# Patient Record
Sex: Female | Born: 1960 | Race: White | Hispanic: No | Marital: Married | Smoking: Never smoker
Health system: Southern US, Community
[De-identification: ages and names within clinical notes are randomized; demographics above are authoritative.]

## PROBLEM LIST (undated history)

## (undated) DIAGNOSIS — D229 Melanocytic nevi, unspecified: Secondary | ICD-10-CM

## (undated) DIAGNOSIS — G8929 Other chronic pain: Secondary | ICD-10-CM

## (undated) DIAGNOSIS — E119 Type 2 diabetes mellitus without complications: Secondary | ICD-10-CM

## (undated) DIAGNOSIS — M5416 Radiculopathy, lumbar region: Secondary | ICD-10-CM

## (undated) DIAGNOSIS — G5790 Unspecified mononeuropathy of unspecified lower limb: Secondary | ICD-10-CM

## (undated) DIAGNOSIS — J449 Chronic obstructive pulmonary disease, unspecified: Secondary | ICD-10-CM

## (undated) DIAGNOSIS — M549 Dorsalgia, unspecified: Secondary | ICD-10-CM

## (undated) DIAGNOSIS — F319 Bipolar disorder, unspecified: Secondary | ICD-10-CM

## (undated) DIAGNOSIS — M51369 Other intervertebral disc degeneration, lumbar region without mention of lumbar back pain or lower extremity pain: Secondary | ICD-10-CM

## (undated) DIAGNOSIS — M1990 Unspecified osteoarthritis, unspecified site: Secondary | ICD-10-CM

## (undated) DIAGNOSIS — M5030 Other cervical disc degeneration, unspecified cervical region: Secondary | ICD-10-CM

## (undated) DIAGNOSIS — L989 Disorder of the skin and subcutaneous tissue, unspecified: Secondary | ICD-10-CM

## (undated) DIAGNOSIS — I10 Essential (primary) hypertension: Secondary | ICD-10-CM

## (undated) DIAGNOSIS — N281 Cyst of kidney, acquired: Secondary | ICD-10-CM

## (undated) DIAGNOSIS — B029 Zoster without complications: Secondary | ICD-10-CM

## (undated) DIAGNOSIS — K828 Other specified diseases of gallbladder: Secondary | ICD-10-CM

## (undated) DIAGNOSIS — E785 Hyperlipidemia, unspecified: Secondary | ICD-10-CM

## (undated) DIAGNOSIS — K219 Gastro-esophageal reflux disease without esophagitis: Secondary | ICD-10-CM

## (undated) DIAGNOSIS — F329 Major depressive disorder, single episode, unspecified: Secondary | ICD-10-CM

## (undated) DIAGNOSIS — F32A Depression, unspecified: Secondary | ICD-10-CM

## (undated) DIAGNOSIS — M542 Cervicalgia: Secondary | ICD-10-CM

## (undated) DIAGNOSIS — M5136 Other intervertebral disc degeneration, lumbar region: Secondary | ICD-10-CM

## (undated) HISTORY — DX: Depression, unspecified: F32.A

## (undated) HISTORY — DX: Chronic obstructive pulmonary disease, unspecified: J44.9

## (undated) HISTORY — DX: Type 2 diabetes mellitus without complications: E11.9

## (undated) HISTORY — DX: Hyperlipidemia, unspecified: E78.5

## (undated) HISTORY — PX: ABDOMINAL HYSTERECTOMY: SHX81

## (undated) HISTORY — DX: Unspecified mononeuropathy of unspecified lower limb: G57.90

## (undated) HISTORY — DX: Essential (primary) hypertension: I10

## (undated) HISTORY — DX: Bipolar disorder, unspecified: F31.9

## (undated) HISTORY — DX: Unspecified osteoarthritis, unspecified site: M19.90

## (undated) HISTORY — DX: Gastro-esophageal reflux disease without esophagitis: K21.9

## (undated) HISTORY — DX: Disorder of the skin and subcutaneous tissue, unspecified: L98.9

## (undated) HISTORY — DX: Other cervical disc degeneration, unspecified cervical region: M50.30

## (undated) HISTORY — DX: Melanocytic nevi, unspecified: D22.9

## (undated) HISTORY — DX: Zoster without complications: B02.9

## (undated) HISTORY — DX: Other intervertebral disc degeneration, lumbar region without mention of lumbar back pain or lower extremity pain: M51.369

## (undated) HISTORY — DX: Other specified diseases of gallbladder: K82.8

## (undated) HISTORY — PX: NECK SURGERY: SHX720

## (undated) HISTORY — DX: Cyst of kidney, acquired: N28.1

## (undated) HISTORY — DX: Major depressive disorder, single episode, unspecified: F32.9

## (undated) HISTORY — DX: Other intervertebral disc degeneration, lumbar region: M51.36

## (undated) HISTORY — PX: BACK SURGERY: SHX140

---

## 2002-07-09 ENCOUNTER — Emergency Department (HOSPITAL_COMMUNITY): Admission: EM | Admit: 2002-07-09 | Discharge: 2002-07-10 | Payer: Self-pay | Admitting: Emergency Medicine

## 2005-02-21 ENCOUNTER — Emergency Department (HOSPITAL_COMMUNITY): Admission: EM | Admit: 2005-02-21 | Discharge: 2005-02-21 | Payer: Self-pay | Admitting: Emergency Medicine

## 2006-10-10 ENCOUNTER — Emergency Department (HOSPITAL_COMMUNITY): Admission: EM | Admit: 2006-10-10 | Discharge: 2006-10-10 | Payer: Self-pay | Admitting: Emergency Medicine

## 2007-06-06 ENCOUNTER — Emergency Department (HOSPITAL_COMMUNITY): Admission: EM | Admit: 2007-06-06 | Discharge: 2007-06-06 | Payer: Self-pay | Admitting: Emergency Medicine

## 2007-07-22 ENCOUNTER — Inpatient Hospital Stay (HOSPITAL_COMMUNITY): Admission: RE | Admit: 2007-07-22 | Discharge: 2007-07-24 | Payer: Self-pay | Admitting: Obstetrics & Gynecology

## 2007-07-22 ENCOUNTER — Encounter: Payer: Self-pay | Admitting: Obstetrics & Gynecology

## 2007-12-10 ENCOUNTER — Emergency Department (HOSPITAL_COMMUNITY): Admission: EM | Admit: 2007-12-10 | Discharge: 2007-12-10 | Payer: Self-pay | Admitting: Emergency Medicine

## 2007-12-24 ENCOUNTER — Encounter
Admission: RE | Admit: 2007-12-24 | Discharge: 2008-01-14 | Payer: Self-pay | Admitting: Physical Medicine and Rehabilitation

## 2008-01-12 ENCOUNTER — Emergency Department (HOSPITAL_COMMUNITY): Admission: EM | Admit: 2008-01-12 | Discharge: 2008-01-12 | Payer: Self-pay | Admitting: Emergency Medicine

## 2008-01-21 ENCOUNTER — Ambulatory Visit (HOSPITAL_COMMUNITY)
Admission: RE | Admit: 2008-01-21 | Discharge: 2008-01-21 | Payer: Self-pay | Admitting: Physical Medicine and Rehabilitation

## 2008-04-21 ENCOUNTER — Emergency Department (HOSPITAL_COMMUNITY): Admission: EM | Admit: 2008-04-21 | Discharge: 2008-04-21 | Payer: Self-pay | Admitting: Emergency Medicine

## 2008-05-26 ENCOUNTER — Ambulatory Visit (HOSPITAL_COMMUNITY): Admission: RE | Admit: 2008-05-26 | Discharge: 2008-05-26 | Payer: Self-pay | Admitting: Obstetrics and Gynecology

## 2008-06-02 ENCOUNTER — Ambulatory Visit (HOSPITAL_COMMUNITY): Admission: RE | Admit: 2008-06-02 | Discharge: 2008-06-03 | Payer: Self-pay | Admitting: Orthopedic Surgery

## 2008-12-08 ENCOUNTER — Inpatient Hospital Stay (HOSPITAL_COMMUNITY): Admission: RE | Admit: 2008-12-08 | Discharge: 2008-12-10 | Payer: Self-pay | Admitting: Orthopedic Surgery

## 2009-02-15 ENCOUNTER — Emergency Department (HOSPITAL_COMMUNITY): Admission: EM | Admit: 2009-02-15 | Discharge: 2009-02-15 | Payer: Self-pay | Admitting: Emergency Medicine

## 2009-02-22 LAB — HM DEXA SCAN

## 2009-04-17 ENCOUNTER — Emergency Department (HOSPITAL_COMMUNITY): Admission: EM | Admit: 2009-04-17 | Discharge: 2009-04-17 | Payer: Self-pay | Admitting: Emergency Medicine

## 2009-05-09 ENCOUNTER — Emergency Department (HOSPITAL_COMMUNITY): Admission: EM | Admit: 2009-05-09 | Discharge: 2009-05-09 | Payer: Self-pay | Admitting: Emergency Medicine

## 2009-06-06 ENCOUNTER — Ambulatory Visit (HOSPITAL_COMMUNITY): Admission: RE | Admit: 2009-06-06 | Discharge: 2009-06-06 | Payer: Self-pay | Admitting: Orthopedic Surgery

## 2009-07-04 ENCOUNTER — Ambulatory Visit (HOSPITAL_COMMUNITY): Admission: RE | Admit: 2009-07-04 | Discharge: 2009-07-04 | Payer: Self-pay | Admitting: Family Medicine

## 2010-01-06 ENCOUNTER — Emergency Department (HOSPITAL_COMMUNITY): Admission: EM | Admit: 2010-01-06 | Discharge: 2010-01-06 | Payer: Self-pay | Admitting: Emergency Medicine

## 2010-01-26 ENCOUNTER — Encounter: Payer: Self-pay | Admitting: Gastroenterology

## 2010-02-19 ENCOUNTER — Ambulatory Visit: Payer: Self-pay | Admitting: Gastroenterology

## 2010-03-05 ENCOUNTER — Ambulatory Visit: Payer: Self-pay | Admitting: Gastroenterology

## 2010-03-05 LAB — HM COLONOSCOPY: HM Colonoscopy: NORMAL

## 2010-03-06 LAB — HM DIABETES EYE EXAM

## 2010-04-13 NOTE — Op Note (Signed)
NAMESHRON, Charlotte Perez              ACCOUNT NO.:  [id number]  MEDICAL RECORD NO.:  [id number]          PATIENT TYPE:  INP  LOCATION:  5010                         FACILITY:  [facility]  PHYSICIAN:  Alvy Beal, MD    DATE OF BIRTH:  02/17/1961  DATE OF PROCEDURE: DATE OF DISCHARGE:                              OPERATIVE REPORT  PREOPERATIVE DIAGNOSIS:  Degenerative lumbar disk disease at L5-S1 with diskogenic low back pain and radicular right leg pain.  POSTOPERATIVE DIAGNOSIS:  Degenerative lumbar disk disease at L5-S1 with diskogenic low back pain and radicular right leg pain.  PROCEDURE:  Transforaminal lumbar interbody fusion at L5-S1.  COMPLICATIONS:  None.  INSTRUMENTATION USED:  Minimally invasive TLIF system with a 10 x 9 interbody cage, packed with Actifuse, 40-mm L5 pedicle screws, 35-mm S1 pedicle screws, evoked running EMGs and neuro monitoring remained normal throughout the case.  FIRST ASSISTANT:  Crissie Reese, PA.  HISTORY:  This is a very pleasant 50 year old woman who presents with complaints of severe debilitating low back pain.  The patient had clinical and radiographic analysis consistent with degenerative lumbar disk disease with diskogenic back pain and radicular right leg pain. After discussing treatment options, she elected to proceed with surgery. All appropriate risks, benefits, and alternatives were discussed and consent was obtained.  OPERATIVE NOTE:  The patient was brought to the operating room and placed supine on the operating table.  After successful induction of general anesthesia and endotracheal intubation, TEDs, SCDs, and Foley were inserted.  The patient was then turned prone onto a Wilson frame. All bony prominences were well padded.  The back was prepped and draped in a standard fashion.  It should be noted that prior to prepping and draping, all appropriate neuro monitoring needles were positioned for intraoperative  neuro monitoring.  Once the patient was prepped and draped, an appropriate time-out was done confirming the appropriate patient, procedure, and the extremity that was affected.  Once this was done, I used x-ray to identify the lateral border of the L5 pedicle.  Once done, I made a stab incision at this well and advanced the percutaneous Jamshidi needle down to the lateral aspect of spine.  I was able to palpate the lateral recess and traced it back up to where it contacted the facet joint.  I confirmed satisfactory position in AP and lateral fluoroscopy.  I advanced the Jamshidi needle through the cortical bone into the pedicle.  On the AP view, I advanced it to the medial wall, so I was near the medial wall of the pedicle and then I took the lateral x-ray.  At this point, I was already at the posterior vertebral body pedicle junction.  I then advanced the probe through the posterior vertebral body.  I then advanced the guide pin down the Jamshidi needle and then removed the Jamshidi needle.  I then repeated this entire procedure at the S1 level. Again using AP and lateral fluoroscopy and neuro monitoring while the Jamshidi was going in, there were no adverse events.  Once both guide pins were properly positioned, I then connected it by a skin incision and  then dissected down to the deep fascia.  I incised the deep fascia in line with the guide pins and then made a relaxing horizontal incision and the fascia at the midpoint between the 2 guide pin.  I then began dissecting the paraspinal muscles, so that I could palpate and visualize the lamina and facet complex.  Once I had adequate mobilized the soft tissue, I then tapped again using neuro monitoring.  Again the tapping under fluoroscopic guidance and with monitoring, demonstrated satisfactory trajectory position.  I then placed the 40-mm screw at L5 which was attached to the 70-mm retractor blade at S1 and it was a same size screw.   These were the temporary 5.5 diameter screws.  Once I had the screws properly positioning confirmed with x-ray in both planes, I then proceeded with the decompression.  I first attached the retracting device to the blades and then secured it via the arm to the table.  Once I had the blades properly positioned, I expanded them in the cranial caudal direction and then I gently distracted.  At this point, I placed my medial retractor blade and retracted the medial soft tissue.  I then used a pituitary rongeur to remove the remaining strands of muscular tissue, so I could expose the lamina and L5-S1 facet complex.  At this point, I had an excellent visualization of the lateral aspect of the posterior spine.  I then took an osteotome, placed it over the pars interarticularis and then gently divided the cortex.  I then went down and split the inferior L5 facet complex with the osteotome and removed that.  At this point, I could visualize the S1 superior facet complex.  I then used a fine curette to develop a plane underneath the lamina of S1 and I performed a S1 laminectomy.  I then at this point resected the ligamentum flavum and I could clearly visualize the lateral aspect of the thecal sac.  I then dissected down underneath the S1 superior facet and removed the osteophytes, so that I could see the medial and superior aspect of the pedicle.  I then went inferiorly and traced the S1 nerve root down to the neural foramen and did a foraminotomy.  At this point, I could clearly take a dural elevator [facility] and pass it in the lateral recess medial to the S1 pedicle and out the neural foramen.  At this point, I then began resecting the remaining portion of the pars interarticularis until I could completely visualize the exiting L5 nerve root.  I then went superiorly and removed the remaining osteophytes, so that I could again visualize the inferior aspect of the L5 pedicle.  At this  point having adequately decompressed laterally and craniocaudally, I had an excellent visualization of the L5-S1 disk space.  I gently resected the thecal sac and coagulated the large epidural veins with bipolar electrocautery.  I then incised the annulus with a 15 blade scalpel and then using a combination of endplate scrapers, pituitary rongeurs, curettes, and Kerrison rongeurs, I removed the entire L5-S1 disk material.  I then using endplate scrapers to rasp the endplates of L5 and S1, so that I had adequate bleeding bone.  At this point, I trialed the 9 and 10 spacer and a 10 gave me adequate fixation.  I then took the bone that I had harvested locally, morselized it and put in the anterior aspect of the interbody space.  I mixed this with some Actifuse.  I then took  the 10 x 9 bulleted TLIF cage, packed with Actifuse and then malleted it to the appropriate depth.  I then pushed it across so that it was midline and sat horizontal in the anterior third of the vertebral body.  At this point, the AP and lateral x-rays demonstrated satisfactory position of the cage and graft material.  With the decompression complete, I then level took the Wilson frame intraoperative, so that there was minimal kyphosis.  At this point, I then measured the space within the rod and placed a 20-mm rods and secured it to the pedicle screws.  Once I did it, I did check the pedicle directly and I was satisfied that I had not breach medially or inferiorly at L5 or medially or superiorly or inferiorly at S1.  At this point, I placed a thrombin-soaked Gelfoam patty over the exposed thecal sac and then closed the deep fascia with interrupted zero Vicryl suture, superficial with 2-0 Vicryl sutures, and 3-0 Monocryl for the skin.  I went to the contralateral side and then used x-ray to identify the lateral border of the L5 pedicle.  Once I was able to identify this border, I made a stab incision, advanced the  Jamshidi down and then I made incision.  I then identified the lateral border of the S1 pedicle and made a second incision.  I connected these 2 and then I dissected down to the deep fascia and incised the deep fascia and then began using a Cobb elevator to split the paraspinal muscles until I could see the L5- S1 facet complex.  I then used a Cobb elevator to scrape the facet capsule from L5-S1.  At this point, I advanced the Jamshidi needle back down and I could easily palpate the lateral mass of L5 and I placed this on the lateral aspect with a facet and transverse process merge.  I then advanced the Jamshidi through the pedicle.  In the AP and lateral planes, I had satisfactory position and neuro monitoring remained within normal limits.  I then placed the guide pin and then removed the Jamshidi needle and repeated this at S1.  Once both needles were in, I then took an osteotome and then roughened up the facet complex at L5-S1.  I then took the remaining autograft bone that I had, mixed with the Actifuse and packed it in the posterolateral and over the facet complex.  I then tapped again using live fluoro in both the AP and lateral planes and neuro monitoring remained normal. Once I tapped, I then placed the screws.  Also, during screw insertion, there was no abnormal neural neurodiagnostic event and based on x-ray had adequate position.  I then measured the rod, placed it and secured it and torqued it down appropriately.  At this point, I then removed the inserting devices, irrigated the wound, closed the deep fascia in a similar fashion with interrupted 0 Vicryl suture, superficial with 2-0 Vicryl sutures, and 3- 0 Monocryl for the skin.  Steri-Strips and dry dressings were placed on the wounds.  The patient was extubated and transferred to PACU without any incident.  At the end of the case, all needle and sponge counts were correct.  There were no adverse neurodiagnostic events  and the hardware appeared satisfactory in both the AP and lateral planes and with direct visualization.     Alvy Beal, MD Electronically Signed    DDB/MEDQ  D:  12/08/2008  T:  12/09/2008  Job:  [id number]  Electronically Signed by Venita Lick MD on 04/12/2010 08:45:56 PM

## 2010-04-24 NOTE — Miscellaneous (Signed)
Summary: DIRECT COLON SCREEN-AGE/YF  Clinical Lists Changes  Medications: Added new medication of TRILYTE 420 GM  SOLR (PEG 3350-KCL-NA BICARB-NACL) As per prep instructions. - Signed Rx of TRILYTE 420 GM  SOLR (PEG 3350-KCL-NA BICARB-NACL) As per prep instructions.;  #1 x 0;  Signed;  Entered by: Clide Cliff RN;  Authorized by: Mardella Layman MD [facility];  Method used: Electronically to [facility] [address], [place], [place], [place]  [address], Ph: [phone], Fax: [phone] Observations: Added new observation of ALLERGY REV: Done (02/19/2010 15:03)    Prescriptions: TRILYTE 420 GM  SOLR (PEG 3350-KCL-NA BICARB-NACL) As per prep instructions.  #1 x 0   Entered by:   Clide Cliff RN   Authorized by:   Mardella Layman MD [facility]   Signed by:   Clide Cliff RN on 02/19/2010   Method used:   Electronically to        [facility]* (retail)       [address]       [place], [place]  [address]       Ph: [phone]       Fax: [phone]   RxID:   [id number]

## 2010-04-24 NOTE — Letter (Signed)
Summary: Pre Visit Letter Revised  [facility]  [address] [place], [place] [address]   Phone: [phone]  Fax: [phone]        01/26/2010 MRN: [id number]  Charlotte Perez [address] [place], [place]  [address]             Procedure Date:  12-12 at 11:30am            Dr Jarold Motto   Welcome to the Gastroenterology Division at [facility].    You are scheduled to see a nurse for your pre-procedure visit on 02-19-10 at 4pm on the 3rd floor at [facility], [address]  We ask that you try to arrive at our office 15 minutes prior to your appointment time to allow for check-in.  Please take a minute to review the attached form.  If you answer "Yes" to one or more of the questions on the first page, we ask that you call the person listed at your earliest opportunity.  If you answer "No" to all of the questions, please complete the rest of the form and bring it to your appointment.    Your nurse visit will consist of discussing your medical and surgical history, your immediate family medical history, and your medications.   If you are unable to list all of your medications on the form, please bring the medication bottles to your appointment and we will list them.  We will need to be aware of both prescribed and over the counter drugs.  We will need to know exact dosage information as well.    Please be prepared to read and sign documents such as consent forms, a financial agreement, and acknowledgement forms.  If necessary, and with your consent, a friend or relative is welcome to sit-in on the nurse visit with you.  Please bring your insurance card so that we may make a copy of it.  If your insurance requires a referral to see a specialist, please bring your referral form from your primary care physician.  No co-pay is required for this nurse visit.     If you cannot keep your appointment, please call [phone] to cancel or reschedule prior to your  appointment date.  This allows [place] the opportunity to schedule an appointment for another patient in need of care.    Thank you for choosing [facility] for your medical needs.  We appreciate the opportunity to care for you.  Please visit [place] at our website  to learn more about our practice.  Sincerely, The Gastroenterology Division

## 2010-04-26 NOTE — Procedures (Signed)
Summary: Colonoscopy  Patient: Charlotte Perez Note: All result statuses are Final unless otherwise noted.  Tests: (1) Colonoscopy (COL)   COL Colonoscopy           DONE     [facility]     [address]     [place], [place]  [address]           COLONOSCOPY PROCEDURE REPORT           PATIENT:  Charlotte Perez, Charlotte Perez  MR#:  [id number]     BIRTHDATE:  10-20-1960, 49 yrs. old  GENDER:  female     ENDOSCOPIST:  Vania Rea. Jarold Motto, MD, [facility]     REF. BY:  Rudi Heap, M.D.     PROCEDURE DATE:  03/05/2010     PROCEDURE:  Higher-risk screening colonoscopy G0105           ASA CLASS:  Class II     INDICATIONS:  Elevated Risk Screening, family history of colon     cancer, family Hx of polyps     MEDICATIONS:   Fentanyl 50 mcg IV, Versed 6 mg IV           DESCRIPTION OF PROCEDURE:   After the risks benefits and     alternatives of the procedure were thoroughly explained, informed     consent was obtained.  Digital rectal exam was performed and     revealed no abnormalities.   The LB 180AL [id number] endoscope was     introduced through the anus and advanced to the cecum, which was     identified by both the appendix and ileocecal valve, without     limitations.  The quality of the prep was Moviprep fair.  The     instrument was then slowly withdrawn as the colon was fully     examined.<<PROCEDUREIMAGES>>           FINDINGS:  No polyps or cancers were seen.  This was otherwise a     normal examination of the colon.   Retroflexed views in the rectum     revealed no abnormalities.    The scope was then withdrawn from     the patient and the procedure completed.           COMPLICATIONS:  None     ENDOSCOPIC IMPRESSION:     1) No polyps or cancers     2) Otherwise normal examination     RECOMMENDATIONS:     1) Given your significant family history of colon cancer, you     should have a repeat colonoscopy in 5 years     REPEAT EXAM:  No           ______________________________  Vania Rea. Jarold Motto, MD, [facility]           CC:           n.     eSIGNED:   Vania Rea. Patterson at 03/05/2010 12:14 PM           Satira Sark, [id number]  Note: An exclamation mark (!) indicates a result that was not dispersed into the flowsheet. Document Creation Date: 03/05/2010 12:14 PM _______________________________________________________________________  (1) Order result status: Final Collection or observation date-time: 03/05/2010 12:09 Requested date-time:  Receipt date-time:  Reported date-time:  Referring Physician:   Ordering Physician: Sheryn Bison [phone]) Specimen Source:  Source: Launa Grill Order Number: [id number] Lab site:   Appended Document: Colonoscopy    Clinical Lists Changes  Observations: Added new observation of COLONNXTDUE: 02/2015 (03/05/2010 15:31)      Appended Document: Colonoscopy recall     Procedures Next Due Date:    Colonoscopy: 03/2015  Appended Document: Colonoscopy Recall     Procedures Next Due Date:    Colonoscopy: 03/2015

## 2010-05-29 ENCOUNTER — Emergency Department (HOSPITAL_COMMUNITY)
Admission: EM | Admit: 2010-05-29 | Discharge: 2010-05-29 | Disposition: A | Discharge status: Home / Self Care | Payer: Medicaid Other | Attending: Emergency Medicine | Admitting: Emergency Medicine

## 2010-05-29 DIAGNOSIS — R51 Headache: Secondary | ICD-10-CM | POA: Insufficient documentation

## 2010-05-29 DIAGNOSIS — E119 Type 2 diabetes mellitus without complications: Secondary | ICD-10-CM | POA: Insufficient documentation

## 2010-05-29 DIAGNOSIS — M542 Cervicalgia: Secondary | ICD-10-CM | POA: Insufficient documentation

## 2010-05-29 DIAGNOSIS — Z79899 Other long term (current) drug therapy: Secondary | ICD-10-CM | POA: Insufficient documentation

## 2010-05-29 DIAGNOSIS — F3130 Bipolar disorder, current episode depressed, mild or moderate severity, unspecified: Secondary | ICD-10-CM | POA: Insufficient documentation

## 2010-05-29 DIAGNOSIS — J45909 Unspecified asthma, uncomplicated: Secondary | ICD-10-CM | POA: Insufficient documentation

## 2010-05-29 DIAGNOSIS — G8929 Other chronic pain: Secondary | ICD-10-CM | POA: Insufficient documentation

## 2010-06-05 LAB — GLUCOSE, CAPILLARY
Glucose-Capillary: 104 mg/dL — ABNORMAL HIGH (ref 70–99)
Glucose-Capillary: 93 mg/dL (ref 70–99)

## 2010-06-06 LAB — CBC
HCT: 33.3 % — ABNORMAL LOW (ref 36.0–46.0)
Hemoglobin: 11.5 g/dL — ABNORMAL LOW (ref 12.0–15.0)
MCH: 31 pg (ref 26.0–34.0)
MCHC: 34.5 g/dL (ref 30.0–36.0)
MCV: 90.1 fL (ref 78.0–100.0)
Platelets: 182 10*3/uL (ref 150–400)
RBC: 3.7 MIL/uL — ABNORMAL LOW (ref 3.87–5.11)
RDW: 12.8 % (ref 11.5–15.5)
WBC: 6.1 10*3/uL (ref 4.0–10.5)

## 2010-06-06 LAB — COMPREHENSIVE METABOLIC PANEL
ALT: 13 U/L (ref 0–35)
Albumin: 3.5 g/dL (ref 3.5–5.2)
Alkaline Phosphatase: 34 U/L — ABNORMAL LOW (ref 39–117)
Chloride: 102 mEq/L (ref 96–112)
Glucose, Bld: 111 mg/dL — ABNORMAL HIGH (ref 70–99)
Potassium: 4.4 mEq/L (ref 3.5–5.1)
Sodium: 136 mEq/L (ref 135–145)
Total Protein: 6.1 g/dL (ref 6.0–8.3)

## 2010-06-06 LAB — RAPID URINE DRUG SCREEN, HOSP PERFORMED
Amphetamines: NOT DETECTED
Barbiturates: NOT DETECTED
Benzodiazepines: NOT DETECTED
Cocaine: NOT DETECTED
Opiates: NOT DETECTED
Tetrahydrocannabinol: NOT DETECTED

## 2010-06-06 LAB — GLUCOSE, CAPILLARY: Glucose-Capillary: 104 mg/dL — ABNORMAL HIGH (ref 70–99)

## 2010-06-06 LAB — DIFFERENTIAL
Basophils Relative: 1 % (ref 0–1)
Eosinophils Absolute: 0.1 10*3/uL (ref 0.0–0.7)
Monocytes Absolute: 0.6 10*3/uL (ref 0.1–1.0)
Monocytes Relative: 9 % (ref 3–12)

## 2010-06-10 LAB — URINALYSIS, ROUTINE W REFLEX MICROSCOPIC
Bilirubin Urine: NEGATIVE
Glucose, UA: NEGATIVE mg/dL
Hgb urine dipstick: NEGATIVE
Ketones, ur: NEGATIVE mg/dL
pH: 7 (ref 5.0–8.0)

## 2010-06-13 LAB — URINE CULTURE

## 2010-06-13 LAB — COMPREHENSIVE METABOLIC PANEL
Alkaline Phosphatase: 46 U/L (ref 39–117)
BUN: 16 mg/dL (ref 6–23)
Calcium: 8.6 mg/dL (ref 8.4–10.5)
Glucose, Bld: 108 mg/dL — ABNORMAL HIGH (ref 70–99)
Potassium: 4 mEq/L (ref 3.5–5.1)
Total Protein: 6.1 g/dL (ref 6.0–8.3)

## 2010-06-13 LAB — URINALYSIS, ROUTINE W REFLEX MICROSCOPIC
Glucose, UA: NEGATIVE mg/dL
Nitrite: NEGATIVE
Protein, ur: NEGATIVE mg/dL
Urobilinogen, UA: 0.2 mg/dL (ref 0.0–1.0)

## 2010-06-13 LAB — DIFFERENTIAL
Basophils Relative: 0 % (ref 0–1)
Monocytes Relative: 2 % — ABNORMAL LOW (ref 3–12)
Neutro Abs: 7.4 10*3/uL (ref 1.7–7.7)
Neutrophils Relative %: 91 % — ABNORMAL HIGH (ref 43–77)

## 2010-06-13 LAB — CBC
HCT: 39.7 % (ref 36.0–46.0)
Hemoglobin: 13.7 g/dL (ref 12.0–15.0)
MCHC: 34.5 g/dL (ref 30.0–36.0)
RDW: 13 % (ref 11.5–15.5)

## 2010-06-13 LAB — LIPASE, BLOOD: Lipase: 17 U/L (ref 11–59)

## 2010-06-27 DIAGNOSIS — E1169 Type 2 diabetes mellitus with other specified complication: Secondary | ICD-10-CM | POA: Insufficient documentation

## 2010-06-27 DIAGNOSIS — I10 Essential (primary) hypertension: Secondary | ICD-10-CM | POA: Insufficient documentation

## 2010-06-27 DIAGNOSIS — E119 Type 2 diabetes mellitus without complications: Secondary | ICD-10-CM

## 2010-06-27 DIAGNOSIS — E1165 Type 2 diabetes mellitus with hyperglycemia: Secondary | ICD-10-CM | POA: Insufficient documentation

## 2010-06-27 DIAGNOSIS — E1159 Type 2 diabetes mellitus with other circulatory complications: Secondary | ICD-10-CM | POA: Insufficient documentation

## 2010-06-27 DIAGNOSIS — E785 Hyperlipidemia, unspecified: Secondary | ICD-10-CM

## 2010-06-27 DIAGNOSIS — F319 Bipolar disorder, unspecified: Secondary | ICD-10-CM

## 2010-06-27 HISTORY — DX: Type 2 diabetes mellitus without complications: E11.9

## 2010-06-29 LAB — BASIC METABOLIC PANEL
BUN: 5 mg/dL — ABNORMAL LOW (ref 6–23)
BUN: 5 mg/dL — ABNORMAL LOW (ref 6–23)
BUN: 12 mg/dL (ref 6–23)
CO2: 30 mEq/L (ref 19–32)
Chloride: 100 mEq/L (ref 96–112)
Chloride: 101 mEq/L (ref 96–112)
Creatinine, Ser: 0.59 mg/dL (ref 0.4–1.2)
Creatinine, Ser: 0.58 mg/dL (ref 0.4–1.2)
GFR calc non Af Amer: >60 mL/min (ref >60)
Glucose, Bld: 149 mg/dL — ABNORMAL HIGH (ref 70–99)
Glucose, Bld: 188 mg/dL — ABNORMAL HIGH (ref 70–99)
Glucose, Bld: 133 mg/dL — ABNORMAL HIGH (ref 70–99)
Potassium: 3.7 mEq/L (ref 3.5–5.1)

## 2010-06-29 LAB — GLUCOSE, CAPILLARY
Glucose-Capillary: 129 mg/dL — ABNORMAL HIGH (ref 70–99)
Glucose-Capillary: 142 mg/dL — ABNORMAL HIGH (ref 70–99)
Glucose-Capillary: 144 mg/dL — ABNORMAL HIGH (ref 70–99)
Glucose-Capillary: 151 mg/dL — ABNORMAL HIGH (ref 70–99)
Glucose-Capillary: 177 mg/dL — ABNORMAL HIGH (ref 70–99)

## 2010-06-29 LAB — CBC
HCT: 27.8 % — ABNORMAL LOW (ref 36.0–46.0)
HCT: 30.5 % — ABNORMAL LOW (ref 36.0–46.0)
Hemoglobin: 10.6 g/dL — ABNORMAL LOW (ref 12.0–15.0)
MCHC: 35 g/dL (ref 30.0–36.0)
MCV: 91.4 fL (ref 78.0–100.0)
MCV: 91.5 fL (ref 78.0–100.0)
MCV: 91.9 fL (ref 78.0–100.0)
Platelets: 128 10*3/uL — ABNORMAL LOW (ref 150–400)
Platelets: 132 10*3/uL — ABNORMAL LOW (ref 150–400)
Platelets: 195 10*3/uL (ref 150–400)
RDW: 12.6 % (ref 11.5–15.5)
RDW: 12.7 % (ref 11.5–15.5)
RDW: 13.1 % (ref 11.5–15.5)
WBC: 5.7 10*3/uL (ref 4.0–10.5)

## 2010-06-29 LAB — ABO/RH: ABO/RH(D): B POS

## 2010-06-29 LAB — TYPE AND SCREEN: ABO/RH(D): B POS

## 2010-07-05 LAB — BASIC METABOLIC PANEL
BUN: 10 mg/dL (ref 6–23)
BUN: 3 mg/dL — ABNORMAL LOW (ref 6–23)
CO2: 32 mEq/L (ref 19–32)
Chloride: 103 mEq/L (ref 96–112)
Chloride: 106 mEq/L (ref 96–112)
Creatinine, Ser: 0.57 mg/dL (ref 0.4–1.2)
Creatinine, Ser: 0.62 mg/dL (ref 0.4–1.2)
Glucose, Bld: 120 mg/dL — ABNORMAL HIGH (ref 70–99)
Glucose, Bld: 138 mg/dL — ABNORMAL HIGH (ref 70–99)
Potassium: 4 mEq/L (ref 3.5–5.1)
Potassium: 4.3 mEq/L (ref 3.5–5.1)

## 2010-07-05 LAB — DIFFERENTIAL
Basophils Absolute: 0.1 10*3/uL (ref 0.0–0.1)
Basophils Relative: 1 % (ref 0–1)
Eosinophils Absolute: 0 10*3/uL (ref 0.0–0.7)
Eosinophils Relative: 0 % (ref 0–5)
Lymphs Abs: 1.5 10*3/uL (ref 0.7–4.0)
Neutrophils Relative %: 63 % (ref 43–77)

## 2010-07-05 LAB — CBC
HCT: 37.8 % (ref 36.0–46.0)
HCT: 38.5 % (ref 36.0–46.0)
MCHC: 35.7 g/dL (ref 30.0–36.0)
MCV: 93.1 fL (ref 78.0–100.0)
MCV: 93.1 fL (ref 78.0–100.0)
Platelets: 200 10*3/uL (ref 150–400)
Platelets: 214 10*3/uL (ref 150–400)
RDW: 13.4 % (ref 11.5–15.5)
RDW: 13.5 % (ref 11.5–15.5)
WBC: 6 10*3/uL (ref 4.0–10.5)
WBC: 7.8 10*3/uL (ref 4.0–10.5)

## 2010-07-05 LAB — GLUCOSE, CAPILLARY
Glucose-Capillary: 113 mg/dL — ABNORMAL HIGH (ref 70–99)
Glucose-Capillary: 137 mg/dL — ABNORMAL HIGH (ref 70–99)
Glucose-Capillary: 168 mg/dL — ABNORMAL HIGH (ref 70–99)

## 2010-07-09 LAB — URINALYSIS, ROUTINE W REFLEX MICROSCOPIC
Glucose, UA: 250 mg/dL — AB
Hgb urine dipstick: NEGATIVE
Ketones, ur: NEGATIVE mg/dL
Protein, ur: NEGATIVE mg/dL
Urobilinogen, UA: 0.2 mg/dL (ref 0.0–1.0)

## 2010-07-09 LAB — CBC
HCT: 40.7 % (ref 36.0–46.0)
MCHC: 34.9 g/dL (ref 30.0–36.0)
MCV: 93.2 fL (ref 78.0–100.0)
Platelets: 198 10*3/uL (ref 150–400)
RBC: 4.36 MIL/uL (ref 3.87–5.11)
WBC: 5.3 10*3/uL (ref 4.0–10.5)

## 2010-07-09 LAB — DIFFERENTIAL
Basophils Relative: 1 % (ref 0–1)
Eosinophils Absolute: 0 10*3/uL (ref 0.0–0.7)
Eosinophils Relative: 0 % (ref 0–5)
Lymphs Abs: 1.2 10*3/uL (ref 0.7–4.0)
Monocytes Relative: 9 % (ref 3–12)

## 2010-07-09 LAB — BASIC METABOLIC PANEL
BUN: 14 mg/dL (ref 6–23)
CO2: 27 mEq/L (ref 19–32)
Chloride: 102 mEq/L (ref 96–112)
Creatinine, Ser: 0.63 mg/dL (ref 0.4–1.2)
Potassium: 3.4 mEq/L — ABNORMAL LOW (ref 3.5–5.1)

## 2010-07-09 LAB — GLUCOSE, CAPILLARY

## 2010-07-20 ENCOUNTER — Other Ambulatory Visit: Payer: Self-pay | Admitting: Family Medicine

## 2010-07-20 DIAGNOSIS — Z139 Encounter for screening, unspecified: Secondary | ICD-10-CM

## 2010-07-26 ENCOUNTER — Ambulatory Visit (HOSPITAL_COMMUNITY): Payer: Medicaid Other

## 2010-08-07 ENCOUNTER — Ambulatory Visit (HOSPITAL_COMMUNITY)
Admission: RE | Admit: 2010-08-07 | Discharge: 2010-08-07 | Disposition: A | Discharge status: Home / Self Care | Payer: Medicaid Other | Source: Ambulatory Visit | Attending: Family Medicine | Admitting: Family Medicine

## 2010-08-07 ENCOUNTER — Other Ambulatory Visit: Payer: Self-pay | Admitting: Family Medicine

## 2010-08-07 DIAGNOSIS — Z139 Encounter for screening, unspecified: Secondary | ICD-10-CM

## 2010-08-07 DIAGNOSIS — N644 Mastodynia: Secondary | ICD-10-CM

## 2010-08-07 NOTE — Op Note (Signed)
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   OPERATIVE PROCEDURE:  Anterior cervical diskectomy and fusion C5-6.   INSTRUMENTATION USED:  Synthes vector cervical plate 40 mm in length  with 14-mm variable screws, 7-mm precut fibular allograft strut, tacked  with DBX.   COMPLICATIONS:  None.   CONDITION:  Stable.   ESTIMATED BLOOD LOSS:  Minimal.   FIRST ASSISTANT:  Crissie Reese, PA   HISTORY:  This is a very pleasant 50 year old woman who was complaining  of severe right arm and neck pain for some time now.  Despite  appropriate physiotherapy, injection therapy, narcotic, and nonnarcotic  painkillers, the patient's symptoms persist.  As a result, she presented  to me and we discussed further treatment options.  After discussing all  appropriate risks, benefits, and alternatives to surgery, she consented  to the aforementioned procedure.   OPERATIVE NOTE:  The patient was brought to the operating room, placed  supine on the operating table.  After successful induction of general  anesthesia and endotracheal intubation, TEDs and SCDs were applied.  Rolled towels were placed between the shoulder blades and the neck was  prepped and draped in the standard fashion.  A transverse incision was  made just at the level of the inferior aspect of the cricoid cartilage  and sharp dissection was carried out down to and through the platysma.  We then bluntly dissected through the prevertebral fascia, mobilizing  the  trachea and esophagus medially.  I then was able to palpate the  anterior aspect of the cervical spine.  I placed a appendiceal retractor  to protect and retract the trachea and esophagus and visualize the  carotid sheath and protected it laterally with my finger.  I then used a  peanut dissector to remove the remaining prevertebral fascia to  completely expose the anterior longitudinal ligament.  I then placed a  needle into the C5-6 disk space and took an intraoperative x-ray.  The  shoulders were pushed down using a Zimmer cervical visualization system.  This allowed for intermittent shoulder pressure for x-ray visualization.  Once I confirmed I was at the C5-6 level, I removed the axial load to  the shoulders and continued with the surgery.   Using bipolar electrocautery, I mobilized the longus colli muscles from  the midbody of C5 to the midbody of C6.  I then elevated the longus  colli muscles, placed the shadow-line Caspar retractor blades beneath  them and then deflated the endotracheal cuff.  I then expanded the  retractors and reinflated the endotracheal cuff.   I placed distraction pins into the bodies of  C5 and C6 and it was gently  distracted at 5-6 disk space.   A 15-blade scalpel was used to incise the annulus, then using a  combination of pituitary rongeurs, curettes, and Kerrison rongeurs, I  resected the entire disk material.   Using a micro curette, I developed a plane behind the posterior  longitudinal ligament.  I then used a micro nerve hook to fully develop  this plane between the underlying thecal sac and the posterior  longitudinal ligament.  Using a 1-mm Kerrison rongeur, I resected the  posterior longitudinal ligament in its entirety.  At this point, I swept  out the right side as this was more a symptomatic side and then I placed  a 1-mm Kerrison underneath the uncovertebral joint and trimmed out the  bone spurs that were formed on the uncovertebral joint.   Once I had an  adequate decompression, I then rasped the endplates, ensuring down to  bleeding subchondral bone.   I irrigated the wound copiously with normal saline to remove any loose  fragments or material and then measured the interbody space.  I took a  precut 7-mm lordotic fibular graft tacked it with DBX, malleted to the  appropriate depth.  I confirmed satisfactory position and then I placed  a 14-mm anterior cervical plate.  During the course of applying, it did  tilt slightly to the right; however, had excellent purchase in all 4-  screw holes.  I elected to leave the plate even though was slightly  tilted because I was getting adequate purchase.  I then removed the  remaining retractors, checked to ensure the esophagus was not entrapped  beneath the plate, and then I irrigated copiously with normal saline.  Obtained hemostasis using bipolar electrocautery, returned the trachea  and esophagus back to midline, and closed the platysma with interrupted  2-0 Vicryl sutures and then closed the skin with 3-0 Monocryl.  Steri-  Strips, dry dressing, and Aspen collar were applied.  The patient was  extubated and transferred to the PACU without incident.  At end of the  case, all needle and sponge counts were correct.  The patient was  transferred to PACU without incident.      Alvy Beal, MD  Electronically Signed     DDB/MEDQ  D:  06/02/2008  T:  06/03/2008  Job:  [id number]

## 2010-08-07 NOTE — Op Note (Signed)
[name], Charlotte Perez              ACCOUNT NO.:  [id number]   MEDICAL RECORD NO.:  [id number]          PATIENT TYPE:  INP   LOCATION:  A335                          FACILITY:  [facility]   PHYSICIAN:  Lazaro Arms, M.D.   DATE OF BIRTH:  May 30, 1960   DATE OF PROCEDURE:  07/22/2007  DATE OF DISCHARGE:                               OPERATIVE REPORT   PREOPERATIVE DIAGNOSES:  1. Menometrorrhagia.  2. Dysmenorrhea.  3. Chronic pelvic pain.   POSTOPERATIVE DIAGNOSES:  1. Menometrorrhagia.  2. Dysmenorrhea.  3. Chronic pelvic pain.   PROCEDURE:  Total abdominal hysterectomy and bilateral salpingo-  oophorectomy.   SURGEON:  Lazaro Arms, MD   ANESTHESIA:  General endotracheal.   FINDINGS:  The patient had what appeared to be normal uterus, tubes, and  ovaries.  She did not have really any adhesive disease.  The pelvis was  normal.  The abdominal cavity was normal.  She did not have any anterior  abdominal wall adhesions.  The adnexa was normal.  Everything palpated  perfectly normal.   DESCRIPTION OF OPERATION:  The patient was taken to the operating room,  placed in the supine position where she underwent general endotracheal  anesthesia.  The vagina was prepped.  Foley catheter was placed.  The  abdomen was prepped and draped in the usual sterile fashion.  She had  had a previous vertical incision, __________ that incision vertically in  the midline entered the incision down into the fascia, incised the  fascia, entered the peritoneal cavity without difficulty.  An Alexis  self-retaining wound retractor was then used and placed into the  peritoneal cavity for retraction.  The upper abdomen was then packed  away and the patient was placed in Trendelenburg position.  The uterine  cornua were grasped.  The round ligaments were suture ligated and cut.  The vesicouterine serosal flap was created on the right.  The  infundibulopelvic ligament on the left was clamped, cut, and  suture  ligated and the ovary was removed.  The right round ligament was suture  ligated and cut.  The infundibulopelvic ligament on the right was  isolated.  It was clamped,cut, and suture ligated.  The vesicouterine  serosal flap was created and the bladder was taken sharply off lower  uterine segment without difficulty.  The uterine vessels were clamped,  cut, and suture ligated.  Serial pedicles were taken down the cervix to  the cardinal ligaments, each pedicle being clamped, cut, and suture  ligated.  The vagina was then cross clamped and specimen was removed.  The vaginal angle sutures were placed.  The vagina was closed with  interrupted figure-of-eight sutures.  The pelvis was irrigated  vigorously.  All pedicles were found to be hemostatic.  Interceed was  placed over the vaginal cuff.  The Alexis self-retaining retractor was  removed.  The patch removed and all counts were correct.  The  subcutaneous fat,  fascia, muscle, and peritoneum were closed in a  modified Smead-Jones fashion using 0 PDS loop  without difficulty.  The subcutaneous tissue was irrigated, made  hemostatic.  The skin was closed using skin staples.  Patient tolerated  the procedure well.  She experienced 150 cc of blood loss.  She was  taken to the recovery room in good and stable condition.  All counts  were correct x 3.      Lazaro Arms, M.D.  Electronically Signed     LHE/MEDQ  D:  07/22/2007  T:  07/22/2007  Job:  [id number]

## 2010-08-07 NOTE — H&P (Signed)
[name], Perez              ACCOUNT NO.:  [id number]   MEDICAL RECORD NO.:  [id number]          PATIENT TYPE:  AMB   LOCATION:  SDS                          FACILITY:  [facility]   PHYSICIAN:  Alvy Beal, MD    DATE OF BIRTH:  04-26-1960   DATE OF ADMISSION:  DATE OF DISCHARGE:                              HISTORY & PHYSICAL   The patient is to undergo an anterior cervical diskectomy and fusion at  C5-6 by Dr. Venita Lick on Thursday June 02, 2008.   CHIEF COMPLAINT:  Neck pain.   HISTORY OF PRESENT ILLNESS:  [name] is a very pleasant 50 year old  female who has been seen and treated in our office for some time.  She  is having some ongoing moderate to severe neck pain.  She is currently  disabled secondary to her bipolar disease and lack of education.  She  notes that the pain in her neck has progressively gotten worse, and  unfortunately despite all attempts at conservative management consisting  of physical therapy, injection therapy and the pain medical management,  she states that her quality of life is severely impacted on a daily  basis because of neck pain.  Therefore, Dr. Shon Baton felt that the last  option that she has is to undergo an anterior cervical diskectomy and  fusion at the C5, 6, 7 level.   PAST SURGICAL HISTORY:  Includes:  1. C-section x2.  2. Hysterectomy.   PAST MEDICAL HISTORY:  Includes:  1. Bipolar depression.  2. History of migraines and asthma.  3. GERD.  4. Hemorrhoids.  5. Diabetes.   CURRENT MEDICATIONS:  Include:  1. Metformin 500 mg.  2. Effexor XR 150 mg.  3. Actos 15 mg.  4. Maxaron Forte capsules.  5. Lisinopril 10 mg.  6. Albuterol inhaler as needed.  7. Advair as needed.  8. Estrogen cream.   SOCIAL HISTORY:  She is a nonsmoker and a nondrinker.  She is currently  married.  Lives in a 2-level home with her husband.   REVIEW OF SYSTEMS:  GENERAL:  Is positive for night sweats and fatigue.  Otherwise negative for  fevers, chills, weight loss or memory loss.  HEENT/NEUROLOGICAL:  Is positive for headaches and some mild ringing in  the ears.  Otherwise negative for blurred vision, double vision,  dizziness, hearing loss, paralysis, weakness, tremor, blackout spells,  insomnia, balance problems or dentures.  DERMATOLOGIC:  Was otherwise  negative for any rashes, itching, hives, lesions or eczema.  RESPIRATORY:  Negative for any shortness of breath at rest, cough,  wheezing, coughing up blood, or allergies.  CARDIOVASCULAR:  Negative  for chest pain, palpitations, difficulty breathing, swelling or murmurs.  GASTROINTESTINAL:  Positive for constipation and heartburn.  Otherwise  negative for any nausea, vomiting, diarrhea, blood in stool, jaundice,  difficulty swallowing, loss of appetite or abdominal pain.  GENITOURINARY:  Negative for painful urination, urinary frequency, blood  in urine, incontinence, urinary discharge, weak stream, urinating at  night, flank pain, urgency or her urinary retention.  MUSCULOSKELETAL:  Positive for back and neck pain.  Otherwise  negative for joint pain,  joint swelling, spasms, morning stiffness or any other muscular  weakness.   PHYSICAL EXAMINATION:  On physical exam today, Charlotte Perez is a very  pleasant 50 year old female.  She is alert and oriented x3 in no acute  distress.  Cranial nerves II-XII are grossly intact.  HEENT:  Head is atraumatic and normocephalic.  Eyes are PERRL.  Throat  was negative for any erythema or exudate.  Trachea was midline.  There  is no increase in thyroid size.  She does have significant neck pain  with the range of motion, especially with lateral turning.  She has  negative Spurling sign.  No focal motor deficits in the upper extremity.  LUNGS:  Lungs were clear to auscultation.  No wheezing or crackles.  CARDIOVASCULAR:  Regular rate and rhythm.  No murmurs, rubs, gallops  and/or clicks.  ABDOMEN:  Abdomen was soft and nontender.   Bowel sounds were present x4.  There is no rebound tenderness.  No guarding and no masses.  Liver and  spleen size are within normal limits.  MUSCULOSKELETAL:  The patient did have pain with some back range of  motion; however, as far as her right upper extremities are concerned,  isolated shoulder, elbow and wrist range of motion cause no increased  pain to her neck.  Deep tendon reflexes are 2+ and symmetrical.  She has  negative Hoffman sign.  Distal pulses are intact.  Sensations are intact  to light touch.  She has no focal or motor neurological deficits in the  upper extremities.   ASSESSMENT/PLAN:  Assessment is cervical degenerative disk disease.  The  plan is for her to undergo an anterior cervical diskectomy and fusion at  the C5-6 level to be done by Dr. Shon Baton on June 02, 2008, at [facility].  Anticipated hospital length of stay is overnight.      Crissie Reese, PA      Alvy Beal, MD  Electronically Signed    AC/MEDQ  D:  05/31/2008  T:  05/31/2008  Job:  [id number]   cc:   Alvy Beal, MD

## 2010-08-07 NOTE — Discharge Summary (Signed)
Charlotte Perez, Charlotte Perez              ACCOUNT NO.:  [id number]   MEDICAL RECORD NO.:  [id number]          PATIENT TYPE:  INP   LOCATION:  A335                          FACILITY:  [facility]   PHYSICIAN:  Lazaro Arms, M.D.   DATE OF BIRTH:  12-16-1960   DATE OF ADMISSION:  07/22/2007  DATE OF DISCHARGE:  05/01/2009LH                               DISCHARGE SUMMARY   DISCHARGE DIAGNOSIS:  1. Status post total abdominal hysterectomy and bilateral salpingo-      oophorectomy.  2. Unremarkable postoperative course.   PROCEDURE:  Total abdominal hysterectomy and bilateral salpingo-  oophorectomy.   Please refer to the history and physical and the operative report for  details of admission to the hospital.   HOSPITAL COURSE:  The patient was admitted postoperatively.  She did  quite well.  Intraoperative course was unremarkable.  Please see the  operative note for details.  She tolerated clear liquids and a regular  diet.  She voided without symptoms, was extensively ambulatory.  She had  progression with bowel function with flatus.  No bowel movement at the  time of discharge.  Her abdominal exam was benign.  Her incision was  clean, dry, and intact.  She remained afebrile throughout the postop  course.  On postop day 1, hemoglobin was 10.5, and postop #2 was 9.7  with the white count down to 5800.  She was discharged to home in the  morning of postop day #2 in good stable condition.  Follow up in the  office next week is scheduled to have her incision evaluated.  She was  discharged to home on Percocet as needed for pain, Ambien at night for  sleep as needed, and also Vivelle-Dot patches 0.1 mg.  She will be seen  next week for followup.      Lazaro Arms, M.D.  Electronically Signed     LHE/MEDQ  D:  07/24/2007  T:  07/24/2007  Job:  [id number]

## 2010-08-15 ENCOUNTER — Ambulatory Visit (HOSPITAL_COMMUNITY)
Admission: RE | Admit: 2010-08-15 | Discharge: 2010-08-15 | Disposition: A | Discharge status: Home / Self Care | Payer: Medicaid Other | Source: Ambulatory Visit | Attending: Family Medicine | Admitting: Family Medicine

## 2010-08-15 DIAGNOSIS — N644 Mastodynia: Secondary | ICD-10-CM | POA: Insufficient documentation

## 2010-08-29 ENCOUNTER — Encounter: Payer: Self-pay | Admitting: Physician Assistant

## 2010-12-17 LAB — CBC
HCT: 26.9 — ABNORMAL LOW
Hemoglobin: 8.8 — ABNORMAL LOW
MCHC: 32.8
MCV: 66.2 — ABNORMAL LOW
RBC: 4.06

## 2010-12-17 LAB — DIFFERENTIAL
Basophils Relative: 1
Eosinophils Relative: 1
Monocytes Absolute: 0.5
Neutro Abs: 2.6
Neutrophils Relative %: 55

## 2010-12-17 LAB — URINALYSIS, ROUTINE W REFLEX MICROSCOPIC
Bilirubin Urine: NEGATIVE
Nitrite: NEGATIVE
Protein, ur: NEGATIVE
Urobilinogen, UA: 0.2

## 2010-12-17 LAB — BASIC METABOLIC PANEL
CO2: 25
Chloride: 108
GFR calc Af Amer: >60
Potassium: 4
Sodium: 137

## 2010-12-18 LAB — DIFFERENTIAL
Basophils Absolute: 0
Eosinophils Absolute: 0.1
Eosinophils Relative: 1

## 2010-12-18 LAB — COMPREHENSIVE METABOLIC PANEL
AST: 14
CO2: 25
Calcium: 9.4
Creatinine, Ser: 0.54
GFR calc Af Amer: >60
GFR calc non Af Amer: >60
Glucose, Bld: 107 — ABNORMAL HIGH

## 2010-12-18 LAB — URINALYSIS, ROUTINE W REFLEX MICROSCOPIC
Bilirubin Urine: NEGATIVE
Ketones, ur: NEGATIVE
Nitrite: NEGATIVE
Protein, ur: NEGATIVE
Urobilinogen, UA: 0.2

## 2010-12-18 LAB — CBC
HCT: 30.3 — ABNORMAL LOW
MCHC: 34.7
MCV: 79.6
MCV: 81.4
Platelets: 174
RBC: 4.17
RDW: 25.5 — ABNORMAL HIGH

## 2010-12-18 LAB — TYPE AND SCREEN: Antibody Screen: NEGATIVE

## 2010-12-18 LAB — HCG, QUANTITATIVE, PREGNANCY: hCG, Beta Chain, Quant, S: <2

## 2011-01-07 LAB — WET PREP, GENITAL
Clue Cells Wet Prep HPF POC: NONE SEEN
Yeast Wet Prep HPF POC: NONE SEEN

## 2011-01-07 LAB — URINALYSIS, ROUTINE W REFLEX MICROSCOPIC
Bilirubin Urine: NEGATIVE
Hgb urine dipstick: NEGATIVE
Nitrite: NEGATIVE
Urobilinogen, UA: 0.2
pH: 5.5

## 2011-01-07 LAB — PREGNANCY, URINE: Preg Test, Ur: NEGATIVE

## 2011-08-14 ENCOUNTER — Other Ambulatory Visit: Payer: Self-pay | Admitting: Family Medicine

## 2011-08-14 DIAGNOSIS — Z139 Encounter for screening, unspecified: Secondary | ICD-10-CM

## 2011-08-26 ENCOUNTER — Ambulatory Visit (HOSPITAL_COMMUNITY)
Admission: RE | Admit: 2011-08-26 | Discharge: 2011-08-26 | Disposition: A | Discharge status: Home / Self Care | Payer: Medicaid Other | Source: Ambulatory Visit | Attending: Family Medicine | Admitting: Family Medicine

## 2011-08-26 DIAGNOSIS — Z139 Encounter for screening, unspecified: Secondary | ICD-10-CM

## 2011-08-26 DIAGNOSIS — Z1231 Encounter for screening mammogram for malignant neoplasm of breast: Secondary | ICD-10-CM | POA: Insufficient documentation

## 2011-11-11 ENCOUNTER — Encounter (HOSPITAL_COMMUNITY): Payer: Self-pay | Admitting: *Deleted

## 2011-11-11 ENCOUNTER — Emergency Department (HOSPITAL_COMMUNITY)
Admission: EM | Admit: 2011-11-11 | Discharge: 2011-11-11 | Disposition: A | Discharge status: Home / Self Care | Payer: Medicaid Other | Attending: Emergency Medicine | Admitting: Emergency Medicine

## 2011-11-11 DIAGNOSIS — M51379 Other intervertebral disc degeneration, lumbosacral region without mention of lumbar back pain or lower extremity pain: Secondary | ICD-10-CM | POA: Insufficient documentation

## 2011-11-11 DIAGNOSIS — Z888 Allergy status to other drugs, medicaments and biological substances status: Secondary | ICD-10-CM | POA: Insufficient documentation

## 2011-11-11 DIAGNOSIS — E785 Hyperlipidemia, unspecified: Secondary | ICD-10-CM | POA: Insufficient documentation

## 2011-11-11 DIAGNOSIS — G8929 Other chronic pain: Secondary | ICD-10-CM | POA: Insufficient documentation

## 2011-11-11 DIAGNOSIS — M549 Dorsalgia, unspecified: Secondary | ICD-10-CM

## 2011-11-11 DIAGNOSIS — K219 Gastro-esophageal reflux disease without esophagitis: Secondary | ICD-10-CM | POA: Insufficient documentation

## 2011-11-11 DIAGNOSIS — M19049 Primary osteoarthritis, unspecified hand: Secondary | ICD-10-CM | POA: Insufficient documentation

## 2011-11-11 DIAGNOSIS — I10 Essential (primary) hypertension: Secondary | ICD-10-CM | POA: Insufficient documentation

## 2011-11-11 DIAGNOSIS — M5137 Other intervertebral disc degeneration, lumbosacral region: Secondary | ICD-10-CM | POA: Insufficient documentation

## 2011-11-11 DIAGNOSIS — F319 Bipolar disorder, unspecified: Secondary | ICD-10-CM | POA: Insufficient documentation

## 2011-11-11 DIAGNOSIS — M5030 Other cervical disc degeneration, unspecified cervical region: Secondary | ICD-10-CM | POA: Insufficient documentation

## 2011-11-11 DIAGNOSIS — Z7982 Long term (current) use of aspirin: Secondary | ICD-10-CM | POA: Insufficient documentation

## 2011-11-11 MED ORDER — OXYCODONE-ACETAMINOPHEN 5-325 MG PO TABS: 1.0000 | ORAL_TABLET | ORAL | Status: AC | PRN

## 2011-11-11 MED ORDER — MELOXICAM 7.5 MG PO TABS: 7.5000 mg | ORAL_TABLET | Freq: Every day | ORAL | Status: DC

## 2011-11-11 NOTE — ED Notes (Signed)
Low back pain for 1 month, pain in both hands for 3 weeks. Increased pain with movement.

## 2011-11-11 NOTE — ED Provider Notes (Signed)
History     CSN: [id number]  Arrival date & time 11/11/11  1401   First MD Initiated Contact with Patient 11/11/11 1556      Chief Complaint  Patient presents with  . Back Pain    (Consider location/radiation/quality/duration/timing/severity/associated sxs/prior treatment) HPI Comments: Patient c/o chronic pain to her lower back and fingers of bilateral hands.  Pain has been present for over one month.  Pain is worse with movement and improves with rest.  She denies swelling, discoloration, numbness, weakness or incontinence.    Patient is a 51 y.o. female presenting with back pain. The history is provided by the patient.  Back Pain  This is a chronic problem. The current episode started more than 1 week ago. The problem occurs constantly. The problem has not changed since onset.The pain is associated with no known injury. The pain is present in the lumbar spine. The quality of the pain is described as aching. The pain does not radiate. The pain is moderate. The symptoms are aggravated by bending, twisting and certain positions. The pain is the same all the time. Pertinent negatives include no chest pain, no fever, no numbness, no abdominal pain, no abdominal swelling, no bowel incontinence, no perianal numbness, no bladder incontinence, no dysuria, no pelvic pain, no leg pain, no paresthesias, no paresis, no tingling and no weakness. She has tried nothing for the symptoms. The treatment provided no relief.    Past Medical History  Diagnosis Date  . Bipolar disorder   . Asthma   . DDD (degenerative disc disease), cervical   . DDD (degenerative disc disease), lumbar   . Hypertension   . Shingles   . Hyperlipidemia   . GERD (gastroesophageal reflux disease)   . Skin lesions, generalized     1.2 CM FLAT FAWN COLOR AT THE T10 AREA JUST RIGHT OF SPINE   . Neuropathy of foot     Feet   . Mole (skin)     pt. thinks its a tick     Past Surgical History  Procedure Date  . Cesarean  section   . Back surgery   . Abdominal hysterectomy   . Neck surgery     History reviewed. No pertinent family history.  History  Substance Use Topics  . Smoking status: Never Smoker   . Smokeless tobacco: Not on file  . Alcohol Use: No    OB History    Grav Para Term Preterm Abortions TAB SAB Ect Mult Living                  Review of Systems  Constitutional: Negative for fever.  Respiratory: Negative for shortness of breath.   Cardiovascular: Negative for chest pain.  Gastrointestinal: Negative for vomiting, abdominal pain, constipation and bowel incontinence.  Genitourinary: Negative for bladder incontinence, dysuria, hematuria, flank pain, decreased urine volume, difficulty urinating and pelvic pain.       No Perineal numbness or incontinence of urine or feces  Musculoskeletal: Positive for back pain and arthralgias. Negative for joint swelling and gait problem.  Skin: Negative for rash.  Neurological: Negative for dizziness, tingling, weakness, numbness and paresthesias.  All other systems reviewed and are negative.    Allergies  Gabapentin  Home Medications   Current Outpatient Rx  Name Route Sig Dispense Refill  . AMITRIPTYLINE HCL 10 MG PO TABS Oral Take 10 mg by mouth at bedtime.      . ARIPIPRAZOLE 2 MG PO TABS Oral Take 2 mg by  mouth daily.      . ASPIRIN 81 MG PO TABS Oral Take 81 mg by mouth daily.      Marland Kitchen ESTRADIOL 0.5 MG PO TABS Oral Take 0.5 mg by mouth daily.      Marland Kitchen LISINOPRIL 10 MG PO TABS Oral Take 10 mg by mouth daily.      Marland Kitchen METFORMIN HCL 1000 MG PO TABS Oral Take 1,000 mg by mouth 2 (two) times daily with a meal.      . PIOGLITAZONE HCL 15 MG PO TABS Oral Take 15 mg by mouth daily.      Marland Kitchen ROPINIROLE HCL 1 MG PO TABS Oral Take 1 mg by mouth every morning.      Marland Kitchen SIMVASTATIN 20 MG PO TABS Oral Take 20 mg by mouth at bedtime.      . VENLAFAXINE HCL ER 75 MG PO CP24 Oral Take 75 mg by mouth daily.        BP 113/69  Pulse 99  Temp 97.6 F (36.4  C) (Oral)  Resp 20  Ht 5\' 1"  (1.549 m)  Wt 160 lb (72.576 kg)  BMI 30.23 kg/m2  SpO2 99%  Physical Exam  Nursing note and vitals reviewed. Constitutional: She is oriented to person, place, and time. She appears well-developed and well-nourished. No distress.  HENT:  Head: Normocephalic and atraumatic.  Neck: Normal range of motion. Neck supple.  Cardiovascular: Normal rate, regular rhythm and intact distal pulses.   No murmur heard. Pulmonary/Chest: Effort normal and breath sounds normal.  Musculoskeletal: She exhibits tenderness. She exhibits no edema.       Lumbar back: She exhibits tenderness and pain. She exhibits normal range of motion, no swelling, no deformity, no laceration and normal pulse.       Back:       ttp of the lumbar paraspinal muscles. No bony tenderness or edema.  Patient also has ttp of the phalanges of the bilateral hands.  primarily at the DIP joints of the second and third fingers with Heberden's nodes present.  No erythema or excessive warmth.  Radial pulses equal and brisk, sensation intact  Neurological: She is alert and oriented to person, place, and time. No cranial nerve deficit or sensory deficit. She exhibits normal muscle tone. Coordination and gait normal.  Reflex Scores:      Patellar reflexes are 2+ on the right side and 2+ on the left side.      Achilles reflexes are 2+ on the right side and 2+ on the left side. Skin: Skin is warm and dry.    ED Course  Procedures (including critical care time)  Labs Reviewed - No data to display      MDM  Previous ED charts and nursing notes.    Patient has ttp of the lumbar paraspinal muscles and bilateral SI joints.  No focal neuro deficits on exam.  Ambulates with a steady gait.   Pain is chronic, doubt acute neurological or infectious process.  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  Pt advised to f/u with her PMD for recheck, exercise will start on prescription NSAID.    The patient appears reasonably screened  and/or stabilized for discharge and I doubt any other medical condition or other [facility] requiring further screening, evaluation, or treatment in the ED at this time prior to discharge.         Gia Lusher L. [place], [place] 11/15/11 2011

## 2011-11-18 NOTE — ED Provider Notes (Signed)
Medical screening examination/treatment/procedure(s) were performed by non-physician practitioner and as supervising physician I was immediately available for consultation/collaboration.  Diann Bangerter, MD 11/18/11 1825 

## 2012-05-13 ENCOUNTER — Emergency Department (HOSPITAL_COMMUNITY)
Admission: EM | Admit: 2012-05-13 | Discharge: 2012-05-13 | Disposition: A | Discharge status: Home / Self Care | Payer: Medicaid Other | Attending: Emergency Medicine | Admitting: Emergency Medicine

## 2012-05-13 ENCOUNTER — Encounter (HOSPITAL_COMMUNITY): Payer: Self-pay | Admitting: *Deleted

## 2012-05-13 DIAGNOSIS — Z791 Long term (current) use of non-steroidal anti-inflammatories (NSAID): Secondary | ICD-10-CM | POA: Insufficient documentation

## 2012-05-13 DIAGNOSIS — Z8669 Personal history of other diseases of the nervous system and sense organs: Secondary | ICD-10-CM | POA: Insufficient documentation

## 2012-05-13 DIAGNOSIS — Z8719 Personal history of other diseases of the digestive system: Secondary | ICD-10-CM | POA: Insufficient documentation

## 2012-05-13 DIAGNOSIS — Z79899 Other long term (current) drug therapy: Secondary | ICD-10-CM | POA: Insufficient documentation

## 2012-05-13 DIAGNOSIS — E785 Hyperlipidemia, unspecified: Secondary | ICD-10-CM | POA: Insufficient documentation

## 2012-05-13 DIAGNOSIS — F319 Bipolar disorder, unspecified: Secondary | ICD-10-CM | POA: Insufficient documentation

## 2012-05-13 DIAGNOSIS — Z9889 Other specified postprocedural states: Secondary | ICD-10-CM | POA: Insufficient documentation

## 2012-05-13 DIAGNOSIS — Z8619 Personal history of other infectious and parasitic diseases: Secondary | ICD-10-CM | POA: Insufficient documentation

## 2012-05-13 DIAGNOSIS — M5430 Sciatica, unspecified side: Secondary | ICD-10-CM | POA: Insufficient documentation

## 2012-05-13 DIAGNOSIS — M545 Low back pain, unspecified: Secondary | ICD-10-CM | POA: Insufficient documentation

## 2012-05-13 DIAGNOSIS — J45909 Unspecified asthma, uncomplicated: Secondary | ICD-10-CM | POA: Insufficient documentation

## 2012-05-13 DIAGNOSIS — Z872 Personal history of diseases of the skin and subcutaneous tissue: Secondary | ICD-10-CM | POA: Insufficient documentation

## 2012-05-13 DIAGNOSIS — Z8739 Personal history of other diseases of the musculoskeletal system and connective tissue: Secondary | ICD-10-CM | POA: Insufficient documentation

## 2012-05-13 DIAGNOSIS — I10 Essential (primary) hypertension: Secondary | ICD-10-CM | POA: Insufficient documentation

## 2012-05-13 LAB — URINALYSIS, ROUTINE W REFLEX MICROSCOPIC
Ketones, ur: NEGATIVE mg/dL
Leukocytes, UA: NEGATIVE
Nitrite: NEGATIVE
Protein, ur: NEGATIVE mg/dL
Urobilinogen, UA: 0.2 mg/dL (ref 0.0–1.0)
pH: 6 (ref 5.0–8.0)

## 2012-05-13 MED ORDER — HYDROCODONE-ACETAMINOPHEN 5-325 MG PO TABS: 1.0000 | ORAL_TABLET | Freq: Four times a day (QID) | ORAL | Status: DC | PRN

## 2012-05-13 MED ORDER — CYCLOBENZAPRINE HCL 10 MG PO TABS: 10.0000 mg | ORAL_TABLET | Freq: Two times a day (BID) | ORAL | Status: DC | PRN

## 2012-05-13 MED ORDER — HYDROMORPHONE HCL PF 2 MG/ML IJ SOLN
2.0000 mg | Freq: Once | INTRAMUSCULAR | Status: AC
  Administered 2012-05-13: 2 mg via INTRAMUSCULAR
  Filled 2012-05-13: qty 1

## 2012-05-13 NOTE — ED Provider Notes (Addendum)
History     CSN: [id number]  Arrival date & time 05/13/12  1102   First MD Initiated Contact with Patient 05/13/12 1207      Chief Complaint  Patient presents with  . Back Pain  . Flank Pain    (Consider location/radiation/quality/duration/timing/severity/associated sxs/prior treatment) The history is provided by the patient and the spouse.  52 year old female. Primary care Dr. [facility]. Patient with long history of back problems. Starting one month ago they started to reoccur. Starting 2 days ago started radiation down the left leg. Pain is 10 out of 10 described as sharp in and ate. Pain goes down the left leg to the level of the ankle. Denies any numbness or weakness in the left foot. Patient's followed by orthopedics in [place]. Patient has had steroid injections in the back in the past as recently July for recurrent back problems. Patient denies any distinct injury or episode that brought it on.  Past Medical History  Diagnosis Date  . Bipolar disorder   . Asthma   . DDD (degenerative disc disease), cervical   . DDD (degenerative disc disease), lumbar   . Hypertension   . Shingles   . Hyperlipidemia   . GERD (gastroesophageal reflux disease)   . Skin lesions, generalized     1.2 CM FLAT FAWN COLOR AT THE T10 AREA JUST RIGHT OF SPINE   . Neuropathy of foot     Feet   . Mole (skin)     pt. thinks its a tick     Past Surgical History  Procedure Laterality Date  . Cesarean section    . Back surgery    . Abdominal hysterectomy    . Neck surgery      No family history on file.  History  Substance Use Topics  . Smoking status: Never Smoker   . Smokeless tobacco: Not on file  . Alcohol Use: No    OB History   Grav Para Term Preterm Abortions TAB SAB Ect Mult Living                  Review of Systems  Constitutional: Negative for fever and chills.  HENT: Negative for congestion and neck pain.   Eyes: Negative for visual  disturbance.  Respiratory: Negative for shortness of breath.   Cardiovascular: Negative for chest pain.  Genitourinary: Negative for dysuria.  Musculoskeletal: Positive for back pain.  Skin: Negative for rash.  Neurological: Negative for weakness, numbness and headaches.  Hematological: Does not bruise/bleed easily.  Psychiatric/Behavioral: Negative for confusion.    Allergies  Gabapentin  Home Medications   Current Outpatient Rx  Name  Route  Sig  Dispense  Refill  . estradiol (ESTRACE) 0.5 MG tablet   Oral   Take 0.5 mg by mouth 2 (two) times daily.          Marland Kitchen lisinopril (PRINIVIL,ZESTRIL) 10 MG tablet   Oral   Take 10 mg by mouth daily.           . meloxicam (MOBIC) 15 MG tablet   Oral   Take 15 mg by mouth at bedtime.         . metFORMIN (GLUCOPHAGE) 1000 MG tablet   Oral   Take 1,000 mg by mouth 2 (two) times daily with a meal.           . pioglitazone (ACTOS) 15 MG tablet   Oral   Take 15 mg by mouth daily.           Marland Kitchen  simvastatin (ZOCOR) 40 MG tablet   Oral   Take 40 mg by mouth every evening.         . venlafaxine (EFFEXOR-XR) 75 MG 24 hr capsule   Oral   Take 225 mg by mouth daily.          . cyclobenzaprine (FLEXERIL) 10 MG tablet   Oral   Take 1 tablet (10 mg total) by mouth 2 (two) times daily as needed for muscle spasms.   20 tablet   0   . HYDROcodone-acetaminophen (NORCO/VICODIN) 5-325 MG per tablet   Oral   Take 1-2 tablets by mouth every 6 (six) hours as needed for pain.   20 tablet   0     BP 123/74  Pulse 91  Temp(Src) 97.9 F (36.6 C) (Oral)  Resp 18  Ht 5\' 1"  (1.549 m)  Wt 155 lb (70.308 kg)  BMI 29.3 kg/m2  SpO2 96%  Physical Exam  Nursing note and vitals reviewed. Constitutional: She is oriented to person, place, and time. She appears well-developed and well-nourished.  HENT:  Head: Normocephalic and atraumatic.  Eyes: Conjunctivae and EOM are normal. Pupils are equal, round, and reactive to light.  Neck:  Normal range of motion. Neck supple.  Cardiovascular: Normal rate, regular rhythm and normal heart sounds.   No murmur heard. Pulmonary/Chest: Effort normal and breath sounds normal.  Abdominal: Soft. Bowel sounds are normal. There is no tenderness.  Musculoskeletal: Normal range of motion. She exhibits no edema.  Back is nontender to palpation. No lower extremity focal neuro deficits.  Neurological: She is alert and oriented to person, place, and time. No cranial nerve deficit. She exhibits normal muscle tone. Coordination normal.  Skin: Skin is warm. No rash noted. No erythema.    ED Course  Procedures (including critical care time)  Labs Reviewed  URINALYSIS, ROUTINE W REFLEX MICROSCOPIC - Abnormal; Notable for the following:    APPearance CLOUDY (*)    All other components within normal limits   No results found. Results for orders placed during the hospital encounter of 05/13/12  URINALYSIS, ROUTINE W REFLEX MICROSCOPIC      Result Value Range   Color, Urine YELLOW  YELLOW   APPearance CLOUDY (*) CLEAR   Specific Gravity, Urine 1.010  1.005 - 1.030   pH 6.0  5.0 - 8.0   Glucose, UA NEGATIVE  NEGATIVE mg/dL   Hgb urine dipstick NEGATIVE  NEGATIVE   Bilirubin Urine NEGATIVE  NEGATIVE   Ketones, ur NEGATIVE  NEGATIVE mg/dL   Protein, ur NEGATIVE  NEGATIVE mg/dL   Urobilinogen, UA 0.2  0.0 - 1.0 mg/dL   Nitrite NEGATIVE  NEGATIVE   Leukocytes, UA NEGATIVE  NEGATIVE     1. Back pain   2. Sciatica neuralgia, left       MDM   Patient with history of back problems in the past. Is followed by orthopedic surgery for the back problems has had injections in the back with steroids most recent was July. Here a late increased pain to the back area and here in the last day radiation of pain down the left leg. No focal neuro deficit distally. Patient improved with pain medication in ED will refer back to her orthopedic doctor and sent home with hydrocodone and  Flexeril.       Shelda Jakes, MD 05/13/12 1358  Shelda Jakes, MD 05/13/12 [phone]

## 2012-05-13 NOTE — ED Provider Notes (Deleted)
History    This chart was scribed for Shelda Jakes, MD by Sofie Rower, ED Scribe. The patient was seen in room APA06/APA06 and the patient's care was started at 12:07PM.    CSN: [id number]  Arrival date & time 05/13/12  1102   First MD Initiated Contact with Patient 05/13/12 1207      Chief Complaint  Patient presents with  . Back Pain  . Flank Pain    (Consider location/radiation/quality/duration/timing/severity/associated sxs/prior treatment) HPI  Charlotte Perez is a 52 y.o. female , with a hx of DDD and back surgery (perfromed 3 years ago by Dr. Shon Baton, Orthopedist, located in [place], [place] who presents to the Emergency Department complaining of gradual, progressively worsening back pain, located at the bilateral lumbar region, radiating downwards towards the LLE, onset one month ago (mid January).  Associated symptoms include bilateral flank pain.   The pt reports she has been experiencing a bilateral lumbar back pain for the past month, however, she informs she experienced a burning, radiating pain, traveling from her lumbar region, downwards towards the LLE, yesterday evening. The pt reports the radiating pain travels all the way to her left ankle.   The pt's last pain injection was July, 2014  The pt has taken hydrocodone which provides relief of the back pain.   Modifying factors include certain movements and positions which intensify the back pain.  The pt has a hx of   The pt denies headache, neck pain, chest pain, abdominal pain, nausea, vomiting, dysuria, cough myalgias, fevers, chills, and rash. Furthremore, the pt denies any hx of bleeding easily.    The pt does not smoke or drink alcohol.   PCP is Dr. Christell Constant ([facility])   Past Medical History  Diagnosis Date  . Bipolar disorder   . Asthma   . DDD (degenerative disc disease), cervical   . DDD (degenerative disc disease), lumbar   . Hypertension   . Shingles   .  Hyperlipidemia   . GERD (gastroesophageal reflux disease)   . Skin lesions, generalized     1.2 CM FLAT FAWN COLOR AT THE T10 AREA JUST RIGHT OF SPINE   . Neuropathy of foot     Feet   . Mole (skin)     pt. thinks its a tick     Past Surgical History  Procedure Laterality Date  . Cesarean section    . Back surgery    . Abdominal hysterectomy    . Neck surgery      No family history on file.  History  Substance Use Topics  . Smoking status: Never Smoker   . Smokeless tobacco: Not on file  . Alcohol Use: No    OB History   Grav Para Term Preterm Abortions TAB SAB Ect Mult Living                  Review of Systems  Allergies  Gabapentin  Home Medications   Current Outpatient Rx  Name  Route  Sig  Dispense  Refill  . estradiol (ESTRACE) 0.5 MG tablet   Oral   Take 0.5 mg by mouth 2 (two) times daily.          Marland Kitchen lisinopril (PRINIVIL,ZESTRIL) 10 MG tablet   Oral   Take 10 mg by mouth daily.           . meloxicam (MOBIC) 15 MG tablet   Oral   Take 15 mg by mouth at bedtime.         Marland Kitchen  metFORMIN (GLUCOPHAGE) 1000 MG tablet   Oral   Take 1,000 mg by mouth 2 (two) times daily with a meal.           . pioglitazone (ACTOS) 15 MG tablet   Oral   Take 15 mg by mouth daily.           . simvastatin (ZOCOR) 40 MG tablet   Oral   Take 40 mg by mouth every evening.         . venlafaxine (EFFEXOR-XR) 75 MG 24 hr capsule   Oral   Take 225 mg by mouth daily.          . cyclobenzaprine (FLEXERIL) 10 MG tablet   Oral   Take 1 tablet (10 mg total) by mouth 2 (two) times daily as needed for muscle spasms.   20 tablet   0   . HYDROcodone-acetaminophen (NORCO/VICODIN) 5-325 MG per tablet   Oral   Take 1-2 tablets by mouth every 6 (six) hours as needed for pain.   20 tablet   0     BP 108/83  Pulse 93  Temp(Src) 97.9 F (36.6 C) (Oral)  Resp 20  Ht 5\' 1"  (1.549 m)  Wt 155 lb (70.308 kg)  BMI 29.3 kg/m2  SpO2 100%  Physical Exam  Nursing  note and vitals reviewed. Constitutional: She is oriented to person, place, and time. She appears well-developed and well-nourished. No distress.  HENT:  Head: Normocephalic and atraumatic.  Mouth/Throat: Oropharynx is clear and moist.  Eyes: EOM are normal. Pupils are equal, round, and reactive to light.  Neck: Neck supple. No tracheal deviation present.  Cardiovascular: Normal rate, regular rhythm and normal heart sounds.   No murmur heard. Pulmonary/Chest: Effort normal and breath sounds normal. No respiratory distress. She has no wheezes.  Abdominal: Soft. Bowel sounds are normal. She exhibits no distension. There is no tenderness.  Musculoskeletal: Normal range of motion. She exhibits no edema.  Pt is able to raise both legs on exam.   Neurological: She is alert and oriented to person, place, and time. No cranial nerve deficit or sensory deficit.  Skin: Skin is warm and dry.  Psychiatric: She has a normal mood and affect. Her behavior is normal.    ED Course  Procedures (including critical care time)  DIAGNOSTIC STUDIES: Oxygen Saturation is 100% on room air, normal by my interpretation.    COORDINATION OF CARE:  12:45 PM- Treatment plan discussed with patient. Pt agrees with treatment.      Labs Reviewed  URINALYSIS, ROUTINE W REFLEX MICROSCOPIC - Abnormal; Notable for the following:    APPearance CLOUDY (*)    All other components within normal limits   No results found.   1. Back pain   2. Sciatica neuralgia, left       MDM   Patient with history of chronic back pain. Said surgery on the back before followed by Dr. Shon Baton from orthopedics surgery. Starting about a month ago the back started acting up again. The past 2 days with new pain  radiation of pain down the left leg. But no focal distal neurological deficits in the foot. Patient improved with pain medicine here.     I personally performed the services described in this documentation, which was  scribed in my presence. The recorded information has been reviewed and is accurate.     Shelda Jakes, MD 05/13/12 [phone]

## 2012-05-13 NOTE — ED Notes (Signed)
Lower back pain with pain radiating down left leg. Also states bilateral flank pain "where my kidney are, both of them" Pain x 1 mo.

## 2012-05-13 NOTE — ED Notes (Signed)
Patient with no complaints at this time. Respirations even and unlabored. Skin warm/dry. Discharge instructions reviewed with patient at this time. Patient given opportunity to voice concerns/ask questions. Patient discharged at this time and left Emergency Department with steady gait.   

## 2012-06-18 ENCOUNTER — Encounter: Payer: Self-pay | Admitting: *Deleted

## 2012-06-24 ENCOUNTER — Emergency Department (HOSPITAL_COMMUNITY): Payer: Medicaid Other

## 2012-06-24 ENCOUNTER — Encounter (HOSPITAL_COMMUNITY): Payer: Self-pay | Admitting: Emergency Medicine

## 2012-06-24 ENCOUNTER — Emergency Department (HOSPITAL_COMMUNITY)
Admission: EM | Admit: 2012-06-24 | Discharge: 2012-06-24 | Disposition: A | Discharge status: Home / Self Care | Payer: Medicaid Other | Attending: Emergency Medicine | Admitting: Emergency Medicine

## 2012-06-24 DIAGNOSIS — M5030 Other cervical disc degeneration, unspecified cervical region: Secondary | ICD-10-CM | POA: Insufficient documentation

## 2012-06-24 DIAGNOSIS — I10 Essential (primary) hypertension: Secondary | ICD-10-CM | POA: Insufficient documentation

## 2012-06-24 DIAGNOSIS — G5790 Unspecified mononeuropathy of unspecified lower limb: Secondary | ICD-10-CM | POA: Insufficient documentation

## 2012-06-24 DIAGNOSIS — M129 Arthropathy, unspecified: Secondary | ICD-10-CM | POA: Insufficient documentation

## 2012-06-24 DIAGNOSIS — Z8719 Personal history of other diseases of the digestive system: Secondary | ICD-10-CM | POA: Insufficient documentation

## 2012-06-24 DIAGNOSIS — M51379 Other intervertebral disc degeneration, lumbosacral region without mention of lumbar back pain or lower extremity pain: Secondary | ICD-10-CM | POA: Insufficient documentation

## 2012-06-24 DIAGNOSIS — Z79899 Other long term (current) drug therapy: Secondary | ICD-10-CM | POA: Insufficient documentation

## 2012-06-24 DIAGNOSIS — Z8739 Personal history of other diseases of the musculoskeletal system and connective tissue: Secondary | ICD-10-CM | POA: Insufficient documentation

## 2012-06-24 DIAGNOSIS — M65849 Other synovitis and tenosynovitis, unspecified hand: Secondary | ICD-10-CM | POA: Insufficient documentation

## 2012-06-24 DIAGNOSIS — K219 Gastro-esophageal reflux disease without esophagitis: Secondary | ICD-10-CM | POA: Insufficient documentation

## 2012-06-24 DIAGNOSIS — F319 Bipolar disorder, unspecified: Secondary | ICD-10-CM | POA: Insufficient documentation

## 2012-06-24 DIAGNOSIS — L988 Other specified disorders of the skin and subcutaneous tissue: Secondary | ICD-10-CM | POA: Insufficient documentation

## 2012-06-24 DIAGNOSIS — E785 Hyperlipidemia, unspecified: Secondary | ICD-10-CM | POA: Insufficient documentation

## 2012-06-24 DIAGNOSIS — Z8619 Personal history of other infectious and parasitic diseases: Secondary | ICD-10-CM | POA: Insufficient documentation

## 2012-06-24 DIAGNOSIS — M5137 Other intervertebral disc degeneration, lumbosacral region: Secondary | ICD-10-CM | POA: Insufficient documentation

## 2012-06-24 DIAGNOSIS — Z872 Personal history of diseases of the skin and subcutaneous tissue: Secondary | ICD-10-CM | POA: Insufficient documentation

## 2012-06-24 DIAGNOSIS — M778 Other enthesopathies, not elsewhere classified: Secondary | ICD-10-CM

## 2012-06-24 DIAGNOSIS — J45909 Unspecified asthma, uncomplicated: Secondary | ICD-10-CM | POA: Insufficient documentation

## 2012-06-24 DIAGNOSIS — M65839 Other synovitis and tenosynovitis, unspecified forearm: Secondary | ICD-10-CM | POA: Insufficient documentation

## 2012-06-24 NOTE — ED Notes (Signed)
Pt states right hand pain with swelling. States has arthritis and unsure why its hurting. Denies injury,

## 2012-06-24 NOTE — ED Provider Notes (Signed)
History     CSN: [id number]  Arrival date & time 06/24/12  1321   First MD Initiated Contact with Patient 06/24/12 1345      Chief Complaint  Patient presents with  . Hand Pain    (Consider location/radiation/quality/duration/timing/severity/associated sxs/prior treatment) HPI Comments: Charlotte Perez is a 52 y.o. Female presenting with right wrist pain and swelling which started about 4 days ago.  She denies any obvious injury but has been active with her arms as her family heats with wood and she has been helping to harvest and stack some trees this past week.  She is using meloxicam for her chronic arthritis pain which is prescribed by her orthopedist at [facility]. Pain is aching,  Worse with flexion and extension and does not radiate. She denies pain in her hands or fingers, although points out a "knot" on her right volar hand which she has received steroid injection for in the past due to trigger finger.       The history is provided by the patient.    Past Medical History  Diagnosis Date  . Bipolar disorder   . Asthma   . DDD (degenerative disc disease), cervical   . DDD (degenerative disc disease), lumbar   . Hypertension   . Shingles   . Hyperlipidemia   . GERD (gastroesophageal reflux disease)   . Skin lesions, generalized     1.2 CM FLAT FAWN COLOR AT THE T10 AREA JUST RIGHT OF SPINE   . Neuropathy of foot     Feet   . Mole (skin)     pt. thinks its a tick     Past Surgical History  Procedure Laterality Date  . Cesarean section    . Back surgery    . Abdominal hysterectomy    . Neck surgery      History reviewed. No pertinent family history.  History  Substance Use Topics  . Smoking status: Never Smoker   . Smokeless tobacco: Not on file  . Alcohol Use: No    OB History   Grav Para Term Preterm Abortions TAB SAB Ect Mult Living                  Review of Systems  Constitutional: Negative for fever.  Musculoskeletal: Positive  for joint swelling and arthralgias. Negative for myalgias.  Neurological: Negative for weakness and numbness.    Allergies  Gabapentin  Home Medications   Current Outpatient Rx  Name  Route  Sig  Dispense  Refill  . acetaminophen (TYLENOL) 500 MG tablet   Oral   Take 1,000 mg by mouth every 6 (six) hours as needed for pain.         . cyclobenzaprine (FLEXERIL) 10 MG tablet   Oral   Take 1 tablet (10 mg total) by mouth 2 (two) times daily as needed for muscle spasms.   20 tablet   0   . estradiol (ESTRACE) 0.5 MG tablet   Oral   Take 0.5 mg by mouth 2 (two) times daily.          Marland Kitchen lisinopril (PRINIVIL,ZESTRIL) 10 MG tablet   Oral   Take 10 mg by mouth daily.           . meloxicam (MOBIC) 15 MG tablet   Oral   Take 15 mg by mouth at bedtime.         . metFORMIN (GLUCOPHAGE) 1000 MG tablet   Oral   Take 1,000 mg  by mouth 2 (two) times daily with a meal.           . pioglitazone (ACTOS) 15 MG tablet   Oral   Take 15 mg by mouth daily.           . simvastatin (ZOCOR) 40 MG tablet   Oral   Take 40 mg by mouth every evening.         . venlafaxine (EFFEXOR-XR) 75 MG 24 hr capsule   Oral   Take 225 mg by mouth at bedtime.            BP 102/57  Pulse 85  Temp(Src) 97.9 F (36.6 C) (Oral)  Resp 16  Ht 5\' 1"  (1.549 m)  Wt 153 lb (69.4 kg)  BMI 28.92 kg/m2  SpO2 99%  Physical Exam  Constitutional: She appears well-developed and well-nourished.  HENT:  Head: Atraumatic.  Neck: Normal range of motion.  Cardiovascular:  Pulses equal bilaterally  Musculoskeletal: She exhibits tenderness. She exhibits no edema.       Right wrist: She exhibits tenderness and bony tenderness. She exhibits no swelling, no effusion, no crepitus and no deformity.  TTP along right dorsal distal ulner styloid without edema,  No erythema,  Skin intact without lesion or injury.  Cap refill less than 3 sec in fingers.  Small bead like nodule mid palm at flexor tendon,  nontender.  No popping with flex and extension of fingers.  Neurological: She is alert. She has normal strength. She displays normal reflexes. No sensory deficit.  Equal strength  Skin: Skin is warm and dry.  Psychiatric: She has a normal mood and affect.    ED Course  Procedures (including critical care time)  Labs Reviewed - No data to display Dg Hand Complete Right  06/24/2012  *RADIOLOGY REPORT*  Clinical Data: Right hand pain.  RIGHT HAND - COMPLETE 3+ VIEW  Comparison: None  Findings: The joint spaces are fairly well maintained.  There are DIP and PIP joint degenerative changes involving the fingers, likely osteoarthritis.  The metacarpal phalangeal joints are maintained.  The radiocarpal and intercarpal joints are maintained. No acute bony findings.  IMPRESSION:  1.  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 2.  No findings for an erosive arthropathy. 3.  No acute bony findings.   Original Report Authenticated By: Rudie Meyer, M.D.      1. Tendonitis of wrist, right       MDM  Pt placed in right velcro wrist splint.  Encouraged ice, elevation,  Continue with meloxicam.  Call her ortho at [facility] for further mngmt.  Patients labs and/or radiological studies were viewed and considered during the medical decision making and disposition process.         Burgess Amor, PA-C 06/24/12 1538

## 2012-06-28 NOTE — ED Provider Notes (Signed)
Medical screening examination/treatment/procedure(s) were performed by non-physician practitioner and as supervising physician I was immediately available for consultation/collaboration. Dionis Autry, MD, FACEP   Nashaun Hillmer L Analiza Cowger, MD 06/28/12 2245 

## 2012-07-01 ENCOUNTER — Other Ambulatory Visit: Payer: Self-pay | Admitting: *Deleted

## 2012-07-01 NOTE — Telephone Encounter (Signed)
Patient last seen in office on 06-01-12 for chronic health check. Rx last filled on 06-01-12 for #60. Please advise. If approved please print script and let pt know up front and ready to pick up. Thank you

## 2012-07-02 ENCOUNTER — Telehealth: Payer: Self-pay | Admitting: Nurse Practitioner

## 2012-07-02 NOTE — Telephone Encounter (Signed)
Chart in office

## 2012-07-03 ENCOUNTER — Other Ambulatory Visit: Payer: Self-pay | Admitting: Family Medicine

## 2012-07-03 MED ORDER — HYDROCODONE-ACETAMINOPHEN 5-325 MG PO TABS: 1.0000 | ORAL_TABLET | Freq: Four times a day (QID) | ORAL | Status: DC | PRN

## 2012-07-13 ENCOUNTER — Ambulatory Visit: Payer: Medicaid Other | Attending: Orthopedic Surgery | Admitting: Physical Therapy

## 2012-07-13 ENCOUNTER — Other Ambulatory Visit: Payer: Self-pay | Admitting: *Deleted

## 2012-07-13 DIAGNOSIS — R5381 Other malaise: Secondary | ICD-10-CM | POA: Insufficient documentation

## 2012-07-13 DIAGNOSIS — M545 Low back pain, unspecified: Secondary | ICD-10-CM | POA: Insufficient documentation

## 2012-07-13 DIAGNOSIS — IMO0001 Reserved for inherently not codable concepts without codable children: Secondary | ICD-10-CM | POA: Insufficient documentation

## 2012-07-13 MED ORDER — METFORMIN HCL 1000 MG PO TABS: 1000.0000 mg | ORAL_TABLET | Freq: Two times a day (BID) | ORAL | Status: DC

## 2012-07-16 ENCOUNTER — Ambulatory Visit: Payer: Medicaid Other | Admitting: Physical Therapy

## 2012-07-17 ENCOUNTER — Other Ambulatory Visit: Payer: Self-pay

## 2012-07-17 MED ORDER — PIOGLITAZONE HCL 15 MG PO TABS: 15.0000 mg | ORAL_TABLET | Freq: Every day | ORAL | Status: DC

## 2012-07-22 ENCOUNTER — Encounter: Payer: Medicaid Other | Admitting: Physical Therapy

## 2012-07-28 ENCOUNTER — Encounter: Payer: Medicaid Other | Admitting: Physical Therapy

## 2012-07-29 ENCOUNTER — Other Ambulatory Visit: Payer: Self-pay

## 2012-07-29 MED ORDER — DICLOFENAC SODIUM 50 MG PO TBEC: 50.0000 mg | DELAYED_RELEASE_TABLET | Freq: Two times a day (BID) | ORAL | Status: DC

## 2012-07-30 ENCOUNTER — Other Ambulatory Visit: Payer: Self-pay | Admitting: *Deleted

## 2012-07-30 NOTE — Telephone Encounter (Signed)
LAST RF 07/03/12. PLEASE PRINT AND CALL PT WHEN READY. LAST OV 06/01/12. WILL SEND CHART BACK TO REVIEW. THANKS.

## 2012-08-03 ENCOUNTER — Ambulatory Visit: Payer: Medicaid Other | Attending: Orthopedic Surgery | Admitting: Physical Therapy

## 2012-08-03 ENCOUNTER — Other Ambulatory Visit: Payer: Self-pay | Admitting: *Deleted

## 2012-08-03 ENCOUNTER — Encounter: Payer: Self-pay | Admitting: *Deleted

## 2012-08-03 DIAGNOSIS — M545 Low back pain, unspecified: Secondary | ICD-10-CM | POA: Insufficient documentation

## 2012-08-03 DIAGNOSIS — IMO0001 Reserved for inherently not codable concepts without codable children: Secondary | ICD-10-CM | POA: Insufficient documentation

## 2012-08-03 DIAGNOSIS — R5381 Other malaise: Secondary | ICD-10-CM | POA: Insufficient documentation

## 2012-08-03 MED ORDER — OMEPRAZOLE 20 MG PO CPDR: 20.0000 mg | DELAYED_RELEASE_CAPSULE | Freq: Every day | ORAL | Status: DC

## 2012-08-03 MED ORDER — SIMVASTATIN 40 MG PO TABS: 40.0000 mg | ORAL_TABLET | Freq: Every evening | ORAL | Status: DC

## 2012-08-03 MED ORDER — HYDROCODONE-ACETAMINOPHEN 5-325 MG PO TABS: 1.0000 | ORAL_TABLET | Freq: Four times a day (QID) | ORAL | Status: DC | PRN

## 2012-08-03 NOTE — Telephone Encounter (Signed)
Printed

## 2012-08-03 NOTE — Telephone Encounter (Signed)
This encounter was created in error - please disregard.

## 2012-08-06 NOTE — Telephone Encounter (Signed)
rx written 07/03/12

## 2012-08-10 ENCOUNTER — Ambulatory Visit: Payer: Medicaid Other | Admitting: Physical Therapy

## 2012-08-22 ENCOUNTER — Other Ambulatory Visit: Payer: Self-pay | Admitting: Family Medicine

## 2012-08-22 DIAGNOSIS — Z0000 Encounter for general adult medical examination without abnormal findings: Secondary | ICD-10-CM

## 2012-08-25 ENCOUNTER — Other Ambulatory Visit: Payer: Self-pay

## 2012-08-25 NOTE — Telephone Encounter (Signed)
Patient needs to be seen before this medication can be refill

## 2012-08-25 NOTE — Telephone Encounter (Signed)
Last filled 07/30/12  Last visit 06/01/12  Print and have nurse call patient to pick up

## 2012-08-26 ENCOUNTER — Other Ambulatory Visit: Payer: Self-pay

## 2012-08-26 NOTE — Telephone Encounter (Signed)
Gennette Pac please review

## 2012-08-26 NOTE — Telephone Encounter (Signed)
Pt notified she needs to be seen for further refills 

## 2012-08-26 NOTE — Telephone Encounter (Signed)
Need chart

## 2012-08-26 NOTE — Telephone Encounter (Signed)
Pt notified she needs to be seen for further refills

## 2012-08-26 NOTE — Telephone Encounter (Signed)
Last filled 07/30/12  Last visit 06/01/12  If approved print and have nurse call patient to pick up

## 2012-08-27 ENCOUNTER — Ambulatory Visit (HOSPITAL_COMMUNITY)
Admission: RE | Admit: 2012-08-27 | Discharge: 2012-08-27 | Disposition: A | Discharge status: Home / Self Care | Payer: Medicaid Other | Source: Ambulatory Visit | Attending: Family Medicine | Admitting: Family Medicine

## 2012-08-27 DIAGNOSIS — Z0000 Encounter for general adult medical examination without abnormal findings: Secondary | ICD-10-CM

## 2012-08-27 DIAGNOSIS — Z1231 Encounter for screening mammogram for malignant neoplasm of breast: Secondary | ICD-10-CM | POA: Insufficient documentation

## 2012-08-27 MED ORDER — HYDROCODONE-ACETAMINOPHEN 5-325 MG PO TABS: 1.0000 | ORAL_TABLET | Freq: Four times a day (QID) | ORAL | Status: DC | PRN

## 2012-08-27 NOTE — Telephone Encounter (Signed)
SCRIPT UP FRONT- PT AWARE

## 2012-08-27 NOTE — Telephone Encounter (Signed)
rx ready for pick up- NTBS bfeore next refill

## 2012-08-27 NOTE — Addendum Note (Signed)
Addended by: Bennie Pierini on: 08/27/2012 03:05 PM   Modules accepted: Orders

## 2012-08-27 NOTE — Telephone Encounter (Signed)
ALREADY DONE

## 2012-08-31 ENCOUNTER — Other Ambulatory Visit: Payer: Self-pay | Admitting: Nurse Practitioner

## 2012-08-31 DIAGNOSIS — R928 Other abnormal and inconclusive findings on diagnostic imaging of breast: Secondary | ICD-10-CM

## 2012-09-02 ENCOUNTER — Ambulatory Visit: Payer: Self-pay | Admitting: Nurse Practitioner

## 2012-09-08 ENCOUNTER — Ambulatory Visit: Payer: Self-pay | Admitting: Nurse Practitioner

## 2012-09-09 ENCOUNTER — Ambulatory Visit (HOSPITAL_COMMUNITY)
Admission: RE | Admit: 2012-09-09 | Discharge: 2012-09-09 | Disposition: A | Discharge status: Home / Self Care | Payer: Medicaid Other | Source: Ambulatory Visit | Attending: Nurse Practitioner | Admitting: Nurse Practitioner

## 2012-09-09 DIAGNOSIS — R928 Other abnormal and inconclusive findings on diagnostic imaging of breast: Secondary | ICD-10-CM | POA: Insufficient documentation

## 2012-10-07 ENCOUNTER — Ambulatory Visit (INDEPENDENT_AMBULATORY_CARE_PROVIDER_SITE_OTHER): Payer: Medicaid Other | Admitting: Nurse Practitioner

## 2012-10-07 ENCOUNTER — Encounter: Payer: Self-pay | Admitting: Nurse Practitioner

## 2012-10-07 VITALS — BP 103/71 | HR 85 | Temp 97.3°F | Ht 61.0 in | Wt 162.0 lb

## 2012-10-07 DIAGNOSIS — E785 Hyperlipidemia, unspecified: Secondary | ICD-10-CM

## 2012-10-07 DIAGNOSIS — M778 Other enthesopathies, not elsewhere classified: Secondary | ICD-10-CM

## 2012-10-07 DIAGNOSIS — F3289 Other specified depressive episodes: Secondary | ICD-10-CM

## 2012-10-07 DIAGNOSIS — M65839 Other synovitis and tenosynovitis, unspecified forearm: Secondary | ICD-10-CM

## 2012-10-07 DIAGNOSIS — F329 Major depressive disorder, single episode, unspecified: Secondary | ICD-10-CM

## 2012-10-07 DIAGNOSIS — K219 Gastro-esophageal reflux disease without esophagitis: Secondary | ICD-10-CM

## 2012-10-07 DIAGNOSIS — N951 Menopausal and female climacteric states: Secondary | ICD-10-CM

## 2012-10-07 DIAGNOSIS — E119 Type 2 diabetes mellitus without complications: Secondary | ICD-10-CM

## 2012-10-07 DIAGNOSIS — IMO0002 Reserved for concepts with insufficient information to code with codable children: Secondary | ICD-10-CM

## 2012-10-07 DIAGNOSIS — I10 Essential (primary) hypertension: Secondary | ICD-10-CM

## 2012-10-07 MED ORDER — ONETOUCH DELICA LANCETS FINE MISC: 1.0000 | Freq: Every day | Status: DC

## 2012-10-07 MED ORDER — VENLAFAXINE HCL ER 75 MG PO CP24: 225.0000 mg | ORAL_CAPSULE | Freq: Every day | ORAL | Status: DC

## 2012-10-07 MED ORDER — PIOGLITAZONE HCL 15 MG PO TABS: 15.0000 mg | ORAL_TABLET | Freq: Every day | ORAL | Status: DC

## 2012-10-07 MED ORDER — LISINOPRIL 10 MG PO TABS: 10.0000 mg | ORAL_TABLET | Freq: Every day | ORAL | Status: DC

## 2012-10-07 MED ORDER — ESTRADIOL 0.5 MG PO TABS: 0.5000 mg | ORAL_TABLET | Freq: Two times a day (BID) | ORAL | Status: DC

## 2012-10-07 MED ORDER — DICLOFENAC SODIUM 50 MG PO TBEC: 50.0000 mg | DELAYED_RELEASE_TABLET | Freq: Two times a day (BID) | ORAL | Status: DC

## 2012-10-07 MED ORDER — SIMVASTATIN 40 MG PO TABS: 40.0000 mg | ORAL_TABLET | Freq: Every evening | ORAL | Status: DC

## 2012-10-07 MED ORDER — OMEPRAZOLE 20 MG PO CPDR: 20.0000 mg | DELAYED_RELEASE_CAPSULE | Freq: Every day | ORAL | Status: DC

## 2012-10-07 MED ORDER — METFORMIN HCL 1000 MG PO TABS
1000.0000 mg | ORAL_TABLET | Freq: Two times a day (BID) | ORAL | Status: DC
Start: 2012-10-07 — End: 2013-01-11

## 2012-10-07 MED ORDER — GLUCOSE BLOOD VI STRP: ORAL_STRIP | Status: DC

## 2012-10-07 MED ORDER — HYDROCODONE-ACETAMINOPHEN 5-325 MG PO TABS: 1.0000 | ORAL_TABLET | Freq: Four times a day (QID) | ORAL | Status: DC | PRN

## 2012-10-07 NOTE — Progress Notes (Signed)
Subjective:    Patient ID: Charlotte Perez, female    DOB: Apr 13, 1960, 52 y.o.   MRN: [id number]  Hypertension This is a chronic problem. The current episode started more than 1 year ago. The problem has been resolved since onset. The problem is controlled. Pertinent negatives include no blurred vision, chest pain, headaches, neck pain, orthopnea, palpitations, peripheral edema, PND, shortness of breath or sweats. There are no associated agents to hypertension. Risk factors for coronary artery disease include diabetes mellitus, dyslipidemia and obesity. Past treatments include ACE inhibitors. The current treatment provides significant improvement. Compliance problems include diet and exercise.   Hyperlipidemia This is a chronic problem. The current episode started more than 1 year ago. The problem is controlled. Recent lipid tests were reviewed and are high. Exacerbating diseases include diabetes and obesity. She has no history of hypothyroidism. Pertinent negatives include no chest pain, focal weakness, leg pain, myalgias or shortness of breath. Current antihyperlipidemic treatment includes statins. The current treatment provides moderate improvement of lipids. Compliance problems include adherence to diet and adherence to exercise.  Risk factors for coronary artery disease include diabetes mellitus, hypertension and obesity.  Diabetes She presents for her follow-up diabetic visit. She has type 2 diabetes mellitus. No MedicAlert identification noted. The initial diagnosis of diabetes was made 7 years ago. Her disease course has been fluctuating. There are no hypoglycemic associated symptoms. Pertinent negatives for hypoglycemia include no headaches or sweats. There are no diabetic associated symptoms. Pertinent negatives for diabetes include no blurred vision and no chest pain. There are no hypoglycemic complications. Symptoms are stable. There are no diabetic complications. Risk factors for coronary  artery disease include dyslipidemia, hypertension and obesity. Current diabetic treatment includes oral agent (dual therapy). She is compliant with treatment most of the time. Her weight is stable. When asked about meal planning, she reported none. She has not had a previous visit with a dietician. There is no change in her home blood glucose trend. (Patient not checking blood sugars at home.) An ACE inhibitor/angiotensin II receptor blocker is being taken. She sees a podiatrist (has bone spurs- takes neurotin not helping.).Eye exam is not current (patient to make appointment).  Chronic Back Pain/DDD Patient has had 2 surgeries 2012- Dr. Shon Baton- Takes care of her pain. Has a TENS unit oin place to help with pain. Takes hydrocodone on a regular basis. Depression Effexor- works well to keep her from being so emotional and stressed. GERD Omerprazole daily keeps symptome under control. * Right wrist pain- saw Ortho in April and was told has tendonitis- voltaren helps but patient put of meds. Review of Systems  HENT: Negative for neck pain.   Eyes: Negative for blurred vision.  Respiratory: Negative for shortness of breath.   Cardiovascular: Negative for chest pain, palpitations, orthopnea and PND.  Musculoskeletal: Positive for back pain (chronic) and arthralgias (riht wrist pain-tendonitis). Negative for myalgias.  Neurological: Negative for focal weakness and headaches.  Hematological: Negative.   Psychiatric/Behavioral: Negative.        Objective:   Physical Exam  Constitutional: She is oriented to person, place, and time. She appears well-developed and well-nourished.  HENT:  Nose: Nose normal.  Mouth/Throat: Oropharynx is clear and moist.  Eyes: EOM are normal.  Neck: Trachea normal, normal range of motion and full passive range of motion without pain. Neck supple. No JVD present. Carotid bruit is not present. No thyromegaly present.  Cardiovascular: Normal rate, regular rhythm, normal  heart sounds and intact distal pulses.  Exam reveals no gallop and no friction rub.   No murmur heard. Pulmonary/Chest: Effort normal and breath sounds normal.  Abdominal: Soft. Bowel sounds are normal. She exhibits no distension and no mass. There is no tenderness.  Musculoskeletal: Normal range of motion.  Lymphadenopathy:    She has no cervical adenopathy.  Neurological: She is alert and oriented to person, place, and time. She has normal reflexes.  Skin: Skin is warm and dry.  Psychiatric: She has a normal mood and affect. Her behavior is normal. Judgment and thought content normal.   BP 103/71  Pulse 85  Temp(Src) 97.3 F (36.3 C) (Oral)  Ht 5\' 1"  (1.549 m)  Wt 162 lb (73.483 kg)  BMI 30.63 kg/m2 Results for orders placed in visit on 10/07/12  POCT GLYCOSYLATED HEMOGLOBIN (HGB A1C)      Result Value Range   Hemoglobin A1C 6.4%            Assessment & Plan:   1. DM (diabetes mellitus)   2. HTN (hypertension)   3. Hyperlipidemia   4. Tendonitis of wrist, right   5. DDD (degenerative disc disease)   6. GERD (gastroesophageal reflux disease)   7. Depression   8. Perimenopausal vasomotor symptoms    Orders Placed This Encounter  Procedures  . COMPLETE METABOLIC PANEL WITH GFR  . NMR Lipoprofile with Lipids  . Ambulatory referral to Orthopedic Surgery    Referral Priority:  Routine    Referral Type:  Surgical    Referral Reason:  Specialty Services Required    Requested Specialty:  Orthopedic Surgery    Number of Visits Requested:  1  . POCT glycosylated hemoglobin (Hb A1C)   Meds ordered this encounter  Medications  . diclofenac (VOLTAREN) 50 MG EC tablet    Sig: Take 1 tablet (50 mg total) by mouth 2 (two) times daily.    Dispense:  60 tablet    Refill:  2    Order Specific Question:  Supervising Provider    Answer:  Ernestina Penna [[id number]]  . HYDROcodone-acetaminophen (NORCO/VICODIN) 5-325 MG per tablet    Sig: Take 1 tablet by mouth every 6 (six) hours as  needed for pain.    Dispense:  60 tablet    Refill:  0    Order Specific Question:  Supervising Provider    Answer:  Ernestina Penna [[id number]]  . lisinopril (PRINIVIL,ZESTRIL) 10 MG tablet    Sig: Take 1 tablet (10 mg total) by mouth daily.    Dispense:  30 tablet    Refill:  5    Order Specific Question:  Supervising Provider    Answer:  Ernestina Penna [[id number]]  . metFORMIN (GLUCOPHAGE) 1000 MG tablet    Sig: Take 1 tablet (1,000 mg total) by mouth 2 (two) times daily with a meal.    Dispense:  60 tablet    Refill:  2    Order Specific Question:  Supervising Provider    Answer:  Ernestina Penna [[id number]]  . omeprazole (PRILOSEC) 20 MG capsule    Sig: Take 1 capsule (20 mg total) by mouth daily.    Dispense:  30 capsule    Refill:  2    Order Specific Question:  Supervising Provider    Answer:  Ernestina Penna [[id number]]  . pioglitazone (ACTOS) 15 MG tablet    Sig: Take 1 tablet (15 mg total) by mouth daily.    Dispense:  30 tablet    Refill:  1  Order Specific Question:  Supervising Provider    Answer:  Ernestina Penna [[id number]]  . simvastatin (ZOCOR) 40 MG tablet    Sig: Take 1 tablet (40 mg total) by mouth every evening.    Dispense:  30 tablet    Refill:  2    Order Specific Question:  Supervising Provider    Answer:  Ernestina Penna [[id number]]  . venlafaxine XR (EFFEXOR-XR) 75 MG 24 hr capsule    Sig: Take 3 capsules (225 mg total) by mouth at bedtime.    Dispense:  30 capsule    Refill:  5    Order Specific Question:  Supervising Provider    Answer:  Ernestina Penna [[id number]]  . estradiol (ESTRACE) 0.5 MG tablet    Sig: Take 1 tablet (0.5 mg total) by mouth 2 (two) times daily.    Dispense:  60 tablet    Refill:  5    Order Specific Question:  Supervising Provider    Answer:  Ernestina Penna [[id number]]  . glucose blood (ONE TOUCH ULTRA TEST) test strip    Sig: Test 1X per and prn-- dx 250.02    Dispense:  100 each    Refill:  12    Order Specific Question:  Supervising Provider     Answer:  Ernestina Penna [[id number]]  . ONETOUCH DELICA LANCETS FINE MISC    Sig: 1 each by Does not apply route daily. Patient test 1X per day and prn-- dx-250.02    Dispense:  100 each    Refill:  11    Order Specific Question:  Supervising Provider    Answer:  Ernestina Penna [[id number]]   Continue all meds Labs pending Referral t0 ortho for wrist tendoinitis- continue voltaren until sees ortho Diet and exercise encourgaed Follow-up in 3 months  Mary-Margaret Daphine Deutscher, FNP

## 2012-10-07 NOTE — Patient Instructions (Signed)

## 2012-10-08 LAB — COMPLETE METABOLIC PANEL WITH GFR
Albumin: 3.9 g/dL (ref 3.5–5.2)
BUN: 25 mg/dL — ABNORMAL HIGH (ref 6–23)
CO2: 24 mEq/L (ref 19–32)
GFR, Est African American: >89 mL/min
GFR, Est Non African American: 89 mL/min
Glucose, Bld: 195 mg/dL — ABNORMAL HIGH (ref 70–99)
Potassium: 4.8 mEq/L (ref 3.5–5.3)
Sodium: 139 mEq/L (ref 135–145)
Total Bilirubin: 0.2 mg/dL — ABNORMAL LOW (ref 0.3–1.2)
Total Protein: 6.5 g/dL (ref 6.0–8.3)

## 2012-10-08 LAB — NMR LIPOPROFILE WITH LIPIDS
HDL Size: 9.4 nm (ref >=9.2)
LDL Particle Number: 1168 nmol/L — ABNORMAL HIGH (ref <1000)
LDL Size: 19.7 nm — ABNORMAL LOW (ref >20.5)
Large HDL-P: 10.8 umol/L (ref >=4.8)
Large VLDL-P: 4.2 nmol/L — ABNORMAL HIGH (ref <=2.7)

## 2012-10-29 ENCOUNTER — Ambulatory Visit (INDEPENDENT_AMBULATORY_CARE_PROVIDER_SITE_OTHER): Payer: Medicaid Other

## 2012-10-29 ENCOUNTER — Other Ambulatory Visit: Payer: Self-pay | Admitting: Family Medicine

## 2012-10-29 ENCOUNTER — Encounter: Payer: Self-pay | Admitting: Family Medicine

## 2012-10-29 ENCOUNTER — Other Ambulatory Visit: Payer: Self-pay

## 2012-10-29 ENCOUNTER — Ambulatory Visit (HOSPITAL_COMMUNITY)
Admission: RE | Admit: 2012-10-29 | Discharge: 2012-10-29 | Disposition: A | Discharge status: Home / Self Care | Payer: Medicaid Other | Source: Ambulatory Visit | Attending: Family Medicine | Admitting: Family Medicine

## 2012-10-29 ENCOUNTER — Ambulatory Visit (INDEPENDENT_AMBULATORY_CARE_PROVIDER_SITE_OTHER): Payer: Medicaid Other | Admitting: Family Medicine

## 2012-10-29 VITALS — BP 101/69 | HR 81 | Temp 97.1°F | Ht 61.0 in | Wt 158.0 lb

## 2012-10-29 DIAGNOSIS — R1011 Right upper quadrant pain: Secondary | ICD-10-CM

## 2012-10-29 DIAGNOSIS — R109 Unspecified abdominal pain: Secondary | ICD-10-CM

## 2012-10-29 DIAGNOSIS — F329 Major depressive disorder, single episode, unspecified: Secondary | ICD-10-CM

## 2012-10-29 DIAGNOSIS — R5381 Other malaise: Secondary | ICD-10-CM

## 2012-10-29 DIAGNOSIS — G8929 Other chronic pain: Secondary | ICD-10-CM

## 2012-10-29 DIAGNOSIS — Q619 Cystic kidney disease, unspecified: Secondary | ICD-10-CM | POA: Insufficient documentation

## 2012-10-29 DIAGNOSIS — R110 Nausea: Secondary | ICD-10-CM | POA: Insufficient documentation

## 2012-10-29 LAB — POCT UA - MICROSCOPIC ONLY
Bacteria, U Microscopic: NEGATIVE
Casts, Ur, LPF, POC: NEGATIVE
Crystals, Ur, HPF, POC: NEGATIVE
RBC, urine, microscopic: NEGATIVE
WBC, Ur, HPF, POC: NEGATIVE
Yeast, UA: NEGATIVE

## 2012-10-29 LAB — POCT URINALYSIS DIPSTICK
Bilirubin, UA: NEGATIVE
Blood, UA: NEGATIVE
Glucose, UA: NEGATIVE
Ketones, UA: NEGATIVE
Leukocytes, UA: NEGATIVE
Nitrite, UA: NEGATIVE
Protein, UA: 100
Spec Grav, UA: 1.025
Urobilinogen, UA: NEGATIVE
pH, UA: 6

## 2012-10-29 LAB — POCT CBC
Granulocyte percent: 57.2 %G (ref 37–80)
HCT, POC: 37.9 % (ref 37.7–47.9)
Hemoglobin: 12.7 g/dL (ref 12.2–16.2)
Lymph, poc: 2.1 (ref 0.6–3.4)
MCH, POC: 30.1 pg (ref 27–31.2)
MCHC: 33.6 g/dL (ref 31.8–35.4)
MCV: 89.5 fL (ref 80–97)
MPV: 8.5 fL (ref 0–99.8)
POC Granulocyte: 3.4 (ref 2–6.9)
POC LYMPH PERCENT: 35.5 %L (ref 10–50)
Platelet Count, POC: 183 10*3/uL (ref 142–424)
RBC: 4.2 M/uL (ref 4.04–5.48)
RDW, POC: 12.6 %
WBC: 6 10*3/uL (ref 4.6–10.2)

## 2012-10-29 MED ORDER — IOHEXOL 300 MG/ML  SOLN
100.0000 mL | Freq: Once | INTRAMUSCULAR | Status: AC | PRN
  Administered 2012-10-29: 100 mL via INTRAVENOUS

## 2012-10-29 NOTE — Progress Notes (Signed)
Subjective:    Patient ID: Charlotte Perez, female    DOB: 1961/01/20, 52 y.o.   MRN: [id number]  HPI  This 52 y.o. female presents for evaluation of right abdominal pain and back pain for the last 2 days. She c/o fatigue and malaise which has been an ongoing problem.  She states she is not sure if is Her depression.   She is not happy with her psychiatrist because they don't listen and just rx medicine According to patient.  She has been waking up in the am with nausea and right upper quadrant and epigastric Abdominal pain.  Review of Systems    No chest pain, SOB, HA, dizziness, vision change, N/V, diarrhea, constipation, dysuria, urinary urgency or frequency, myalgias, arthralgias or rash.  Objective:   Physical Exam  Vital signs noted  Pale depressed nourished female in NAD.  HEENT - Head atraumatic Normocephalic                Eyes - PERRLA, Conjuctiva - clear Sclera- Clear EOMI                Ears - EAC's Wnl TM's Wnl Gross Hearing WNL                Nose - Nares patent                 Throat - oropharanx wnl Respiratory - Lungs CTA bilateral Cardiac - RRR S1 and S2 without murmur GI - Abdomen tender right upper quadrant and epigastric region.  Positive Murphy's sign. Extremities - No edema. Neuro - Grossly intact.     Preliminary of KUB is airfilled loop of bowel right abdomen.  Stat read by Radiology with Air filled loop of bowel right mid abd to right pelvis suspicious for sigmoid colon colitis and no bowel obstruction seen.  Results for orders placed in visit on 10/29/12  POCT URINALYSIS DIPSTICK      Result Value Range   Color, UA yellow     Clarity, UA cloudy     Glucose, UA neg     Bilirubin, UA neg     Ketones, UA neg     Spec Grav, UA 1.025     Blood, UA neg     pH, UA 6.0     Protein, UA 100     Urobilinogen, UA negative     Nitrite, UA neg     Leukocytes, UA Negative    POCT UA - MICROSCOPIC ONLY      Result Value Range   WBC, Ur, HPF, POC neg      RBC, urine, microscopic neg     Bacteria, U Microscopic neg     Mucus, UA many     Epithelial cells, urine per micros many     Crystals, Ur, HPF, POC neg     Casts, Ur, LPF, POC neg     Yeast, UA neg    POCT CBC      Result Value Range   WBC 6.0  4.6 - 10.2 K/uL   Lymph, poc 2.1  0.6 - 3.4   POC LYMPH PERCENT 35.5  10 - 50 %L   POC Granulocyte 3.4  2 - 6.9   Granulocyte percent 57.2  37 - 80 %G   RBC 4.2  4.04 - 5.48 M/uL   Hemoglobin 12.7  12.2 - 16.2 g/dL   HCT, POC 40.9  81.1 - 47.9 %   MCV 89.5  80 - 97  fL   MCH, POC 30.1  27 - 31.2 pg   MCHC 33.6  31.8 - 35.4 g/dL   RDW, POC 16.1     Platelet Count, POC 183.0  142 - 424 K/uL   MPV 8.5  0 - 99.8 fL   Assessment & Plan:  Flank pain - Plan: POCT urinalysis dipstick, POCT UA - Microscopic Only, POCT CBC, Sedimentation rate, CMP14+EGFR, DG Abd 1 View  Abdominal pain, chronic, right upper quadrant - Plan: POCT CBC, Sedimentation rate, CMP14+EGFR, DG Abd 1 View, US Abdomen Limited RUQ, amylase and lipase.  KUB dx suspicious for colitis and CT of abdomen and pelvis w/ contrast ordered today at  [facility].  Other malaise and fatigue - Plan: POCT CBC, CMP14+EGFR, TSH  Depression - List of psychiatric providers is given to patient to follow up with.  Pending CT of abdomen and pelvis will decide disposition.  Informed patient that we will tell her what to do after STAT results Of CT are called by Radiologist.

## 2012-10-29 NOTE — Patient Instructions (Addendum)

## 2012-10-30 ENCOUNTER — Other Ambulatory Visit: Payer: Self-pay | Admitting: Family Medicine

## 2012-10-30 ENCOUNTER — Ambulatory Visit (HOSPITAL_COMMUNITY)
Admission: RE | Admit: 2012-10-30 | Discharge: 2012-10-30 | Disposition: A | Discharge status: Home / Self Care | Payer: Medicaid Other | Source: Ambulatory Visit | Attending: Family Medicine | Admitting: Family Medicine

## 2012-10-30 ENCOUNTER — Telehealth: Payer: Self-pay | Admitting: Family Medicine

## 2012-10-30 DIAGNOSIS — K838 Other specified diseases of biliary tract: Secondary | ICD-10-CM | POA: Insufficient documentation

## 2012-10-30 DIAGNOSIS — R1011 Right upper quadrant pain: Secondary | ICD-10-CM

## 2012-10-30 LAB — CMP14+EGFR
ALT: 11 IU/L (ref 0–32)
AST: 12 IU/L (ref 0–40)
Albumin/Globulin Ratio: 1.7 (ref 1.1–2.5)
Albumin: 4.1 g/dL (ref 3.5–5.5)
Alkaline Phosphatase: 47 IU/L (ref 39–117)
BUN/Creatinine Ratio: 28 — ABNORMAL HIGH (ref 9–23)
BUN: 20 mg/dL (ref 6–24)
CO2: 24 mmol/L (ref 18–29)
Calcium: 9.7 mg/dL (ref 8.7–10.2)
Chloride: 101 mmol/L (ref 97–108)
Creatinine, Ser: 0.72 mg/dL (ref 0.57–1.00)
GFR calc Af Amer: 111 mL/min/{1.73_m2} (ref >59)
GFR calc non Af Amer: 97 mL/min/{1.73_m2} (ref >59)
Globulin, Total: 2.4 g/dL (ref 1.5–4.5)
Glucose: 105 mg/dL — ABNORMAL HIGH (ref 65–99)
Potassium: 4.9 mmol/L (ref 3.5–5.2)
Sodium: 141 mmol/L (ref 134–144)
Total Bilirubin: 0.2 mg/dL (ref 0.0–1.2)
Total Protein: 6.5 g/dL (ref 6.0–8.5)

## 2012-10-30 LAB — AMYLASE: Amylase: 43 U/L (ref 31–124)

## 2012-10-30 LAB — SEDIMENTATION RATE: Sed Rate: 3 mm/hr (ref 0–40)

## 2012-10-30 LAB — LIPASE: Lipase: 15 U/L (ref 0–59)

## 2012-10-30 LAB — TSH: TSH: 2.11 u[IU]/mL (ref 0.450–4.500)

## 2012-10-30 NOTE — Telephone Encounter (Signed)
Ander Slade, FNP discussed with patient this morning and GB US ordered.

## 2012-11-02 ENCOUNTER — Encounter (INDEPENDENT_AMBULATORY_CARE_PROVIDER_SITE_OTHER): Payer: Medicaid Other | Admitting: Family Medicine

## 2012-11-02 DIAGNOSIS — K5900 Constipation, unspecified: Secondary | ICD-10-CM

## 2012-11-02 MED ORDER — LUBIPROSTONE 24 MCG PO CAPS: 24.0000 ug | ORAL_CAPSULE | Freq: Two times a day (BID) | ORAL | Status: DC

## 2012-11-02 MED ORDER — POLYETHYLENE GLYCOL 3350 17 GM/SCOOP PO POWD: 17.0000 g | Freq: Two times a day (BID) | ORAL | Status: DC | PRN

## 2012-11-03 ENCOUNTER — Encounter: Payer: Self-pay | Admitting: Family Medicine

## 2012-11-03 ENCOUNTER — Ambulatory Visit (INDEPENDENT_AMBULATORY_CARE_PROVIDER_SITE_OTHER): Payer: Medicaid Other | Admitting: Family Medicine

## 2012-11-03 VITALS — BP 113/77 | HR 94 | Temp 98.0°F | Wt 160.4 lb

## 2012-11-03 DIAGNOSIS — K5900 Constipation, unspecified: Secondary | ICD-10-CM

## 2012-11-03 DIAGNOSIS — IMO0002 Reserved for concepts with insufficient information to code with codable children: Secondary | ICD-10-CM

## 2012-11-03 MED ORDER — MELOXICAM 15 MG PO TABS: 15.0000 mg | ORAL_TABLET | Freq: Every day | ORAL | Status: DC

## 2012-11-03 MED ORDER — TRAMADOL HCL 50 MG PO TABS
50.0000 mg | ORAL_TABLET | Freq: Three times a day (TID) | ORAL | Status: DC | PRN
Start: 2012-11-03 — End: 2012-12-29

## 2012-11-03 NOTE — Patient Instructions (Addendum)
Back Pain, Adult Low back pain is very common. About 1 in 5 people have back pain.The cause of low back pain is rarely dangerous. The pain often gets better over time.About half of people with a sudden onset of back pain feel better in just 2 weeks. About 8 in 10 people feel better by 6 weeks.  CAUSES Some common causes of back pain include:  Strain of the muscles or ligaments supporting the spine.  Wear and tear (degeneration) of the spinal discs.  Arthritis.  Direct injury to the back. DIAGNOSIS Most of the time, the direct cause of low back pain is not known.However, back pain can be treated effectively even when the exact cause of the pain is unknown.Answering your caregiver's questions about your overall health and symptoms is one of the most accurate ways to make sure the cause of your pain is not dangerous. If your caregiver needs more information, he or she may order lab work or imaging tests (X-rays or MRIs).However, even if imaging tests show changes in your back, this usually does not require surgery. HOME CARE INSTRUCTIONS For many people, back pain returns.Since low back pain is rarely dangerous, it is often a condition that people can learn to manageon their own.   Remain active. It is stressful on the back to sit or stand in one place. Do not sit, drive, or stand in one place for more than 30 minutes at a time. Take short walks on level surfaces as soon as pain allows.Try to increase the length of time you walk each day.  Do not stay in bed.Resting more than 1 or 2 days can delay your recovery.  Do not avoid exercise or work.Your body is made to move.It is not dangerous to be active, even though your back may hurt.Your back will likely heal faster if you return to being active before your pain is gone.  Pay attention to your body when you bend and lift. Many people have less discomfortwhen lifting if they bend their knees, keep the load close to their bodies,and  avoid twisting. Often, the most comfortable positions are those that put less stress on your recovering back.  Find a comfortable position to sleep. Use a firm mattress and lie on your side with your knees slightly bent. If you lie on your back, put a pillow under your knees.  Only take over-the-counter or prescription medicines as directed by your caregiver. Over-the-counter medicines to reduce pain and inflammation are often the most helpful.Your caregiver may prescribe muscle relaxant drugs.These medicines help dull your pain so you can more quickly return to your normal activities and healthy exercise.  Put ice on the injured area.  Put ice in a plastic bag.  Place a towel between your skin and the bag.  Leave the ice on for 15-20 minutes, 3-4 times a day for the first 2 to 3 days. After that, ice and heat may be alternated to reduce pain and spasms.  Ask your caregiver about trying back exercises and gentle massage. This may be of some benefit.  Avoid feeling anxious or stressed.Stress increases muscle tension and can worsen back pain.It is important to recognize when you are anxious or stressed and learn ways to manage it.Exercise is a great option. SEEK MEDICAL CARE IF:  You have pain that is not relieved with rest or medicine.  You have pain that does not improve in 1 week.  You have new symptoms.  You are generally not feeling well. SEEK   IMMEDIATE MEDICAL CARE IF:   You have pain that radiates from your back into your legs.  You develop new bowel or bladder control problems.  You have unusual weakness or numbness in your arms or legs.  You develop nausea or vomiting.  You develop abdominal pain.  You feel faint. Document Released: 03/11/2005 Document Revised: 09/10/2011 Document Reviewed: 07/30/2010 [facility] Patient Information 2014 [place], [place]. Constipation, Adult Constipation is when a person has fewer than 3 bowel movements a week; has difficulty having  a bowel movement; or has stools that are dry, hard, or larger than normal. As people grow older, constipation is more common. If you try to fix constipation with medicines that make you have a bowel movement (laxatives), the problem may get worse. Long-term laxative use may cause the muscles of the colon to become weak. A low-fiber diet, not taking in enough fluids, and taking certain medicines may make constipation worse. CAUSES   Certain medicines, such as antidepressants, pain medicine, iron supplements, antacids, and water pills.   Certain diseases, such as diabetes, irritable bowel syndrome (IBS), thyroid disease, or depression.   Not drinking enough water.   Not eating enough fiber-rich foods.   Stress or travel.  Lack of physical activity or exercise.  Not going to the restroom when there is the urge to have a bowel movement.  Ignoring the urge to have a bowel movement.  Using laxatives too much. SYMPTOMS   Having fewer than 3 bowel movements a week.   Straining to have a bowel movement.   Having hard, dry, or larger than normal stools.   Feeling full or bloated.   Pain in the lower abdomen.  Not feeling relief after having a bowel movement. DIAGNOSIS  Your caregiver will take a medical history and perform a physical exam. Further testing may be done for severe constipation. Some tests may include:   A barium enema X-ray to examine your rectum, colon, and sometimes, your small intestine.  A sigmoidoscopy to examine your lower colon.  A colonoscopy to examine your entire colon. TREATMENT  Treatment will depend on the severity of your constipation and what is causing it. Some dietary treatments include drinking more fluids and eating more fiber-rich foods. Lifestyle treatments may include regular exercise. If these diet and lifestyle recommendations do not help, your caregiver may recommend taking over-the-counter laxative medicines to help you have bowel  movements. Prescription medicines may be prescribed if over-the-counter medicines do not work.  HOME CARE INSTRUCTIONS   Increase dietary fiber in your diet, such as fruits, vegetables, whole grains, and beans. Limit high-fat and processed sugars in your diet, such as [place] fries, hamburgers, cookies, candies, and soda.   A fiber supplement may be added to your diet if you cannot get enough fiber from foods.   Drink enough fluids to keep your urine clear or pale yellow.   Exercise regularly or as directed by your caregiver.   Go to the restroom when you have the urge to go. Do not hold it.  Only take medicines as directed by your caregiver. Do not take other medicines for constipation without talking to your caregiver first. SEEK IMMEDIATE MEDICAL CARE IF:   You have bright red blood in your stool.   Your constipation lasts for more than 4 days or gets worse.   You have abdominal or rectal pain.   You have thin, pencil-like stools.  You have unexplained weight loss. MAKE SURE YOU:   Understand these instructions.  Will  watch your condition.  Will get help right away if you are not doing well or get worse. Document Released: 12/08/2003 Document Revised: 06/03/2011 Document Reviewed: 02/12/2011 [facility] Patient Information 2014 [place], [place].

## 2012-11-03 NOTE — Progress Notes (Signed)
  Subjective:    Patient ID: Charlotte Perez, female    DOB: 02-11-1961, 52 y.o.   MRN: [id number]  HPI  This 52 y.o. female presents for evaluation of abdominal pain.  She has been having nausea Abdominal pain and fatigue for a few weeks now. She states she actually feels a lot better after Having some Bm's.  She has been having problems with constipation.  She has had abdominal And Pelvic CT of abdomen and pelvis and it was normal and she had GB US which showed  Sludge in herGB  But no stones.  She has been rx'd [place] and miralax and is now having daily BM's. She has hx of DDD of the LS spine and she has chronic back pain.  She has been taking Lortab 5mg  bid and this has contributed to her back pain.  She wants to get on a pain regimen that Doesn't constipate her.  Review of Systems    No chest pain, SOB, HA, dizziness, vision change, N/V, diarrhea, constipation, dysuria, urinary urgency or frequency, myalgias, arthralgias or rash.  Objective:   Physical Exam Vital signs noted  Well developed well nourished female.  HEENT - Head atraumatic Normocephalic                Throat - oropharanx wnl Respiratory - Lungs CTA bilateral Cardiac - RRR S1 and S2 without murmur GI - Abdomen soft Nontender and bowel sounds active x 4.       Assessment & Plan:  DDD (degenerative disc disease) - Plan: traMADol (ULTRAM) 50 MG tablet, meloxicam (MOBIC) 15 MG tablet Follow up prn if not better.  Discussed that I don't think the lortab is helping out with her constipation and abdominal pain.  Unspecified constipation - Continue Amitiza and miralax.  Follow up prn.  DM - She reports her FSBS fasting is running in the 130's,  Discussed she follow up in 2 months.

## 2012-11-09 ENCOUNTER — Telehealth: Payer: Self-pay | Admitting: *Deleted

## 2012-11-09 NOTE — Telephone Encounter (Signed)
error 

## 2012-11-09 NOTE — Progress Notes (Signed)
Pt aware of results/ch

## 2012-11-09 NOTE — Progress Notes (Signed)
Pt aware/ch

## 2012-11-09 NOTE — Progress Notes (Signed)
Pt aware of labs/ch

## 2012-11-17 ENCOUNTER — Emergency Department (HOSPITAL_COMMUNITY)
Admission: EM | Admit: 2012-11-17 | Discharge: 2012-11-17 | Disposition: A | Discharge status: Home / Self Care | Payer: Medicaid Other | Attending: Emergency Medicine | Admitting: Emergency Medicine

## 2012-11-17 ENCOUNTER — Encounter (HOSPITAL_COMMUNITY): Payer: Self-pay | Admitting: Emergency Medicine

## 2012-11-17 DIAGNOSIS — R05 Cough: Secondary | ICD-10-CM | POA: Insufficient documentation

## 2012-11-17 DIAGNOSIS — J4489 Other specified chronic obstructive pulmonary disease: Secondary | ICD-10-CM | POA: Insufficient documentation

## 2012-11-17 DIAGNOSIS — Z85828 Personal history of other malignant neoplasm of skin: Secondary | ICD-10-CM | POA: Insufficient documentation

## 2012-11-17 DIAGNOSIS — Z872 Personal history of diseases of the skin and subcutaneous tissue: Secondary | ICD-10-CM | POA: Insufficient documentation

## 2012-11-17 DIAGNOSIS — E785 Hyperlipidemia, unspecified: Secondary | ICD-10-CM | POA: Insufficient documentation

## 2012-11-17 DIAGNOSIS — R059 Cough, unspecified: Secondary | ICD-10-CM | POA: Insufficient documentation

## 2012-11-17 DIAGNOSIS — R0602 Shortness of breath: Secondary | ICD-10-CM | POA: Insufficient documentation

## 2012-11-17 DIAGNOSIS — K219 Gastro-esophageal reflux disease without esophagitis: Secondary | ICD-10-CM

## 2012-11-17 DIAGNOSIS — Z8669 Personal history of other diseases of the nervous system and sense organs: Secondary | ICD-10-CM | POA: Insufficient documentation

## 2012-11-17 DIAGNOSIS — F319 Bipolar disorder, unspecified: Secondary | ICD-10-CM | POA: Insufficient documentation

## 2012-11-17 DIAGNOSIS — J449 Chronic obstructive pulmonary disease, unspecified: Secondary | ICD-10-CM | POA: Insufficient documentation

## 2012-11-17 DIAGNOSIS — E860 Dehydration: Secondary | ICD-10-CM

## 2012-11-17 DIAGNOSIS — R112 Nausea with vomiting, unspecified: Secondary | ICD-10-CM

## 2012-11-17 DIAGNOSIS — E119 Type 2 diabetes mellitus without complications: Secondary | ICD-10-CM | POA: Insufficient documentation

## 2012-11-17 DIAGNOSIS — R109 Unspecified abdominal pain: Secondary | ICD-10-CM | POA: Insufficient documentation

## 2012-11-17 DIAGNOSIS — Z79899 Other long term (current) drug therapy: Secondary | ICD-10-CM | POA: Insufficient documentation

## 2012-11-17 LAB — CBC WITH DIFFERENTIAL/PLATELET
Eosinophils Relative: 8 % — ABNORMAL HIGH (ref 0–5)
Lymphocytes Relative: 35 % (ref 12–46)
Lymphs Abs: 1.8 10*3/uL (ref 0.7–4.0)
MCV: 92 fL (ref 78.0–100.0)
Neutro Abs: 2.4 10*3/uL (ref 1.7–7.7)
Neutrophils Relative %: 47 % (ref 43–77)
Platelets: 213 10*3/uL (ref 150–400)
RBC: 3.99 MIL/uL (ref 3.87–5.11)
WBC: 5.1 10*3/uL (ref 4.0–10.5)

## 2012-11-17 LAB — COMPREHENSIVE METABOLIC PANEL
ALT: 9 U/L (ref 0–35)
Alkaline Phosphatase: 52 U/L (ref 39–117)
CO2: 30 mEq/L (ref 19–32)
Chloride: 102 mEq/L (ref 96–112)
GFR calc Af Amer: >90 mL/min (ref >90)
GFR calc non Af Amer: >90 mL/min (ref >90)
Glucose, Bld: 158 mg/dL — ABNORMAL HIGH (ref 70–99)
Potassium: 4.1 mEq/L (ref 3.5–5.1)
Sodium: 140 mEq/L (ref 135–145)
Total Bilirubin: <0.1 mg/dL — ABNORMAL LOW (ref 0.3–1.2)

## 2012-11-17 LAB — URINALYSIS, ROUTINE W REFLEX MICROSCOPIC
Bilirubin Urine: NEGATIVE
Glucose, UA: NEGATIVE mg/dL
Hgb urine dipstick: NEGATIVE
Protein, ur: NEGATIVE mg/dL
Urobilinogen, UA: 0.2 mg/dL (ref 0.0–1.0)

## 2012-11-17 MED ORDER — OMEPRAZOLE 20 MG PO CPDR: DELAYED_RELEASE_CAPSULE | ORAL | Status: DC

## 2012-11-17 MED ORDER — METOCLOPRAMIDE HCL 5 MG/ML IJ SOLN
10.0000 mg | Freq: Once | INTRAMUSCULAR | Status: AC
  Administered 2012-11-17: 10 mg via INTRAVENOUS
  Filled 2012-11-17: qty 2

## 2012-11-17 MED ORDER — ONDANSETRON HCL 4 MG PO TABS: 4.0000 mg | ORAL_TABLET | Freq: Four times a day (QID) | ORAL | Status: DC | PRN

## 2012-11-17 MED ORDER — ONDANSETRON 4 MG PO TBDP
ORAL_TABLET | ORAL | Status: AC
  Administered 2012-11-17: 4 mg via ORAL
  Filled 2012-11-17: qty 1

## 2012-11-17 MED ORDER — DIPHENHYDRAMINE HCL 50 MG/ML IJ SOLN
25.0000 mg | Freq: Once | INTRAMUSCULAR | Status: AC
  Administered 2012-11-17: 25 mg via INTRAVENOUS
  Filled 2012-11-17: qty 1

## 2012-11-17 MED ORDER — SODIUM CHLORIDE 0.9 % IV SOLN
1000.0000 mL | Freq: Once | INTRAVENOUS | Status: AC
  Administered 2012-11-17: 1000 mL via INTRAVENOUS

## 2012-11-17 MED ORDER — FAMOTIDINE IN NACL 20-0.9 MG/50ML-% IV SOLN
20.0000 mg | Freq: Once | INTRAVENOUS | Status: AC
  Administered 2012-11-17: 20 mg via INTRAVENOUS
  Filled 2012-11-17: qty 50

## 2012-11-17 MED ORDER — SODIUM CHLORIDE 0.9 % IV SOLN: 1000.0000 mL | INTRAVENOUS | Status: DC

## 2012-11-17 MED ORDER — ONDANSETRON 4 MG PO TBDP: 4.0000 mg | ORAL_TABLET | Freq: Once | ORAL | Status: AC

## 2012-11-17 MED ORDER — GI COCKTAIL ~~LOC~~
30.0000 mL | Freq: Once | ORAL | Status: AC
  Administered 2012-11-17: 30 mL via ORAL
  Filled 2012-11-17: qty 30

## 2012-11-17 NOTE — ED Provider Notes (Signed)
CSN: [id number]     Arrival date & time 11/17/12  0541 History   This chart was scribed for Ward Givens, MD,  by Ashley Jacobs, ED Scribe. The patient was seen in room APA10/APA10 and the patient's care was started at 7:37 AM   First MD Initiated Contact with Patient 11/17/12 0700     Chief Complaint  Patient presents with  . Emesis  . Abdominal Pain   (Consider location/radiation/quality/duration/timing/severity/associated sxs/prior Treatment) HPI HPI Comments: Charlotte Perez is a 52 y.o. female who presents to the Emergency Department complaining of upper abdominal pain for the past week. She states it's a burning and pressure pain and she states it's located in the epigastric area. She states it radiates up into her throat and when she has vomiting she has a burning in her throat. She states 5 times today with inial onset of yesterday. She denies diarrhea. She states any food makes the pain worse. She's tried Pepto-Bismol without relief. Nothing makes it feel better. She also reports she has some shortness of breath yesterday and used a nebulizer and then she started coughing up some phlegm that was green. She does not smoke but her husband is a heavy smoker. She denies any fever. She reports she is taking Prilosec once a day. She has never had endoscopy done.   [facility]    Patient is a 52 y.o. female presenting with vomiting and abdominal pain. The history is provided by the patient and medical records. No language interpreter was used.  Emesis Severity:  Moderate Duration:  1 day Timing:  Constant Number of daily episodes:  5 Progression:  Worsening Relieved by:  Nothing Associated symptoms: abdominal pain and cough   Associated symptoms: no diarrhea and no fever   Risk factors: diabetes   Abdominal Pain Associated symptoms: vomiting   Associated symptoms: no diarrhea and no fever      Past Medical History  Diagnosis Date  . Bipolar disorder   . Asthma    . DDD (degenerative disc disease), cervical   . DDD (degenerative disc disease), lumbar   . Hypertension   . Shingles   . Hyperlipidemia   . GERD (gastroesophageal reflux disease)   . Skin lesions, generalized     1.2 CM FLAT FAWN COLOR AT THE T10 AREA JUST RIGHT OF SPINE   . Neuropathy of foot     Feet   . Mole (skin)     pt. thinks its a tick   . Diabetes mellitus without complication   . COPD (chronic obstructive pulmonary disease)   . Depression    Past Surgical History  Procedure Laterality Date  . Cesarean section    . Back surgery    . Abdominal hysterectomy    . Neck surgery     Family History  Problem Relation Age of Onset  . COPD Father    History  Substance Use Topics  . Smoking status: Never Smoker   . Smokeless tobacco: Never Used  . Alcohol Use: No   Lives at home Lives with spoue + heavy second hand smoke  OB History   Grav Para Term Preterm Abortions TAB SAB Ect Mult Living                 Review of Systems  Constitutional: Negative for fever.  Gastrointestinal: Positive for vomiting and abdominal pain. Negative for diarrhea.  All other systems reviewed and are negative.    Allergies  Gabapentin  Home  Medications   Current Outpatient Rx  Name  Route  Sig  Dispense  Refill  . diclofenac (VOLTAREN) 50 MG EC tablet   Oral   Take 50 mg by mouth 2 (two) times daily.         . promethazine (PHENERGAN) 25 MG tablet   Oral   Take 25 mg by mouth every 6 (six) hours as needed for nausea.         Marland Kitchen estradiol (ESTRACE) 0.5 MG tablet   Oral   Take 1 tablet (0.5 mg total) by mouth 2 (two) times daily.   60 tablet   5   . glucose blood (ONE TOUCH ULTRA TEST) test strip      Test 1X per and prn-- dx 250.02   100 each   12   . lisinopril (PRINIVIL,ZESTRIL) 10 MG tablet   Oral   Take 1 tablet (10 mg total) by mouth daily.   30 tablet   5   . lubiprostone (AMITIZA) 24 MCG capsule   Oral   Take 1 capsule (24 mcg total) by mouth 2  (two) times daily with a meal.   60 capsule   5   . meloxicam (MOBIC) 15 MG tablet   Oral   Take 1 tablet (15 mg total) by mouth at bedtime.   30 tablet   11   . metFORMIN (GLUCOPHAGE) 1000 MG tablet   Oral   Take 1 tablet (1,000 mg total) by mouth 2 (two) times daily with a meal.   60 tablet   2   . omeprazole (PRILOSEC) 20 MG capsule   Oral   Take 1 capsule (20 mg total) by mouth daily.   30 capsule   2   . ONETOUCH DELICA LANCETS FINE MISC   Does not apply   1 each by Does not apply route daily. Patient test 1X per day and prn-- dx-250.02   100 each   11   . pioglitazone (ACTOS) 15 MG tablet   Oral   Take 1 tablet (15 mg total) by mouth daily.   30 tablet   1   . polyethylene glycol powder (GLYCOLAX/MIRALAX) powder   Oral   Take 17 g by mouth 2 (two) times daily as needed.   3350 g   11   . simvastatin (ZOCOR) 40 MG tablet   Oral   Take 1 tablet (40 mg total) by mouth every evening.   30 tablet   2   . traMADol (ULTRAM) 50 MG tablet   Oral   Take 1 tablet (50 mg total) by mouth every 8 (eight) hours as needed for pain.   90 tablet   3   . venlafaxine XR (EFFEXOR-XR) 75 MG 24 hr capsule   Oral   Take 3 capsules (225 mg total) by mouth at bedtime.   30 capsule   5    BP 133/81  Pulse 75  Temp(Src) 97.6 F (36.4 C) (Oral)  Resp 16  Ht 5\' 1"  (1.549 m)  Wt 158 lb (71.668 kg)  BMI 29.87 kg/m2  SpO2 98%  Vital signs normal   Physical Exam  Nursing note and vitals reviewed. Constitutional: She is oriented to person, place, and time. She appears well-developed and well-nourished.  Non-toxic appearance. She does not appear ill. No distress.  HENT:  Head: Normocephalic and atraumatic.  Right Ear: External ear normal.  Left Ear: External ear normal.  Nose: Nose normal. No mucosal edema or rhinorrhea.  Mouth/Throat: Oropharynx  is clear and moist and mucous membranes are normal. No dental abscesses or edematous.  Eyes: Conjunctivae and EOM are  normal. Pupils are equal, round, and reactive to light.  Neck: Normal range of motion and full passive range of motion without pain. Neck supple.  Cardiovascular: Normal rate, regular rhythm and normal heart sounds.  Exam reveals no gallop and no friction rub.   No murmur heard. Pulmonary/Chest: Effort normal and breath sounds normal. No respiratory distress. She has no wheezes. She has no rhonchi. She has no rales. She exhibits no tenderness and no crepitus.  Abdominal: Soft. Normal appearance and bowel sounds are normal. She exhibits no distension. There is tenderness. There is no rebound and no guarding.    Left sided tenderness in epigastric region  Tender diffusely across upper abdomen.  Mild tenderness over the supra pubic area.   Musculoskeletal: Normal range of motion. She exhibits no edema and no tenderness.  Moves all extremities well.   Neurological: She is alert and oriented to person, place, and time. She has normal strength. No cranial nerve deficit.  Skin: Skin is warm, dry and intact. No rash noted. No erythema. No pallor.  Psychiatric: She has a normal mood and affect. Her speech is normal and behavior is normal. Her mood appears not anxious.    ED Course  Procedures (including critical care time)  Medications  ondansetron (ZOFRAN-ODT) disintegrating tablet 4 mg (4 mg Oral Given 11/17/12 0704)  0.9 %  sodium chloride infusion (0 mLs Intravenous Stopped 11/17/12 0941)    Followed by  0.9 %  sodium chloride infusion (0 mLs Intravenous Stopped 11/17/12 1056)  metoCLOPramide (REGLAN) injection 10 mg (10 mg Intravenous Given 11/17/12 0812)  diphenhydrAMINE (BENADRYL) injection 25 mg (25 mg Intravenous Given 11/17/12 0812)  famotidine (PEPCID) IVPB 20 mg (0 mg Intravenous Stopped 11/17/12 0900)  gi cocktail (Maalox,Lidocaine,Donnatal) (30 mLs Oral Given 11/17/12 4098)    DIAGNOSTIC STUDIES: Oxygen Saturation is 98% on room air, norrmal by my interpretation.    COORDINATION OF  CARE: 7:45 AM Discussed course of care with pt Reglan, Benedryl, Maalox, Lidocain, Donnatal, Zofran-ODT and blood work . Pt understands and agrees.  9:00 AM Pt mentions feeling better. Pt still present with dry tounge. She has almost finished her first liter of NS.   10:28 AM Pt states the is feeling better and is ready for discharge. She has finished her second liter of NS   Review of her record shows her PCP started her on the amitza for c/o constipation which was felt to be worsened by the vicodin she was taking.    Results for orders placed during the hospital encounter of 11/17/12  CBC WITH DIFFERENTIAL      Result Value Range   WBC 5.1  4.0 - 10.5 K/uL   RBC 3.99  3.87 - 5.11 MIL/uL   Hemoglobin 12.2  12.0 - 15.0 g/dL   HCT 11.9  14.7 - 82.9 %   MCV 92.0  78.0 - 100.0 fL   MCH 30.6  26.0 - 34.0 pg   MCHC 33.2  30.0 - 36.0 g/dL   RDW 56.2  13.0 - 86.5 %   Platelets 213  150 - 400 K/uL   Neutrophils Relative % 47  43 - 77 %   Neutro Abs 2.4  1.7 - 7.7 K/uL   Lymphocytes Relative 35  12 - 46 %   Lymphs Abs 1.8  0.7 - 4.0 K/uL   Monocytes Relative 10  3 -  12 %   Monocytes Absolute 0.5  0.1 - 1.0 K/uL   Eosinophils Relative 8 (*) 0 - 5 %   Eosinophils Absolute 0.4  0.0 - 0.7 K/uL   Basophils Relative 0  0 - 1 %   Basophils Absolute 0.0  0.0 - 0.1 K/uL  COMPREHENSIVE METABOLIC PANEL      Result Value Range   Sodium 140  135 - 145 mEq/L   Potassium 4.1  3.5 - 5.1 mEq/L   Chloride 102  96 - 112 mEq/L   CO2 30  19 - 32 mEq/L   Glucose, Bld 158 (*) 70 - 99 mg/dL   BUN 11  6 - 23 mg/dL   Creatinine, Ser 1.61  0.50 - 1.10 mg/dL   Calcium 9.1  8.4 - 09.6 mg/dL   Total Protein 6.9  6.0 - 8.3 g/dL   Albumin 3.1 (*) 3.5 - 5.2 g/dL   AST 10  0 - 37 U/L   ALT 9  0 - 35 U/L   Alkaline Phosphatase 52  39 - 117 U/L   Total Bilirubin <0.1 (*) 0.3 - 1.2 mg/dL   GFR calc non Af Amer >90  >90 mL/min   GFR calc Af Amer >90  >90 mL/min  LIPASE, BLOOD      Result Value Range   Lipase  36  11 - 59 U/L  URINALYSIS, ROUTINE W REFLEX MICROSCOPIC      Result Value Range   Color, Urine YELLOW  YELLOW   APPearance CLEAR  CLEAR   Specific Gravity, Urine >1.030 (*) 1.005 - 1.030   pH 6.0  5.0 - 8.0   Glucose, UA NEGATIVE  NEGATIVE mg/dL   Hgb urine dipstick NEGATIVE  NEGATIVE   Bilirubin Urine NEGATIVE  NEGATIVE   Ketones, ur NEGATIVE  NEGATIVE mg/dL   Protein, ur NEGATIVE  NEGATIVE mg/dL   Urobilinogen, UA 0.2  0.0 - 1.0 mg/dL   Nitrite NEGATIVE  NEGATIVE   Leukocytes, UA NEGATIVE  NEGATIVE   Laboratory interpretation all normal except hyperglycemia   Dg Abd 1 View  10/29/2012  IMPRESSION: Bowel wall thickening of a bowel loop in the right mid abdomen and right pelvis is identified suspicious for colitis of the sigmoid colon; consider CT imaging with IV and oral contrast for further evaluation.  Findings discussed with Aurea Graff at [facility] on 10/29/2012 at 1223 hours.   Original Report Authenticated By: Ulyses Southward, M.D.   Ct Abdomen Pelvis W Contrast  10/29/2012    IMPRESSION: Bilateral peripelvic renal cysts unchanged from prior CT. No acute intra-abdominal or intrapelvic abnormalities identified. Specifically no evidence of sigmoid colitis or other acute intra- abdominal process identified.   Original Report Authenticated By: Ulyses Southward, M.D.   US Abdomen Limited Ruq  10/30/2012   IMPRESSION: Gallbladder sludge without evidence for gallstones or gallbladder wall thickening.                    Original Report Authenticated By: Kennith Center, M.D.     MDM   1. GERD (gastroesophageal reflux disease)   2. Nausea and vomiting   3. Dehydration     Discharge Medication List as of 11/17/2012 10:43 AM    START taking these medications   Details  !! omeprazole (PRILOSEC) 20 MG capsule Take 1 po BID x 2 weeks then once a day, Print    ondansetron (ZOFRAN) 4 MG tablet Take 1 tablet (4 mg total) by mouth every 6 (six) hours as needed  for nausea., Starting 11/17/2012, Until Discontinued,  Print     !! - Potential duplicate medications found. Please discuss with provider.      Plan discharge   Devoria Albe, MD, FACEP    I personally performed the services described in this documentation, which was scribed in my presence. The recorded information has been reviewed and considered.  Devoria Albe, MD, Armando Gang     Ward Givens, MD 11/17/12 [phone]

## 2012-11-17 NOTE — ED Notes (Addendum)
Patient complaining of upper abdominal pain and vomiting since yesterday. States "the doctor gave me phenergan but my husband don't want me to take it because it makes me sleep all the time."

## 2012-11-26 NOTE — Progress Notes (Signed)
  Subjective:    Patient ID: Charlotte Perez, female    DOB: 07-28-1960, 52 y.o.   MRN: [id number]  HPI    Review of Systems     Objective:   Physical Exam        Assessment & Plan:

## 2012-11-26 NOTE — Progress Notes (Signed)
Patient not seen - error  

## 2012-12-29 ENCOUNTER — Ambulatory Visit (INDEPENDENT_AMBULATORY_CARE_PROVIDER_SITE_OTHER): Payer: MEDICAID | Admitting: Psychiatry

## 2012-12-29 ENCOUNTER — Encounter (HOSPITAL_COMMUNITY): Payer: Self-pay | Admitting: Psychiatry

## 2012-12-29 VITALS — BP 118/90 | Ht 61.0 in | Wt 161.0 lb

## 2012-12-29 DIAGNOSIS — F329 Major depressive disorder, single episode, unspecified: Secondary | ICD-10-CM | POA: Insufficient documentation

## 2012-12-29 DIAGNOSIS — F988 Other specified behavioral and emotional disorders with onset usually occurring in childhood and adolescence: Secondary | ICD-10-CM | POA: Insufficient documentation

## 2012-12-29 DIAGNOSIS — F4310 Post-traumatic stress disorder, unspecified: Secondary | ICD-10-CM

## 2012-12-29 DIAGNOSIS — F32A Depression, unspecified: Secondary | ICD-10-CM | POA: Insufficient documentation

## 2012-12-29 DIAGNOSIS — F332 Major depressive disorder, recurrent severe without psychotic features: Secondary | ICD-10-CM

## 2012-12-29 HISTORY — DX: Other specified behavioral and emotional disorders with onset usually occurring in childhood and adolescence: F98.8

## 2012-12-29 MED ORDER — VENLAFAXINE HCL ER 150 MG PO CP24: 150.0000 mg | ORAL_CAPSULE | Freq: Two times a day (BID) | ORAL | Status: DC

## 2012-12-29 MED ORDER — METHYLPHENIDATE HCL 10 MG PO TABS: 10.0000 mg | ORAL_TABLET | Freq: Two times a day (BID) | ORAL | Status: DC

## 2012-12-29 MED ORDER — VENLAFAXINE HCL ER 75 MG PO CP24: 150.0000 mg | ORAL_CAPSULE | Freq: Every day | ORAL | Status: DC
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
## 2013-01-11 ENCOUNTER — Encounter: Payer: Self-pay | Admitting: Family Medicine

## 2013-01-11 ENCOUNTER — Ambulatory Visit (INDEPENDENT_AMBULATORY_CARE_PROVIDER_SITE_OTHER): Payer: Medicaid Other | Admitting: Family Medicine

## 2013-01-11 ENCOUNTER — Ambulatory Visit: Payer: Medicaid Other | Admitting: Nurse Practitioner

## 2013-01-11 VITALS — BP 126/82 | HR 90 | Temp 97.4°F | Ht 61.0 in | Wt 163.0 lb

## 2013-01-11 DIAGNOSIS — E785 Hyperlipidemia, unspecified: Secondary | ICD-10-CM

## 2013-01-11 DIAGNOSIS — E119 Type 2 diabetes mellitus without complications: Secondary | ICD-10-CM

## 2013-01-11 DIAGNOSIS — M549 Dorsalgia, unspecified: Secondary | ICD-10-CM

## 2013-01-11 LAB — POCT CBC
Granulocyte percent: 59.1 %G (ref 37–80)
HCT, POC: 36.5 % — AB (ref 37.7–47.9)
Hemoglobin: 12.1 g/dL — AB (ref 12.2–16.2)
Lymph, poc: 2.2 (ref 0.6–3.4)
MCH, POC: 29.4 pg (ref 27–31.2)
MCHC: 33 g/dL (ref 31.8–35.4)
MCV: 89.1 fL (ref 80–97)
MPV: 9.5 fL (ref 0–99.8)
POC Granulocyte: 3.6 (ref 2–6.9)
POC LYMPH PERCENT: 36 %L (ref 10–50)
Platelet Count, POC: 201 10*3/uL (ref 142–424)
RBC: 4.1 M/uL (ref 4.04–5.48)
RDW, POC: 12.4 %
WBC: 6.1 10*3/uL (ref 4.6–10.2)

## 2013-01-11 LAB — POCT GLYCOSYLATED HEMOGLOBIN (HGB A1C): Hemoglobin A1C: 6.6

## 2013-01-11 MED ORDER — SIMVASTATIN 40 MG PO TABS: 40.0000 mg | ORAL_TABLET | Freq: Every evening | ORAL | Status: DC

## 2013-01-11 MED ORDER — METFORMIN HCL 1000 MG PO TABS: 1000.0000 mg | ORAL_TABLET | Freq: Two times a day (BID) | ORAL | Status: DC

## 2013-01-11 MED ORDER — PIOGLITAZONE HCL 15 MG PO TABS: 15.0000 mg | ORAL_TABLET | Freq: Every day | ORAL | Status: DC

## 2013-01-11 NOTE — Addendum Note (Signed)
Addended by: Orma Render F on: 01/11/2013 02:00 PM   Modules accepted: Orders

## 2013-01-11 NOTE — Progress Notes (Signed)
  Subjective:    Patient ID: Charlotte Perez, female    DOB: 1960/09/09, 52 y.o.   MRN: [id number]  HPI This 52 y.o. female presents for evaluation of diabetes and c/o chronic back pain.  She states she is having Pain 9/10 daily and the tramadol is not working.  She has been doing fine otherwise.  She states that Hydrocodone Has helped before with her pain.  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   Review of Systems C/o back pain No chest pain, SOB, HA, dizziness, vision change, N/V, diarrhea, constipation, dysuria, urinary urgency or frequency or rash.     Objective:   Physical Exam Vital signs noted  Well developed well nourished female.  HEENT - Head atraumatic Normocephalic                Eyes - PERRLA, Conjuctiva - clear Sclera- Clear EOMI                Ears - EAC's Wnl TM's Wnl Gross Hearing WNL                Nose - Nares patent                 Throat - oropharanx wnl Respiratory - Lungs CTA bilateral Cardiac - RRR S1 and S2 without murmur GI - Abdomen soft Nontender and bowel sounds active x 4 Extremities - No edema. Neuro - Grossly intact.       Assessment & Plan:  Hyperlipidemia - Plan: simvastatin (ZOCOR) 40 MG tablet  DM (diabetes mellitus) - Plan: pioglitazone (ACTOS) 15 MG tablet, metFORMIN (GLUCOPHAGE) 1000 MG tablet, POCT CBC, CMP14+EGFR, POCT glycosylated hemoglobin (Hb A1C)  Back pain - Plan: Pain Management Screening Profile (10S)  She declines refills on tramadol stating it doesn't help.  Deatra Canter FNP

## 2013-01-11 NOTE — Patient Instructions (Signed)

## 2013-01-12 LAB — CMP14+EGFR
ALT: 11 IU/L (ref 0–32)
AST: 9 IU/L (ref 0–40)
Albumin/Globulin Ratio: 1.8 (ref 1.1–2.5)
Albumin: 3.9 g/dL (ref 3.5–5.5)
Alkaline Phosphatase: 48 IU/L (ref 39–117)
BUN/Creatinine Ratio: 23 (ref 9–23)
BUN: 13 mg/dL (ref 6–24)
CO2: 24 mmol/L (ref 18–29)
Calcium: 9.4 mg/dL (ref 8.7–10.2)
Chloride: 102 mmol/L (ref 97–108)
Creatinine, Ser: 0.57 mg/dL (ref 0.57–1.00)
GFR calc Af Amer: 122 mL/min/{1.73_m2} (ref >59)
GFR calc non Af Amer: 106 mL/min/{1.73_m2} (ref >59)
Globulin, Total: 2.2 g/dL (ref 1.5–4.5)
Glucose: 80 mg/dL (ref 65–99)
Potassium: 4.3 mmol/L (ref 3.5–5.2)
Sodium: 140 mmol/L (ref 134–144)
Total Bilirubin: <0.2 mg/dL (ref 0.0–1.2)
Total Protein: 6.1 g/dL (ref 6.0–8.5)

## 2013-01-25 ENCOUNTER — Ambulatory Visit (HOSPITAL_COMMUNITY): Payer: Self-pay | Admitting: Psychiatry

## 2013-01-26 ENCOUNTER — Ambulatory Visit (INDEPENDENT_AMBULATORY_CARE_PROVIDER_SITE_OTHER): Payer: MEDICAID | Admitting: Psychiatry

## 2013-01-26 ENCOUNTER — Encounter (HOSPITAL_COMMUNITY): Payer: Self-pay | Admitting: Psychiatry

## 2013-01-26 VITALS — BP 120/80 | Ht 61.0 in | Wt 160.0 lb

## 2013-01-26 DIAGNOSIS — F329 Major depressive disorder, single episode, unspecified: Secondary | ICD-10-CM

## 2013-01-26 DIAGNOSIS — F988 Other specified behavioral and emotional disorders with onset usually occurring in childhood and adolescence: Secondary | ICD-10-CM

## 2013-01-26 DIAGNOSIS — F4310 Post-traumatic stress disorder, unspecified: Secondary | ICD-10-CM

## 2013-01-26 DIAGNOSIS — F332 Major depressive disorder, recurrent severe without psychotic features: Secondary | ICD-10-CM

## 2013-01-26 MED ORDER — METHYLPHENIDATE HCL 20 MG PO TABS: 20.0000 mg | ORAL_TABLET | Freq: Two times a day (BID) | ORAL | Status: DC
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
## 2013-01-27 LAB — PRESCRIPTION ABUSE MONITORING 17P, URINE
Amphetamine Screen, Urine: NEGATIVE ng/mL
Barbiturates: NEGATIVE ng/mL
Benzodiazepines: NEGATIVE ng/mL
Creatinine, Urine: 77.6 mg/dL (ref 20.0–300.0)
Fentanyl, Urine: NEGATIVE pg/mL
Meperidine: NEGATIVE ng/mL
Opiate: NEGATIVE ng/mL
Propoxyphene: NEGATIVE ng/mL
Tapentadol, Urine: NEGATIVE ng/mL
Tramadol: NEGATIVE ng/mL
Zolpidem (Ambien), Urine: NEGATIVE ng/mL

## 2013-02-23 ENCOUNTER — Ambulatory Visit (INDEPENDENT_AMBULATORY_CARE_PROVIDER_SITE_OTHER): Payer: MEDICAID | Admitting: Psychiatry

## 2013-02-23 ENCOUNTER — Encounter (HOSPITAL_COMMUNITY): Payer: Self-pay | Admitting: Psychiatry

## 2013-02-23 VITALS — BP 110/80 | Ht 61.0 in | Wt 159.0 lb

## 2013-02-23 DIAGNOSIS — F329 Major depressive disorder, single episode, unspecified: Secondary | ICD-10-CM

## 2013-02-23 DIAGNOSIS — F332 Major depressive disorder, recurrent severe without psychotic features: Secondary | ICD-10-CM

## 2013-02-23 DIAGNOSIS — F988 Other specified behavioral and emotional disorders with onset usually occurring in childhood and adolescence: Secondary | ICD-10-CM

## 2013-02-23 DIAGNOSIS — F4310 Post-traumatic stress disorder, unspecified: Secondary | ICD-10-CM

## 2013-02-23 MED ORDER — METHYLPHENIDATE HCL 20 MG PO TABS: 20.0000 mg | ORAL_TABLET | Freq: Two times a day (BID) | ORAL | Status: DC

## 2013-02-23 MED ORDER — VENLAFAXINE HCL ER 150 MG PO CP24: 150.0000 mg | ORAL_CAPSULE | Freq: Two times a day (BID) | ORAL | Status: DC
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
## 2013-02-24 ENCOUNTER — Ambulatory Visit (INDEPENDENT_AMBULATORY_CARE_PROVIDER_SITE_OTHER): Payer: MEDICAID | Admitting: Psychiatry

## 2013-02-24 DIAGNOSIS — F339 Major depressive disorder, recurrent, unspecified: Secondary | ICD-10-CM
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
## 2013-02-24 NOTE — Patient Instructions (Signed)
Discussed orally 

## 2013-03-02 ENCOUNTER — Other Ambulatory Visit: Payer: Self-pay | Admitting: *Deleted

## 2013-03-02 DIAGNOSIS — K219 Gastro-esophageal reflux disease without esophagitis: Secondary | ICD-10-CM

## 2013-03-02 MED ORDER — OMEPRAZOLE 20 MG PO CPDR: 20.0000 mg | DELAYED_RELEASE_CAPSULE | Freq: Every day | ORAL | Status: DC

## 2013-03-24 ENCOUNTER — Ambulatory Visit (HOSPITAL_COMMUNITY): Payer: Self-pay | Admitting: Psychiatry

## 2013-04-07 ENCOUNTER — Ambulatory Visit (HOSPITAL_COMMUNITY): Payer: Self-pay | Admitting: Psychiatry

## 2013-04-14 ENCOUNTER — Ambulatory Visit (INDEPENDENT_AMBULATORY_CARE_PROVIDER_SITE_OTHER): Payer: Medicaid Other | Admitting: Family Medicine

## 2013-04-14 ENCOUNTER — Encounter (HOSPITAL_COMMUNITY): Payer: Self-pay | Admitting: Psychiatry

## 2013-04-14 ENCOUNTER — Encounter (INDEPENDENT_AMBULATORY_CARE_PROVIDER_SITE_OTHER): Payer: Self-pay

## 2013-04-14 ENCOUNTER — Ambulatory Visit (INDEPENDENT_AMBULATORY_CARE_PROVIDER_SITE_OTHER): Payer: MEDICAID | Admitting: Psychiatry

## 2013-04-14 ENCOUNTER — Encounter: Payer: Self-pay | Admitting: Family Medicine

## 2013-04-14 VITALS — BP 110/70 | Ht 61.0 in | Wt 159.0 lb

## 2013-04-14 VITALS — BP 103/70 | HR 84 | Temp 98.2°F | Ht 61.0 in | Wt 160.0 lb

## 2013-04-14 DIAGNOSIS — E785 Hyperlipidemia, unspecified: Secondary | ICD-10-CM

## 2013-04-14 DIAGNOSIS — R5381 Other malaise: Secondary | ICD-10-CM

## 2013-04-14 DIAGNOSIS — F339 Major depressive disorder, recurrent, unspecified: Secondary | ICD-10-CM

## 2013-04-14 DIAGNOSIS — Z23 Encounter for immunization: Secondary | ICD-10-CM

## 2013-04-14 DIAGNOSIS — R5383 Other fatigue: Secondary | ICD-10-CM

## 2013-04-14 DIAGNOSIS — E119 Type 2 diabetes mellitus without complications: Secondary | ICD-10-CM

## 2013-04-14 LAB — POCT CBC
Granulocyte percent: 60.1 %G (ref 37–80)
HCT, POC: 38.7 % (ref 37.7–47.9)
Hemoglobin: 12.9 g/dL (ref 12.2–16.2)
Lymph, poc: 1.7 (ref 0.6–3.4)
MCH, POC: 30.2 pg (ref 27–31.2)
MCHC: 33.2 g/dL (ref 31.8–35.4)
MCV: 90.8 fL (ref 80–97)
MPV: 9.5 fL (ref 0–99.8)
POC Granulocyte: 3.1 (ref 2–6.9)
POC LYMPH PERCENT: 34.3 %L (ref 10–50)
Platelet Count, POC: 176 10*3/uL (ref 142–424)
RBC: 4.3 M/uL (ref 4.04–5.48)
RDW, POC: 12.3 %
WBC: 5.1 10*3/uL (ref 4.6–10.2)

## 2013-04-14 LAB — POCT UA - MICROALBUMIN: Microalbumin Ur, POC: NEGATIVE mg/L

## 2013-04-14 LAB — POCT GLYCOSYLATED HEMOGLOBIN (HGB A1C): Hemoglobin A1C: 6.9

## 2013-04-14 MED ORDER — VENLAFAXINE HCL ER 150 MG PO CP24: 150.0000 mg | ORAL_CAPSULE | Freq: Every day | ORAL | Status: DC

## 2013-04-14 MED ORDER — METHYLPHENIDATE HCL 20 MG PO TABS: 20.0000 mg | ORAL_TABLET | Freq: Two times a day (BID) | ORAL | Status: DC

## 2013-04-14 MED ORDER — DULOXETINE HCL 60 MG PO CPEP: 60.0000 mg | ORAL_CAPSULE | Freq: Every day | ORAL | Status: DC

## 2013-04-14 NOTE — Progress Notes (Signed)
   Subjective:    Patient ID: Charlotte Perez, female    DOB: 16-Mar-1961, 53 y.o.   MRN: [id number]  HPI This 53 y.o. female presents for evaluation of diabetes, hypertension, depression, and Hyperlipidemia.  She is seeing psychiatry.  She is due for flu shot.  She  Is due for  UA for microalbumin.  She has no acute complaints.   Review of Systems No chest pain, SOB, HA, dizziness, vision change, N/V, diarrhea, constipation, dysuria, urinary urgency or frequency, myalgias, arthralgias or rash.     Objective:   Physical Exam Vital signs noted  Well developed well nourished female.  HEENT - Head atraumatic Normocephalic                Eyes - PERRLA, Conjuctiva - clear Sclera- Clear EOMI                Ears - EAC's Wnl TM's Wnl Gross Hearing WNL                Throat - oropharanx wnl Respiratory - Lungs CTA bilateral Cardiac - RRR S1 and S2 without murmur GI - Abdomen soft Nontender and bowel sounds active x 4 Extremities - No edema. Neuro - Grossly intact.       Assessment & Plan:  Diabetes - Plan: POCT UA - Microalbumin, POCT glycosylated hemoglobin (Hb A1C), CMP14+EGFR  Fatigue - Plan: POCT CBC, Vit D  25 hydroxy (rtn osteoporosis monitoring)  Hyperlipemia - Plan: Lipid panel  Follow up in 3 months  Lysbeth Penner FNP

## 2013-04-14 NOTE — Patient Instructions (Signed)
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
## 2013-04-15 LAB — CMP14+EGFR
ALT: 11 IU/L (ref 0–32)
AST: 12 IU/L (ref 0–40)
Albumin/Globulin Ratio: 1.7 (ref 1.1–2.5)
Albumin: 4.2 g/dL (ref 3.5–5.5)
Alkaline Phosphatase: 48 IU/L (ref 39–117)
BUN/Creatinine Ratio: 31 — ABNORMAL HIGH (ref 9–23)
BUN: 19 mg/dL (ref 6–24)
CO2: 25 mmol/L (ref 18–29)
Calcium: 9.2 mg/dL (ref 8.7–10.2)
Chloride: 98 mmol/L (ref 97–108)
Creatinine, Ser: 0.61 mg/dL (ref 0.57–1.00)
GFR calc Af Amer: 120 mL/min/{1.73_m2} (ref >59)
GFR calc non Af Amer: 104 mL/min/{1.73_m2} (ref >59)
Globulin, Total: 2.5 g/dL (ref 1.5–4.5)
Glucose: 127 mg/dL — ABNORMAL HIGH (ref 65–99)
Potassium: 4.8 mmol/L (ref 3.5–5.2)
Sodium: 139 mmol/L (ref 134–144)
Total Bilirubin: <0.2 mg/dL (ref 0.0–1.2)
Total Protein: 6.7 g/dL (ref 6.0–8.5)

## 2013-04-15 LAB — LIPID PANEL
Chol/HDL Ratio: 2.5 ratio units (ref 0.0–4.4)
Cholesterol, Total: 199 mg/dL (ref 100–199)
HDL: 79 mg/dL (ref >39)
LDL Calculated: 99 mg/dL (ref 0–99)
Triglycerides: 105 mg/dL (ref 0–149)
VLDL Cholesterol Cal: 21 mg/dL (ref 5–40)

## 2013-04-15 LAB — VITAMIN D 25 HYDROXY (VIT D DEFICIENCY, FRACTURES): Vit D, 25-Hydroxy: 31.7 ng/mL (ref 30.0–100.0)

## 2013-04-19 ENCOUNTER — Other Ambulatory Visit: Payer: Self-pay | Admitting: *Deleted

## 2013-04-19 DIAGNOSIS — I10 Essential (primary) hypertension: Secondary | ICD-10-CM

## 2013-04-19 DIAGNOSIS — E785 Hyperlipidemia, unspecified: Secondary | ICD-10-CM

## 2013-04-19 MED ORDER — SIMVASTATIN 40 MG PO TABS: 40.0000 mg | ORAL_TABLET | Freq: Every evening | ORAL | Status: DC

## 2013-04-19 MED ORDER — LISINOPRIL 10 MG PO TABS: 10.0000 mg | ORAL_TABLET | Freq: Every day | ORAL | Status: DC

## 2013-05-04 ENCOUNTER — Other Ambulatory Visit: Payer: Self-pay

## 2013-05-04 DIAGNOSIS — N951 Menopausal and female climacteric states: Secondary | ICD-10-CM

## 2013-05-04 MED ORDER — ESTRADIOL 0.5 MG PO TABS: 0.5000 mg | ORAL_TABLET | Freq: Two times a day (BID) | ORAL | Status: DC

## 2013-06-10 ENCOUNTER — Other Ambulatory Visit (HOSPITAL_COMMUNITY): Payer: Self-pay | Admitting: Psychiatry

## 2013-06-14 ENCOUNTER — Ambulatory Visit (HOSPITAL_COMMUNITY): Payer: Self-pay | Admitting: Psychiatry

## 2013-06-17 ENCOUNTER — Ambulatory Visit (INDEPENDENT_AMBULATORY_CARE_PROVIDER_SITE_OTHER): Payer: MEDICAID | Admitting: Psychiatry

## 2013-06-17 ENCOUNTER — Encounter (HOSPITAL_COMMUNITY): Payer: Self-pay | Admitting: Psychiatry

## 2013-06-17 VITALS — BP 130/80 | Ht 61.0 in | Wt 158.0 lb

## 2013-06-17 DIAGNOSIS — F332 Major depressive disorder, recurrent severe without psychotic features: Secondary | ICD-10-CM

## 2013-06-17 DIAGNOSIS — F4310 Post-traumatic stress disorder, unspecified: Secondary | ICD-10-CM

## 2013-06-17 DIAGNOSIS — F339 Major depressive disorder, recurrent, unspecified: Secondary | ICD-10-CM

## 2013-06-17 DIAGNOSIS — F988 Other specified behavioral and emotional disorders with onset usually occurring in childhood and adolescence: Secondary | ICD-10-CM

## 2013-06-17 MED ORDER — METHYLPHENIDATE HCL 20 MG PO TABS: 20.0000 mg | ORAL_TABLET | Freq: Every day | ORAL | Status: DC

## 2013-06-17 MED ORDER — METHYLPHENIDATE HCL 20 MG PO TABS: 20.0000 mg | ORAL_TABLET | Freq: Two times a day (BID) | ORAL | Status: DC

## 2013-06-17 MED ORDER — VENLAFAXINE HCL ER 150 MG PO CP24: 150.0000 mg | ORAL_CAPSULE | Freq: Every day | ORAL | Status: DC

## 2013-06-17 MED ORDER — DULOXETINE HCL 60 MG PO CPEP: 60.0000 mg | ORAL_CAPSULE | Freq: Every day | ORAL | Status: DC
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
## 2013-06-29 ENCOUNTER — Other Ambulatory Visit (HOSPITAL_COMMUNITY): Payer: Self-pay | Admitting: Psychiatry

## 2013-06-29 ENCOUNTER — Telehealth (HOSPITAL_COMMUNITY): Payer: Self-pay | Admitting: *Deleted

## 2013-06-29 MED ORDER — METHYLPHENIDATE HCL 20 MG PO TABS: 20.0000 mg | ORAL_TABLET | Freq: Two times a day (BID) | ORAL | Status: DC

## 2013-06-29 NOTE — Telephone Encounter (Signed)
done

## 2013-07-16 ENCOUNTER — Encounter: Payer: Self-pay | Admitting: General Practice

## 2013-07-16 ENCOUNTER — Ambulatory Visit (INDEPENDENT_AMBULATORY_CARE_PROVIDER_SITE_OTHER): Payer: Medicaid Other | Admitting: General Practice

## 2013-07-16 VITALS — BP 124/72 | HR 85 | Temp 97.1°F | Ht 59.5 in | Wt 159.8 lb

## 2013-07-16 DIAGNOSIS — Z01419 Encounter for gynecological examination (general) (routine) without abnormal findings: Secondary | ICD-10-CM

## 2013-07-16 DIAGNOSIS — Z0000 Encounter for general adult medical examination without abnormal findings: Secondary | ICD-10-CM

## 2013-07-16 LAB — POCT UA - MICROSCOPIC ONLY
Bacteria, U Microscopic: NEGATIVE
Casts, Ur, LPF, POC: NEGATIVE
Crystals, Ur, HPF, POC: NEGATIVE
Mucus, UA: NEGATIVE
RBC, URINE, MICROSCOPIC: NEGATIVE
WBC, UR, HPF, POC: NEGATIVE
YEAST UA: NEGATIVE

## 2013-07-16 LAB — POCT URINALYSIS DIPSTICK
Bilirubin, UA: NEGATIVE
Blood, UA: NEGATIVE
Glucose, UA: NEGATIVE
Ketones, UA: NEGATIVE
LEUKOCYTES UA: NEGATIVE
NITRITE UA: NEGATIVE
PROTEIN UA: NEGATIVE
SPEC GRAV UA: 1.01
UROBILINOGEN UA: NEGATIVE
pH, UA: 7

## 2013-07-16 NOTE — Progress Notes (Signed)
Subjective:    Patient ID: Charlotte Perez, female    DOB: 05-Sep-1960, 53 y.o.   MRN: [id number]  HPI Patient presents today for annual gyn exam. Denies complaints or concerns at this time. Reports self breast exam monthly.     Review of Systems  Constitutional: Negative for fever and chills.  Respiratory: Negative for chest tightness and shortness of breath.   Cardiovascular: Negative for chest pain and palpitations.  Gastrointestinal: Negative for nausea, vomiting, abdominal pain, diarrhea, constipation and blood in stool.  Genitourinary: Negative for dysuria, frequency, hematuria and vaginal bleeding.       Objective:   Physical Exam  Constitutional: She is oriented to person, place, and time. She appears well-developed and well-nourished.  HENT:  Head: Normocephalic and atraumatic.  Right Ear: External ear normal.  Left Ear: External ear normal.  Mouth/Throat: Oropharynx is clear and moist.  Eyes: Conjunctivae and EOM are normal. Pupils are equal, round, and reactive to light.  Neck: Normal range of motion. Neck supple. No thyromegaly present.  Cardiovascular: Normal rate, regular rhythm, normal heart sounds and intact distal pulses.   Pulmonary/Chest: Effort normal and breath sounds normal. No respiratory distress. She exhibits no tenderness. Right breast exhibits no inverted nipple, no mass, no nipple discharge, no skin change and no tenderness. Left breast exhibits no inverted nipple, no mass, no nipple discharge, no skin change and no tenderness. Breasts are symmetrical.  Abdominal: Soft. Bowel sounds are normal. She exhibits no distension.  Genitourinary: Vagina normal and uterus normal. No breast swelling, tenderness, discharge or bleeding. No labial fusion. There is no rash, tenderness, lesion or injury on the right labia. There is no rash, tenderness, lesion or injury on the left labia. No erythema, tenderness or bleeding around the vagina. No foreign body around the  vagina. No signs of injury around the vagina. No vaginal discharge found.  Lymphadenopathy:    She has no cervical adenopathy.  Neurological: She is alert and oriented to person, place, and time.  Skin: Skin is warm and dry.  Psychiatric: She has a normal mood and affect.      Results for orders placed in visit on 07/16/13  POCT URINALYSIS DIPSTICK      Result Value Ref Range   Color, UA yellow     Clarity, UA clear     Glucose, UA neg     Bilirubin, UA neg     Ketones, UA neg     Spec Grav, UA 1.010     Blood, UA neg     pH, UA 7.0     Protein, UA neg     Urobilinogen, UA negative     Nitrite, UA neg     Leukocytes, UA Negative    POCT UA - MICROSCOPIC ONLY      Result Value Ref Range   WBC, Ur, HPF, POC neg     RBC, urine, microscopic neg     Bacteria, U Microscopic neg     Mucus, UA neg     Epithelial cells, urine per micros occ     Crystals, Ur, HPF, POC neg     Casts, Ur, LPF, POC neg     Yeast, UA neg          Assessment & Plan:  1. Encounter for routine gynecological examination - POCT urinalysis dipstick - POCT UA - Microscopic Only - Pap IG w/ reflex to HPV when ASC-U -RTO prn -Patient verbalized understanding Erby Pian, FNP-C

## 2013-07-16 NOTE — Patient Instructions (Signed)

## 2013-07-20 LAB — PAP IG W/ RFLX HPV ASCU: PAP SMEAR COMMENT: 0

## 2013-08-09 ENCOUNTER — Other Ambulatory Visit: Payer: Self-pay | Admitting: Nurse Practitioner

## 2013-08-24 ENCOUNTER — Other Ambulatory Visit: Payer: Self-pay | Admitting: Family Medicine

## 2013-08-24 DIAGNOSIS — Z1231 Encounter for screening mammogram for malignant neoplasm of breast: Secondary | ICD-10-CM

## 2013-08-30 ENCOUNTER — Encounter (HOSPITAL_COMMUNITY): Payer: Self-pay | Admitting: Psychiatry

## 2013-08-30 ENCOUNTER — Telehealth (HOSPITAL_COMMUNITY): Payer: Self-pay | Admitting: *Deleted

## 2013-08-30 ENCOUNTER — Ambulatory Visit (INDEPENDENT_AMBULATORY_CARE_PROVIDER_SITE_OTHER): Payer: MEDICAID | Admitting: Psychiatry

## 2013-08-30 VITALS — BP 110/78 | Ht 61.0 in | Wt 160.0 lb

## 2013-08-30 DIAGNOSIS — F4310 Post-traumatic stress disorder, unspecified: Secondary | ICD-10-CM

## 2013-08-30 DIAGNOSIS — F339 Major depressive disorder, recurrent, unspecified: Secondary | ICD-10-CM

## 2013-08-30 DIAGNOSIS — F332 Major depressive disorder, recurrent severe without psychotic features: Secondary | ICD-10-CM

## 2013-08-30 DIAGNOSIS — F988 Other specified behavioral and emotional disorders with onset usually occurring in childhood and adolescence: Secondary | ICD-10-CM

## 2013-08-30 MED ORDER — METHYLPHENIDATE HCL 20 MG PO TABS: 20.0000 mg | ORAL_TABLET | Freq: Two times a day (BID) | ORAL | Status: DC

## 2013-08-30 MED ORDER — ALPRAZOLAM 1 MG PO TABS: 1.0000 mg | ORAL_TABLET | Freq: Every evening | ORAL | Status: DC | PRN

## 2013-08-30 MED ORDER — DULOXETINE HCL 60 MG PO CPEP: 60.0000 mg | ORAL_CAPSULE | Freq: Every day | ORAL | Status: DC

## 2013-08-30 MED ORDER — METHYLPHENIDATE HCL 20 MG PO TABS: 20.0000 mg | ORAL_TABLET | Freq: Every day | ORAL | Status: DC

## 2013-08-30 MED ORDER — VENLAFAXINE HCL ER 150 MG PO CP24
150.0000 mg | ORAL_CAPSULE | Freq: Every day | ORAL | Status: DC
Start: 2013-08-30 — End: 2013-11-26
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
## 2013-08-30 NOTE — Telephone Encounter (Signed)
Will need to come in.

## 2013-09-07 ENCOUNTER — Other Ambulatory Visit: Payer: Self-pay | Admitting: Family Medicine

## 2013-09-13 ENCOUNTER — Ambulatory Visit (HOSPITAL_COMMUNITY): Payer: Self-pay

## 2013-09-17 ENCOUNTER — Ambulatory Visit (HOSPITAL_COMMUNITY): Payer: Self-pay | Admitting: Psychiatry

## 2013-09-17 ENCOUNTER — Ambulatory Visit (HOSPITAL_COMMUNITY)
Admission: RE | Admit: 2013-09-17 | Discharge: 2013-09-17 | Disposition: A | Discharge status: Home / Self Care | Payer: Medicaid Other | Source: Ambulatory Visit | Attending: Family Medicine | Admitting: Family Medicine

## 2013-09-17 DIAGNOSIS — Z1231 Encounter for screening mammogram for malignant neoplasm of breast: Secondary | ICD-10-CM | POA: Insufficient documentation

## 2013-09-23 ENCOUNTER — Encounter: Payer: Self-pay | Admitting: Family

## 2013-09-23 ENCOUNTER — Ambulatory Visit (INDEPENDENT_AMBULATORY_CARE_PROVIDER_SITE_OTHER): Payer: Medicaid Other | Admitting: Family

## 2013-09-23 ENCOUNTER — Ambulatory Visit (INDEPENDENT_AMBULATORY_CARE_PROVIDER_SITE_OTHER): Payer: Medicaid Other

## 2013-09-23 VITALS — BP 105/68 | HR 82 | Temp 97.9°F | Ht 66.0 in | Wt 157.6 lb

## 2013-09-23 DIAGNOSIS — R1011 Right upper quadrant pain: Secondary | ICD-10-CM

## 2013-09-23 DIAGNOSIS — R109 Unspecified abdominal pain: Secondary | ICD-10-CM

## 2013-09-23 LAB — POCT CBC
Granulocyte percent: 65.4 %G (ref 37–80)
HCT, POC: 42.3 % (ref 37.7–47.9)
Hemoglobin: 13.6 g/dL (ref 12.2–16.2)
LYMPH, POC: 3.6 — AB (ref 0.6–3.4)
MCH, POC: 29.8 pg (ref 27–31.2)
MCHC: 32.1 g/dL (ref 31.8–35.4)
MCV: 92.7 fL (ref 80–97)
MPV: 7.5 fL (ref 0–99.8)
PLATELET COUNT, POC: 294 10*3/uL (ref 142–424)
POC Granulocyte: 7.4 — AB (ref 2–6.9)
POC LYMPH %: 31.8 % (ref 10–50)
RBC: 4.6 M/uL (ref 4.04–5.48)
RDW, POC: 13.9 %
WBC: 11.3 10*3/uL — AB (ref 4.6–10.2)

## 2013-09-23 LAB — POCT URINALYSIS DIPSTICK
Bilirubin, UA: NEGATIVE
Blood, UA: NEGATIVE
GLUCOSE UA: NEGATIVE
Ketones, UA: NEGATIVE
LEUKOCYTES UA: NEGATIVE
NITRITE UA: NEGATIVE
PH UA: 5
Spec Grav, UA: >=1.03
Urobilinogen, UA: NEGATIVE

## 2013-09-23 LAB — POCT UA - MICROSCOPIC ONLY
CASTS, UR, LPF, POC: NEGATIVE
Crystals, Ur, HPF, POC: NEGATIVE
Mucus, UA: NEGATIVE
Yeast, UA: NEGATIVE

## 2013-09-23 NOTE — Patient Instructions (Signed)
Cholecystitis Cholecystitis is an inflammation of your gallbladder. It is usually caused by a buildup of gallstones or sludge (cholelithiasis) in your gallbladder. The gallbladder stores a fluid that helps digest fats (bile). Cholecystitis is serious and needs treatment right away.  CAUSES   Gallstones. Gallstones can block the tube that leads to your gallbladder, causing bile to build up. As bile builds up, the gallbladder becomes inflamed.  Bile duct problems, such as blockage from scarring or kinking.  Tumors. Tumors can stop bile from leaving your gallbladder correctly, causing bile to build up. As bile builds up, the gallbladder becomes inflamed. SYMPTOMS   Nausea.  Vomiting.  Abdominal pain, especially in the upper right area of your abdomen.  Abdominal tenderness or bloating.  Sweating.  Chills.  Fever.  Yellowing of the skin and the whites of the eyes (jaundice). DIAGNOSIS  Your caregiver may order blood tests to look for infection or gallbladder problems. Your caregiver may also order imaging tests, such as an ultrasound or computed tomography (CT) scan. Further tests may include a hepatobiliary iminodiacetic acid (HIDA) scan. This scan allows your caregiver to see your bile move from the liver to the gallbladder and to the small intestine. TREATMENT  A hospital stay is usually necessary to lessen the inflammation of your gallbladder. You may be required to not eat or drink (fast) for a certain amount of time. You may be given medicine to treat pain or an antibiotic medicine to treat an infection. Surgery may be needed to remove your gallbladder (cholecystectomy) once the inflammation has gone down. Surgery may be needed right away if you develop complications such as death of gallbladder tissue (gangrene) or a tear (perforation) of the gallbladder.  Downing care will depend on your treatment. In general:  If you were given antibiotics, take them as  directed. Finish them even if you start to feel better.  Only take over-the-counter or prescription medicines for pain, discomfort, or fever as directed by your caregiver.  Follow a low-fat diet until you see your caregiver again.  Keep all follow-up visits as directed by your caregiver. SEEK IMMEDIATE MEDICAL CARE IF:   Your pain is increasing and not controlled by medicines.  Your pain moves to another part of your abdomen or to your back.  You have a fever.  You have nausea and vomiting. MAKE SURE YOU:  Understand these instructions.  Will watch your condition.  Will get help right away if you are not doing well or get worse. Document Released: 03/11/2005 Document Revised: 06/03/2011 Document Reviewed: 01/25/2011 [facility] Patient Information 2015 [place], [place]. This information is not intended to replace advice given to you by your health care provider. Make sure you discuss any questions you have with your health care provider.

## 2013-09-23 NOTE — Progress Notes (Signed)
   Subjective:    Patient ID: Charlotte Perez, female    DOB: 1960-12-21, 53 y.o.   MRN: [id number]  Abdominal Pain This is a new problem. The current episode started in the past 7 days (Four days). The onset quality is gradual. The problem occurs constantly. The problem has been gradually worsening. The pain is located in the RUQ. The pain is at a severity of 9/10. The pain is moderate. The quality of the pain is dull and aching. The abdominal pain radiates to the back and RLQ. Associated symptoms include diarrhea. Pertinent negatives include no constipation, fever, nausea or vomiting. The pain is aggravated by certain positions. The pain is relieved by sitting up. She has tried acetaminophen for the symptoms. The treatment provided mild relief.      Review of Systems  Constitutional: Negative for fever.  Gastrointestinal: Positive for abdominal pain and diarrhea. Negative for nausea, vomiting and constipation.       Objective:   Physical Exam  Vitals reviewed. Constitutional: She is oriented to person, place, and time. She appears well-developed and well-nourished. No distress.  Neck: Normal range of motion. Neck supple. No thyromegaly present.  Cardiovascular: Normal rate, regular rhythm, normal heart sounds and intact distal pulses.   No murmur heard. Pulmonary/Chest: Effort normal and breath sounds normal. No respiratory distress. She has no wheezes.  Abdominal: Soft. Bowel sounds are normal. She exhibits no distension. There is tenderness. There is guarding.  RUQ pain   Musculoskeletal: Normal range of motion. She exhibits no edema and no tenderness.  Neurological: She is alert and oriented to person, place, and time.  Skin: Skin is warm and dry.  Psychiatric: She has a normal mood and affect. Her behavior is normal. Judgment and thought content normal.   BP 105/68  Pulse 82  Temp(Src) 97.9 F (36.6 C) (Oral)  Ht 5\' 6"  (1.676 m)  Wt 157 lb 9.6 oz (71.487 kg)  BMI 25.45  kg/m2   Results for orders placed in visit on 09/23/13  POCT URINALYSIS DIPSTICK      Result Value Ref Range   Color, UA gold     Clarity, UA clear     Glucose, UA neg     Bilirubin, UA neg     Ketones, UA neg     Spec Grav, UA >=1.030     Blood, UA neg     pH, UA 5.0     Protein, UA 2+     Urobilinogen, UA negative     Nitrite, UA neg     Leukocytes, UA Negative    POCT UA - MICROSCOPIC ONLY      Result Value Ref Range   WBC, Ur, HPF, POC 1-5     RBC, urine, microscopic 1-3     Bacteria, U Microscopic few     Mucus, UA neg     Epithelial cells, urine per micros few     Crystals, Ur, HPF, POC neg     Casts, Ur, LPF, POC neg     Yeast, UA neg          Assessment & Plan:  1. Abdominal pain, other specified site - POCT urinalysis dipstick - POCT UA - Microscopic Only  2. RUQ abdominal pain -Do not eat or drink anything for ultrasound tomorrow at 9 - DG Abd 1 View; Future - US Abdomen Complete; Future - POCT CBC   Evelina Dun, FNP

## 2013-09-24 ENCOUNTER — Ambulatory Visit (HOSPITAL_COMMUNITY)
Admission: RE | Admit: 2013-09-24 | Discharge: 2013-09-24 | Disposition: A | Discharge status: Home / Self Care | Payer: Medicaid Other | Source: Ambulatory Visit | Attending: Family | Admitting: Family

## 2013-09-24 DIAGNOSIS — R1011 Right upper quadrant pain: Secondary | ICD-10-CM | POA: Diagnosis not present

## 2013-09-28 ENCOUNTER — Telehealth: Payer: Self-pay | Admitting: Family Medicine

## 2013-09-28 ENCOUNTER — Telehealth: Payer: Self-pay | Admitting: Family

## 2013-09-28 DIAGNOSIS — R1011 Right upper quadrant pain: Secondary | ICD-10-CM

## 2013-09-28 NOTE — Telephone Encounter (Signed)
Patient aware of ultrasound results

## 2013-09-28 NOTE — Telephone Encounter (Signed)
Message copied by Waverly Ferrari on Tue Sep 28, 2013 11:39 AM ------      Message from: Lenna Gilford, [place] A      Created: Mon Sep 27, 2013  9:50 AM       Ultrasound negative for inflammation of gallbladder ------

## 2013-09-29 ENCOUNTER — Telehealth: Payer: Self-pay | Admitting: Family

## 2013-09-29 ENCOUNTER — Other Ambulatory Visit: Payer: Self-pay | Admitting: Family Medicine

## 2013-09-29 NOTE — Telephone Encounter (Signed)
GI consult sent for RUQ pain

## 2013-09-29 NOTE — Telephone Encounter (Signed)
Patient aware.

## 2013-09-29 NOTE — Telephone Encounter (Signed)
In the first phone call

## 2013-10-05 ENCOUNTER — Encounter: Payer: Self-pay | Admitting: Gastroenterology

## 2013-10-06 ENCOUNTER — Other Ambulatory Visit: Payer: Self-pay | Admitting: Nurse Practitioner

## 2013-10-06 ENCOUNTER — Other Ambulatory Visit: Payer: Self-pay | Admitting: Family Medicine

## 2013-10-07 ENCOUNTER — Encounter: Payer: Self-pay | Admitting: Family

## 2013-10-25 ENCOUNTER — Ambulatory Visit (INDEPENDENT_AMBULATORY_CARE_PROVIDER_SITE_OTHER): Payer: Medicaid Other | Admitting: Family

## 2013-10-25 ENCOUNTER — Encounter: Payer: Self-pay | Admitting: Family

## 2013-10-25 VITALS — BP 113/73 | HR 81 | Temp 98.1°F | Ht 66.0 in | Wt 152.8 lb

## 2013-10-25 DIAGNOSIS — F329 Major depressive disorder, single episode, unspecified: Secondary | ICD-10-CM

## 2013-10-25 DIAGNOSIS — E785 Hyperlipidemia, unspecified: Secondary | ICD-10-CM

## 2013-10-25 DIAGNOSIS — Z1321 Encounter for screening for nutritional disorder: Secondary | ICD-10-CM

## 2013-10-25 DIAGNOSIS — R232 Flushing: Secondary | ICD-10-CM

## 2013-10-25 DIAGNOSIS — E1165 Type 2 diabetes mellitus with hyperglycemia: Secondary | ICD-10-CM

## 2013-10-25 DIAGNOSIS — L989 Disorder of the skin and subcutaneous tissue, unspecified: Secondary | ICD-10-CM

## 2013-10-25 DIAGNOSIS — F3289 Other specified depressive episodes: Secondary | ICD-10-CM

## 2013-10-25 DIAGNOSIS — F32A Depression, unspecified: Secondary | ICD-10-CM

## 2013-10-25 DIAGNOSIS — F411 Generalized anxiety disorder: Secondary | ICD-10-CM

## 2013-10-25 DIAGNOSIS — K219 Gastro-esophageal reflux disease without esophagitis: Secondary | ICD-10-CM

## 2013-10-25 DIAGNOSIS — N951 Menopausal and female climacteric states: Secondary | ICD-10-CM

## 2013-10-25 DIAGNOSIS — I10 Essential (primary) hypertension: Secondary | ICD-10-CM

## 2013-10-25 DIAGNOSIS — E119 Type 2 diabetes mellitus without complications: Secondary | ICD-10-CM

## 2013-10-25 DIAGNOSIS — M7730 Calcaneal spur, unspecified foot: Secondary | ICD-10-CM

## 2013-10-25 LAB — POCT CBC
Granulocyte percent: 61.1 %G (ref 37–80)
HEMATOCRIT: 39.6 % (ref 37.7–47.9)
Hemoglobin: 12.7 g/dL (ref 12.2–16.2)
LYMPH, POC: 1.6 (ref 0.6–3.4)
MCH, POC: 29.4 pg (ref 27–31.2)
MCHC: 32 g/dL (ref 31.8–35.4)
MCV: 91.9 fL (ref 80–97)
MPV: 9.7 fL (ref 0–99.8)
PLATELET COUNT, POC: 188 10*3/uL (ref 142–424)
POC GRANULOCYTE: 2.9 (ref 2–6.9)
POC LYMPH PERCENT: 35 %L (ref 10–50)
RBC: 4.3 M/uL (ref 4.04–5.48)
RDW, POC: 13.2 %
WBC: 4.7 10*3/uL (ref 4.6–10.2)

## 2013-10-25 LAB — POCT UA - MICROALBUMIN: MICROALBUMIN (UR) POC: NEGATIVE mg/L

## 2013-10-25 LAB — POCT GLYCOSYLATED HEMOGLOBIN (HGB A1C): Hemoglobin A1C: 6.4

## 2013-10-25 MED ORDER — SIMVASTATIN 20 MG PO TABS: ORAL_TABLET | ORAL | Status: DC

## 2013-10-25 MED ORDER — LISINOPRIL 10 MG PO TABS: ORAL_TABLET | ORAL | Status: DC

## 2013-10-25 MED ORDER — ESTRADIOL 0.5 MG PO TABS: ORAL_TABLET | ORAL | Status: DC

## 2013-10-25 MED ORDER — GLUCOSE BLOOD VI STRP: ORAL_STRIP | Status: DC

## 2013-10-25 MED ORDER — MELOXICAM 15 MG PO TABS: 15.0000 mg | ORAL_TABLET | Freq: Every day | ORAL | Status: DC

## 2013-10-25 NOTE — Patient Instructions (Signed)

## 2013-10-25 NOTE — Progress Notes (Signed)
Subjective:    Patient ID: Charlotte Perez, female    DOB: 25-Apr-1960, 53 y.o.   MRN: [id number]  Hypertension This is a chronic problem. The current episode started more than 1 year ago. The problem has been resolved since onset. The problem is controlled. Associated symptoms include anxiety and shortness of breath ("at times" pt has asthma). Pertinent negatives include no blurred vision, chest pain, headaches, palpitations or peripheral edema. Risk factors for coronary artery disease include diabetes mellitus, dyslipidemia, obesity and post-menopausal state. Past treatments include ACE inhibitors. The current treatment provides significant improvement. There is no history of kidney disease, CAD/MI, CVA, heart failure or a thyroid problem. There is no history of sleep apnea.  Diabetes She presents for her follow-up diabetic visit. She has type 2 diabetes mellitus. Her disease course has been fluctuating. Hypoglycemia symptoms include dizziness and nervousness/anxiousness. Pertinent negatives for hypoglycemia include no headaches. Pertinent negatives for diabetes include no blurred vision, no chest pain and no visual change. Pertinent negatives for diabetic complications include no CVA, heart disease or peripheral neuropathy. Risk factors for coronary artery disease include diabetes mellitus, dyslipidemia, family history, hypertension and obesity. Current diabetic treatment includes oral agent (dual therapy). She is compliant with treatment all of the time. She is following a generally healthy diet. Her breakfast blood glucose range is generally 110-130 mg/dl. An ACE inhibitor/angiotensin II receptor blocker is being taken. Eye exam is current.  Hyperlipidemia This is a chronic problem. The current episode started more than 1 year ago. The problem is uncontrolled. Recent lipid tests were reviewed and are high. Exacerbating diseases include diabetes. She has no history of hypothyroidism. Factors  aggravating her hyperlipidemia include fatty foods. Associated symptoms include shortness of breath ("at times" pt has asthma). Pertinent negatives include no chest pain, leg pain or myalgias. Current antihyperlipidemic treatment includes statins. The current treatment provides moderate improvement of lipids. Risk factors for coronary artery disease include diabetes mellitus, dyslipidemia, family history, hypertension and obesity.  Anxiety Presents for follow-up visit. Symptoms include depressed mood, dizziness, excessive worry, nervous/anxious behavior and shortness of breath ("at times" pt has asthma). Patient reports no chest pain, insomnia, irritability, nausea, palpitations or panic. Symptoms occur occasionally.   Her past medical history is significant for anxiety/panic attacks and depression. Past treatments include SSRIs and benzodiazephines. The treatment provided moderate relief. Compliance with prior treatments has been good.  Gastrophageal Reflux She complains of abdominal pain (Right side-Appointment  GI appt 09/17). She reports no chest pain, no coughing, no heartburn, no nausea or no sore throat. This is a chronic problem. The current episode started more than 1 year ago. The problem occurs rarely. The problem has been resolved. The symptoms are aggravated by certain foods. She has tried a PPI for the symptoms. The treatment provided significant relief.    *Pt gets anxiety and depression medications from behavioral health. Pt c/o of RUQ pain and was seen in office for this 07/02. Pt has GI appointment on 09/17.  Review of Systems  Constitutional: Negative.  Negative for irritability.  HENT: Negative.  Negative for sore throat.   Eyes: Negative.  Negative for blurred vision.  Respiratory: Positive for shortness of breath ("at times" pt has asthma). Negative for cough.   Cardiovascular: Negative.  Negative for chest pain and palpitations.  Gastrointestinal: Positive for abdominal pain  (Right side-Appointment  GI appt 09/17). Negative for heartburn and nausea.  Endocrine: Negative.   Genitourinary: Negative.   Musculoskeletal: Negative.  Negative for myalgias.  Neurological: Positive for dizziness. Negative for headaches.  Hematological: Negative.   Psychiatric/Behavioral: The patient is nervous/anxious. The patient does not have insomnia.   All other systems reviewed and are negative.      Objective:   Physical Exam  Vitals reviewed. Constitutional: She is oriented to person, place, and time. She appears well-developed and well-nourished. No distress.  HENT:  Head: Normocephalic and atraumatic.  Right Ear: External ear normal.  Left Ear: External ear normal.  Nose: Nose normal.  Mouth/Throat: Oropharynx is clear and moist.  Eyes: Pupils are equal, round, and reactive to light.  Neck: Normal range of motion. Neck supple. No thyromegaly present.  Cardiovascular: Normal rate, regular rhythm, normal heart sounds and intact distal pulses.   No murmur heard. Pulmonary/Chest: Effort normal and breath sounds normal. No respiratory distress. She has no wheezes.  Abdominal: Soft. Bowel sounds are normal. She exhibits no distension. There is tenderness (mild in RUQ).  Musculoskeletal: Normal range of motion. She exhibits no edema and no tenderness.  Neurological: She is alert and oriented to person, place, and time. She has normal reflexes. No cranial nerve deficit.  Skin: Skin is warm and dry.  Skin colored lesion on back but middle is black  Psychiatric: She has a normal mood and affect. Her behavior is normal. Judgment and thought content normal.      BP 113/73  Pulse 81  Temp(Src) 98.1 F (36.7 C) (Oral)  Ht _0  (1.676 m)  Wt 152 lb 12.8 oz (69.31 kg)  BMI 24.67 kg/m2     Assessment & Plan:  1. Essential hypertension - CMP14+EGFR - lisinopril (PRINIVIL,ZESTRIL) 10 MG tablet; TAKE ONE (1) TABLET EACH DAY  Dispense: 90 tablet; Refill: 3  2. Type 2  diabetes mellitus with hyperglycemia - POCT glycosylated hemoglobin (Hb A1C) - POCT UA - Microalbumin - POCT CBC - CMP14+EGFR - glucose blood (ONE TOUCH ULTRA TEST) test strip; Test 1X per and prn-- dx 250.02  Dispense: 100 each; Refill: 12  3. Depression - CMP14+EGFR  4. Hyperlipidemia - CMP14+EGFR - Lipid panel - simvastatin (ZOCOR) 20 MG tablet; TAKE ONE TABLET DAILY AT BEDTIME  Dispense: 90 tablet; Refill: 2  5. GAD (generalized anxiety disorder) - CMP14+EGFR  6. Gastroesophageal reflux disease without esophagitis  - CMP14+EGFR  7. Encounter for vitamin deficiency screening - Vit D  25 hydroxy (rtn osteoporosis monitoring)  8. Hot flashes - estradiol (ESTRACE) 0.5 MG tablet; TAKE ONE TABLET BY MOUTH TWICE DAILY  Dispense: 180 tablet; Refill: 3  9. Heel spur, unspecified laterality - meloxicam (MOBIC) 15 MG tablet; Take 1 tablet (15 mg total) by mouth at bedtime.  Dispense: 90 tablet; Refill: 3   10. Encounter for removal of skin lesion - Ambulatory referral to Dermatology   Continue all meds Labs pending Health Maintenance reviewed Diet and exercise encouraged RTO 3 months  Evelina Dun, FNP

## 2013-10-26 LAB — VITAMIN D 25 HYDROXY (VIT D DEFICIENCY, FRACTURES): Vit D, 25-Hydroxy: 34.5 ng/mL (ref 30.0–100.0)

## 2013-10-26 LAB — CMP14+EGFR
ALK PHOS: 52 IU/L (ref 39–117)
ALT: 9 IU/L (ref 0–32)
AST: 11 IU/L (ref 0–40)
Albumin/Globulin Ratio: 1.7 (ref 1.1–2.5)
Albumin: 4 g/dL (ref 3.5–5.5)
BUN / CREAT RATIO: 17 (ref 9–23)
BUN: 12 mg/dL (ref 6–24)
CHLORIDE: 101 mmol/L (ref 97–108)
CO2: 25 mmol/L (ref 18–29)
Calcium: 9.8 mg/dL (ref 8.7–10.2)
Creatinine, Ser: 0.69 mg/dL (ref 0.57–1.00)
GFR calc non Af Amer: 100 mL/min/{1.73_m2} (ref >59)
GFR, EST AFRICAN AMERICAN: 115 mL/min/{1.73_m2} (ref >59)
Globulin, Total: 2.3 g/dL (ref 1.5–4.5)
Glucose: 138 mg/dL — ABNORMAL HIGH (ref 65–99)
Potassium: 5.3 mmol/L — ABNORMAL HIGH (ref 3.5–5.2)
Sodium: 140 mmol/L (ref 134–144)
Total Protein: 6.3 g/dL (ref 6.0–8.5)

## 2013-10-26 LAB — LIPID PANEL
CHOLESTEROL TOTAL: 154 mg/dL (ref 100–199)
Chol/HDL Ratio: 1.9 ratio units (ref 0.0–4.4)
HDL: 79 mg/dL (ref >39)
LDL Calculated: 54 mg/dL (ref 0–99)
Triglycerides: 106 mg/dL (ref 0–149)
VLDL Cholesterol Cal: 21 mg/dL (ref 5–40)

## 2013-10-29 ENCOUNTER — Other Ambulatory Visit (HOSPITAL_COMMUNITY): Payer: Self-pay | Admitting: Psychiatry

## 2013-11-03 ENCOUNTER — Encounter: Payer: Self-pay | Admitting: Family

## 2013-11-03 ENCOUNTER — Ambulatory Visit (INDEPENDENT_AMBULATORY_CARE_PROVIDER_SITE_OTHER): Payer: Medicaid Other | Admitting: Family

## 2013-11-03 VITALS — BP 101/65 | HR 96 | Temp 97.4°F | Ht 66.0 in | Wt 153.6 lb

## 2013-11-03 DIAGNOSIS — E1165 Type 2 diabetes mellitus with hyperglycemia: Secondary | ICD-10-CM

## 2013-11-03 DIAGNOSIS — E119 Type 2 diabetes mellitus without complications: Secondary | ICD-10-CM

## 2013-11-03 NOTE — Progress Notes (Signed)
   Subjective:    Patient ID: Charlotte Perez, female    DOB: 1960/10/14, 53 y.o.   MRN: [id number]  Diabetes Pertinent negatives for hypoglycemia include no headaches.   Pt presents to the office for a Diabetes follow-up and rx for diabetic shoes. Pt  States she has "numbness and tingling in her feet if she stands up too long".    Review of Systems  Constitutional: Negative.   HENT: Negative.   Eyes: Negative.   Respiratory: Negative.  Negative for shortness of breath.   Cardiovascular: Negative.  Negative for palpitations.  Gastrointestinal: Negative.   Endocrine: Negative.   Genitourinary: Negative.   Musculoskeletal: Negative.   Neurological: Negative.  Negative for headaches.  Hematological: Negative.   Psychiatric/Behavioral: Negative.   All other systems reviewed and are negative.      Objective:   Physical Exam  Vitals reviewed. Constitutional: She is oriented to person, place, and time. She appears well-developed and well-nourished. No distress.  HENT:  Head: Normocephalic and atraumatic.  Right Ear: External ear normal.  Mouth/Throat: Oropharynx is clear and moist.  Eyes: Pupils are equal, round, and reactive to light.  Cardiovascular: Normal rate, regular rhythm, normal heart sounds and intact distal pulses.   No murmur heard. Pulmonary/Chest: Effort normal and breath sounds normal. No respiratory distress. She has no wheezes.  Abdominal: Soft. Bowel sounds are normal. She exhibits no distension. There is no tenderness.  Musculoskeletal: Normal range of motion. She exhibits no edema and no tenderness.  Neurological: She is alert and oriented to person, place, and time. She has normal reflexes. No cranial nerve deficit.  Skin: Skin is warm and dry.  Psychiatric: She has a normal mood and affect. Her behavior is normal. Judgment and thought content normal.   BP 101/65  Pulse 96  Temp(Src) 97.4 F (36.3 C) (Oral)  Ht 5\' 6"  (1.676 m)  Wt 153 lb 9.6 oz (69.673  kg)  BMI 24.80 kg/m2  See Diabetic foot note       Assessment & Plan:  1. Type 2 diabetes mellitus with hyperglycemia -Pt to examine feet daily for any s/s of infection -maintain control of blood sugars_Encourage low carb diet and exercise - PR DIABETIC DELUXE SHOE -RTO prn and keep chronic follow-up appointments  Evelina Dun, FNP

## 2013-11-03 NOTE — Patient Instructions (Signed)
Diabetes and Foot Care Diabetes may cause you to have problems because of poor blood supply (circulation) to your feet and legs. This may cause the skin on your feet to become thinner, break easier, and heal more slowly. Your skin may become dry, and the skin may peel and crack. You may also have nerve damage in your legs and feet causing decreased feeling in them. You may not notice minor injuries to your feet that could lead to infections or more serious problems. Taking care of your feet is one of the most important things you can do for yourself.  HOME CARE INSTRUCTIONS  Wear shoes at all times, even in the house. Do not go barefoot. Bare feet are easily injured.  Check your feet daily for blisters, cuts, and redness. If you cannot see the bottom of your feet, use a mirror or ask someone for help.  Wash your feet with warm water (do not use hot water) and mild soap. Then pat your feet and the areas between your toes until they are completely dry. Do not soak your feet as this can dry your skin.  Apply a moisturizing lotion or petroleum jelly (that does not contain alcohol and is unscented) to the skin on your feet and to dry, brittle toenails. Do not apply lotion between your toes.  Trim your toenails straight across. Do not dig under them or around the cuticle. File the edges of your nails with an emery board or nail file.  Do not cut corns or calluses or try to remove them with medicine.  Wear clean socks or stockings every day. Make sure they are not too tight. Do not wear knee-high stockings since they may decrease blood flow to your legs.  Wear shoes that fit properly and have enough cushioning. To break in new shoes, wear them for just a few hours a day. This prevents you from injuring your feet. Always look in your shoes before you put them on to be sure there are no objects inside.  Do not cross your legs. This may decrease the blood flow to your feet.  If you find a minor scrape,  cut, or break in the skin on your feet, keep it and the skin around it clean and dry. These areas may be cleansed with mild soap and water. Do not cleanse the area with peroxide, alcohol, or iodine.  When you remove an adhesive bandage, be sure not to damage the skin around it.  If you have a wound, look at it several times a day to make sure it is healing.  Do not use heating pads or hot water bottles. They may burn your skin. If you have lost feeling in your feet or legs, you may not know it is happening until it is too late.  Make sure your health care provider performs a complete foot exam at least annually or more often if you have foot problems. Report any cuts, sores, or bruises to your health care provider immediately. SEEK MEDICAL CARE IF:   You have an injury that is not healing.  You have cuts or breaks in the skin.  You have an ingrown nail.  You notice redness on your legs or feet.  You feel burning or tingling in your legs or feet.  You have pain or cramps in your legs and feet.  Your legs or feet are numb.  Your feet always feel cold. SEEK IMMEDIATE MEDICAL CARE IF:   There is increasing redness,   swelling, or pain in or around a wound.  There is a red line that goes up your leg.  Pus is coming from a wound.  You develop a fever or as directed by your health care provider.  You notice a bad smell coming from an ulcer or wound. Document Released: 03/08/2000 Document Revised: 11/11/2012 Document Reviewed: 08/18/2012 [employer] Patient Information 2015 [employer]. This information is not intended to replace advice given to you by your health care provider. Make sure you discuss any questions you have with your health care provider.  

## 2013-11-21 ENCOUNTER — Emergency Department (HOSPITAL_COMMUNITY)
Admission: EM | Admit: 2013-11-21 | Discharge: 2013-11-21 | Disposition: A | Discharge status: Home / Self Care | Payer: Medicaid Other | Attending: Emergency Medicine | Admitting: Emergency Medicine

## 2013-11-21 ENCOUNTER — Encounter (HOSPITAL_COMMUNITY): Payer: Self-pay | Admitting: Emergency Medicine

## 2013-11-21 DIAGNOSIS — F319 Bipolar disorder, unspecified: Secondary | ICD-10-CM | POA: Insufficient documentation

## 2013-11-21 DIAGNOSIS — E119 Type 2 diabetes mellitus without complications: Secondary | ICD-10-CM | POA: Diagnosis not present

## 2013-11-21 DIAGNOSIS — Z8619 Personal history of other infectious and parasitic diseases: Secondary | ICD-10-CM | POA: Insufficient documentation

## 2013-11-21 DIAGNOSIS — K219 Gastro-esophageal reflux disease without esophagitis: Secondary | ICD-10-CM | POA: Diagnosis not present

## 2013-11-21 DIAGNOSIS — Z79899 Other long term (current) drug therapy: Secondary | ICD-10-CM | POA: Insufficient documentation

## 2013-11-21 DIAGNOSIS — I10 Essential (primary) hypertension: Secondary | ICD-10-CM | POA: Insufficient documentation

## 2013-11-21 DIAGNOSIS — IMO0002 Reserved for concepts with insufficient information to code with codable children: Secondary | ICD-10-CM | POA: Diagnosis not present

## 2013-11-21 DIAGNOSIS — M5416 Radiculopathy, lumbar region: Secondary | ICD-10-CM

## 2013-11-21 DIAGNOSIS — R109 Unspecified abdominal pain: Secondary | ICD-10-CM | POA: Insufficient documentation

## 2013-11-21 DIAGNOSIS — E785 Hyperlipidemia, unspecified: Secondary | ICD-10-CM | POA: Insufficient documentation

## 2013-11-21 DIAGNOSIS — J449 Chronic obstructive pulmonary disease, unspecified: Secondary | ICD-10-CM | POA: Insufficient documentation

## 2013-11-21 DIAGNOSIS — J4489 Other specified chronic obstructive pulmonary disease: Secondary | ICD-10-CM | POA: Insufficient documentation

## 2013-11-21 LAB — URINALYSIS, ROUTINE W REFLEX MICROSCOPIC
Bilirubin Urine: NEGATIVE
GLUCOSE, UA: NEGATIVE mg/dL
Hgb urine dipstick: NEGATIVE
Leukocytes, UA: NEGATIVE
Nitrite: NEGATIVE
PH: 6 (ref 5.0–8.0)
PROTEIN: NEGATIVE mg/dL
Specific Gravity, Urine: >1.03 — ABNORMAL HIGH (ref 1.005–1.030)
Urobilinogen, UA: 1 mg/dL (ref 0.0–1.0)

## 2013-11-21 MED ORDER — MELOXICAM 15 MG PO TABS: 15.0000 mg | ORAL_TABLET | Freq: Every day | ORAL | Status: DC

## 2013-11-21 MED ORDER — TRAMADOL HCL 50 MG PO TABS: ORAL_TABLET | ORAL | Status: DC

## 2013-11-21 MED ORDER — KETOROLAC TROMETHAMINE 10 MG PO TABS
10.0000 mg | ORAL_TABLET | Freq: Once | ORAL | Status: AC
  Administered 2013-11-21: 10 mg via ORAL
  Filled 2013-11-21: qty 1

## 2013-11-21 MED ORDER — TRAMADOL HCL 50 MG PO TABS
100.0000 mg | ORAL_TABLET | Freq: Once | ORAL | Status: AC
Start: 2013-11-21 — End: 2013-11-21
  Administered 2013-11-21: 100 mg via ORAL
  Filled 2013-11-21: qty 2

## 2013-11-21 MED ORDER — ONDANSETRON HCL 4 MG PO TABS
4.0000 mg | ORAL_TABLET | Freq: Once | ORAL | Status: AC
  Administered 2013-11-21: 4 mg via ORAL
  Filled 2013-11-21: qty 1

## 2013-11-21 NOTE — ED Provider Notes (Signed)
Medical screening examination/treatment/procedure(s) were performed by non-physician practitioner and as supervising physician I was immediately available for consultation/collaboration.  BP 132/74  Pulse 87  Temp(Src) 97.7 F (36.5 C) (Oral)  Resp 16  Ht 5\' 1"  (1.549 m)  Wt 153 lb (69.4 kg)  BMI 28.92 kg/m2  SpO2 100%   EKG Interpretation None       Ezequiel Essex, MD 11/21/13 1715

## 2013-11-21 NOTE — ED Provider Notes (Signed)
CSN: [id number]     Arrival date & time 11/21/13  0805 History   First MD Initiated Contact with Patient 11/21/13 [phone]     Chief Complaint  Patient presents with  . Flank Pain     (Consider location/radiation/quality/duration/timing/severity/associated sxs/prior Treatment) HPI Comments: Patient complains of right flank, back, and lower abdomen area pain. She has had similar episodes related to her back in the past, but states that this one is worse. The patient is scheduled to see a pain management specialist as well as a gastroenterologist within the next one to 2 weeks, but states she could not take the pain until the time of the appointment, but came to the emergency department to have this checked to make sure she did not have anything bad going on.  Patient is a 53 y.o. female presenting with flank pain. The history is provided by the patient.  Flank Pain The current episode started more than 1 month ago. The problem occurs intermittently. Associated symptoms include arthralgias. Pertinent negatives include no abdominal pain, fever, nausea, neck pain, numbness or weakness. The symptoms are aggravated by standing and walking. She has tried acetaminophen for the symptoms. The treatment provided no relief.    Past Medical History  Diagnosis Date  . Bipolar disorder   . Asthma   . DDD (degenerative disc disease), cervical   . DDD (degenerative disc disease), lumbar   . Hypertension   . Shingles   . Hyperlipidemia   . GERD (gastroesophageal reflux disease)   . Skin lesions, generalized     1.2 CM FLAT FAWN COLOR AT THE T10 AREA JUST RIGHT OF SPINE   . Neuropathy of foot     Feet   . Mole (skin)     pt. thinks its a tick   . Diabetes mellitus without complication   . COPD (chronic obstructive pulmonary disease)   . Depression   . Diabetes mellitus, type II    Past Surgical History  Procedure Laterality Date  . Cesarean section    . Back surgery    . Abdominal hysterectomy     . Neck surgery     Family History  Problem Relation Age of Onset  . COPD Father   . Alcohol abuse Father   . Depression Daughter   . Depression Daughter   . ADD / ADHD Other   . Alcohol abuse Mother    History  Substance Use Topics  . Smoking status: Passive Smoke Exposure - Never Smoker  . Smokeless tobacco: Never Used  . Alcohol Use: No   OB History   Grav Para Term Preterm Abortions TAB SAB Ect Mult Living                 Review of Systems  Constitutional: Negative for fever and activity change.       All ROS Neg except as noted in HPI  HENT: Negative.   Eyes: Negative for photophobia and discharge.  Respiratory: Negative for shortness of breath and wheezing.   Cardiovascular: Negative for palpitations.  Gastrointestinal: Negative for nausea, abdominal pain and blood in stool.  Genitourinary: Positive for flank pain. Negative for dysuria, frequency and hematuria.  Musculoskeletal: Positive for arthralgias and back pain. Negative for neck pain.  Skin: Negative.   Neurological: Negative for dizziness, seizures, speech difficulty, weakness and numbness.  Psychiatric/Behavioral: Negative for hallucinations and confusion.       Depression      Allergies  Gabapentin  Home Medications   Prior  to Admission medications   Medication Sig Start Date End Date Taking? Authorizing Provider  ALPRAZolam Duanne Moron) 1 MG tablet Take 1 tablet (1 mg total) by mouth at bedtime as needed for anxiety. 08/30/13 08/30/14  [name], MD  DULoxetine (CYMBALTA) 60 MG capsule Take 1 capsule (60 mg total) by mouth daily. 08/30/13 08/30/14  [name], MD  estradiol (ESTRACE) 0.5 MG tablet TAKE ONE TABLET BY MOUTH TWICE DAILY 10/25/13   Sharion Balloon, FNP  glucose blood (ONE TOUCH ULTRA TEST) test strip Test 1X per and prn-- dx 250.02 10/25/13   Sharion Balloon, FNP  lisinopril (PRINIVIL,ZESTRIL) 10 MG tablet TAKE ONE (1) TABLET EACH DAY 10/25/13   Sharion Balloon, FNP  meloxicam (MOBIC) 15 MG tablet  Take 1 tablet (15 mg total) by mouth at bedtime. 10/25/13   Sharion Balloon, FNP  metFORMIN (GLUCOPHAGE) 1000 MG tablet Take 1 tablet (1,000 mg total) by mouth 2 (two) times daily with a meal. 01/11/13   Lysbeth Penner, FNP  methylphenidate (RITALIN) 20 MG tablet Take 1 tablet (20 mg total) by mouth 2 (two) times daily with breakfast and lunch. 08/30/13 08/30/14  [name], MD  omeprazole (PRILOSEC) 20 MG capsule TAKE ONE (1) CAPSULE EACH DAY    Mae Loree Fee, FNP  [facility] DELICA LANCETS FINE MISC 1 each by Does not apply route daily. Patient test 1X per day and prn-- dx-250.02 10/07/12   Mary-Margaret Hassell Done, FNP  pioglitazone (ACTOS) 15 MG tablet Take 1 tablet (15 mg total) by mouth daily. 01/11/13   Lysbeth Penner, FNP  simvastatin (ZOCOR) 20 MG tablet TAKE ONE TABLET DAILY AT BEDTIME 10/25/13   Sharion Balloon, FNP  venlafaxine XR (EFFEXOR-XR) 150 MG 24 hr capsule Take 1 capsule (150 mg total) by mouth daily with breakfast. 08/30/13 08/30/14  [name], MD   There were no vitals taken for this visit. Physical Exam  Nursing note and vitals reviewed. Constitutional: She is oriented to person, place, and time. She appears well-developed and well-nourished.  Non-toxic appearance.  HENT:  Head: Normocephalic.  Right Ear: Tympanic membrane and external ear normal.  Left Ear: Tympanic membrane and external ear normal.  Eyes: EOM and lids are normal. Pupils are equal, round, and reactive to light.  Neck: Normal range of motion. Neck supple. Carotid bruit is not present.  Cardiovascular: Normal rate, regular rhythm, normal heart sounds, intact distal pulses and normal pulses.   Pulmonary/Chest: Breath sounds normal. No respiratory distress.  Abdominal: Soft. Bowel sounds are normal. There is no tenderness. There is no guarding.  The abdomen is soft with good bowel sounds, there is no mass appreciated. There is no tenderness at McBurney's point.  Musculoskeletal: Normal range of motion.  There is  pain to range of motion of the lumbar spine area. There is pain to palpation and range of motion of the paraspinal area of the lumbar region. There is soreness just above the right hip and extending into the right flank area. No hot areas appreciated.  Lymphadenopathy:       Head (right side): No submandibular adenopathy present.       Head (left side): No submandibular adenopathy present.    She has no cervical adenopathy.  Neurological: She is alert and oriented to person, place, and time. She has normal strength. No cranial nerve deficit or sensory deficit.  Skin: Skin is warm and dry.  Psychiatric: She has a normal mood and affect. Her speech is normal.  ED Course  Procedures (including critical care time) Labs Review Labs Reviewed - No data to display  Imaging Review No results found.   EKG Interpretation None      MDM Differential diagnosis includes urinary tract infection, renal calculi, lumbar radiculopathy related to degenerative disc disease, muscle strain, right lower quadrant GI pain.  Vital signs are well within normal limits. Pulse oximetry 100% on room air. Patient is ambulatory in the room without problem. There's no pain of the lower abdomen with flexing of the psoas. There is no tenderness to palpation. There is tenderness and pain with range of motion of the back and right hip area.  Urine analysis is negative for acute infection, or signs of kidney stone.  I have reviewed the patient's previous records and there is documented evidence of degenerative disc disease involving the lumbar area.  Suspect the patient is having an exacerbation of lumbar radiculopathy. Patient will be treated with Mobic 15 mg, and Ultram every 6 hours. Patient is to followup with her specialist as has been recommended by her primary physician.    Final diagnoses:  None    *I have reviewed nursing notes, vital signs, and all appropriate lab and imaging results for this  patient.Lenox Ahr, PA-C 11/21/13 1015

## 2013-11-21 NOTE — Discharge Instructions (Signed)
Please see your GI specialist and pain management specialist as suggested. Please use warm tub soaks to your back and hip area daily. Use Mobic daily with food. Use Ultram every 6 hours as needed for pain. This medication may cause drowsiness, please use with caution.

## 2013-11-21 NOTE — ED Notes (Signed)
Pt c/o RT sided flank pain that radiates to lower back and abdomen. Pt states that this pain has been going on for over a month. Pt states she has an MD appt with gastroenterologist on 11/07/13. Pt took Tylenol for pain at 8pm last night.

## 2013-11-26 ENCOUNTER — Ambulatory Visit (INDEPENDENT_AMBULATORY_CARE_PROVIDER_SITE_OTHER): Payer: MEDICAID | Admitting: Psychiatry

## 2013-11-26 ENCOUNTER — Other Ambulatory Visit: Payer: Self-pay | Admitting: Family Medicine

## 2013-11-26 ENCOUNTER — Encounter (HOSPITAL_COMMUNITY): Payer: Self-pay | Admitting: Psychiatry

## 2013-11-26 VITALS — BP 102/59 | HR 81 | Ht 61.0 in | Wt 153.4 lb

## 2013-11-26 DIAGNOSIS — F4310 Post-traumatic stress disorder, unspecified: Secondary | ICD-10-CM

## 2013-11-26 DIAGNOSIS — F332 Major depressive disorder, recurrent severe without psychotic features: Secondary | ICD-10-CM

## 2013-11-26 DIAGNOSIS — F339 Major depressive disorder, recurrent, unspecified: Secondary | ICD-10-CM

## 2013-11-26 DIAGNOSIS — F988 Other specified behavioral and emotional disorders with onset usually occurring in childhood and adolescence: Secondary | ICD-10-CM

## 2013-11-26 MED ORDER — DULOXETINE HCL 60 MG PO CPEP: 60.0000 mg | ORAL_CAPSULE | Freq: Every day | ORAL | Status: DC

## 2013-11-26 MED ORDER — METHYLPHENIDATE HCL 20 MG PO TABS: 20.0000 mg | ORAL_TABLET | Freq: Two times a day (BID) | ORAL | Status: DC

## 2013-11-26 MED ORDER — ALPRAZOLAM 1 MG PO TABS: 1.0000 mg | ORAL_TABLET | Freq: Every evening | ORAL | Status: DC | PRN

## 2013-11-26 MED ORDER — VENLAFAXINE HCL ER 150 MG PO CP24: ORAL_CAPSULE | ORAL | Status: DC
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
## 2013-11-30 ENCOUNTER — Ambulatory Visit (HOSPITAL_COMMUNITY): Payer: Self-pay | Admitting: Psychiatry

## 2013-12-09 ENCOUNTER — Ambulatory Visit: Payer: Self-pay | Admitting: Gastroenterology

## 2013-12-22 ENCOUNTER — Ambulatory Visit: Payer: Medicaid Other | Admitting: Family Medicine

## 2013-12-23 ENCOUNTER — Ambulatory Visit: Payer: Medicaid Other | Admitting: Family Medicine

## 2013-12-26 ENCOUNTER — Other Ambulatory Visit: Payer: Self-pay | Admitting: General Practice

## 2014-01-03 ENCOUNTER — Ambulatory Visit (INDEPENDENT_AMBULATORY_CARE_PROVIDER_SITE_OTHER): Payer: Medicaid Other

## 2014-01-03 DIAGNOSIS — Z23 Encounter for immunization: Secondary | ICD-10-CM

## 2014-01-06 ENCOUNTER — Telehealth: Payer: Self-pay | Admitting: Family

## 2014-01-24 ENCOUNTER — Other Ambulatory Visit (HOSPITAL_COMMUNITY): Payer: Self-pay | Admitting: Psychiatry

## 2014-01-26 ENCOUNTER — Encounter (HOSPITAL_COMMUNITY): Payer: Self-pay | Admitting: Psychiatry

## 2014-01-26 ENCOUNTER — Ambulatory Visit (INDEPENDENT_AMBULATORY_CARE_PROVIDER_SITE_OTHER): Payer: MEDICAID | Admitting: Psychiatry

## 2014-01-26 VITALS — BP 111/76 | HR 88 | Ht 61.0 in | Wt 150.6 lb

## 2014-01-26 DIAGNOSIS — F988 Other specified behavioral and emotional disorders with onset usually occurring in childhood and adolescence: Secondary | ICD-10-CM

## 2014-01-26 DIAGNOSIS — F32A Depression, unspecified: Secondary | ICD-10-CM

## 2014-01-26 DIAGNOSIS — F4310 Post-traumatic stress disorder, unspecified: Secondary | ICD-10-CM

## 2014-01-26 DIAGNOSIS — F900 Attention-deficit hyperactivity disorder, predominantly inattentive type: Secondary | ICD-10-CM

## 2014-01-26 DIAGNOSIS — F329 Major depressive disorder, single episode, unspecified: Secondary | ICD-10-CM

## 2014-01-26 MED ORDER — ALPRAZOLAM 1 MG PO TABS: 1.0000 mg | ORAL_TABLET | Freq: Every evening | ORAL | Status: DC | PRN

## 2014-01-26 MED ORDER — METHYLPHENIDATE HCL 20 MG PO TABS: 20.0000 mg | ORAL_TABLET | Freq: Two times a day (BID) | ORAL | Status: DC

## 2014-01-26 MED ORDER — VENLAFAXINE HCL ER 150 MG PO CP24: ORAL_CAPSULE | ORAL | Status: DC

## 2014-01-26 MED ORDER — DULOXETINE HCL 60 MG PO CPEP: 60.0000 mg | ORAL_CAPSULE | Freq: Every day | ORAL | Status: DC
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
## 2014-02-02 ENCOUNTER — Encounter: Payer: Self-pay | Admitting: *Deleted

## 2014-02-08 ENCOUNTER — Ambulatory Visit: Payer: Self-pay | Admitting: Gastroenterology

## 2014-02-21 ENCOUNTER — Other Ambulatory Visit: Payer: Self-pay | Admitting: Family Medicine

## 2014-02-22 NOTE — Telephone Encounter (Signed)
Last AIC 6.4 on 10/25/13.

## 2014-02-22 NOTE — Telephone Encounter (Signed)
Patient NTBS for follow up and lab work  

## 2014-02-24 NOTE — Telephone Encounter (Signed)
Pt notified to make appt

## 2014-03-16 ENCOUNTER — Other Ambulatory Visit: Payer: Self-pay | Admitting: Nurse Practitioner

## 2014-03-24 ENCOUNTER — Ambulatory Visit (INDEPENDENT_AMBULATORY_CARE_PROVIDER_SITE_OTHER): Payer: Medicaid Other | Admitting: Gastroenterology

## 2014-03-24 ENCOUNTER — Encounter: Payer: Self-pay | Admitting: Gastroenterology

## 2014-03-24 VITALS — BP 100/60 | HR 76 | Ht 61.0 in | Wt 149.6 lb

## 2014-03-24 DIAGNOSIS — K5900 Constipation, unspecified: Secondary | ICD-10-CM

## 2014-03-24 DIAGNOSIS — R143 Flatulence: Secondary | ICD-10-CM

## 2014-03-24 DIAGNOSIS — IMO0001 Reserved for inherently not codable concepts without codable children: Secondary | ICD-10-CM

## 2014-03-24 DIAGNOSIS — R1084 Generalized abdominal pain: Secondary | ICD-10-CM

## 2014-03-24 NOTE — Progress Notes (Signed)
History of Present Illness: This is a 53 year old female accompanied by her husband. She has had problems with chronic constipation for many years and her constipation has gradually worsened over the past year or so. She takes magnesium citrate intermittently. She generally has a bowel movement about every week. She previously underwent colonoscopy by Dr. Verl Blalock in December 2011 which was normal. She relates bloating and generalized abdominal pain that improves after a bowel movement.  Allergies  Allergen Reactions  . Gabapentin     Itching, fidgety   Outpatient Prescriptions Prior to Visit  Medication Sig Dispense Refill  . ALPRAZolam (XANAX) 1 MG tablet Take 1 tablet (1 mg total) by mouth at bedtime as needed for anxiety. 30 tablet 2  . DULoxetine (CYMBALTA) 60 MG capsule Take 1 capsule (60 mg total) by mouth daily. 30 capsule 2  . estradiol (ESTRACE) 0.5 MG tablet TAKE ONE TABLET BY MOUTH TWICE DAILY 180 tablet 3  . glucose blood (ONE TOUCH ULTRA TEST) test strip Test 1X per and prn-- dx 250.02 100 each 12  . lisinopril (PRINIVIL,ZESTRIL) 10 MG tablet TAKE ONE (1) TABLET EACH DAY 90 tablet 3  . meloxicam (MOBIC) 15 MG tablet Take 1 tablet (15 mg total) by mouth at bedtime. 90 tablet 3  . metFORMIN (GLUCOPHAGE) 1000 MG tablet TAKE ONE TABLET TWICE A DAY WITH FOOD 60 tablet 0  . methylphenidate (RITALIN) 20 MG tablet Take 1 tablet (20 mg total) by mouth 2 (two) times daily with breakfast and lunch. 60 tablet 0  . omeprazole (PRILOSEC) 20 MG capsule TAKE ONE (1) CAPSULE EACH DAY 30 capsule 3  . ONETOUCH DELICA LANCETS FINE MISC 1 each by Does not apply route daily. Patient test 1X per day and prn-- dx-250.02 100 each 11  . pioglitazone (ACTOS) 15 MG tablet TAKE ONE (1) TABLET EACH DAY 30 tablet 0  . simvastatin (ZOCOR) 20 MG tablet TAKE ONE TABLET DAILY AT BEDTIME 90 tablet 2  . venlafaxine XR (EFFEXOR-XR) 150 MG 24 hr capsule Take 2 in the am 60 capsule 2  . methylphenidate  (RITALIN) 20 MG tablet Take 1 tablet (20 mg total) by mouth 2 (two) times daily with breakfast and lunch. 60 tablet 0  . methylphenidate (RITALIN) 20 MG tablet Take 1 tablet (20 mg total) by mouth 2 (two) times daily with breakfast and lunch. 30 tablet 0  . traMADol (ULTRAM) 50 MG tablet 1 or 2 po q6h prn pain 20 tablet 0   No facility-administered medications prior to visit.   Past Medical History  Diagnosis Date  . Bipolar disorder   . Asthma   . DDD (degenerative disc disease), cervical   . DDD (degenerative disc disease), lumbar   . Hypertension   . Shingles   . Hyperlipidemia   . GERD (gastroesophageal reflux disease)   . Skin lesions, generalized     1.2 CM FLAT FAWN COLOR AT THE T10 AREA JUST RIGHT OF SPINE   . Neuropathy of foot     Feet   . Mole (skin)     pt. thinks its a tick   . Diabetes mellitus without complication   . COPD (chronic obstructive pulmonary disease)   . Depression   . Diabetes mellitus, type II   . Renal cyst   . Gallbladder sludge   . Arthritis    Past Surgical History  Procedure Laterality Date  . Cesarean section      x 2  . Back surgery    .  Abdominal hysterectomy    . Neck surgery     History   Social History  . Marital Status: Married    Spouse Name: Ronnie    Number of Children: 3  . Years of Education: N/A   Occupational History  . disablility    Social History Main Topics  . Smoking status: Passive Smoke Exposure - Never Smoker  . Smokeless tobacco: Never Used  . Alcohol Use: No  . Drug Use: No  . Sexual Activity: Yes    Birth Control/ Protection: Surgical   Other Topics Concern  . None   Social History Narrative   Family History  Problem Relation Age of Onset  . COPD Father   . Alcohol abuse Father   . Depression Daughter   . ADD / ADHD Other   . Alcohol abuse Mother   . Colon cancer Paternal Grandfather   . Heart disease Sister   . Heart disease Brother   . Arthritis Sister     knee replacement  . COPD  Mother     on oxygen  . Diabetes Maternal Grandmother   . Diabetes Sister      Review of Systems: Pertinent positive and negative review of systems were noted in the above HPI section. All other review of systems were otherwise negative.   Physical Exam: General: Well developed , well nourished, no acute distress Head: Normocephalic and atraumatic Eyes:  sclerae anicteric, EOMI Ears: Normal auditory acuity Mouth: No deformity or lesions Neck: Supple, no masses or thyromegaly Lungs: Clear throughout to auscultation Heart: Regular rate and rhythm; no murmurs, rubs or bruits Abdomen: Soft, mild diffuse tenderness without rebound or guarding and non distended. No masses, hepatosplenomegaly or hernias noted. Normal Bowel sounds Musculoskeletal: Symmetrical with no gross deformities  Skin: No lesions on visible extremities Pulses:  Normal pulses noted Extremities: No clubbing, cyanosis, edema or deformities noted Neurological: Alert oriented x 4, grossly nonfocal Cervical Nodes:  No significant cervical adenopathy Inguinal Nodes: No significant inguinal adenopathy Psychological:  Alert and cooperative. Normal mood and affect  Assessment and Recommendations:  1. Chronic constipation associated with abdominal bloating and generalized abdominal pain. Increase dietary fiber and water intake. Begin MiraLAX twice daily. Titrate MiraLAX between 1-3 times daily for adequate bowel movements every day or every other day. Magnesium citrate when necessary. If her symptoms do not resolve with this regimen after about one month she is advised to contact us for consideration of further evaluation. If her symptoms resolve she will follow her PCP.  2. Family history of colon cancer. Higher risk screening colonoscopy due December 2016.

## 2014-03-24 NOTE — Patient Instructions (Signed)
Start over the counter Miralax mixing 17 gram in 8 oz of water twice a day. You can titrate up to three times a day depending on bowel movements.   Take Magnesium Citrate as needed for constipation.  Call back in 3-4 weeks if you see no improvement in your symptoms.   Thank you for choosing me and [facility].  Pricilla Riffle. Dagoberto Ligas., MD., Mar[facility] Regal

## 2014-03-27 ENCOUNTER — Other Ambulatory Visit: Payer: Self-pay | Admitting: Nurse Practitioner

## 2014-04-06 ENCOUNTER — Encounter: Payer: Self-pay | Admitting: Family

## 2014-04-06 ENCOUNTER — Ambulatory Visit (INDEPENDENT_AMBULATORY_CARE_PROVIDER_SITE_OTHER): Payer: Medicaid Other | Admitting: Family

## 2014-04-06 VITALS — BP 114/79 | HR 86 | Temp 98.6°F | Ht 61.0 in | Wt 150.6 lb

## 2014-04-06 DIAGNOSIS — F411 Generalized anxiety disorder: Secondary | ICD-10-CM

## 2014-04-06 DIAGNOSIS — K219 Gastro-esophageal reflux disease without esophagitis: Secondary | ICD-10-CM

## 2014-04-06 DIAGNOSIS — M25551 Pain in right hip: Secondary | ICD-10-CM

## 2014-04-06 DIAGNOSIS — E1165 Type 2 diabetes mellitus with hyperglycemia: Secondary | ICD-10-CM

## 2014-04-06 DIAGNOSIS — Z1321 Encounter for screening for nutritional disorder: Secondary | ICD-10-CM

## 2014-04-06 DIAGNOSIS — F32A Depression, unspecified: Secondary | ICD-10-CM

## 2014-04-06 DIAGNOSIS — F329 Major depressive disorder, single episode, unspecified: Secondary | ICD-10-CM

## 2014-04-06 DIAGNOSIS — E785 Hyperlipidemia, unspecified: Secondary | ICD-10-CM

## 2014-04-06 DIAGNOSIS — I10 Essential (primary) hypertension: Secondary | ICD-10-CM

## 2014-04-06 LAB — POCT GLYCOSYLATED HEMOGLOBIN (HGB A1C): HEMOGLOBIN A1C: 6.4

## 2014-04-06 LAB — POCT UA - MICROALBUMIN: Microalbumin Ur, POC: NEGATIVE mg/L

## 2014-04-06 MED ORDER — TRAMADOL HCL 50 MG PO TABS: 50.0000 mg | ORAL_TABLET | Freq: Three times a day (TID) | ORAL | Status: DC | PRN

## 2014-04-06 MED ORDER — METFORMIN HCL 1000 MG PO TABS: ORAL_TABLET | ORAL | Status: DC

## 2014-04-06 NOTE — Patient Instructions (Addendum)
Health Maintenance Adopting a healthy lifestyle and getting preventive care can go a long way to promote health and wellness. Talk with your health care provider about what schedule of regular examinations is right for you. This is a good chance for you to check in with your provider about disease prevention and staying healthy. In between checkups, there are plenty of things you can do on your own. Experts have done a lot of research about which lifestyle changes and preventive measures are most likely to keep you healthy. Ask your health care provider for more information. WEIGHT AND DIET  Eat a healthy diet  Be sure to include plenty of vegetables, fruits, low-fat dairy products, and lean protein.  Do not eat a lot of foods high in solid fats, added sugars, or salt.  Get regular exercise. This is one of the most important things you can do for your health.  Most adults should exercise for at least 150 minutes each week. The exercise should increase your heart rate and make you sweat (moderate-intensity exercise).  Most adults should also do strengthening exercises at least twice a week. This is in addition to the moderate-intensity exercise.  Maintain a healthy weight  Body mass index (BMI) is a measurement that can be used to identify possible weight problems. It estimates body fat based on height and weight. Your health care provider can help determine your BMI and help you achieve or maintain a healthy weight.  For females 25 years of age and older:   A BMI below 18.5 is considered underweight.  A BMI of 18.5 to 24.9 is normal.  A BMI of 25 to 29.9 is considered overweight.  A BMI of 30 and above is considered obese.  Watch levels of cholesterol and blood lipids  You should start having your blood tested for lipids and cholesterol at 56 years of age, then have this test every 5 years.  You may need to have your cholesterol levels checked more often if:  Your lipid or  cholesterol levels are high.  You are older than 55 years of age.  You are at high risk for heart disease.  CANCER SCREENING   Lung Cancer  Lung cancer screening is recommended for adults 97-92 years old who are at high risk for lung cancer because of a history of smoking.  A yearly low-dose CT scan of the lungs is recommended for people who:  Currently smoke.  Have quit within the past 15 years.  Have at least a 30-pack-year history of smoking. A pack year is smoking an average of one pack of cigarettes a day for 1 year.  Yearly screening should continue until it has been 15 years since you quit.  Yearly screening should stop if you develop a health problem that would prevent you from having lung cancer treatment.  Breast Cancer  Practice breast self-awareness. This means understanding how your breasts normally appear and feel.  It also means doing regular breast self-exams. Let your health care provider know about any changes, no matter how small.  If you are in your 20s or 30s, you should have a clinical breast exam (CBE) by a health care provider every 1-3 years as part of a regular health exam.  If you are 76 or older, have a CBE every year. Also consider having a breast X-ray (mammogram) every year.  If you have a family history of breast cancer, talk to your health care provider about genetic screening.  If you are  at high risk for breast cancer, talk to your health care provider about having an MRI and a mammogram every year.  Breast cancer gene (BRCA) assessment is recommended for women who have family members with BRCA-related cancers. BRCA-related cancers include:  Breast.  Ovarian.  Tubal.  Peritoneal cancers.  Results of the assessment will determine the need for genetic counseling and BRCA1 and BRCA2 testing. Cervical Cancer Routine pelvic examinations to screen for cervical cancer are no longer recommended for nonpregnant women who are considered low  risk for cancer of the pelvic organs (ovaries, uterus, and vagina) and who do not have symptoms. A pelvic examination may be necessary if you have symptoms including those associated with pelvic infections. Ask your health care provider if a screening pelvic exam is right for you.   The Pap test is the screening test for cervical cancer for women who are considered at risk.  If you had a hysterectomy for a problem that was not cancer or a condition that could lead to cancer, then you no longer need Pap tests.  If you are older than 65 years, and you have had normal Pap tests for the past 10 years, you no longer need to have Pap tests.  If you have had past treatment for cervical cancer or a condition that could lead to cancer, you need Pap tests and screening for cancer for at least 20 years after your treatment.  If you no longer get a Pap test, assess your risk factors if they change (such as having a new sexual partner). This can affect whether you should start being screened again.  Some women have medical problems that increase their chance of getting cervical cancer. If this is the case for you, your health care provider may recommend more frequent screening and Pap tests.  The human papillomavirus (HPV) test is another test that may be used for cervical cancer screening. The HPV test looks for the virus that can cause cell changes in the cervix. The cells collected during the Pap test can be tested for HPV.  The HPV test can be used to screen women 30 years of age and older. Getting tested for HPV can extend the interval between normal Pap tests from three to five years.  An HPV test also should be used to screen women of any age who have unclear Pap test results.  After 55 years of age, women should have HPV testing as often as Pap tests.  Colorectal Cancer  This type of cancer can be detected and often prevented.  Routine colorectal cancer screening usually begins at 55 years of  age and continues through 55 years of age.  Your health care provider may recommend screening at an earlier age if you have risk factors for colon cancer.  Your health care provider may also recommend using home test kits to check for hidden blood in the stool.  A small camera at the end of a tube can be used to examine your colon directly (sigmoidoscopy or colonoscopy). This is done to check for the earliest forms of colorectal cancer.  Routine screening usually begins at age 50.  Direct examination of the colon should be repeated every 5-10 years through 55 years of age. However, you may need to be screened more often if early forms of precancerous polyps or small growths are found. Skin Cancer  Check your skin from head to toe regularly.  Tell your health care provider about any new moles or changes in   moles, especially if there is a change in a mole's shape or color.  Also tell your health care provider if you have a mole that is larger than the size of a pencil eraser.  Always use sunscreen. Apply sunscreen liberally and repeatedly throughout the day.  Protect yourself by wearing long sleeves, pants, a wide-brimmed hat, and sunglasses whenever you are outside. HEART DISEASE, DIABETES, AND HIGH BLOOD PRESSURE   Have your blood pressure checked at least every 1-2 years. High blood pressure causes heart disease and increases the risk of stroke.  If you are between 75 years and 42 years old, ask your health care provider if you should take aspirin to prevent strokes.  Have regular diabetes screenings. This involves taking a blood sample to check your fasting blood sugar level.  If you are at a normal weight and have a low risk for diabetes, have this test once every three years after 55 years of age.  If you are overweight and have a high risk for diabetes, consider being tested at a younger age or more often. PREVENTING INFECTION  Hepatitis B  If you have a higher risk for  hepatitis B, you should be screened for this virus. You are considered at high risk for hepatitis B if:  You were born in a country where hepatitis B is common. Ask your health care provider which countries are considered high risk.  Your parents were born in a high-risk country, and you have not been immunized against hepatitis B (hepatitis B vaccine).  You have HIV or AIDS.  You use needles to inject street drugs.  You live with someone who has hepatitis B.  You have had sex with someone who has hepatitis B.  You get hemodialysis treatment.  You take certain medicines for conditions, including cancer, organ transplantation, and autoimmune conditions. Hepatitis C  Blood testing is recommended for:  Everyone born from [birth year] through 1966.  Anyone with known risk factors for hepatitis C. Sexually transmitted infections (STIs)  You should be screened for sexually transmitted infections (STIs) including gonorrhea and chlamydia if:  You are sexually active and are younger than 55 years of age.  You are older than 55 years of age and your health care provider tells you that you are at risk for this type of infection.  Your sexual activity has changed since you were last screened and you are at an increased risk for chlamydia or gonorrhea. Ask your health care provider if you are at risk.  If you do not have HIV, but are at risk, it may be recommended that you take a prescription medicine daily to prevent HIV infection. This is called pre-exposure prophylaxis (PrEP). You are considered at risk if:  You are sexually active and do not regularly use condoms or know the HIV status of your partner(s).  You take drugs by injection.  You are sexually active with a partner who has HIV. Talk with your health care provider about whether you are at high risk of being infected with HIV. If you choose to begin PrEP, you should first be tested for HIV. You should then be tested every 3 months for  as long as you are taking PrEP.  PREGNANCY   If you are premenopausal and you may become pregnant, ask your health care provider about preconception counseling.  If you may become pregnant, take 400 to 800 micrograms (mcg) of folic acid every day.  If you want to prevent pregnancy, talk to your  vitamin D supplement to lower your risk for osteoporosis. Menopause may have certain physical symptoms and risks. Hormone replacement therapy may reduce some of these symptoms and risks. Talk to your health care provider about whether hormone replacement therapy is right for you.  HOME CARE INSTRUCTIONS  Schedule regular health, dental, and eye exams. Stay current with your immunizations.  Do not use any tobacco products including cigarettes, chewing tobacco, or electronic cigarettes. If you are pregnant, do not drink alcohol. If you are breastfeeding, limit how much and how often you drink alcohol. Limit alcohol intake to no more than 1 drink per day for nonpregnant women. One drink equals 12 ounces of beer, 5 ounces of wine, or 1 ounces of hard liquor. Do not use street drugs. Do not share needles. Ask your health care provider for help if you need support or information about quitting drugs. Tell your health care provider if you often feel depressed. Tell your health care provider if you have ever been abused or do not feel safe at home. Document Released: 09/24/2010 Document Revised: 07/26/2013 Document Reviewed: 02/10/2013 [employer] Patient Information 2015 [employer]. This information is not intended to replace advice given to you by your health care provider. Make sure  you discuss any questions you have with your health care provider. Arthritis, Nonspecific Arthritis is inflammation of a joint. This usually means pain, redness, warmth or swelling are present. One or more joints may be involved. There are a number of types of arthritis. Your caregiver may not be able to tell what type of arthritis you have right away. CAUSES  The most common cause of arthritis is the wear and tear on the joint (osteoarthritis). This causes damage to the cartilage, which can break down over time. The knees, hips, back and neck are most often affected by this type of arthritis. Other types of arthritis and common causes of joint pain include:  Sprains and other injuries near the joint. Sometimes minor sprains and injuries cause pain and swelling that develop hours later.  Rheumatoid arthritis. This affects hands, feet and knees. It usually affects both sides of your body at the same time. It is often associated with chronic ailments, fever, weight loss and general weakness.  Crystal arthritis. Gout and pseudo gout can cause occasional acute severe pain, redness and swelling in the foot, ankle, or knee.  Infectious arthritis. Bacteria can get into a joint through a break in overlying skin. This can cause infection of the joint. Bacteria and viruses can also spread through the blood and affect your joints.  Drug, infectious and allergy reactions. Sometimes joints can become mildly painful and slightly swollen with these types of illnesses. SYMPTOMS   Pain is the main symptom.  Your joint or joints can also be red, swollen and warm or hot to the touch.  You may have a fever with certain types of arthritis, or even feel overall ill.  The joint with arthritis will hurt with movement. Stiffness is present with some types of arthritis. DIAGNOSIS  Your caregiver will suspect arthritis based on your description of your symptoms and on your exam. Testing may be needed to find the type  of arthritis:  Blood and sometimes urine tests.  X-ray tests and sometimes CT or MRI scans.  Removal of fluid from the joint (arthrocentesis) is done to check for bacteria, crystals or other causes. Your caregiver (or a specialist) will numb the area over the joint with a local anesthetic, and use a needle   overall ill.  The joint with arthritis will hurt  with movement. Stiffness is present with some types of arthritis. DIAGNOSIS  Your caregiver will suspect arthritis based on your description of your symptoms and on your exam. Testing may be needed to find the type of arthritis:  Blood and sometimes urine tests.  X-ray tests and sometimes CT or MRI scans.  Removal of fluid from the joint (arthrocentesis) is done to check for bacteria, crystals or other causes. Your caregiver (or a specialist) will numb the area over the joint with a local anesthetic, and use a needle to remove joint fluid for examination. This procedure is only minimally uncomfortable.  Even with these tests, your caregiver may not be able to tell what kind of arthritis you have. Consultation with a specialist (rheumatologist) may be helpful. TREATMENT  Your caregiver will discuss with you treatment specific to your type of arthritis. If the specific type cannot be determined, then the following general recommendations may apply. Treatment of severe joint pain includes:  Rest.  Elevation.  Anti-inflammatory medication (for example, ibuprofen) may be prescribed. Avoiding activities that cause increased pain.  Only take over-the-counter or prescription medicines for pain and discomfort as recommended by your caregiver.  Cold packs over an inflamed joint may be used for 10 to 15 minutes every hour. Hot packs sometimes feel better, but do not use overnight. Do not use hot packs if you are diabetic without your caregiver's permission.  A cortisone shot into arthritic joints may help reduce pain and swelling.  Any acute arthritis that gets worse over the next 1 to 2 days needs to be looked at to be sure there is no joint infection. Long-term arthritis treatment involves modifying activities and lifestyle to reduce joint stress jarring. This can include weight loss. Also, exercise is needed to nourish the joint cartilage and remove waste. This helps keep the muscles around the  joint strong. HOME CARE INSTRUCTIONS   Do not take aspirin to relieve pain if gout is suspected. This elevates uric acid levels.  Only take over-the-counter or prescription medicines for pain, discomfort or fever as directed by your caregiver.  Rest the joint as much as possible.  If your joint is swollen, keep it elevated.  Use crutches if the painful joint is in your leg.  Drinking plenty of fluids may help for certain types of arthritis.  Follow your caregiver's dietary instructions.  Try low-impact exercise such as:  Swimming.  Water aerobics.  Biking.  Walking.  Morning stiffness is often relieved by a warm shower.  Put your joints through regular range-of-motion. SEEK MEDICAL CARE IF:   You do not feel better in 24 hours or are getting worse.  You have side effects to medications, or are not getting better with treatment. SEEK IMMEDIATE MEDICAL CARE IF:   You have a fever.  You develop severe joint pain, swelling or redness.  Many joints are involved and become painful and swollen.  There is severe back pain and/or leg weakness.  You have loss of bowel or bladder control. Document Released: 04/18/2004 Document Revised: 06/03/2011 Document Reviewed: 05/04/2008 [facility] Patient Information 2015 [place], [place]. This information is not intended to replace advice given to you by your health care provider. Make sure you discuss any questions you have with your health care provider.

## 2014-04-06 NOTE — Progress Notes (Signed)
Subjective:    Patient ID: HAIVYN ORAVEC, female    DOB: June 01, 1960, 54 y.o.   MRN: [id number]  Diabetes She presents for her follow-up diabetic visit. She has type 2 diabetes mellitus. Her disease course has been fluctuating. Hypoglycemia symptoms include dizziness and nervousness/anxiousness. Pertinent negatives for hypoglycemia include no headaches. Pertinent negatives for diabetes include no blurred vision, no chest pain and no visual change. Pertinent negatives for diabetic complications include no CVA, heart disease or peripheral neuropathy. Risk factors for coronary artery disease include diabetes mellitus, dyslipidemia, family history, hypertension and obesity. Current diabetic treatment includes oral agent (dual therapy). She is compliant with treatment all of the time. She is following a generally healthy diet. Her breakfast blood glucose range is generally 140-180 mg/dl. An ACE inhibitor/angiotensin II receptor blocker is being taken. Eye exam is current.  Hypertension This is a chronic problem. The current episode started more than 1 year ago. The problem has been resolved since onset. The problem is controlled. Associated symptoms include anxiety and shortness of breath ("at times" pt has asthma). Pertinent negatives include no blurred vision, chest pain, headaches, palpitations or peripheral edema. Risk factors for coronary artery disease include diabetes mellitus, dyslipidemia, obesity and post-menopausal state. Past treatments include ACE inhibitors. The current treatment provides significant improvement. There is no history of kidney disease, CAD/MI, CVA, heart failure or a thyroid problem. There is no history of sleep apnea.  Hyperlipidemia This is a chronic problem. The current episode started more than 1 year ago. The problem is controlled. Recent lipid tests were reviewed and are normal. Exacerbating diseases include diabetes. She has no history of hypothyroidism. Factors  aggravating her hyperlipidemia include fatty foods. Associated symptoms include shortness of breath ("at times" pt has asthma). Pertinent negatives include no chest pain, leg pain or myalgias. Current antihyperlipidemic treatment includes statins. The current treatment provides moderate improvement of lipids. Risk factors for coronary artery disease include diabetes mellitus, dyslipidemia, family history, hypertension and obesity.  Anxiety Symptoms include dizziness, nervous/anxious behavior and shortness of breath ("at times" pt has asthma). Patient reports no chest pain, nausea or palpitations. Symptoms occur rarely. The severity of symptoms is mild. The quality of sleep is good.   Her past medical history is significant for anxiety/panic attacks and depression. Past treatments include SSRIs and benzodiazephines.  Gastrophageal Reflux She complains of abdominal pain (Right side-Appointment  GI appt 09/17). She reports no chest pain, no coughing, no nausea or no sore throat. This is a chronic problem. The current episode started more than 1 year ago. The problem occurs rarely. The symptoms are aggravated by lying down and certain foods. She has tried a PPI for the symptoms. The treatment provided significant relief.      Review of Systems  Constitutional: Negative.   HENT: Negative.  Negative for sore throat.   Eyes: Negative.  Negative for blurred vision.  Respiratory: Positive for shortness of breath ("at times" pt has asthma). Negative for cough.   Cardiovascular: Negative.  Negative for chest pain and palpitations.  Gastrointestinal: Positive for abdominal pain (Right side-Appointment  GI appt 09/17). Negative for nausea.  Endocrine: Negative.   Genitourinary: Negative.   Musculoskeletal: Negative.  Negative for myalgias.  Neurological: Positive for dizziness. Negative for headaches.  Hematological: Negative.   Psychiatric/Behavioral: The patient is nervous/anxious.   All other systems  reviewed and are negative.      Objective:   Physical Exam  Constitutional: She is oriented to person, place, and time. She  appears well-developed and well-nourished. No distress.  HENT:  Head: Normocephalic and atraumatic.  Right Ear: External ear normal.  Left Ear: External ear normal.  Nose: Nose normal.  Mouth/Throat: Oropharynx is clear and moist.  Eyes: Pupils are equal, round, and reactive to light.  Neck: Normal range of motion. Neck supple. No thyromegaly present.  Cardiovascular: Normal rate, regular rhythm, normal heart sounds and intact distal pulses.   No murmur heard. Pulmonary/Chest: Effort normal and breath sounds normal. No respiratory distress. She has no wheezes.  Abdominal: Soft. Bowel sounds are normal. She exhibits no distension. There is no tenderness.  Musculoskeletal: Normal range of motion. She exhibits no edema or tenderness.  Neurological: She is alert and oriented to person, place, and time. She has normal reflexes. No cranial nerve deficit.  Skin: Skin is warm and dry.  Psychiatric: She has a normal mood and affect. Her behavior is normal. Judgment and thought content normal.  Vitals reviewed.   BP 114/79 mmHg  Pulse 86  Temp(Src) 98.6 F (37 C) (Oral)  Ht _0  (1.549 m)  Wt 150 lb 9.6 oz (68.312 kg)  BMI 28.47 kg/m2       Assessment & Plan:  1. Essential hypertension - CMP14+EGFR  2. Gastroesophageal reflux disease without esophagitis - CMP14+EGFR  3. Type 2 diabetes mellitus with hyperglycemia - CMP14+EGFR - POCT UA - Microalbumin - POCT glycosylated hemoglobin (Hb A1C) - metFORMIN (GLUCOPHAGE) 1000 MG tablet; TAKE ONE TABLET TWICE A DAY WITH FOOD  Dispense: 180 tablet; Refill: 3  4. Hyperlipidemia - CMP14+EGFR - Lipid panel  5. GAD (generalized anxiety disorder) - CMP14+EGFR  6. Depression - CMP14+EGFR  7. Encounter for vitamin deficiency screening - Vit D  25 hydroxy (rtn osteoporosis monitoring)  8. Right hip pain -  Arthritis Panel - traMADol (ULTRAM) 50 MG tablet; Take 1 tablet (50 mg total) by mouth every 8 (eight) hours as needed.  Dispense: 30 tablet; Refill: 1   Continue all meds Labs pending Health Maintenance reviewed Diet and exercise encouraged RTO 6 months  Evelina Dun, FNP

## 2014-04-07 ENCOUNTER — Telehealth: Payer: Self-pay | Admitting: *Deleted

## 2014-04-07 LAB — CMP14+EGFR
ALBUMIN: 4.2 g/dL (ref 3.5–5.5)
ALK PHOS: 45 IU/L (ref 39–117)
ALT: 7 IU/L (ref 0–32)
AST: 12 IU/L (ref 0–40)
Albumin/Globulin Ratio: 1.7 (ref 1.1–2.5)
BUN / CREAT RATIO: 14 (ref 9–23)
BUN: 10 mg/dL (ref 6–24)
CO2: 25 mmol/L (ref 18–29)
Calcium: 9.7 mg/dL (ref 8.7–10.2)
Chloride: 100 mmol/L (ref 97–108)
Creatinine, Ser: 0.71 mg/dL (ref 0.57–1.00)
GFR calc Af Amer: 112 mL/min/{1.73_m2} (ref >59)
GFR calc non Af Amer: 97 mL/min/{1.73_m2} (ref >59)
GLUCOSE: 109 mg/dL — AB (ref 65–99)
Globulin, Total: 2.5 g/dL (ref 1.5–4.5)
POTASSIUM: 4.4 mmol/L (ref 3.5–5.2)
Sodium: 141 mmol/L (ref 134–144)
Total Bilirubin: <0.2 mg/dL (ref 0.0–1.2)
Total Protein: 6.7 g/dL (ref 6.0–8.5)

## 2014-04-07 LAB — ARTHRITIS PANEL
Basophils Absolute: 0 10*3/uL (ref 0.0–0.2)
Basos: 1 %
Eos: 2 %
Eosinophils Absolute: 0.1 10*3/uL (ref 0.0–0.4)
HEMATOCRIT: 39.1 % (ref 34.0–46.6)
Hemoglobin: 12.4 g/dL (ref 11.1–15.9)
Immature Grans (Abs): 0 10*3/uL (ref 0.0–0.1)
Immature Granulocytes: 0 %
LYMPHS ABS: 1.9 10*3/uL (ref 0.7–3.1)
Lymphs: 35 %
MCH: 29.7 pg (ref 26.6–33.0)
MCHC: 31.7 g/dL (ref 31.5–35.7)
MCV: 94 fL (ref 79–97)
Monocytes Absolute: 0.4 10*3/uL (ref 0.1–0.9)
Monocytes: 7 %
NEUTROS ABS: 3 10*3/uL (ref 1.4–7.0)
Neutrophils Relative %: 55 %
PLATELETS: 245 10*3/uL (ref 150–379)
RBC: 4.17 x10E6/uL (ref 3.77–5.28)
RDW: 13.3 % (ref 12.3–15.4)
RHEUMATOID FACTOR: 10.1 [IU]/mL (ref 0.0–13.9)
Sed Rate: 8 mm/hr (ref 0–40)
Uric Acid: 3.7 mg/dL (ref 2.5–7.1)
WBC: 5.4 10*3/uL (ref 3.4–10.8)

## 2014-04-07 LAB — LIPID PANEL
CHOL/HDL RATIO: 2 ratio (ref 0.0–4.4)
Cholesterol, Total: 167 mg/dL (ref 100–199)
HDL: 85 mg/dL (ref >39)
LDL Calculated: 57 mg/dL (ref 0–99)
Triglycerides: 125 mg/dL (ref 0–149)
VLDL Cholesterol Cal: 25 mg/dL (ref 5–40)

## 2014-04-07 LAB — VITAMIN D 25 HYDROXY (VIT D DEFICIENCY, FRACTURES): Vit D, 25-Hydroxy: 31.4 ng/mL (ref 30.0–100.0)

## 2014-04-07 NOTE — Telephone Encounter (Signed)
-----   Message from Sharion Balloon, [employer] sent at 04/07/2014  8:35 AM EST ----- Microalbumin negative HgbA1C- WNL Kidney and liver function stable Cholesterol levels WNL Vit D levels WNL Arthritis Panel (Urice Acid, Rhuematoid, WBC, Hgb, & Plts)-WNL

## 2014-04-07 NOTE — Telephone Encounter (Signed)
Pt notified of results Verbalizes understanding 

## 2014-04-17 ENCOUNTER — Other Ambulatory Visit: Payer: Self-pay | Admitting: Family

## 2014-04-17 ENCOUNTER — Other Ambulatory Visit: Payer: Self-pay | Admitting: Nurse Practitioner

## 2014-04-19 ENCOUNTER — Telehealth: Payer: Self-pay | Admitting: Family

## 2014-04-19 DIAGNOSIS — M25551 Pain in right hip: Secondary | ICD-10-CM

## 2014-04-19 MED ORDER — NAPROXEN 500 MG PO TABS: 500.0000 mg | ORAL_TABLET | Freq: Two times a day (BID) | ORAL | Status: DC

## 2014-04-19 NOTE — Telephone Encounter (Signed)
RX sent to pharmacy per pt's request. Pt needs to take medication with food. Orthopedic referral sent per pt's request

## 2014-04-25 ENCOUNTER — Other Ambulatory Visit (HOSPITAL_COMMUNITY): Payer: Self-pay | Admitting: Psychiatry

## 2014-04-25 LAB — HM DIABETES EYE EXAM

## 2014-04-26 ENCOUNTER — Encounter: Payer: Self-pay | Admitting: *Deleted

## 2014-04-28 ENCOUNTER — Ambulatory Visit (INDEPENDENT_AMBULATORY_CARE_PROVIDER_SITE_OTHER): Payer: MEDICAID | Admitting: Psychiatry

## 2014-04-28 ENCOUNTER — Encounter (HOSPITAL_COMMUNITY): Payer: Self-pay | Admitting: Psychiatry

## 2014-04-28 VITALS — BP 130/82 | HR 82 | Ht 61.0 in | Wt 150.0 lb

## 2014-04-28 DIAGNOSIS — F332 Major depressive disorder, recurrent severe without psychotic features: Secondary | ICD-10-CM

## 2014-04-28 DIAGNOSIS — F988 Other specified behavioral and emotional disorders with onset usually occurring in childhood and adolescence: Secondary | ICD-10-CM

## 2014-04-28 DIAGNOSIS — F4310 Post-traumatic stress disorder, unspecified: Secondary | ICD-10-CM

## 2014-04-28 DIAGNOSIS — F900 Attention-deficit hyperactivity disorder, predominantly inattentive type: Secondary | ICD-10-CM

## 2014-04-28 MED ORDER — METHYLPHENIDATE HCL 20 MG PO TABS: 20.0000 mg | ORAL_TABLET | Freq: Two times a day (BID) | ORAL | Status: DC

## 2014-04-28 MED ORDER — DULOXETINE HCL 60 MG PO CPEP: 60.0000 mg | ORAL_CAPSULE | Freq: Every day | ORAL | Status: DC

## 2014-04-28 MED ORDER — ALPRAZOLAM 1 MG PO TABS: 1.0000 mg | ORAL_TABLET | Freq: Every evening | ORAL | Status: DC | PRN

## 2014-04-28 MED ORDER — VENLAFAXINE HCL ER 150 MG PO CP24: ORAL_CAPSULE | ORAL | Status: DC
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
## 2014-06-24 ENCOUNTER — Emergency Department (HOSPITAL_COMMUNITY)
Admission: EM | Admit: 2014-06-24 | Discharge: 2014-06-24 | Disposition: A | Discharge status: Home / Self Care | Payer: Medicaid Other | Attending: Emergency Medicine | Admitting: Emergency Medicine

## 2014-06-24 ENCOUNTER — Encounter (HOSPITAL_COMMUNITY): Payer: Self-pay | Admitting: Emergency Medicine

## 2014-06-24 DIAGNOSIS — I10 Essential (primary) hypertension: Secondary | ICD-10-CM | POA: Diagnosis not present

## 2014-06-24 DIAGNOSIS — Z9889 Other specified postprocedural states: Secondary | ICD-10-CM | POA: Insufficient documentation

## 2014-06-24 DIAGNOSIS — M1990 Unspecified osteoarthritis, unspecified site: Secondary | ICD-10-CM | POA: Insufficient documentation

## 2014-06-24 DIAGNOSIS — E119 Type 2 diabetes mellitus without complications: Secondary | ICD-10-CM | POA: Insufficient documentation

## 2014-06-24 DIAGNOSIS — Z791 Long term (current) use of non-steroidal anti-inflammatories (NSAID): Secondary | ICD-10-CM | POA: Diagnosis not present

## 2014-06-24 DIAGNOSIS — J45909 Unspecified asthma, uncomplicated: Secondary | ICD-10-CM | POA: Diagnosis not present

## 2014-06-24 DIAGNOSIS — G8929 Other chronic pain: Secondary | ICD-10-CM | POA: Insufficient documentation

## 2014-06-24 DIAGNOSIS — M545 Low back pain, unspecified: Secondary | ICD-10-CM

## 2014-06-24 DIAGNOSIS — J449 Chronic obstructive pulmonary disease, unspecified: Secondary | ICD-10-CM | POA: Insufficient documentation

## 2014-06-24 DIAGNOSIS — Z8619 Personal history of other infectious and parasitic diseases: Secondary | ICD-10-CM | POA: Insufficient documentation

## 2014-06-24 DIAGNOSIS — Z872 Personal history of diseases of the skin and subcutaneous tissue: Secondary | ICD-10-CM | POA: Diagnosis not present

## 2014-06-24 DIAGNOSIS — F319 Bipolar disorder, unspecified: Secondary | ICD-10-CM | POA: Diagnosis not present

## 2014-06-24 DIAGNOSIS — E785 Hyperlipidemia, unspecified: Secondary | ICD-10-CM | POA: Diagnosis not present

## 2014-06-24 DIAGNOSIS — G5790 Unspecified mononeuropathy of unspecified lower limb: Secondary | ICD-10-CM | POA: Insufficient documentation

## 2014-06-24 DIAGNOSIS — R0602 Shortness of breath: Secondary | ICD-10-CM | POA: Insufficient documentation

## 2014-06-24 DIAGNOSIS — K219 Gastro-esophageal reflux disease without esophagitis: Secondary | ICD-10-CM | POA: Insufficient documentation

## 2014-06-24 DIAGNOSIS — Z8742 Personal history of other diseases of the female genital tract: Secondary | ICD-10-CM | POA: Insufficient documentation

## 2014-06-24 DIAGNOSIS — Z79899 Other long term (current) drug therapy: Secondary | ICD-10-CM | POA: Insufficient documentation

## 2014-06-24 HISTORY — DX: Dorsalgia, unspecified: M54.9

## 2014-06-24 HISTORY — DX: Cervicalgia: M54.2

## 2014-06-24 HISTORY — DX: Radiculopathy, lumbar region: M54.16

## 2014-06-24 HISTORY — DX: Other chronic pain: G89.29

## 2014-06-24 MED ORDER — CELECOXIB 100 MG PO CAPS: 100.0000 mg | ORAL_CAPSULE | Freq: Two times a day (BID) | ORAL | Status: DC

## 2014-06-24 MED ORDER — CYCLOBENZAPRINE HCL 10 MG PO TABS: 10.0000 mg | ORAL_TABLET | Freq: Two times a day (BID) | ORAL | Status: DC | PRN

## 2014-06-24 NOTE — ED Notes (Signed)
Lower back pain that radiates down rt hip as burning sensation. Denies any injury. Denies any gi/gu difficulties.

## 2014-06-24 NOTE — ED Notes (Signed)
Patient with no complaints at this time. Respirations even and unlabored. Skin warm/dry. Discharge instructions reviewed with patient at this time. Patient given opportunity to voice concerns/ask questions. Patient discharged at this time and left Emergency Department with steady gait.   

## 2014-06-24 NOTE — ED Provider Notes (Signed)
CSN: [id number]     Arrival date & time 06/24/14  1553 History   First MD Initiated Contact with Patient 06/24/14 1704     Chief Complaint  Patient presents with  . Back Pain     (Consider location/radiation/quality/duration/timing/severity/associated sxs/prior Treatment) Patient is a 54 y.o. female presenting with back pain. The history is provided by the patient.  Back Pain Location:  Lumbar spine Quality:  Aching and burning Radiates to:  L thigh Pain severity:  Moderate Onset quality:  Gradual Duration:  4 days Timing:  Intermittent Progression:  Worsening Chronicity:  Chronic Context comment:  Hx of surgery for ddd in back and neck Relieved by:  Nothing Worsened by:  Movement Ineffective treatments:  None tried Associated symptoms: no abdominal pain, no bladder incontinence, no bowel incontinence, no chest pain, no dysuria and no perianal numbness     Past Medical History  Diagnosis Date  . Bipolar disorder   . Asthma   . DDD (degenerative disc disease), cervical   . DDD (degenerative disc disease), lumbar   . Hypertension   . Shingles   . Hyperlipidemia   . GERD (gastroesophageal reflux disease)   . Skin lesions, generalized     1.2 CM FLAT FAWN COLOR AT THE T10 AREA JUST RIGHT OF SPINE   . Neuropathy of foot     Feet   . Mole (skin)     pt. thinks its a tick   . Diabetes mellitus without complication   . COPD (chronic obstructive pulmonary disease)   . Depression   . Diabetes mellitus, type II   . Renal cyst   . Gallbladder sludge   . Arthritis   . Chronic back pain   . Lumbar radiculopathy   . Chronic neck pain    Past Surgical History  Procedure Laterality Date  . Cesarean section      x 2  . Back surgery    . Abdominal hysterectomy    . Neck surgery     Family History  Problem Relation Age of Onset  . COPD Father   . Alcohol abuse Father   . Depression Daughter   . ADD / ADHD Other   . Alcohol abuse Mother   . Colon cancer Paternal  Grandfather   . Heart disease Sister   . Heart disease Brother   . Arthritis Sister     knee replacement  . COPD Mother     on oxygen  . Diabetes Maternal Grandmother   . Diabetes Sister    History  Substance Use Topics  . Smoking status: Passive Smoke Exposure - Never Smoker  . Smokeless tobacco: Never Used  . Alcohol Use: No   OB History    No data available     Review of Systems  Constitutional: Negative for activity change.       All ROS Neg except as noted in HPI  HENT: Negative for nosebleeds.   Eyes: Negative for photophobia and discharge.  Respiratory: Positive for shortness of breath. Negative for cough and wheezing.   Cardiovascular: Negative for chest pain and palpitations.  Gastrointestinal: Negative for abdominal pain, blood in stool and bowel incontinence.  Genitourinary: Negative for bladder incontinence, dysuria, frequency and hematuria.  Musculoskeletal: Positive for back pain and arthralgias. Negative for neck pain.  Skin: Negative.   Neurological: Negative for dizziness, seizures and speech difficulty.  Psychiatric/Behavioral: Negative for hallucinations and confusion.       Bipolar disorder      Allergies  Gabapentin  Home Medications   Prior to Admission medications   Medication Sig Start Date End Date Taking? Authorizing Provider  ALPRAZolam Duanne Moron) 1 MG tablet Take 1 tablet (1 mg total) by mouth at bedtime as needed for anxiety. 04/28/14 04/28/15  Cloria Spring, MD  DULoxetine (CYMBALTA) 60 MG capsule Take 1 capsule (60 mg total) by mouth daily. 04/28/14 04/28/15  Cloria Spring, MD  estradiol (ESTRACE) 0.5 MG tablet TAKE ONE TABLET BY MOUTH TWICE DAILY 10/25/13   Sharion Balloon, FNP  glucose blood (ONE TOUCH ULTRA TEST) test strip Test 1X per and prn-- dx 250.02 10/25/13   Sharion Balloon, FNP  lisinopril (PRINIVIL,ZESTRIL) 10 MG tablet TAKE ONE (1) TABLET EACH DAY 10/25/13   Sharion Balloon, FNP  meloxicam (MOBIC) 15 MG tablet Take 1 tablet (15 mg total)  by mouth at bedtime. 10/25/13   Sharion Balloon, FNP  metFORMIN (GLUCOPHAGE) 1000 MG tablet TAKE ONE TABLET TWICE A DAY WITH FOOD 04/06/14   Sharion Balloon, FNP  methylphenidate (RITALIN) 20 MG tablet Take 1 tablet (20 mg total) by mouth 2 (two) times daily with breakfast and lunch. 04/28/14 04/28/15  Cloria Spring, MD  methylphenidate (RITALIN) 20 MG tablet Take 1 tablet (20 mg total) by mouth 2 (two) times daily with breakfast and lunch. 04/28/14 04/28/15  Cloria Spring, MD  methylphenidate (RITALIN) 20 MG tablet Take 1 tablet (20 mg total) by mouth 2 (two) times daily with breakfast and lunch. 04/28/14 04/28/15  Cloria Spring, MD  naproxen (NAPROSYN) 500 MG tablet Take 1 tablet (500 mg total) by mouth 2 (two) times daily with a meal. 04/19/14   Sharion Balloon, FNP  omeprazole (PRILOSEC) 20 MG capsule TAKE ONE (1) CAPSULE EACH DAY 04/17/14   Sharion Balloon, FNP  ONETOUCH DELICA LANCETS FINE MISC 1 each by Does not apply route daily. Patient test 1X per day and prn-- dx-250.02 10/07/12   Mary-Margaret Hassell Done, FNP  pioglitazone (ACTOS) 15 MG tablet TAKE ONE (1) TABLET EACH DAY 04/17/14   Sharion Balloon, FNP  simvastatin (ZOCOR) 20 MG tablet TAKE ONE TABLET DAILY AT BEDTIME 10/25/13   Sharion Balloon, FNP  venlafaxine XR (EFFEXOR-XR) 150 MG 24 hr capsule Take 2 in the am 04/28/14   Cloria Spring, MD   BP 121/79 mmHg  Pulse 99  Temp(Src) 98.3 F (36.8 C) (Oral)  Resp 18  Ht 5\' 1"  (1.549 m)  Wt 160 lb (72.576 kg)  BMI 30.25 kg/m2  SpO2 100% Physical Exam  Constitutional: She is oriented to person, place, and time. She appears well-developed and well-nourished.  Non-toxic appearance.  HENT:  Head: Normocephalic.  Right Ear: Tympanic membrane and external ear normal.  Left Ear: Tympanic membrane and external ear normal.  Eyes: EOM and lids are normal. Pupils are equal, round, and reactive to light.  Neck: Normal range of motion. Neck supple. Carotid bruit is not present.  Cardiovascular: Normal rate,  regular rhythm, normal heart sounds, intact distal pulses and normal pulses.   Pulmonary/Chest: Breath sounds normal. No respiratory distress.  Abdominal: Soft. Bowel sounds are normal. There is no tenderness. There is no guarding.  Musculoskeletal:       Lumbar back: She exhibits decreased range of motion, tenderness and pain.       Back:  Lymphadenopathy:       Head (right side): No submandibular adenopathy present.       Head (left side): No submandibular adenopathy present.  She has no cervical adenopathy.  Neurological: She is alert and oriented to person, place, and time. She has normal strength. No cranial nerve deficit or sensory deficit.  Skin: Skin is warm and dry.  Psychiatric: She has a normal mood and affect. Her speech is normal.  Nursing note and vitals reviewed.   ED Course  Procedures (including critical care time) Labs Review Labs Reviewed - No data to display  Imaging Review No results found.   EKG Interpretation None      MDM  Pt presents to ED with aching and burning from the lower lumbar to the right hip area. No gross neuro deficit. No rash or signs of infection. No Cauda Equina noted.  Plan: Rx for Flexeril and Celebrex even to the patient. Patient will see Dr. Rolena Infante for additional evaluation.    Final diagnoses:  None    *I have reviewed nursing notes, vital signs, and all appropriate lab and imaging results for this patient.[address], PA-C 06/24/14 1737  Milton Ferguson, MD 06/26/14 [phone]

## 2014-06-24 NOTE — Discharge Instructions (Signed)
Heating pad to your lower back maybe helpful. Please rest your back is much as possible. Please see Dr. Rolena Infante as soon as possible for reevaluation and also for assistance with management of your pain. Please use Celebrex and Flexeril 2 times daily with food. Flexeril may cause drowsiness, please use with caution. Back Pain, Adult Back pain is very common. The pain often gets better over time. The cause of back pain is usually not dangerous. Most people can learn to manage their back pain on their own.  HOME CARE   Stay active. Start with short walks on flat ground if you can. Try to walk farther each day.  Do not sit, drive, or stand in one place for more than 30 minutes. Do not stay in bed.  Do not avoid exercise or work. Activity can help your back heal faster.  Be careful when you bend or lift an object. Bend at your knees, keep the object close to you, and do not twist.  Sleep on a firm mattress. Lie on your side, and bend your knees. If you lie on your back, put a pillow under your knees.  Only take medicines as told by your doctor.  Put ice on the injured area.  Put ice in a plastic bag.  Place a towel between your skin and the bag.  Leave the ice on for 15-20 minutes, 03-04 times a day for the first 2 to 3 days. After that, you can switch between ice and heat packs.  Ask your doctor about back exercises or massage.  Avoid feeling anxious or stressed. Find good ways to deal with stress, such as exercise. GET HELP RIGHT AWAY IF:   Your pain does not go away with rest or medicine.  Your pain does not go away in 1 week.  You have new problems.  You do not feel well.  The pain spreads into your legs.  You cannot control when you poop (bowel movement) or pee (urinate).  Your arms or legs feel weak or lose feeling (numbness).  You feel sick to your stomach (nauseous) or throw up (vomit).  You have belly (abdominal) pain.  You feel like you may pass out (faint). MAKE  SURE YOU:   Understand these instructions.  Will watch your condition.  Will get help right away if you are not doing well or get worse. Document Released: 08/28/2007 Document Revised: 06/03/2011 Document Reviewed: 07/13/2013 [facility] Patient Information 2015 [place], [place]. This information is not intended to replace advice given to you by your health care provider. Make sure you discuss any questions you have with your health care provider.

## 2014-07-04 ENCOUNTER — Other Ambulatory Visit (HOSPITAL_COMMUNITY): Payer: Self-pay | Admitting: Psychiatry

## 2014-07-06 ENCOUNTER — Encounter: Payer: Self-pay | Admitting: Physician Assistant

## 2014-07-06 ENCOUNTER — Ambulatory Visit (INDEPENDENT_AMBULATORY_CARE_PROVIDER_SITE_OTHER): Payer: Medicaid Other | Admitting: Physician Assistant

## 2014-07-06 VITALS — BP 109/64 | HR 89 | Temp 97.4°F | Ht 61.0 in | Wt 152.0 lb

## 2014-07-06 DIAGNOSIS — B370 Candidal stomatitis: Secondary | ICD-10-CM | POA: Diagnosis not present

## 2014-07-06 MED ORDER — FLUCONAZOLE 150 MG PO TABS: ORAL_TABLET | ORAL | Status: DC

## 2014-07-06 NOTE — Progress Notes (Signed)
   Subjective:    Patient ID: Charlotte Perez, female    DOB: [birth year], 54 y.o.   MRN: [id number]  HPI 54 y/o female presents with c/o inability to taste, numbness, burning, tingling in tongue x 1 month. Has tried biotene with no relief. Worse with eating and drinking.     Review of Systems  HENT: Positive for rhinorrhea.        Numbness, tingling in tongue Long white strip on roof of mouth.        Objective:   Physical Exam  HENT:  Mild whitish discoloration on surface of tongue and roof of mouth. Increased redness around rim of tongue          Assessment & Plan:  1. Oropharyngeal Candidiasis: Fluconazole 150mg  PO day 1, repeat in 3 days. F/U in 1 week for recheck

## 2014-07-06 NOTE — Patient Instructions (Signed)
Thrush, Adult  Thrush, also called oral candidiasis, is a fungal infection that develops in the mouth and throat and on the tongue. It causes white patches to form on the mouth and tongue. Thrush is most common in older adults, but it can occur at any age.  Many cases of thrush are mild, but this infection can also be more serious. Thrush can be a recurring problem for people who have chronic illnesses or who take medicines that limit the body's ability to fight infection. Because these people have difficulty fighting infections, the fungus that causes thrush can spread throughout the body. This can cause life-threatening blood or organ infections. CAUSES  Thrush is usually caused by a yeast called Candida albicans. This fungus is normally present in small amounts in the mouth and on other mucous membranes. It usually causes no harm. However, when conditions are present that allow the fungus to grow uncontrolled, it invades surrounding tissues and becomes an infection. Less often, other Candida species can also lead to thrush.  RISK FACTORS Thrush is more likely to develop in the following people:  People with an impaired ability to fight infection (weakened immune system).   Older adults.   People with HIV.   People with diabetes.   People with dry mouth (xerostomia).   Pregnant women.   People with poor dental care, especially those who have false teeth.   People who use antibiotic medicines.  SIGNS AND SYMPTOMS  Thrush can be a mild infection that causes no symptoms. If symptoms develop, they may include:   A burning feeling in the mouth and throat. This can occur at the start of a thrush infection.   White patches that adhere to the mouth and tongue. The tissue around the patches may be red, raw, and painful. If rubbed (during tooth brushing, for example), the patches and the tissue of the mouth may bleed easily.   A bad taste in the mouth or difficulty tasting foods.    Cottony feeling in the mouth.   Pain during eating and swallowing. DIAGNOSIS  Your health care provider can usually diagnose thrush by looking in your mouth and asking you questions about your health.  TREATMENT  Medicines that help prevent the growth of fungi (antifungals) are the standard treatment for thrush. These medicines are either applied directly to the affected area (topical) or swallowed (oral). The treatment will depend on the severity of the condition.  Mild Thrush Mild cases of thrush may clear up with the use of an antifungal mouth rinse or lozenges. Treatment usually lasts about 14 days.  Moderate to Severe Thrush  More severe thrush infections that have spread to the esophagus are treated with an oral antifungal medicine. A topical antifungal medicine may also be used.   For some severe infections, a treatment period longer than 14 days may be needed.   Oral antifungal medicines are almost never used during pregnancy because the fetus may be harmed. However, if a pregnant woman has a rare, severe thrush infection that has spread to her blood, oral antifungal medicines may be used. In this case, the risk of harm to the mother and fetus from the severe thrush infection may be greater than the risk posed by the use of antifungal medicines.  Persistent or Recurrent Thrush For cases of thrush that do not go away or keep coming back, treatment may involve the following:   Treatment may be needed twice as long as the symptoms last.   Treatment will   include both oral and topical antifungal medicines.   People with weakened immune systems can take an antifungal medicine on a continuous basis to prevent thrush infections.  It is important to treat conditions that make you more likely to get thrush, such as diabetes or HIV.  HOME CARE INSTRUCTIONS   Only take over-the-counter or prescription medicine as directed by your health care provider. Talk to your health care  provider about an over-the-counter medicine called gentian violet, which kills bacteria and fungi.   Eat plain, unflavored yogurt as directed by your health care provider. Check the label to make sure the yogurt contains live cultures. This yogurt can help healthy bacteria grow in the mouth that can stop the growth of the fungus that causes thrush.   Try these measures to help reduce the discomfort of thrush:   Drink cold liquids such as water or iced tea.   Try flavored ice treats or frozen juices.   Eat foods that are easy to swallow, such as gelatin, ice cream, or custard.   If the patches in your mouth are painful, try drinking from a straw.   Rinse your mouth several times a day with a warm saltwater rinse. You can make the saltwater mixture with 1 tsp (6 g) of salt in 8 fl oz (0.2 L) of warm water.   If you wear dentures, remove the dentures before going to bed, brush them vigorously, and soak them in a cleaning solution as directed by your health care provider.   Women who are breastfeeding should clean their nipples with an antifungal medicine as directed by their health care provider. Dry the nipples after breastfeeding. Applying lanolin-containing body lotion may help relieve nipple soreness.  SEEK MEDICAL CARE IF:  Your symptoms are getting worse or are not improving within 7 days of starting treatment.   You have symptoms of spreading infection, such as white patches on the skin outside of the mouth.   You are nursing and you have redness, burning, or pain in the nipples that is not relieved with treatment.  MAKE SURE YOU:  Understand these instructions.  Will watch your condition.  Will get help right away if you are not doing well or get worse. Document Released: 12/05/2003 Document Revised: 12/30/2012 Document Reviewed: 10/12/2012 [employer] Patient Information 2015 [employer]. This information is not intended to replace advice given to you by your  health care provider. Make sure you discuss any questions you have with your health care provider.  

## 2014-07-12 ENCOUNTER — Telehealth: Payer: Self-pay | Admitting: Physician Assistant

## 2014-07-12 ENCOUNTER — Other Ambulatory Visit: Payer: Self-pay | Admitting: Physician Assistant

## 2014-07-12 DIAGNOSIS — B370 Candidal stomatitis: Secondary | ICD-10-CM

## 2014-07-12 MED ORDER — NYSTATIN 100000 UNIT/ML MT SUSP: 5.0000 mL | Freq: Four times a day (QID) | OROMUCOSAL | Status: DC

## 2014-07-12 NOTE — Telephone Encounter (Signed)
Prescription sent to pharmacy.

## 2014-07-13 NOTE — Telephone Encounter (Signed)
Pt picked up Rx yesterday when [facility] contacted [facility]

## 2014-07-15 ENCOUNTER — Other Ambulatory Visit (HOSPITAL_COMMUNITY): Payer: Self-pay | Admitting: Psychiatry

## 2014-07-20 ENCOUNTER — Encounter: Payer: Self-pay | Admitting: Family

## 2014-07-20 ENCOUNTER — Ambulatory Visit (INDEPENDENT_AMBULATORY_CARE_PROVIDER_SITE_OTHER): Payer: Medicaid Other | Admitting: Family

## 2014-07-20 VITALS — BP 99/69 | HR 97 | Temp 97.1°F | Ht 61.0 in | Wt 148.6 lb

## 2014-07-20 DIAGNOSIS — R682 Dry mouth, unspecified: Secondary | ICD-10-CM | POA: Diagnosis not present

## 2014-07-20 DIAGNOSIS — R631 Polydipsia: Secondary | ICD-10-CM

## 2014-07-20 NOTE — Patient Instructions (Signed)
Sore or Dry Mouth Care A sore or dry mouth may happen for many different reasons. Sometimes, treatment for other health problems may have to stop until your sore or dry mouth gets better.  HOME CARE  Do not smoke or chew tobacco.  Use fake (artificial) saliva when your mouth feels dry.  Use a humidifier in your bedroom at night.  Eat small meals and snacks.  Eat food cold or at room temperature.  Suck on ice-chips or try frozen ice pops or juice bars, ice-cream, and watermelon. Do not have citrus flavors.  Suck on hard, sugarless, sour candy, or chew sugarless gum to help make more saliva.  Eat soft foods such as yogurt, bananas, canned fruit, mashed potatoes, oatmeal, rice, eggs, cottage cheese, macaroni and cheese, jello, and pudding.  Microwave vegetables and fruits to soften them.  Puree cooked food in a blender if needed.  Make dry food moist by using olive oil, gravy, or mild sauces. Dip foods in liquids.  Keep a glass of water or squirt bottle nearby. Take sips often throughout the day.  Limit caffeine.  Avoid:  Pop or fizzy drinks.  Alcohol.  Citrus juices.  Acidic food.  Salty or spicy food.  Foods or drinks that are very hot.  Hard or crunchy food. Mouth Care  Wash your hands well with soap and water before doing mouth care.  Use fake saliva as told by your doctor.  Use medicine on the sore places.  Brush your teeth at least 2 times a day. Brush after each meal if possible. Rinse your mouth with water after each meal and after drinking a sweet drink.  Brush slowly and gently in small circles. Do not brush side-to-side.  Use regular toothpastes, but stay away from ones that have sodium laurel sulfate in them.  Gargle with a baking soda mouthwash ( teaspoon baking soda mixed in with 4 cups of water).  Gargle with medicated mouthwash.  Use dental floss or dental tape to clean between your teeth every day.  Use a lanolin-based lip balm to keep  your lips from getting dry.  If you wear dentures or bridges:  You may need to leave them out until your doctor tells you to start wearing them again.  Take them out at night if you wear them daily. Soak them in warm water or denture solution. Take your dentures out as much as you can during the day. Take them out when you use mouthwash.  After each meal, brush your gums gently with a soft brush and rinse your mouth with water.  If your dentures rub on your gums and cause a sore spot, have your dentist check and fix your dentures right away. GET HELP RIGHT AWAY IF:   Your mouth gets more painful or dry.  You have questions. MAKE SURE YOU:  Understand these instructions.  Will watch your condition.  Will get help right away if you are not doing well or get worse. Document Released: 01/06/2009 Document Revised: 06/03/2011 Document Reviewed: 01/06/2009 [facility] Patient Information 2015 [place], [place]. This information is not intended to replace advice given to you by your health care provider. Make sure you discuss any questions you have with your health care provider.

## 2014-07-20 NOTE — Progress Notes (Signed)
   Subjective:    Patient ID: Charlotte Perez, female    DOB: 1960-05-09, 54 y.o.   MRN: [id number]  HPI Pt presents to the office today to recheck oral thrush. Pt states this started 4 weeks ago. PT states she can not taste, she is having a burning on her tongue, and "dryness" on her tongue. Pt states she is "losing her voice" since this morning. Pt states she is SOB at times.    Review of Systems  Constitutional: Negative.   HENT: Negative.   Eyes: Negative.   Respiratory: Negative.  Negative for shortness of breath.   Cardiovascular: Negative.  Negative for palpitations.  Gastrointestinal: Negative.   Endocrine: Negative.   Genitourinary: Negative.   Musculoskeletal: Negative.   Neurological: Negative.  Negative for headaches.  Hematological: Negative.   Psychiatric/Behavioral: Negative.   All other systems reviewed and are negative.      Objective:   Physical Exam  Constitutional: She is oriented to person, place, and time. She appears well-developed and well-nourished. No distress.  HENT:  Head: Normocephalic and atraumatic.  Right Ear: External ear normal.  Left Ear: External ear normal.  Nose: Nose normal.  Mouth/Throat: Oropharynx is clear and moist. Mucous membranes are dry.  Pt's voice is hoarse, no thrush noted  Eyes: Pupils are equal, round, and reactive to light.  Neck: Normal range of motion. Neck supple. No thyromegaly present.  Cardiovascular: Normal rate, regular rhythm, normal heart sounds and intact distal pulses.   No murmur heard. Pulmonary/Chest: Effort normal and breath sounds normal. No respiratory distress. She has no wheezes.  Abdominal: Soft. Bowel sounds are normal. She exhibits no distension. There is no tenderness.  Musculoskeletal: Normal range of motion. She exhibits no edema or tenderness.  Neurological: She is alert and oriented to person, place, and time. She has normal reflexes. No cranial nerve deficit.  Skin: Skin is warm and dry.    Psychiatric: She has a normal mood and affect. Her behavior is normal. Judgment and thought content normal.  Vitals reviewed.     BP 99/69 mmHg  Pulse 97  Temp(Src) 97.1 F (36.2 C) (Oral)  Ht $R'5\' 1"'ez$  (1.549 m)  Wt 148 lb 9.6 oz (67.405 kg)  BMI 28.09 kg/m2     Assessment & Plan:  1. Dry mouth - CMP14+EGFR - Thyroid Panel With TSH  2. Excessive thirst - CMP14+EGFR - Thyroid Panel With TSH  Force fluids Get orajel Dry Mouth OTC Pt needs appt with clinical pharmacists to look over medications to see what could be causing dry mouth RTO prn  Evelina Dun, FNP

## 2014-07-21 ENCOUNTER — Encounter: Payer: Self-pay | Admitting: Pharmacist

## 2014-07-21 ENCOUNTER — Ambulatory Visit (INDEPENDENT_AMBULATORY_CARE_PROVIDER_SITE_OTHER): Payer: Medicaid Other | Admitting: Pharmacist

## 2014-07-21 DIAGNOSIS — T887XXA Unspecified adverse effect of drug or medicament, initial encounter: Secondary | ICD-10-CM

## 2014-07-21 DIAGNOSIS — R682 Dry mouth, unspecified: Secondary | ICD-10-CM | POA: Diagnosis not present

## 2014-07-21 DIAGNOSIS — T50905A Adverse effect of unspecified drugs, medicaments and biological substances, initial encounter: Secondary | ICD-10-CM

## 2014-07-21 LAB — CMP14+EGFR
ALBUMIN: 4.2 g/dL (ref 3.5–5.5)
ALT: 8 IU/L (ref 0–32)
AST: 11 IU/L (ref 0–40)
Albumin/Globulin Ratio: 1.7 (ref 1.1–2.5)
Alkaline Phosphatase: 50 IU/L (ref 39–117)
BUN / CREAT RATIO: 35 — AB (ref 9–23)
BUN: 26 mg/dL — ABNORMAL HIGH (ref 6–24)
CO2: 22 mmol/L (ref 18–29)
Calcium: 9.6 mg/dL (ref 8.7–10.2)
Chloride: 97 mmol/L (ref 97–108)
Creatinine, Ser: 0.75 mg/dL (ref 0.57–1.00)
GFR calc Af Amer: 105 mL/min/{1.73_m2} (ref >59)
GFR calc non Af Amer: 91 mL/min/{1.73_m2} (ref >59)
GLUCOSE: 118 mg/dL — AB (ref 65–99)
Globulin, Total: 2.5 g/dL (ref 1.5–4.5)
Potassium: 4.5 mmol/L (ref 3.5–5.2)
Sodium: 137 mmol/L (ref 134–144)
TOTAL PROTEIN: 6.7 g/dL (ref 6.0–8.5)

## 2014-07-21 LAB — THYROID PANEL WITH TSH
Free Thyroxine Index: 2.3 (ref 1.2–4.9)
T3 Uptake Ratio: 20 % — ABNORMAL LOW (ref 24–39)
T4, Total: 11.4 ug/dL (ref 4.5–12.0)
TSH: 1.3 u[IU]/mL (ref 0.450–4.500)
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
## 2014-07-21 NOTE — Patient Instructions (Signed)
Hold duloxetine and see if dry mouth improves - might take 1 to 2 weeks to see full improvement.   Avoid sipping on high acidic beverages such as soda, sport drinks, tea or orange juice.  Ok to use sugar free candy or gums to help stimulate saliva production.  Continue to use Biotene also

## 2014-07-22 ENCOUNTER — Telehealth: Payer: Self-pay | Admitting: *Deleted

## 2014-07-22 NOTE — Telephone Encounter (Signed)
-----   Message from Sharion Balloon, [place] sent at 07/22/2014 10:16 AM EDT ----- Kidney and liver function stable Thyroid levels WNL

## 2014-07-27 ENCOUNTER — Ambulatory Visit (INDEPENDENT_AMBULATORY_CARE_PROVIDER_SITE_OTHER): Payer: MEDICAID | Admitting: Psychiatry

## 2014-07-27 ENCOUNTER — Encounter (HOSPITAL_COMMUNITY): Payer: Self-pay | Admitting: Psychiatry

## 2014-07-27 VITALS — BP 105/71 | HR 83 | Ht 61.0 in | Wt 148.6 lb

## 2014-07-27 DIAGNOSIS — F4310 Post-traumatic stress disorder, unspecified: Secondary | ICD-10-CM

## 2014-07-27 DIAGNOSIS — F332 Major depressive disorder, recurrent severe without psychotic features: Secondary | ICD-10-CM

## 2014-07-27 DIAGNOSIS — F900 Attention-deficit hyperactivity disorder, predominantly inattentive type: Secondary | ICD-10-CM | POA: Diagnosis not present

## 2014-07-27 DIAGNOSIS — F988 Other specified behavioral and emotional disorders with onset usually occurring in childhood and adolescence: Secondary | ICD-10-CM

## 2014-07-27 MED ORDER — METHYLPHENIDATE HCL 20 MG PO TABS
20.0000 mg | ORAL_TABLET | Freq: Two times a day (BID) | ORAL | Status: DC
Start: 2014-07-27 — End: 2014-10-27

## 2014-07-27 MED ORDER — METHYLPHENIDATE HCL 20 MG PO TABS: 20.0000 mg | ORAL_TABLET | Freq: Two times a day (BID) | ORAL | Status: DC

## 2014-07-27 MED ORDER — ALPRAZOLAM 1 MG PO TABS: 1.0000 mg | ORAL_TABLET | Freq: Every evening | ORAL | Status: DC | PRN

## 2014-07-27 MED ORDER — VENLAFAXINE HCL ER 150 MG PO CP24: ORAL_CAPSULE | ORAL | Status: DC
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
## 2014-08-13 ENCOUNTER — Other Ambulatory Visit (HOSPITAL_COMMUNITY): Payer: Self-pay | Admitting: Psychiatry

## 2014-08-16 ENCOUNTER — Telehealth: Payer: Self-pay | Admitting: Family

## 2014-08-18 ENCOUNTER — Telehealth: Payer: Self-pay | Admitting: Family

## 2014-08-18 ENCOUNTER — Other Ambulatory Visit: Payer: Self-pay | Admitting: Family Medicine

## 2014-08-18 DIAGNOSIS — Z1231 Encounter for screening mammogram for malignant neoplasm of breast: Secondary | ICD-10-CM

## 2014-08-18 DIAGNOSIS — E1165 Type 2 diabetes mellitus with hyperglycemia: Secondary | ICD-10-CM

## 2014-08-18 NOTE — Telephone Encounter (Signed)
Patient is requesting a refill on accu check aviva test strips. She states that she has been testing 2-3x a day

## 2014-08-19 MED ORDER — GLUCOSE BLOOD VI STRP: ORAL_STRIP | Status: DC

## 2014-08-19 NOTE — Telephone Encounter (Signed)
Prescription sent to pharmacy.

## 2014-09-21 ENCOUNTER — Ambulatory Visit (HOSPITAL_COMMUNITY): Payer: Self-pay

## 2014-09-23 ENCOUNTER — Ambulatory Visit: Payer: Medicaid Other | Admitting: Family

## 2014-09-29 ENCOUNTER — Ambulatory Visit (HOSPITAL_COMMUNITY): Payer: Self-pay

## 2014-10-07 ENCOUNTER — Other Ambulatory Visit (HOSPITAL_COMMUNITY): Payer: Self-pay | Admitting: Psychiatry

## 2014-10-07 ENCOUNTER — Other Ambulatory Visit: Payer: Self-pay | Admitting: Family

## 2014-10-10 ENCOUNTER — Ambulatory Visit (HOSPITAL_COMMUNITY)
Admission: RE | Admit: 2014-10-10 | Discharge: 2014-10-10 | Disposition: A | Discharge status: Home / Self Care | Payer: Medicaid Other | Source: Ambulatory Visit | Attending: Family Medicine | Admitting: Family Medicine

## 2014-10-10 DIAGNOSIS — Z1231 Encounter for screening mammogram for malignant neoplasm of breast: Secondary | ICD-10-CM | POA: Diagnosis present

## 2014-10-25 ENCOUNTER — Other Ambulatory Visit: Payer: Medicaid Other | Admitting: Family

## 2014-10-27 ENCOUNTER — Ambulatory Visit (INDEPENDENT_AMBULATORY_CARE_PROVIDER_SITE_OTHER): Payer: Medicaid Other | Admitting: Psychiatry

## 2014-10-27 ENCOUNTER — Encounter (HOSPITAL_COMMUNITY): Payer: Self-pay | Admitting: Psychiatry

## 2014-10-27 DIAGNOSIS — F900 Attention-deficit hyperactivity disorder, predominantly inattentive type: Secondary | ICD-10-CM | POA: Diagnosis not present

## 2014-10-27 DIAGNOSIS — F4310 Post-traumatic stress disorder, unspecified: Secondary | ICD-10-CM | POA: Diagnosis not present

## 2014-10-27 DIAGNOSIS — F32A Depression, unspecified: Secondary | ICD-10-CM

## 2014-10-27 DIAGNOSIS — F329 Major depressive disorder, single episode, unspecified: Secondary | ICD-10-CM

## 2014-10-27 MED ORDER — VENLAFAXINE HCL ER 150 MG PO CP24: ORAL_CAPSULE | ORAL | Status: DC

## 2014-10-27 MED ORDER — ALPRAZOLAM 1 MG PO TABS: 1.0000 mg | ORAL_TABLET | Freq: Every evening | ORAL | Status: DC | PRN
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
## 2014-11-04 ENCOUNTER — Other Ambulatory Visit: Payer: Self-pay | Admitting: Family

## 2014-11-04 NOTE — Telephone Encounter (Signed)
Last seen 07/20/14  Charlotte Perez  Last lipid 04/06/14  Requesting 90 day supply

## 2014-11-08 ENCOUNTER — Other Ambulatory Visit: Payer: Medicaid Other | Admitting: Family

## 2014-11-24 ENCOUNTER — Encounter: Payer: Self-pay | Admitting: Family

## 2014-11-24 ENCOUNTER — Ambulatory Visit (INDEPENDENT_AMBULATORY_CARE_PROVIDER_SITE_OTHER): Payer: Medicaid Other | Admitting: Family

## 2014-11-24 ENCOUNTER — Other Ambulatory Visit: Payer: Self-pay | Admitting: Family

## 2014-11-24 VITALS — BP 116/81 | HR 88 | Temp 98.0°F | Ht 61.0 in | Wt 152.6 lb

## 2014-11-24 DIAGNOSIS — F411 Generalized anxiety disorder: Secondary | ICD-10-CM

## 2014-11-24 DIAGNOSIS — F32A Depression, unspecified: Secondary | ICD-10-CM

## 2014-11-24 DIAGNOSIS — K219 Gastro-esophageal reflux disease without esophagitis: Secondary | ICD-10-CM | POA: Diagnosis not present

## 2014-11-24 DIAGNOSIS — E1165 Type 2 diabetes mellitus with hyperglycemia: Secondary | ICD-10-CM | POA: Diagnosis not present

## 2014-11-24 DIAGNOSIS — I10 Essential (primary) hypertension: Secondary | ICD-10-CM

## 2014-11-24 DIAGNOSIS — F329 Major depressive disorder, single episode, unspecified: Secondary | ICD-10-CM | POA: Diagnosis not present

## 2014-11-24 DIAGNOSIS — Z1159 Encounter for screening for other viral diseases: Secondary | ICD-10-CM | POA: Diagnosis not present

## 2014-11-24 DIAGNOSIS — Z01419 Encounter for gynecological examination (general) (routine) without abnormal findings: Secondary | ICD-10-CM

## 2014-11-24 DIAGNOSIS — E785 Hyperlipidemia, unspecified: Secondary | ICD-10-CM | POA: Diagnosis not present

## 2014-11-24 DIAGNOSIS — Z0000 Encounter for general adult medical examination without abnormal findings: Secondary | ICD-10-CM | POA: Diagnosis not present

## 2014-11-24 LAB — POCT URINALYSIS DIPSTICK
Bilirubin, UA: NEGATIVE
Blood, UA: NEGATIVE
Glucose, UA: NEGATIVE
Ketones, UA: NEGATIVE
Leukocytes, UA: NEGATIVE
Nitrite, UA: NEGATIVE
PROTEIN UA: NEGATIVE
SPEC GRAV UA: 1.025
Urobilinogen, UA: NEGATIVE
pH, UA: 6

## 2014-11-24 LAB — POCT UA - MICROSCOPIC ONLY
Bacteria, U Microscopic: NEGATIVE
CASTS, UR, LPF, POC: NEGATIVE
Crystals, Ur, HPF, POC: NEGATIVE
MUCUS UA: NEGATIVE
RBC, urine, microscopic: NEGATIVE
WBC, Ur, HPF, POC: NEGATIVE

## 2014-11-24 LAB — POCT GLYCOSYLATED HEMOGLOBIN (HGB A1C): HEMOGLOBIN A1C: 6.9

## 2014-11-24 LAB — POCT UA - MICROALBUMIN: Microalbumin Ur, POC: 20 mg/L

## 2014-11-24 MED ORDER — METFORMIN HCL 1000 MG PO TABS: ORAL_TABLET | ORAL | Status: DC

## 2014-11-24 MED ORDER — FLUCONAZOLE 150 MG PO TABS: 150.0000 mg | ORAL_TABLET | ORAL | Status: DC

## 2014-11-24 MED ORDER — SIMVASTATIN 20 MG PO TABS: 20.0000 mg | ORAL_TABLET | Freq: Every day | ORAL | Status: DC

## 2014-11-24 MED ORDER — ESTRADIOL 0.5 MG PO TABS: 0.5000 mg | ORAL_TABLET | Freq: Two times a day (BID) | ORAL | Status: DC

## 2014-11-24 MED ORDER — OMEPRAZOLE 20 MG PO CPDR: DELAYED_RELEASE_CAPSULE | ORAL | Status: DC

## 2014-11-24 MED ORDER — LISINOPRIL 10 MG PO TABS: ORAL_TABLET | ORAL | Status: DC

## 2014-11-24 MED ORDER — PIOGLITAZONE HCL 15 MG PO TABS: ORAL_TABLET | ORAL | Status: DC

## 2014-11-24 NOTE — Addendum Note (Signed)
Addended by: Earlene Plater on: 11/24/2014 01:08 PM   Modules accepted: Orders, SmartSet

## 2014-11-24 NOTE — Telephone Encounter (Signed)
done

## 2014-11-24 NOTE — Patient Instructions (Signed)

## 2014-11-24 NOTE — Progress Notes (Signed)
Subjective:    Patient ID: Charlotte Perez, female    DOB: 1960/06/25, 54 y.o.   MRN: [id number]  Pt presents to the office today for CPE with pap.  Gynecologic Exam Pertinent negatives include no abdominal pain, headaches, nausea or sore throat.  Diabetes She presents for her follow-up diabetic visit. She has type 2 diabetes mellitus. Her disease course has been fluctuating. Pertinent negatives for hypoglycemia include no confusion, headaches, mood changes, nervousness/anxiousness, pallor or sleepiness. Pertinent negatives for diabetes include no blurred vision, no foot paresthesias, no foot ulcerations and no visual change. Pertinent negatives for hypoglycemia complications include no blackouts and no hospitalization. Symptoms are improving. There are no diabetic complications. Pertinent negatives for diabetic complications include no CVA, heart disease, nephropathy or peripheral neuropathy. Risk factors for coronary artery disease include diabetes mellitus, dyslipidemia, hypertension, stress, sedentary lifestyle and family history. Current diabetic treatment includes oral agent (triple therapy). She is compliant with treatment all of the time. She is following a generally healthy diet. Her breakfast blood glucose range is generally 140-180 mg/dl. An ACE inhibitor/angiotensin II receptor blocker is being taken. Eye exam is current.  Hyperlipidemia This is a chronic problem. The current episode started more than 1 year ago. The problem is controlled. Recent lipid tests were reviewed and are normal. Exacerbating diseases include diabetes. Pertinent negatives include no myalgias or shortness of breath. Current antihyperlipidemic treatment includes statins. The current treatment provides significant improvement of lipids. Risk factors for coronary artery disease include diabetes mellitus, dyslipidemia, family history, hypertension and post-menopausal.  Anxiety Presents for follow-up visit. Onset was 1  to 6 months ago. The problem has been waxing and waning. Symptoms include depressed mood and excessive worry. Patient reports no confusion, irritability, nausea, nervous/anxious behavior, palpitations, panic or shortness of breath. Symptoms occur occasionally. The severity of symptoms is mild.   Her past medical history is significant for anxiety/panic attacks and depression. Past treatments include benzodiazephines and non-SSRI antidepressants. The treatment provided moderate relief. Compliance with prior treatments has been good.  Gastrophageal Reflux She reports no abdominal pain, no coughing, no heartburn, no nausea or no sore throat. This is a chronic problem. The current episode started more than 1 year ago. The problem occurs rarely. The problem has been waxing and waning. The symptoms are aggravated by certain foods. She has tried a PPI for the symptoms. The treatment provided significant relief.  Depression      The patient presents with depression.  This is a chronic (Pt see's DR. Rudean Haskell at behavioral health ) problem.  The current episode started more than 1 year ago.   The onset quality is gradual.   The problem occurs rarely.  Associated symptoms include no myalgias and no headaches.  Past treatments include SNRIs - Serotonin and norepinephrine reuptake inhibitors.  Compliance with treatment is good.  Past medical history includes anxiety and depression.       Review of Systems  Constitutional: Negative.  Negative for irritability.  HENT: Negative.  Negative for sore throat.   Eyes: Negative.  Negative for blurred vision.  Respiratory: Negative.  Negative for cough and shortness of breath.   Cardiovascular: Negative.  Negative for palpitations.  Gastrointestinal: Negative.  Negative for heartburn, nausea and abdominal pain.  Endocrine: Negative.   Genitourinary: Negative.   Musculoskeletal: Negative.  Negative for myalgias.  Skin: Negative for pallor.  Neurological: Negative.   Negative for headaches.  Hematological: Negative.   Psychiatric/Behavioral: Positive for depression. Negative for confusion. The patient  is not nervous/anxious.   All other systems reviewed and are negative.      Objective:   Physical Exam  Constitutional: She is oriented to person, place, and time. She appears well-developed and well-nourished. No distress.  HENT:  Head: Normocephalic and atraumatic.  Right Ear: External ear normal.  Left Ear: External ear normal.  Nose: Nose normal.  Mouth/Throat: Oropharynx is clear and moist.  Eyes: Pupils are equal, round, and reactive to light.  Neck: Normal range of motion. Neck supple. No thyromegaly present.  Cardiovascular: Normal rate, regular rhythm, normal heart sounds and intact distal pulses.   No murmur heard. Pulmonary/Chest: Effort normal and breath sounds normal. No respiratory distress. She has no wheezes. Right breast exhibits no inverted nipple, no mass, no nipple discharge, no skin change and no tenderness. Left breast exhibits no inverted nipple, no mass, no nipple discharge, no skin change and no tenderness. Breasts are symmetrical.  Abdominal: Soft. Bowel sounds are normal. She exhibits no distension. There is no tenderness.  Genitourinary: Vagina normal.  Bimanual exam- no adnexal masses or tenderness, ovaries nonpalpable   Cervix not present- No discharge   Musculoskeletal: Normal range of motion. She exhibits no edema or tenderness.  Neurological: She is alert and oriented to person, place, and time. She has normal reflexes. No cranial nerve deficit.  Skin: Skin is warm and dry.  Psychiatric: She has a normal mood and affect. Her behavior is normal. Judgment and thought content normal.  Vitals reviewed.   BP 116/81 mmHg  Pulse 88  Temp(Src) 98 F (36.7 C) (Oral)  Ht 5' 1" (1.549 m)  Wt 152 lb 9.6 oz (69.219 kg)  BMI 28.85 kg/m2       Assessment & Plan:  1. Type 2 diabetes mellitus with hyperglycemia - POCT  glycosylated hemoglobin (Hb A1C) - POCT urinalysis dipstick - POCT UA - Microscopic Only - POCT UA - Microalbumin - pioglitazone (ACTOS) 15 MG tablet; TAKE ONE (1) TABLET EACH DAY  Dispense: 90 tablet; Refill: 2 - metFORMIN (GLUCOPHAGE) 1000 MG tablet; TAKE ONE TABLET TWICE A DAY WITH FOOD  Dispense: 180 tablet; Refill: 3 - lisinopril (PRINIVIL,ZESTRIL) 10 MG tablet; TAKE ONE (1) TABLET EACH DAY  Dispense: 90 tablet; Refill: 2  2. Essential hypertension - CMP14+EGFR  3. Hyperlipidemia - Lipid panel - simvastatin (ZOCOR) 20 MG tablet; Take 1 tablet (20 mg total) by mouth at bedtime.  Dispense: 90 tablet; Refill: 2  4. Gastroesophageal reflux disease without esophagitis - omeprazole (PRILOSEC) 20 MG capsule; TAKE ONE (1) CAPSULE EACH DAY  Dispense: 90 capsule; Refill: 2  5. Depression  6. GAD (generalized anxiety disorder)  7. Encounter for initial preventive physical examination covered by Medicare - CBC with Differential/Platelet - Thyroid Panel With TSH - Vit D  25 hydroxy (rtn osteoporosis monitoring) - Pap IG w/ reflex to HPV when ASC-U - POCT UA - Microalbumin  8. Laboratory examination ordered as part of a complete physical examination - CBC with Differential/Platelet - Thyroid Panel With TSH - Vit D  25 hydroxy (rtn osteoporosis monitoring) - Pap IG w/ reflex to HPV when ASC-U - POCT UA - Microalbumin  9. Encounter for routine gynecological examination - Pap IG w/ reflex to HPV when ASC-U  10. Need for hepatitis C screening test - Hepatitis C antibody  Continue all meds Labs pending Health Maintenance reviewed Diet and exercise encouraged RTO 3 months   , FNP   

## 2014-11-25 ENCOUNTER — Other Ambulatory Visit: Payer: Self-pay | Admitting: Family

## 2014-11-25 LAB — CMP14+EGFR
A/G RATIO: 1.5 (ref 1.1–2.5)
ALBUMIN: 3.8 g/dL (ref 3.5–5.5)
ALK PHOS: 49 IU/L (ref 39–117)
ALT: 11 IU/L (ref 0–32)
AST: 12 IU/L (ref 0–40)
BUN / CREAT RATIO: 18 (ref 9–23)
BUN: 13 mg/dL (ref 6–24)
CHLORIDE: 99 mmol/L (ref 97–108)
CO2: 25 mmol/L (ref 18–29)
Calcium: 9.6 mg/dL (ref 8.7–10.2)
Creatinine, Ser: 0.74 mg/dL (ref 0.57–1.00)
GFR calc Af Amer: 106 mL/min/{1.73_m2} (ref >59)
GFR calc non Af Amer: 92 mL/min/{1.73_m2} (ref >59)
GLOBULIN, TOTAL: 2.6 g/dL (ref 1.5–4.5)
Glucose: 107 mg/dL — ABNORMAL HIGH (ref 65–99)
POTASSIUM: 5.1 mmol/L (ref 3.5–5.2)
Sodium: 140 mmol/L (ref 134–144)
Total Protein: 6.4 g/dL (ref 6.0–8.5)

## 2014-11-25 LAB — LIPID PANEL
CHOLESTEROL TOTAL: 168 mg/dL (ref 100–199)
Chol/HDL Ratio: 2 ratio units (ref 0.0–4.4)
HDL: 86 mg/dL (ref >39)
LDL Calculated: 54 mg/dL (ref 0–99)
TRIGLYCERIDES: 140 mg/dL (ref 0–149)
VLDL Cholesterol Cal: 28 mg/dL (ref 5–40)

## 2014-11-25 LAB — MICROALBUMIN, URINE: Microalbumin, Urine: 6.5 ug/mL

## 2014-11-29 LAB — PAP IG W/ RFLX HPV ASCU: PAP SMEAR COMMENT: 0

## 2014-11-29 NOTE — Progress Notes (Signed)
Detailed message of pap smear results left on patient voicemail

## 2014-12-03 ENCOUNTER — Other Ambulatory Visit (HOSPITAL_COMMUNITY): Payer: Self-pay | Admitting: Psychiatry

## 2014-12-09 ENCOUNTER — Ambulatory Visit (INDEPENDENT_AMBULATORY_CARE_PROVIDER_SITE_OTHER): Payer: Medicaid Other | Admitting: Family

## 2014-12-09 ENCOUNTER — Encounter: Payer: Self-pay | Admitting: Family

## 2014-12-09 VITALS — BP 118/77 | HR 71 | Temp 97.0°F | Ht 61.0 in | Wt 156.6 lb

## 2014-12-09 DIAGNOSIS — M1990 Unspecified osteoarthritis, unspecified site: Secondary | ICD-10-CM | POA: Diagnosis not present

## 2014-12-09 DIAGNOSIS — M1611 Unilateral primary osteoarthritis, right hip: Secondary | ICD-10-CM

## 2014-12-09 MED ORDER — TRAMADOL HCL 50 MG PO TABS: 50.0000 mg | ORAL_TABLET | Freq: Two times a day (BID) | ORAL | Status: DC | PRN

## 2014-12-09 MED ORDER — METHYLPREDNISOLONE ACETATE 80 MG/ML IJ SUSP
40.0000 mg | Freq: Once | INTRAMUSCULAR | Status: AC
  Administered 2014-12-09: 40 mg via INTRAMUSCULAR

## 2014-12-09 MED ORDER — MELOXICAM 15 MG PO TABS: 15.0000 mg | ORAL_TABLET | Freq: Every day | ORAL | Status: DC

## 2014-12-09 MED ORDER — KETOROLAC TROMETHAMINE 60 MG/2ML IM SOLN
60.0000 mg | Freq: Once | INTRAMUSCULAR | Status: AC
  Administered 2014-12-09: 60 mg via INTRAMUSCULAR

## 2014-12-09 MED ORDER — METHYLPREDNISOLONE ACETATE 40 MG/ML IJ SUSP: 40.0000 mg | Freq: Once | INTRAMUSCULAR | Status: DC

## 2014-12-09 NOTE — Addendum Note (Signed)
Addended by: Shelbie Ammons on: 12/09/2014 12:11 PM   Modules accepted: Orders

## 2014-12-09 NOTE — Patient Instructions (Signed)

## 2014-12-09 NOTE — Progress Notes (Signed)
   Subjective:    Patient ID: Charlotte Perez, female    DOB: Sep 03, 1960, 54 y.o.   MRN: [id number]  Pt presents to the office for chronic right hip that has gone on for the last 6 months. Pt states she has been going to [place]. Pt states she has had multiple "injections into her hip" and pt states she has been told she can't have any more "shots at this time". Pt states she has had a MRI of the hip that shows she has arthritis  of that hip. Pt states she has been told she needs a hip replacement. Pt has appt with Dr. Remo Lipps Case on Sept 21 to discuss a hip replacement.  Hip Pain  The incident occurred more than 1 week ago.      Review of Systems  Constitutional: Negative.   HENT: Negative.   Eyes: Negative.   Respiratory: Negative.  Negative for shortness of breath.   Cardiovascular: Negative.  Negative for palpitations.  Gastrointestinal: Negative.   Endocrine: Negative.   Genitourinary: Negative.   Musculoskeletal: Negative.   Neurological: Negative.  Negative for headaches.  Hematological: Negative.   Psychiatric/Behavioral: Negative.   All other systems reviewed and are negative.      Objective:   Physical Exam  Constitutional: She is oriented to person, place, and time. She appears well-developed and well-nourished. No distress.  HENT:  Head: Normocephalic and atraumatic.  Eyes: Pupils are equal, round, and reactive to light.  Neck: Normal range of motion. Neck supple. No thyromegaly present.  Cardiovascular: Normal rate, regular rhythm, normal heart sounds and intact distal pulses.   No murmur heard. Pulmonary/Chest: Effort normal and breath sounds normal. No respiratory distress. She has no wheezes.  Abdominal: Soft. Bowel sounds are normal. She exhibits no distension. There is no tenderness.  Musculoskeletal: Normal range of motion. She exhibits no edema or tenderness.  Limited ROM of right hip with rotation related to pain   Neurological: She is alert  and oriented to person, place, and time. She has normal reflexes. No cranial nerve deficit.  Skin: Skin is warm and dry.  Psychiatric: She has a normal mood and affect. Her behavior is normal. Judgment and thought content normal.  Vitals reviewed.     BP 118/77 mmHg  Pulse 71  Temp(Src) 97 F (36.1 C) (Oral)  Ht 5\' 1"  (1.549 m)  Wt 156 lb 9.6 oz (71.033 kg)  BMI 29.60 kg/m2     Assessment & Plan:  1. Arthritis of right hip -Rest -No other NSAID's while taking mobic -Keep appts with Ortho and follow up to discuss hip replacement -Low carb diet- Watch blood sugars levels  -RTO prn - meloxicam (MOBIC) 15 MG tablet; Take 1 tablet (15 mg total) by mouth daily.  Dispense: 30 tablet; Refill: 0 - ketorolac (TORADOL) injection 60 mg; Inject 2 mLs (60 mg total) into the muscle once. - methylPREDNISolone acetate (DEPO-MEDROL) injection 40 mg; Inject 1 mL (40 mg total) into the muscle once. - traMADol (ULTRAM) 50 MG tablet; Take 1-2 tablets (50-100 mg total) by mouth every 12 (twelve) hours as needed.  Dispense: 90 tablet; Refill: 0  Evelina Dun, FNP

## 2014-12-21 ENCOUNTER — Telehealth: Payer: Self-pay | Admitting: Family

## 2014-12-21 NOTE — Telephone Encounter (Signed)
FYI, patient wanted to let you know she has discontinued the Meloxicam & Tramadol because they caused numbness and tingling in her tongue.

## 2014-12-21 NOTE — Telephone Encounter (Signed)
That is fine to discontinue. If she needs something that she will need to come in and talk to [place] at her next availability. Caryl Pina, MD [facility] 12/21/2014, 3:17 PM

## 2014-12-21 NOTE — Telephone Encounter (Signed)
Patient stated she does not need anything at this time

## 2014-12-26 ENCOUNTER — Encounter: Payer: Self-pay | Admitting: Family

## 2014-12-26 ENCOUNTER — Ambulatory Visit (INDEPENDENT_AMBULATORY_CARE_PROVIDER_SITE_OTHER): Payer: Medicaid Other | Admitting: Family

## 2014-12-26 VITALS — BP 134/88 | HR 87 | Ht 61.0 in

## 2014-12-26 DIAGNOSIS — G43109 Migraine with aura, not intractable, without status migrainosus: Secondary | ICD-10-CM

## 2014-12-26 DIAGNOSIS — R0789 Other chest pain: Secondary | ICD-10-CM | POA: Diagnosis not present

## 2014-12-26 DIAGNOSIS — G43909 Migraine, unspecified, not intractable, without status migrainosus: Secondary | ICD-10-CM | POA: Insufficient documentation

## 2014-12-26 NOTE — Progress Notes (Signed)
Subjective:    Patient ID: Charlotte Perez, female    DOB: 17-Jan-1961, 54 y.o.   MRN: [id number]  Chest Pain  This is a new problem. The current episode started today. The onset quality is sudden. The problem occurs intermittently. The problem has been waxing and waning. The pain is present in the substernal region. The pain is at a severity of 9/10. The pain is moderate. The quality of the pain is described as heavy and pressure. The pain radiates to the left shoulder. Associated symptoms include back pain, malaise/fatigue, nausea and shortness of breath. Pertinent negatives include no cough, dizziness, fever, headaches, palpitations or vomiting. The pain is aggravated by nothing. She has tried acetaminophen, NSAIDs and rest for the symptoms. The treatment provided no relief.  Her past medical history is significant for anxiety/panic attacks, diabetes, hyperlipidemia and hypertension.  Her family medical history is significant for CAD, diabetes and heart disease.  Migraine  This is a new problem. The current episode started in the past 7 days (Last 6 days). The problem has been waxing and waning. The pain is located in the temporal, retro-orbital and left unilateral region. The pain quality is similar to prior headaches. The quality of the pain is described as aching and dull. The pain is at a severity of 10/10. The pain is moderate. Associated symptoms include back pain and nausea. Pertinent negatives include no coughing, dizziness, fever or vomiting. The symptoms are aggravated by activity. She has tried acetaminophen, darkened room, NSAIDs and oral narcotics for the symptoms. The treatment provided no relief. Her past medical history is significant for hypertension.      Review of Systems  Constitutional: Positive for malaise/fatigue. Negative for fever.  HENT: Negative.   Eyes: Negative.   Respiratory: Positive for shortness of breath. Negative for cough.   Cardiovascular: Positive for  chest pain. Negative for palpitations.  Gastrointestinal: Positive for nausea. Negative for vomiting.  Endocrine: Negative.   Genitourinary: Negative.   Musculoskeletal: Positive for back pain.  Neurological: Negative.  Negative for dizziness and headaches.  Hematological: Negative.   Psychiatric/Behavioral: Negative.   All other systems reviewed and are negative.      Objective:   Physical Exam  Constitutional: She is oriented to person, place, and time. She appears well-developed and well-nourished. No distress.  HENT:  Head: Normocephalic and atraumatic.  Eyes: Pupils are equal, round, and reactive to light.  Neck: Normal range of motion. Neck supple. No thyromegaly present.  Cardiovascular: Normal rate, regular rhythm, normal heart sounds and intact distal pulses.   No murmur heard. Pulmonary/Chest: Effort normal and breath sounds normal. No respiratory distress. She has no wheezes.  Abdominal: Soft. Bowel sounds are normal. She exhibits no distension. There is no tenderness.  Musculoskeletal: Normal range of motion. She exhibits no edema or tenderness.  Neurological: She is alert and oriented to person, place, and time. She has normal reflexes. No cranial nerve deficit.  Skin: Skin is warm and dry.  Psychiatric: She has a normal mood and affect. Her behavior is normal. Judgment and thought content normal.  Vitals reviewed.   BP 134/88 mmHg  Pulse 87  Ht 5\' 1"  (1.549 m)       Assessment & Plan:  1. Other chest pain - EKG 12-Lead  2. Migraine with aura and without status migrainosus, not intractable Rest Avoid loud noises, caffeine, bright lights, or stress Get plenty of sleep at night- 7-8 hours a night  Pt told to go to ED  because of new chest pressure radiating to left arm and migraine of 6 days. Pt told after discharged from ED needs follow up to be started on maintanence medication to prevent migraines   Evelina Dun, FNP

## 2014-12-26 NOTE — Patient Instructions (Addendum)
Chest Pain (Nonspecific) It is often hard to give a specific diagnosis for the cause of chest pain. There is always a chance that your pain could be related to something serious, such as a heart attack or a blood clot in the lungs. You need to follow up with your health care provider for further evaluation. CAUSES   Heartburn.  Pneumonia or bronchitis.  Anxiety or stress.  Inflammation around your heart (pericarditis) or lung (pleuritis or pleurisy).  A blood clot in the lung.  A collapsed lung (pneumothorax). It can develop suddenly on its own (spontaneous pneumothorax) or from trauma to the chest.  Shingles infection (herpes zoster virus). The chest wall is composed of bones, muscles, and cartilage. Any of these can be the source of the pain.  The bones can be bruised by injury.  The muscles or cartilage can be strained by coughing or overwork.  The cartilage can be affected by inflammation and become sore (costochondritis). DIAGNOSIS  Lab tests or other studies may be needed to find the cause of your pain. Your health care provider may have you take a test called an ambulatory electrocardiogram (ECG). An ECG records your heartbeat patterns over a 24-hour period. You may also have other tests, such as:  Transthoracic echocardiogram (TTE). During echocardiography, sound waves are used to evaluate how blood flows through your heart.  Transesophageal echocardiogram (TEE).  Cardiac monitoring. This allows your health care provider to monitor your heart rate and rhythm in real time.  Holter monitor. This is a portable device that records your heartbeat and can help diagnose heart arrhythmias. It allows your health care provider to track your heart activity for several days, if needed.  Stress tests by exercise or by giving medicine that makes the heart beat faster. TREATMENT   Treatment depends on what may be causing your chest pain. Treatment may include:  Acid blockers for  heartburn.  Anti-inflammatory medicine.  Pain medicine for inflammatory conditions.  Antibiotics if an infection is present.  You may be advised to change lifestyle habits. This includes stopping smoking and avoiding alcohol, caffeine, and chocolate.  You may be advised to keep your head raised (elevated) when sleeping. This reduces the chance of acid going backward from your stomach into your esophagus. Most of the time, nonspecific chest pain will improve within 2-3 days with rest and mild pain medicine.  HOME CARE INSTRUCTIONS   If antibiotics were prescribed, take them as directed. Finish them even if you start to feel better.  For the next few days, avoid physical activities that bring on chest pain. Continue physical activities as directed.  Do not use any tobacco products, including cigarettes, chewing tobacco, or electronic cigarettes.  Avoid drinking alcohol.  Only take medicine as directed by your health care provider.  Follow your health care provider's suggestions for further testing if your chest pain does not go away.  Keep any follow-up appointments you made. If you do not go to an appointment, you could develop lasting (chronic) problems with pain. If there is any problem keeping an appointment, call to reschedule. SEEK MEDICAL CARE IF:   Your chest pain does not go away, even after treatment.  You have a rash with blisters on your chest.  You have a fever. SEEK IMMEDIATE MEDICAL CARE IF:   You have increased chest pain or pain that spreads to your arm, neck, jaw, back, or abdomen.  You have shortness of breath.  You have an increasing cough, or you cough  up blood. °· You have severe back or abdominal pain. °· You feel nauseous or vomit. °· You have severe weakness. °· You faint. °· You have chills. °This is an emergency. Do not wait to see if the pain will go away. Get medical help at once. Call your local emergency services (911 in [place] Do not drive  yourself to the hospital. °MAKE SURE YOU:  °· Understand these instructions. °· Will watch your condition. °· Will get help right away if you are not doing well or get worse. °Document Released: 12/19/2004 Document Revised: 03/16/2013 Document Reviewed: 10/15/2007 °ExitCare® Patient Information ©2015 [employer]. This information is not intended to replace advice given to you by your health care provider. Make sure you discuss any questions you have with your health care provider. ° °Migraine Headache °A migraine headache is an intense, throbbing pain on one or both sides of your head. A migraine can last for 30 minutes to several hours. °CAUSES  °The exact cause of a migraine headache is not always known. However, a migraine may be caused when nerves in the brain become irritated and release chemicals that cause inflammation. This causes pain. °Certain things may also trigger migraines, such as: °· Alcohol. °· Smoking. °· Stress. °· Menstruation. °· Aged cheeses. °· Foods or drinks that contain nitrates, glutamate, aspartame, or tyramine. °· Lack of sleep. °· Chocolate. °· Caffeine. °· Hunger. °· Physical exertion. °· Fatigue. °· Medicines used to treat chest pain (nitroglycerine), birth control pills, estrogen, and some blood pressure medicines. °SIGNS AND SYMPTOMS °· Pain on one or both sides of your head. °· Pulsating or throbbing pain. °· Severe pain that prevents daily activities. °· Pain that is aggravated by any physical activity. °· Nausea, vomiting, or both. °· Dizziness. °· Pain with exposure to bright lights, loud noises, or activity. °· General sensitivity to bright lights, loud noises, or smells. °Before you get a migraine, you may get warning signs that a migraine is coming (aura). An aura may include: °· Seeing flashing lights. °· Seeing bright spots, halos, or zigzag lines. °· Having tunnel vision or blurred vision. °· Having feelings of numbness or tingling. °· Having trouble talking. °· Having  muscle weakness. °DIAGNOSIS  °A migraine headache is often diagnosed based on: °· Symptoms. °· Physical exam. °· A CT scan or MRI of your head. These imaging tests cannot diagnose migraines, but they can help rule out other causes of headaches. °TREATMENT °Medicines may be given for pain and nausea. Medicines can also be given to help prevent recurrent migraines.  °HOME CARE INSTRUCTIONS °· Only take over-the-counter or prescription medicines for pain or discomfort as directed by your health care provider. The use of long-term narcotics is not recommended. °· Lie down in a dark, quiet room when you have a migraine. °· Keep a journal to find out what may trigger your migraine headaches. For example, write down: °¨ What you eat and drink. °¨ How much sleep you get. °¨ Any change to your diet or medicines. °· Limit alcohol consumption. °· Quit smoking if you smoke. °· Get 7-9 hours of sleep, or as recommended by your health care provider. °· Limit stress. °· Keep lights dim if bright lights bother you and make your migraines worse. °SEEK IMMEDIATE MEDICAL CARE IF:  °· Your migraine becomes severe. °· You have a fever. °· You have a stiff neck. °· You have vision loss. °· You have muscular weakness or loss of muscle control. °· You start losing   your balance or have trouble walking. °· You feel faint or pass out. °· You have severe symptoms that are different from your first symptoms. °MAKE SURE YOU:  °· Understand these instructions. °· Will watch your condition. °· Will get help right away if you are not doing well or get worse. °Document Released: 03/11/2005 Document Revised: 07/26/2013 Document Reviewed: 11/16/2012 °ExitCare® Patient Information ©2015 [employer]. This information is not intended to replace advice given to you by your health care provider. Make sure you discuss any questions you have with your health care provider. ° °

## 2015-01-06 ENCOUNTER — Ambulatory Visit (INDEPENDENT_AMBULATORY_CARE_PROVIDER_SITE_OTHER): Payer: Medicaid Other

## 2015-01-06 DIAGNOSIS — Z23 Encounter for immunization: Secondary | ICD-10-CM | POA: Diagnosis not present

## 2015-01-26 ENCOUNTER — Telehealth (HOSPITAL_COMMUNITY): Payer: Self-pay | Admitting: *Deleted

## 2015-01-26 NOTE — Telephone Encounter (Signed)
lmtcb on pt home number. number provided. appt need to be resch appt due to provider being out of the office that morning of 01-27-15

## 2015-01-27 ENCOUNTER — Ambulatory Visit (HOSPITAL_COMMUNITY): Payer: Self-pay | Admitting: Psychiatry

## 2015-01-30 ENCOUNTER — Other Ambulatory Visit: Payer: Self-pay | Admitting: Family

## 2015-01-31 ENCOUNTER — Ambulatory Visit (HOSPITAL_COMMUNITY): Payer: Self-pay | Admitting: Psychiatry

## 2015-02-01 ENCOUNTER — Ambulatory Visit (HOSPITAL_COMMUNITY): Payer: Self-pay | Admitting: Psychiatry

## 2015-02-07 ENCOUNTER — Encounter (HOSPITAL_COMMUNITY): Payer: Self-pay | Admitting: Psychiatry

## 2015-02-07 ENCOUNTER — Ambulatory Visit (INDEPENDENT_AMBULATORY_CARE_PROVIDER_SITE_OTHER): Payer: Medicaid Other | Admitting: Psychiatry

## 2015-02-07 VITALS — BP 110/82 | Ht 61.0 in | Wt 153.0 lb

## 2015-02-07 DIAGNOSIS — F4310 Post-traumatic stress disorder, unspecified: Secondary | ICD-10-CM

## 2015-02-07 DIAGNOSIS — F900 Attention-deficit hyperactivity disorder, predominantly inattentive type: Secondary | ICD-10-CM | POA: Diagnosis not present

## 2015-02-07 DIAGNOSIS — F329 Major depressive disorder, single episode, unspecified: Secondary | ICD-10-CM | POA: Diagnosis not present

## 2015-02-07 DIAGNOSIS — F32A Depression, unspecified: Secondary | ICD-10-CM

## 2015-02-07 MED ORDER — ALPRAZOLAM 1 MG PO TABS: 1.0000 mg | ORAL_TABLET | Freq: Every evening | ORAL | Status: DC | PRN

## 2015-02-07 MED ORDER — VENLAFAXINE HCL ER 150 MG PO CP24: ORAL_CAPSULE | ORAL | Status: DC
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
## 2015-02-25 ENCOUNTER — Other Ambulatory Visit: Payer: Self-pay | Admitting: Family

## 2015-02-27 ENCOUNTER — Ambulatory Visit (INDEPENDENT_AMBULATORY_CARE_PROVIDER_SITE_OTHER): Payer: Medicaid Other | Admitting: Family

## 2015-02-27 ENCOUNTER — Encounter: Payer: Self-pay | Admitting: Family

## 2015-02-27 ENCOUNTER — Encounter (INDEPENDENT_AMBULATORY_CARE_PROVIDER_SITE_OTHER): Payer: Self-pay

## 2015-02-27 VITALS — BP 111/74 | HR 89 | Temp 97.4°F | Ht 61.0 in | Wt 155.2 lb

## 2015-02-27 DIAGNOSIS — E785 Hyperlipidemia, unspecified: Secondary | ICD-10-CM

## 2015-02-27 DIAGNOSIS — K219 Gastro-esophageal reflux disease without esophagitis: Secondary | ICD-10-CM

## 2015-02-27 DIAGNOSIS — E1165 Type 2 diabetes mellitus with hyperglycemia: Secondary | ICD-10-CM | POA: Diagnosis not present

## 2015-02-27 DIAGNOSIS — F329 Major depressive disorder, single episode, unspecified: Secondary | ICD-10-CM | POA: Diagnosis not present

## 2015-02-27 DIAGNOSIS — F411 Generalized anxiety disorder: Secondary | ICD-10-CM

## 2015-02-27 DIAGNOSIS — G43109 Migraine with aura, not intractable, without status migrainosus: Secondary | ICD-10-CM | POA: Diagnosis not present

## 2015-02-27 DIAGNOSIS — F32A Depression, unspecified: Secondary | ICD-10-CM

## 2015-02-27 LAB — POCT GLYCOSYLATED HEMOGLOBIN (HGB A1C): Hemoglobin A1C: 6.8

## 2015-02-27 NOTE — Patient Instructions (Signed)
Health Maintenance, Female Adopting a healthy lifestyle and getting preventive care can go a long way to promote health and wellness. Talk with your health care provider about what schedule of regular examinations is right for you. This is a good chance for you to check in with your provider about disease prevention and staying healthy. In between checkups, there are plenty of things you can do on your own. Experts have done a lot of research about which lifestyle changes and preventive measures are most likely to keep you healthy. Ask your health care provider for more information. WEIGHT AND DIET  Eat a healthy diet  Be sure to include plenty of vegetables, fruits, low-fat dairy products, and lean protein.  Do not eat a lot of foods high in solid fats, added sugars, or salt.  Get regular exercise. This is one of the most important things you can do for your health.  Most adults should exercise for at least 150 minutes each week. The exercise should increase your heart rate and make you sweat (moderate-intensity exercise).  Most adults should also do strengthening exercises at least twice a week. This is in addition to the moderate-intensity exercise.  Maintain a healthy weight  Body mass index (BMI) is a measurement that can be used to identify possible weight problems. It estimates body fat based on height and weight. Your health care provider can help determine your BMI and help you achieve or maintain a healthy weight.  For females 20 years of age and older:   A BMI below 18.5 is considered underweight.  A BMI of 18.5 to 24.9 is normal.  A BMI of 25 to 29.9 is considered overweight.  A BMI of 30 and above is considered obese.  Watch levels of cholesterol and blood lipids  You should start having your blood tested for lipids and cholesterol at 55 years of age, then have this test every 5 years.  You may need to have your cholesterol levels checked more often if:  Your lipid  or cholesterol levels are high.  You are older than 55 years of age.  You are at high risk for heart disease.  CANCER SCREENING   Lung Cancer  Lung cancer screening is recommended for adults 55-80 years old who are at high risk for lung cancer because of a history of smoking.  A yearly low-dose CT scan of the lungs is recommended for people who:  Currently smoke.  Have quit within the past 15 years.  Have at least a 30-pack-year history of smoking. A pack year is smoking an average of one pack of cigarettes a day for 1 year.  Yearly screening should continue until it has been 15 years since you quit.  Yearly screening should stop if you develop a health problem that would prevent you from having lung cancer treatment.  Breast Cancer  Practice breast self-awareness. This means understanding how your breasts normally appear and feel.  It also means doing regular breast self-exams. Let your health care provider know about any changes, no matter how small.  If you are in your 20s or 30s, you should have a clinical breast exam (CBE) by a health care provider every 1-3 years as part of a regular health exam.  If you are 40 or older, have a CBE every year. Also consider having a breast X-ray (mammogram) every year.  If you have a family history of breast cancer, talk to your health care provider about genetic screening.  If you   are at high risk for breast cancer, talk to your health care provider about having an MRI and a mammogram every year.  Breast cancer gene (BRCA) assessment is recommended for women who have family members with BRCA-related cancers. BRCA-related cancers include:  Breast.  Ovarian.  Tubal.  Peritoneal cancers.  Results of the assessment will determine the need for genetic counseling and BRCA1 and BRCA2 testing. Cervical Cancer Your health care provider may recommend that you be screened regularly for cancer of the pelvic organs (ovaries, uterus, and  vagina). This screening involves a pelvic examination, including checking for microscopic changes to the surface of your cervix (Pap test). You may be encouraged to have this screening done every 3 years, beginning at age 21.  For women ages 30-65, health care providers may recommend pelvic exams and Pap testing every 3 years, or they may recommend the Pap and pelvic exam, combined with testing for human papilloma virus (HPV), every 5 years. Some types of HPV increase your risk of cervical cancer. Testing for HPV may also be done on women of any age with unclear Pap test results.  Other health care providers may not recommend any screening for nonpregnant women who are considered low risk for pelvic cancer and who do not have symptoms. Ask your health care provider if a screening pelvic exam is right for you.  If you have had past treatment for cervical cancer or a condition that could lead to cancer, you need Pap tests and screening for cancer for at least 20 years after your treatment. If Pap tests have been discontinued, your risk factors (such as having a new sexual partner) need to be reassessed to determine if screening should resume. Some women have medical problems that increase the chance of getting cervical cancer. In these cases, your health care provider may recommend more frequent screening and Pap tests. Colorectal Cancer  This type of cancer can be detected and often prevented.  Routine colorectal cancer screening usually begins at 55 years of age and continues through 55 years of age.  Your health care provider may recommend screening at an earlier age if you have risk factors for colon cancer.  Your health care provider may also recommend using home test kits to check for hidden blood in the stool.  A small camera at the end of a tube can be used to examine your colon directly (sigmoidoscopy or colonoscopy). This is done to check for the earliest forms of colorectal  cancer.  Routine screening usually begins at age 50.  Direct examination of the colon should be repeated every 5-10 years through 55 years of age. However, you may need to be screened more often if early forms of precancerous polyps or small growths are found. Skin Cancer  Check your skin from head to toe regularly.  Tell your health care provider about any new moles or changes in moles, especially if there is a change in a mole's shape or color.  Also tell your health care provider if you have a mole that is larger than the size of a pencil eraser.  Always use sunscreen. Apply sunscreen liberally and repeatedly throughout the day.  Protect yourself by wearing long sleeves, pants, a wide-brimmed hat, and sunglasses whenever you are outside. HEART DISEASE, DIABETES, AND HIGH BLOOD PRESSURE   High blood pressure causes heart disease and increases the risk of stroke. High blood pressure is more likely to develop in:  People who have blood pressure in the high end   of the normal range (130-139/85-89 mm Hg).  People who are overweight or obese.  People who are African American.  If you are 38-23 years of age, have your blood pressure checked every 3-5 years. If you are 61 years of age or older, have your blood pressure checked every year. You should have your blood pressure measured twice--once when you are at a hospital or clinic, and once when you are not at a hospital or clinic. Record the average of the two measurements. To check your blood pressure when you are not at a hospital or clinic, you can use:  An automated blood pressure machine at a pharmacy.  A home blood pressure monitor.  If you are between 45 years and 39 years old, ask your health care provider if you should take aspirin to prevent strokes.  Have regular diabetes screenings. This involves taking a blood sample to check your fasting blood sugar level.  If you are at a normal weight and have a low risk for diabetes,  have this test once every three years after 55 years of age.  If you are overweight and have a high risk for diabetes, consider being tested at a younger age or more often. PREVENTING INFECTION  Hepatitis B  If you have a higher risk for hepatitis B, you should be screened for this virus. You are considered at high risk for hepatitis B if:  You were born in a country where hepatitis B is common. Ask your health care provider which countries are considered high risk.  Your parents were born in a high-risk country, and you have not been immunized against hepatitis B (hepatitis B vaccine).  You have HIV or AIDS.  You use needles to inject street drugs.  You live with someone who has hepatitis B.  You have had sex with someone who has hepatitis B.  You get hemodialysis treatment.  You take certain medicines for conditions, including cancer, organ transplantation, and autoimmune conditions. Hepatitis C  Blood testing is recommended for:  Everyone born from [birth year] through 1966.  Anyone with known risk factors for hepatitis C. Sexually transmitted infections (STIs)  You should be screened for sexually transmitted infections (STIs) including gonorrhea and chlamydia if:  You are sexually active and are younger than 55 years of age.  You are older than 55 years of age and your health care provider tells you that you are at risk for this type of infection.  Your sexual activity has changed since you were last screened and you are at an increased risk for chlamydia or gonorrhea. Ask your health care provider if you are at risk.  If you do not have HIV, but are at risk, it may be recommended that you take a prescription medicine daily to prevent HIV infection. This is called pre-exposure prophylaxis (PrEP). You are considered at risk if:  You are sexually active and do not regularly use condoms or know the HIV status of your partner(s).  You take drugs by injection.  You are sexually  active with a partner who has HIV. Talk with your health care provider about whether you are at high risk of being infected with HIV. If you choose to begin PrEP, you should first be tested for HIV. You should then be tested every 3 months for as long as you are taking PrEP.  PREGNANCY   If you are premenopausal and you may become pregnant, ask your health care provider about preconception counseling.  If you may  become pregnant, take 400 to 800 micrograms (mcg) of folic acid every day.  If you want to prevent pregnancy, talk to your health care provider about birth control (contraception). OSTEOPOROSIS AND MENOPAUSE   Osteoporosis is a disease in which the bones lose minerals and strength with aging. This can result in serious bone fractures. Your risk for osteoporosis can be identified using a bone density scan.  If you are 61 years of age or older, or if you are at risk for osteoporosis and fractures, ask your health care provider if you should be screened.  Ask your health care provider whether you should take a calcium or vitamin D supplement to lower your risk for osteoporosis.  Menopause may have certain physical symptoms and risks.  Hormone replacement therapy may reduce some of these symptoms and risks. Talk to your health care provider about whether hormone replacement therapy is right for you.  HOME CARE INSTRUCTIONS   Schedule regular health, dental, and eye exams.  Stay current with your immunizations.   Do not use any tobacco products including cigarettes, chewing tobacco, or electronic cigarettes.  If you are pregnant, do not drink alcohol.  If you are breastfeeding, limit how much and how often you drink alcohol.  Limit alcohol intake to no more than 1 drink per day for nonpregnant women. One drink equals 12 ounces of beer, 5 ounces of wine, or 1 ounces of hard liquor.  Do not use street drugs.  Do not share needles.  Ask your health care provider for help if  you need support or information about quitting drugs.  Tell your health care provider if you often feel depressed.  Tell your health care provider if you have ever been abused or do not feel safe at home.   This information is not intended to replace advice given to you by your health care provider. Make sure you discuss any questions you have with your health care provider.   Document Released: 09/24/2010 Document Revised: 04/01/2014 Document Reviewed: 02/10/2013 Elsevier Interactive Patient Education [employer].

## 2015-02-27 NOTE — Progress Notes (Signed)
Subjective:    Patient ID: Charlotte Perez, female    DOB: 1960/07/26, 54 y.o.   MRN: [id number]  PT presents to the office today for chronic follow up. Diabetes She presents for her follow-up diabetic visit. She has type 2 diabetes mellitus. Her disease course has been fluctuating. Pertinent negatives for hypoglycemia include no confusion, headaches, mood changes, nervousness/anxiousness, pallor or sleepiness. Associated symptoms include foot paresthesias. Pertinent negatives for diabetes include no blurred vision, no chest pain, no foot ulcerations and no visual change. Pertinent negatives for hypoglycemia complications include no blackouts and no hospitalization. Symptoms are worsening. Diabetic complications include peripheral neuropathy. Pertinent negatives for diabetic complications include no CVA, heart disease or nephropathy. Risk factors for coronary artery disease include diabetes mellitus, dyslipidemia, hypertension, stress, sedentary lifestyle and family history. Current diabetic treatment includes oral agent (triple therapy). She is compliant with treatment all of the time. She is following a generally healthy diet. Her breakfast blood glucose range is generally 180-200 mg/dl. An ACE inhibitor/angiotensin II receptor blocker is being taken. Eye exam is current.  Hyperlipidemia This is a chronic problem. The current episode started more than 1 year ago. The problem is controlled. Recent lipid tests were reviewed and are normal. Exacerbating diseases include diabetes. Pertinent negatives include no chest pain, myalgias or shortness of breath. Current antihyperlipidemic treatment includes statins. The current treatment provides significant improvement of lipids. Risk factors for coronary artery disease include diabetes mellitus, dyslipidemia, family history, hypertension and post-menopausal.  Anxiety Presents for follow-up visit. Onset was 1 to 6 months ago. The problem has been waxing and  waning. Symptoms include depressed mood and excessive worry. Patient reports no chest pain, confusion, irritability, nervous/anxious behavior, palpitations, panic, restlessness, shortness of breath or suicidal ideas. Symptoms occur occasionally. The severity of symptoms is mild.   Her past medical history is significant for anxiety/panic attacks and depression. Past treatments include benzodiazephines and non-SSRI antidepressants. The treatment provided moderate relief. Compliance with prior treatments has been good.  Gastroesophageal Reflux She reports no belching, no chest pain, no coughing or no heartburn. This is a chronic problem. The current episode started more than 1 year ago. The problem occurs rarely. The problem has been waxing and waning. The symptoms are aggravated by certain foods. She has tried a PPI for the symptoms. The treatment provided significant relief.  Depression      The patient presents with depression.  This is a chronic (Pt see's DR. Rudean Haskell at behavioral health ) problem.  The current episode started more than 1 year ago.   The onset quality is gradual.   The problem occurs rarely.  The problem has been waxing and waning since onset.  Associated symptoms include no helplessness, no hopelessness, no restlessness, no myalgias, no headaches, not sad and no suicidal ideas.  Past treatments include SNRIs - Serotonin and norepinephrine reuptake inhibitors.  Compliance with treatment is good.  Past medical history includes anxiety and depression.   Migraine  This is a chronic problem. The current episode started more than 1 year ago. Episode frequency: every 4-5 months. The problem has been waxing and waning. The pain is located in the bilateral and occipital region. The pain is at a severity of 7/10. The pain is moderate. Pertinent negatives include no blurred vision, coughing or visual change. She has tried acetaminophen and ketorolac injections for the symptoms. The treatment  provided moderate relief. Her past medical history is significant for migraine headaches.      Review  of Systems  Constitutional: Negative.  Negative for irritability.  HENT: Negative.   Eyes: Negative.  Negative for blurred vision.  Respiratory: Negative.  Negative for cough and shortness of breath.   Cardiovascular: Negative.  Negative for chest pain and palpitations.  Gastrointestinal: Negative.  Negative for heartburn.  Endocrine: Negative.   Genitourinary: Negative.   Musculoskeletal: Negative.  Negative for myalgias.  Skin: Negative for pallor.  Neurological: Negative.  Negative for headaches.  Hematological: Negative.   Psychiatric/Behavioral: Positive for depression. Negative for suicidal ideas and confusion. The patient is not nervous/anxious.   All other systems reviewed and are negative.      Objective:   Physical Exam  Constitutional: She is oriented to person, place, and time. She appears well-developed and well-nourished. No distress.  HENT:  Head: Normocephalic and atraumatic.  Right Ear: External ear normal.  Left Ear: External ear normal.  Nose: Nose normal.  Mouth/Throat: Oropharynx is clear and moist.  Eyes: Pupils are equal, round, and reactive to light.  Neck: Normal range of motion. Neck supple. No thyromegaly present.  Cardiovascular: Normal rate, regular rhythm, normal heart sounds and intact distal pulses.   No murmur heard. Pulmonary/Chest: Effort normal and breath sounds normal. No respiratory distress. She has no wheezes.  Abdominal: Soft. Bowel sounds are normal. She exhibits no distension. There is no tenderness.  Musculoskeletal: Normal range of motion. She exhibits no edema or tenderness.  Neurological: She is alert and oriented to person, place, and time. She has normal reflexes. No cranial nerve deficit.  Skin: Skin is warm and dry.  Psychiatric: She has a normal mood and affect. Her behavior is normal. Judgment and thought content normal.    Vitals reviewed.  See Diabetic foot note   BP 111/74 mmHg  Pulse 89  Temp(Src) 97.4 F (36.3 C) (Oral)  Ht _0  (1.549 m)  Wt 155 lb 3.2 oz (70.398 kg)  BMI 29.34 kg/m2     Assessment & Plan:  1. Migraine with aura and without status migrainosus, not intractable - CMP14+EGFR  2. Gastroesophageal reflux disease without esophagitis - CMP14+EGFR  3. Type 2 diabetes mellitus with hyperglycemia, without long-term current use of insulin (HCC) - POCT glycosylated hemoglobin (Hb A1C) - CMP14+EGFR  4. Hyperlipidemia - CMP14+EGFR - Lipid panel  5. GAD (generalized anxiety disorder) - CMP14+EGFR  6. Depression - CMP14+EGFR   Continue all meds Labs pending Health Maintenance reviewed Diet and exercise encouraged RTO 3 months  Evelina Dun, FNP

## 2015-02-28 LAB — CMP14+EGFR
A/G RATIO: 1.7 (ref 1.1–2.5)
ALK PHOS: 56 IU/L (ref 39–117)
ALT: 12 IU/L (ref 0–32)
AST: 15 IU/L (ref 0–40)
Albumin: 4 g/dL (ref 3.5–5.5)
BUN/Creatinine Ratio: 25 — ABNORMAL HIGH (ref 9–23)
BUN: 15 mg/dL (ref 6–24)
Bilirubin Total: <0.2 mg/dL (ref 0.0–1.2)
CALCIUM: 9.4 mg/dL (ref 8.7–10.2)
CO2: 25 mmol/L (ref 18–29)
Chloride: 99 mmol/L (ref 97–106)
Creatinine, Ser: 0.6 mg/dL (ref 0.57–1.00)
GFR calc Af Amer: 119 mL/min/{1.73_m2} (ref >59)
GFR, EST NON AFRICAN AMERICAN: 103 mL/min/{1.73_m2} (ref >59)
GLOBULIN, TOTAL: 2.4 g/dL (ref 1.5–4.5)
Glucose: 80 mg/dL (ref 65–99)
POTASSIUM: 4.8 mmol/L (ref 3.5–5.2)
SODIUM: 139 mmol/L (ref 136–144)
Total Protein: 6.4 g/dL (ref 6.0–8.5)

## 2015-02-28 LAB — LIPID PANEL
Chol/HDL Ratio: 2 ratio units (ref 0.0–4.4)
Cholesterol, Total: 167 mg/dL (ref 100–199)
HDL: 82 mg/dL (ref >39)
LDL Calculated: 59 mg/dL (ref 0–99)
TRIGLYCERIDES: 129 mg/dL (ref 0–149)
VLDL Cholesterol Cal: 26 mg/dL (ref 5–40)

## 2015-03-01 ENCOUNTER — Telehealth: Payer: Self-pay | Admitting: Family

## 2015-03-02 NOTE — Telephone Encounter (Signed)
Explained to patient that we do not have forms, she will call them and get them to resend.

## 2015-03-09 ENCOUNTER — Ambulatory Visit (HOSPITAL_COMMUNITY): Payer: Self-pay | Admitting: Psychiatry

## 2015-03-09 ENCOUNTER — Telehealth: Payer: Self-pay | Admitting: Family

## 2015-03-16 ENCOUNTER — Encounter: Payer: Self-pay | Admitting: Gastroenterology

## 2015-03-17 NOTE — Telephone Encounter (Signed)
Forms faxed back 03/16/15

## 2015-03-22 ENCOUNTER — Other Ambulatory Visit: Payer: Self-pay | Admitting: Family

## 2015-04-21 ENCOUNTER — Other Ambulatory Visit (HOSPITAL_COMMUNITY): Payer: Self-pay | Admitting: Psychiatry

## 2015-05-03 LAB — HM DIABETES EYE EXAM

## 2015-05-10 ENCOUNTER — Encounter (HOSPITAL_COMMUNITY): Payer: Self-pay | Admitting: Psychiatry

## 2015-05-10 ENCOUNTER — Ambulatory Visit (HOSPITAL_COMMUNITY): Payer: Self-pay | Admitting: Psychiatry

## 2015-05-15 ENCOUNTER — Ambulatory Visit (INDEPENDENT_AMBULATORY_CARE_PROVIDER_SITE_OTHER): Payer: Medicaid Other

## 2015-05-15 ENCOUNTER — Encounter: Payer: Self-pay | Admitting: Family

## 2015-05-15 ENCOUNTER — Ambulatory Visit (INDEPENDENT_AMBULATORY_CARE_PROVIDER_SITE_OTHER): Payer: Medicaid Other | Admitting: Family

## 2015-05-15 ENCOUNTER — Ambulatory Visit: Payer: Medicaid Other | Admitting: Family

## 2015-05-15 VITALS — BP 112/71 | HR 95 | Temp 97.7°F | Ht 61.0 in | Wt 156.4 lb

## 2015-05-15 DIAGNOSIS — M25551 Pain in right hip: Secondary | ICD-10-CM

## 2015-05-15 DIAGNOSIS — M5441 Lumbago with sciatica, right side: Secondary | ICD-10-CM

## 2015-05-15 LAB — POCT URINALYSIS DIPSTICK
Bilirubin, UA: NEGATIVE
Glucose, UA: NEGATIVE
KETONES UA: NEGATIVE
Leukocytes, UA: NEGATIVE
Nitrite, UA: NEGATIVE
PH UA: 7.5
PROTEIN UA: NEGATIVE
RBC UA: NEGATIVE
SPEC GRAV UA: 1.01
UROBILINOGEN UA: NEGATIVE

## 2015-05-15 LAB — POCT UA - MICROSCOPIC ONLY
CASTS, UR, LPF, POC: NEGATIVE
Crystals, Ur, HPF, POC: NEGATIVE
Mucus, UA: NEGATIVE
YEAST UA: NEGATIVE

## 2015-05-15 MED ORDER — KETOROLAC TROMETHAMINE 60 MG/2ML IM SOLN
60.0000 mg | Freq: Once | INTRAMUSCULAR | Status: AC
  Administered 2015-05-15: 60 mg via INTRAMUSCULAR

## 2015-05-15 MED ORDER — CYCLOBENZAPRINE HCL 10 MG PO TABS: 10.0000 mg | ORAL_TABLET | Freq: Three times a day (TID) | ORAL | Status: DC | PRN

## 2015-05-15 MED ORDER — METHYLPREDNISOLONE ACETATE 80 MG/ML IJ SUSP
80.0000 mg | Freq: Once | INTRAMUSCULAR | Status: AC
  Administered 2015-05-15: 80 mg via INTRAMUSCULAR

## 2015-05-15 NOTE — Progress Notes (Signed)
   Subjective:    Patient ID: Charlotte Perez, female    DOB: 1961-01-15, 55 y.o.   MRN: [id number]  Back Pain This is a new problem. The current episode started in the past 7 days. The problem occurs constantly. The problem is unchanged. The pain is present in the gluteal and lumbar spine. The quality of the pain is described as aching. The pain radiates to the right thigh and right foot. The pain is at a severity of 10/10. The pain is moderate. The symptoms are aggravated by bending. Associated symptoms include leg pain and numbness. Pertinent negatives include no bladder incontinence, bowel incontinence, dysuria, headaches or tingling. Risk factors include obesity. She has tried NSAIDs for the symptoms. The treatment provided mild relief.      Review of Systems  Constitutional: Negative.   HENT: Negative.   Eyes: Negative.   Respiratory: Negative.  Negative for shortness of breath.   Cardiovascular: Negative.  Negative for palpitations.  Gastrointestinal: Negative.  Negative for bowel incontinence.  Endocrine: Negative.   Genitourinary: Negative.  Negative for bladder incontinence and dysuria.  Musculoskeletal: Positive for back pain.  Neurological: Positive for numbness. Negative for tingling and headaches.  Hematological: Negative.   Psychiatric/Behavioral: Negative.   All other systems reviewed and are negative.      Objective:   Physical Exam  Constitutional: She is oriented to person, place, and time. She appears well-developed and well-nourished. No distress.  HENT:  Head: Normocephalic and atraumatic.  Eyes: Pupils are equal, round, and reactive to light.  Neck: Normal range of motion. Neck supple. No thyromegaly present.  Cardiovascular: Normal rate, regular rhythm, normal heart sounds and intact distal pulses.   No murmur heard. Pulmonary/Chest: Effort normal and breath sounds normal. No respiratory distress. She has no wheezes.  Abdominal: Soft. Bowel sounds are  normal. She exhibits no distension. There is no tenderness.  Musculoskeletal: Normal range of motion. She exhibits no edema or tenderness.  Neurological: She is alert and oriented to person, place, and time. She has normal reflexes. No cranial nerve deficit.  Skin: Skin is warm and dry.  Psychiatric: She has a normal mood and affect. Her behavior is normal. Judgment and thought content normal.  Vitals reviewed.   X-ray- WNL Preliminary reading by Evelina Dun, FNP [facility]   Ht 5\' 1"  (1.549 m)  Wt 156 lb 6.4 oz (70.943 kg)  BMI 29.57 kg/m2     Assessment & Plan:  1. Right hip pain - DG HIP UNILAT W OR W/O PELVIS 2-3 VIEWS RIGHT; Future  2. Right-sided low back pain with right-sided sciatica -Rest -Ice or heat as needed -ROM exercises discussed -Sedation precaution discussed -Pt can continue mobic as needed -Low carb diet since she is DM- It is well controlled - cyclobenzaprine (FLEXERIL) 10 MG tablet; Take 1 tablet (10 mg total) by mouth 3 (three) times daily as needed for muscle spasms.  Dispense: 30 tablet; Refill: 0 - POCT urinalysis dipstick - POCT UA - Microscopic Only - ketorolac (TORADOL) injection 60 mg; Inject 2 mLs (60 mg total) into the muscle once. - methylPREDNISolone acetate (DEPO-MEDROL) injection 80 mg; Inject 1 mL (80 mg total) into the muscle once.   Evelina Dun, FNP

## 2015-05-15 NOTE — Patient Instructions (Signed)
Sciatica With Rehab The sciatic nerve runs from the back down the leg and is responsible for sensation and control of the muscles in the back (posterior) side of the thigh, lower leg, and foot. Sciatica is a condition that is characterized by inflammation of this nerve.  SYMPTOMS   Signs of nerve damage, including numbness and/or weakness along the posterior side of the lower extremity.  Pain in the back of the thigh that may also travel down the leg.  Pain that worsens when sitting for long periods of time.  Occasionally, pain in the back or buttock. CAUSES  Inflammation of the sciatic nerve is the cause of sciatica. The inflammation is due to something irritating the nerve. Common sources of irritation include:  Sitting for long periods of time.  Direct trauma to the nerve.  Arthritis of the spine.  Herniated or ruptured disk.  Slipping of the vertebrae (spondylolisthesis).  Pressure from soft tissues, such as muscles or ligament-like tissue (fascia). RISK INCREASES WITH:  Sports that place pressure or stress on the spine (football or weightlifting).  Poor strength and flexibility.  Failure to warm up properly before activity.  Family history of low back pain or disk disorders.  Previous back injury or surgery.  Poor body mechanics, especially when lifting, or poor posture. PREVENTION   Warm up and stretch properly before activity.  Maintain physical fitness:  Strength, flexibility, and endurance.  Cardiovascular fitness.  Learn and use proper technique, especially with posture and lifting. When possible, have coach correct improper technique.  Avoid activities that place stress on the spine. PROGNOSIS If treated properly, then sciatica usually resolves within 6 weeks. However, occasionally surgery is necessary.  RELATED COMPLICATIONS   Permanent nerve damage, including pain, numbness, tingle, or weakness.  Chronic back pain.  Risks of surgery: infection,  bleeding, nerve damage, or damage to surrounding tissues. TREATMENT Treatment initially involves resting from any activities that aggravate your symptoms. The use of ice and medication may help reduce pain and inflammation. The use of strengthening and stretching exercises may help reduce pain with activity. These exercises may be performed at home or with referral to a therapist. A therapist may recommend further treatments, such as transcutaneous electronic nerve stimulation (TENS) or ultrasound. Your caregiver may recommend corticosteroid injections to help reduce inflammation of the sciatic nerve. If symptoms persist despite non-surgical (conservative) treatment, then surgery may be recommended. MEDICATION  If pain medication is necessary, then nonsteroidal anti-inflammatory medications, such as aspirin and ibuprofen, or other minor pain relievers, such as acetaminophen, are often recommended.  Do not take pain medication for 7 days before surgery.  Prescription pain relievers may be given if deemed necessary by your caregiver. Use only as directed and only as much as you need.  Ointments applied to the skin may be helpful.  Corticosteroid injections may be given by your caregiver. These injections should be reserved for the most serious cases, because they may only be given a certain number of times. HEAT AND COLD  Cold treatment (icing) relieves pain and reduces inflammation. Cold treatment should be applied for 10 to 15 minutes every 2 to 3 hours for inflammation and pain and immediately after any activity that aggravates your symptoms. Use ice packs or massage the area with a piece of ice (ice massage).  Heat treatment may be used prior to performing the stretching and strengthening activities prescribed by your caregiver, physical therapist, or athletic trainer. Use a heat pack or soak the injury in warm water.   SEEK MEDICAL CARE IF:  Treatment seems to offer no benefit, or the condition  worsens.  Any medications produce adverse side effects. EXERCISES  RANGE OF MOTION (ROM) AND STRETCHING EXERCISES - Sciatica Most people with sciatic will find that their symptoms worsen with either excessive bending forward (flexion) or arching at the low back (extension). The exercises which will help resolve your symptoms will focus on the opposite motion. Your physician, physical therapist or athletic trainer will help you determine which exercises will be most helpful to resolve your low back pain. Do not complete any exercises without first consulting with your clinician. Discontinue any exercises which worsen your symptoms until you speak to your clinician. If you have pain, numbness or tingling which travels down into your buttocks, leg or foot, the goal of the therapy is for these symptoms to move closer to your back and eventually resolve. Occasionally, these leg symptoms will get better, but your low back pain may worsen; this is typically an indication of progress in your rehabilitation. Be certain to be very alert to any changes in your symptoms and the activities in which you participated in the 24 hours prior to the change. Sharing this information with your clinician will allow him/her to most efficiently treat your condition. These exercises may help you when beginning to rehabilitate your injury. Your symptoms may resolve with or without further involvement from your physician, physical therapist or athletic trainer. While completing these exercises, remember:   Restoring tissue flexibility helps normal motion to return to the joints. This allows healthier, less painful movement and activity.  An effective stretch should be held for at least 30 seconds.  A stretch should never be painful. You should only feel a gentle lengthening or release in the stretched tissue. FLEXION RANGE OF MOTION AND STRETCHING EXERCISES: STRETCH - Flexion, Single Knee to Chest   Lie on a firm bed or floor  with both legs extended in front of you.  Keeping one leg in contact with the floor, bring your opposite knee to your chest. Hold your leg in place by either grabbing behind your thigh or at your knee.  Pull until you feel a gentle stretch in your low back. Hold __________ seconds.  Slowly release your grasp and repeat the exercise with the opposite side. Repeat __________ times. Complete this exercise __________ times per day.  STRETCH - Flexion, Double Knee to Chest  Lie on a firm bed or floor with both legs extended in front of you.  Keeping one leg in contact with the floor, bring your opposite knee to your chest.  Tense your stomach muscles to support your back and then lift your other knee to your chest. Hold your legs in place by either grabbing behind your thighs or at your knees.  Pull both knees toward your chest until you feel a gentle stretch in your low back. Hold __________ seconds.  Tense your stomach muscles and slowly return one leg at a time to the floor. Repeat __________ times. Complete this exercise __________ times per day.  STRETCH - Low Trunk Rotation   Lie on a firm bed or floor. Keeping your legs in front of you, bend your knees so they are both pointed toward the ceiling and your feet are flat on the floor.  Extend your arms out to the side. This will stabilize your upper body by keeping your shoulders in contact with the floor.  Gently and slowly drop both knees together to one side until   you feel a gentle stretch in your low back. Hold for __________ seconds.  Tense your stomach muscles to support your low back as you bring your knees back to the starting position. Repeat the exercise to the other side. Repeat __________ times. Complete this exercise __________ times per day  EXTENSION RANGE OF MOTION AND FLEXIBILITY EXERCISES: STRETCH - Extension, Prone on Elbows  Lie on your stomach on the floor, a bed will be too soft. Place your palms about shoulder  width apart and at the height of your head.  Place your elbows under your shoulders. If this is too painful, stack pillows under your chest.  Allow your body to relax so that your hips drop lower and make contact more completely with the floor.  Hold this position for __________ seconds.  Slowly return to lying flat on the floor. Repeat __________ times. Complete this exercise __________ times per day.  RANGE OF MOTION - Extension, Prone Press Ups  Lie on your stomach on the floor, a bed will be too soft. Place your palms about shoulder width apart and at the height of your head.  Keeping your back as relaxed as possible, slowly straighten your elbows while keeping your hips on the floor. You may adjust the placement of your hands to maximize your comfort. As you gain motion, your hands will come more underneath your shoulders.  Hold this position __________ seconds.  Slowly return to lying flat on the floor. Repeat __________ times. Complete this exercise __________ times per day.  STRENGTHENING EXERCISES - Sciatica  These exercises may help you when beginning to rehabilitate your injury. These exercises should be done near your "sweet spot." This is the neutral, low-back arch, somewhere between fully rounded and fully arched, that is your least painful position. When performed in this safe range of motion, these exercises can be used for people who have either a flexion or extension based injury. These exercises may resolve your symptoms with or without further involvement from your physician, physical therapist or athletic trainer. While completing these exercises, remember:   Muscles can gain both the endurance and the strength needed for everyday activities through controlled exercises.  Complete these exercises as instructed by your physician, physical therapist or athletic trainer. Progress with the resistance and repetition exercises only as your caregiver advises.  You may  experience muscle soreness or fatigue, but the pain or discomfort you are trying to eliminate should never worsen during these exercises. If this pain does worsen, stop and make certain you are following the directions exactly. If the pain is still present after adjustments, discontinue the exercise until you can discuss the trouble with your clinician. STRENGTHENING - Deep Abdominals, Pelvic Tilt   Lie on a firm bed or floor. Keeping your legs in front of you, bend your knees so they are both pointed toward the ceiling and your feet are flat on the floor.  Tense your lower abdominal muscles to press your low back into the floor. This motion will rotate your pelvis so that your tail bone is scooping upwards rather than pointing at your feet or into the floor.  With a gentle tension and even breathing, hold this position for __________ seconds. Repeat __________ times. Complete this exercise __________ times per day.  STRENGTHENING - Abdominals, Crunches   Lie on a firm bed or floor. Keeping your legs in front of you, bend your knees so they are both pointed toward the ceiling and your feet are flat on the   floor. Cross your arms over your chest.  Slightly tip your chin down without bending your neck.  Tense your abdominals and slowly lift your trunk high enough to just clear your shoulder blades. Lifting higher can put excessive stress on the low back and does not further strengthen your abdominal muscles.  Control your return to the starting position. Repeat __________ times. Complete this exercise __________ times per day.  STRENGTHENING - Quadruped, Opposite UE/LE Lift  Assume a hands and knees position on a firm surface. Keep your hands under your shoulders and your knees under your hips. You may place padding under your knees for comfort.  Find your neutral spine and gently tense your abdominal muscles so that you can maintain this position. Your shoulders and hips should form a rectangle  that is parallel with the floor and is not twisted.  Keeping your trunk steady, lift your right hand no higher than your shoulder and then your left leg no higher than your hip. Make sure you are not holding your breath. Hold this position __________ seconds.  Continuing to keep your abdominal muscles tense and your back steady, slowly return to your starting position. Repeat with the opposite arm and leg. Repeat __________ times. Complete this exercise __________ times per day.  STRENGTHENING - Abdominals and Quadriceps, Straight Leg Raise   Lie on a firm bed or floor with both legs extended in front of you.  Keeping one leg in contact with the floor, bend the other knee so that your foot can rest flat on the floor.  Find your neutral spine, and tense your abdominal muscles to maintain your spinal position throughout the exercise.  Slowly lift your straight leg off the floor about 6 inches for a count of 15, making sure to not hold your breath.  Still keeping your neutral spine, slowly lower your leg all the way to the floor. Repeat this exercise with each leg __________ times. Complete this exercise __________ times per day. POSTURE AND BODY MECHANICS CONSIDERATIONS - Sciatica Keeping correct posture when sitting, standing or completing your activities will reduce the stress put on different body tissues, allowing injured tissues a chance to heal and limiting painful experiences. The following are general guidelines for improved posture. Your physician or physical therapist will provide you with any instructions specific to your needs. While reading these guidelines, remember:  The exercises prescribed by your provider will help you have the flexibility and strength to maintain correct postures.  The correct posture provides the optimal environment for your joints to work. All of your joints have less wear and tear when properly supported by a spine with good posture. This means you will  experience a healthier, less painful body.  Correct posture must be practiced with all of your activities, especially prolonged sitting and standing. Correct posture is as important when doing repetitive low-stress activities (typing) as it is when doing a single heavy-load activity (lifting). RESTING POSITIONS Consider which positions are most painful for you when choosing a resting position. If you have pain with flexion-based activities (sitting, bending, stooping, squatting), choose a position that allows you to rest in a less flexed posture. You would want to avoid curling into a fetal position on your side. If your pain worsens with extension-based activities (prolonged standing, working overhead), avoid resting in an extended position such as sleeping on your stomach. Most people will find more comfort when they rest with their spine in a more neutral position, neither too rounded nor too   arched. Lying on a non-sagging bed on your side with a pillow between your knees, or on your back with a pillow under your knees will often provide some relief. Keep in mind, being in any one position for a prolonged period of time, no matter how correct your posture, can still lead to stiffness. PROPER SITTING POSTURE In order to minimize stress and discomfort on your spine, you must sit with correct posture Sitting with good posture should be effortless for a healthy body. Returning to good posture is a gradual process. Many people can work toward this most comfortably by using various supports until they have the flexibility and strength to maintain this posture on their own. When sitting with proper posture, your ears will fall over your shoulders and your shoulders will fall over your hips. You should use the back of the chair to support your upper back. Your low back will be in a neutral position, just slightly arched. You may place a small pillow or folded towel at the base of your low back for support.  When  working at a desk, create an environment that supports good, upright posture. Without extra support, muscles fatigue and lead to excessive strain on joints and other tissues. Keep these recommendations in mind: CHAIR:   A chair should be able to slide under your desk when your back makes contact with the back of the chair. This allows you to work closely.  The chair's height should allow your eyes to be level with the upper part of your monitor and your hands to be slightly lower than your elbows. BODY POSITION  Your feet should make contact with the floor. If this is not possible, use a foot rest.  Keep your ears over your shoulders. This will reduce stress on your neck and low back. INCORRECT SITTING POSTURES   If you are feeling tired and unable to assume a healthy sitting posture, do not slouch or slump. This puts excessive strain on your back tissues, causing more damage and pain. Healthier options include:  Using more support, like a lumbar pillow.  Switching tasks to something that requires you to be upright or walking.  Talking a brief walk.  Lying down to rest in a neutral-spine position. PROLONGED STANDING WHILE SLIGHTLY LEANING FORWARD  When completing a task that requires you to lean forward while standing in one place for a long time, place either foot up on a stationary 2-4 inch high object to help maintain the best posture. When both feet are on the ground, the low back tends to lose its slight inward curve. If this curve flattens (or becomes too large), then the back and your other joints will experience too much stress, fatigue more quickly and can cause pain.  CORRECT STANDING POSTURES Proper standing posture should be assumed with all daily activities, even if they only take a few moments, like when brushing your teeth. As in sitting, your ears should fall over your shoulders and your shoulders should fall over your hips. You should keep a slight tension in your abdominal  muscles to brace your spine. Your tailbone should point down to the ground, not behind your body, resulting in an over-extended swayback posture.  INCORRECT STANDING POSTURES  Common incorrect standing postures include a forward head, locked knees and/or an excessive swayback. WALKING Walk with an upright posture. Your ears, shoulders and hips should all line-up. PROLONGED ACTIVITY IN A FLEXED POSITION When completing a task that requires you to bend forward   at your waist or lean over a low surface, try to find a way to stabilize 3 of 4 of your limbs. You can place a hand or elbow on your thigh or rest a knee on the surface you are reaching across. This will provide you more stability so that your muscles do not fatigue as quickly. By keeping your knees relaxed, or slightly bent, you will also reduce stress across your low back. CORRECT LIFTING TECHNIQUES DO :   Assume a wide stance. This will provide you more stability and the opportunity to get as close as possible to the object which you are lifting.  Tense your abdominals to brace your spine; then bend at the knees and hips. Keeping your back locked in a neutral-spine position, lift using your leg muscles. Lift with your legs, keeping your back straight.  Test the weight of unknown objects before attempting to lift them.  Try to keep your elbows locked down at your sides in order get the best strength from your shoulders when carrying an object.  Always ask for help when lifting heavy or awkward objects. INCORRECT LIFTING TECHNIQUES DO NOT:   Lock your knees when lifting, even if it is a small object.  Bend and twist. Pivot at your feet or move your feet when needing to change directions.  Assume that you cannot safely pick up a paperclip without proper posture.   This information is not intended to replace advice given to you by your health care provider. Make sure you discuss any questions you have with your health care provider.     Document Released: 03/11/2005 Document Revised: 07/26/2014 Document Reviewed: 06/23/2008 [employer] Interactive Patient Education 2016 [employer]  

## 2015-05-19 ENCOUNTER — Encounter (HOSPITAL_COMMUNITY): Payer: Self-pay | Admitting: Psychiatry

## 2015-05-19 ENCOUNTER — Ambulatory Visit (INDEPENDENT_AMBULATORY_CARE_PROVIDER_SITE_OTHER): Payer: Medicaid Other | Admitting: Psychiatry

## 2015-05-19 ENCOUNTER — Other Ambulatory Visit: Payer: Self-pay | Admitting: Family

## 2015-05-19 ENCOUNTER — Telehealth: Payer: Self-pay | Admitting: Family

## 2015-05-19 ENCOUNTER — Other Ambulatory Visit (HOSPITAL_COMMUNITY): Payer: Self-pay | Admitting: Psychiatry

## 2015-05-19 VITALS — BP 110/74 | Ht 61.0 in | Wt 157.0 lb

## 2015-05-19 DIAGNOSIS — F32A Depression, unspecified: Secondary | ICD-10-CM

## 2015-05-19 DIAGNOSIS — F329 Major depressive disorder, single episode, unspecified: Secondary | ICD-10-CM | POA: Diagnosis not present

## 2015-05-19 DIAGNOSIS — F4310 Post-traumatic stress disorder, unspecified: Secondary | ICD-10-CM | POA: Diagnosis not present

## 2015-05-19 DIAGNOSIS — M5441 Lumbago with sciatica, right side: Secondary | ICD-10-CM

## 2015-05-19 MED ORDER — PREDNISONE 10 MG (21) PO TBPK: 10.0000 mg | ORAL_TABLET | Freq: Every day | ORAL | Status: DC

## 2015-05-19 MED ORDER — ALPRAZOLAM 1 MG PO TABS: 1.0000 mg | ORAL_TABLET | Freq: Every evening | ORAL | Status: DC | PRN

## 2015-05-19 MED ORDER — VENLAFAXINE HCL ER 150 MG PO CP24: ORAL_CAPSULE | ORAL | Status: DC

## 2015-05-19 NOTE — Telephone Encounter (Signed)
Prednisone and PT referral sent. Need to try this first before MRI
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
## 2015-05-19 NOTE — Telephone Encounter (Signed)
Please advise on MRI and route to pool A

## 2015-05-19 NOTE — Telephone Encounter (Signed)
Pt aware.

## 2015-05-29 ENCOUNTER — Ambulatory Visit (INDEPENDENT_AMBULATORY_CARE_PROVIDER_SITE_OTHER): Payer: Medicaid Other | Admitting: Family

## 2015-05-29 ENCOUNTER — Encounter: Payer: Self-pay | Admitting: Family

## 2015-05-29 VITALS — BP 115/79 | HR 97 | Temp 97.5°F | Ht 61.0 in | Wt 156.4 lb

## 2015-05-29 DIAGNOSIS — G43109 Migraine with aura, not intractable, without status migrainosus: Secondary | ICD-10-CM

## 2015-05-29 DIAGNOSIS — F32A Depression, unspecified: Secondary | ICD-10-CM

## 2015-05-29 DIAGNOSIS — K219 Gastro-esophageal reflux disease without esophagitis: Secondary | ICD-10-CM

## 2015-05-29 DIAGNOSIS — E785 Hyperlipidemia, unspecified: Secondary | ICD-10-CM

## 2015-05-29 DIAGNOSIS — F329 Major depressive disorder, single episode, unspecified: Secondary | ICD-10-CM

## 2015-05-29 DIAGNOSIS — Z1211 Encounter for screening for malignant neoplasm of colon: Secondary | ICD-10-CM | POA: Diagnosis not present

## 2015-05-29 DIAGNOSIS — F411 Generalized anxiety disorder: Secondary | ICD-10-CM

## 2015-05-29 DIAGNOSIS — E1165 Type 2 diabetes mellitus with hyperglycemia: Secondary | ICD-10-CM

## 2015-05-29 NOTE — Patient Instructions (Signed)
Health Maintenance, Female Adopting a healthy lifestyle and getting preventive care can go a long way to promote health and wellness. Talk with your health care provider about what schedule of regular examinations is right for you. This is a good chance for you to check in with your provider about disease prevention and staying healthy. In between checkups, there are plenty of things you can do on your own. Experts have done a lot of research about which lifestyle changes and preventive measures are most likely to keep you healthy. Ask your health care provider for more information. WEIGHT AND DIET  Eat a healthy diet  Be sure to include plenty of vegetables, fruits, low-fat dairy products, and lean protein.  Do not eat a lot of foods high in solid fats, added sugars, or salt.  Get regular exercise. This is one of the most important things you can do for your health.  Most adults should exercise for at least 150 minutes each week. The exercise should increase your heart rate and make you sweat (moderate-intensity exercise).  Most adults should also do strengthening exercises at least twice a week. This is in addition to the moderate-intensity exercise.  Maintain a healthy weight  Body mass index (BMI) is a measurement that can be used to identify possible weight problems. It estimates body fat based on height and weight. Your health care provider can help determine your BMI and help you achieve or maintain a healthy weight.  For females 20 years of age and older:   A BMI below 18.5 is considered underweight.  A BMI of 18.5 to 24.9 is normal.  A BMI of 25 to 29.9 is considered overweight.  A BMI of 30 and above is considered obese.  Watch levels of cholesterol and blood lipids  You should start having your blood tested for lipids and cholesterol at 56 years of age, then have this test every 5 years.  You may need to have your cholesterol levels checked more often if:  Your lipid  or cholesterol levels are high.  You are older than 56 years of age.  You are at high risk for heart disease.  CANCER SCREENING   Lung Cancer  Lung cancer screening is recommended for adults 55-80 years old who are at high risk for lung cancer because of a history of smoking.  A yearly low-dose CT scan of the lungs is recommended for people who:  Currently smoke.  Have quit within the past 15 years.  Have at least a 30-pack-year history of smoking. A pack year is smoking an average of one pack of cigarettes a day for 1 year.  Yearly screening should continue until it has been 15 years since you quit.  Yearly screening should stop if you develop a health problem that would prevent you from having lung cancer treatment.  Breast Cancer  Practice breast self-awareness. This means understanding how your breasts normally appear and feel.  It also means doing regular breast self-exams. Let your health care provider know about any changes, no matter how small.  If you are in your 20s or 30s, you should have a clinical breast exam (CBE) by a health care provider every 1-3 years as part of a regular health exam.  If you are 40 or older, have a CBE every year. Also consider having a breast X-ray (mammogram) every year.  If you have a family history of breast cancer, talk to your health care provider about genetic screening.  If you   are at high risk for breast cancer, talk to your health care provider about having an MRI and a mammogram every year.  Breast cancer gene (BRCA) assessment is recommended for women who have family members with BRCA-related cancers. BRCA-related cancers include:  Breast.  Ovarian.  Tubal.  Peritoneal cancers.  Results of the assessment will determine the need for genetic counseling and BRCA1 and BRCA2 testing. Cervical Cancer Your health care provider may recommend that you be screened regularly for cancer of the pelvic organs (ovaries, uterus, and  vagina). This screening involves a pelvic examination, including checking for microscopic changes to the surface of your cervix (Pap test). You may be encouraged to have this screening done every 3 years, beginning at age 21.  For women ages 30-65, health care providers may recommend pelvic exams and Pap testing every 3 years, or they may recommend the Pap and pelvic exam, combined with testing for human papilloma virus (HPV), every 5 years. Some types of HPV increase your risk of cervical cancer. Testing for HPV may also be done on women of any age with unclear Pap test results.  Other health care providers may not recommend any screening for nonpregnant women who are considered low risk for pelvic cancer and who do not have symptoms. Ask your health care provider if a screening pelvic exam is right for you.  If you have had past treatment for cervical cancer or a condition that could lead to cancer, you need Pap tests and screening for cancer for at least 20 years after your treatment. If Pap tests have been discontinued, your risk factors (such as having a new sexual partner) need to be reassessed to determine if screening should resume. Some women have medical problems that increase the chance of getting cervical cancer. In these cases, your health care provider may recommend more frequent screening and Pap tests. Colorectal Cancer  This type of cancer can be detected and often prevented.  Routine colorectal cancer screening usually begins at 56 years of age and continues through 56 years of age.  Your health care provider may recommend screening at an earlier age if you have risk factors for colon cancer.  Your health care provider may also recommend using home test kits to check for hidden blood in the stool.  A small camera at the end of a tube can be used to examine your colon directly (sigmoidoscopy or colonoscopy). This is done to check for the earliest forms of colorectal  cancer.  Routine screening usually begins at age 50.  Direct examination of the colon should be repeated every 5-10 years through 56 years of age. However, you may need to be screened more often if early forms of precancerous polyps or small growths are found. Skin Cancer  Check your skin from head to toe regularly.  Tell your health care provider about any new moles or changes in moles, especially if there is a change in a mole's shape or color.  Also tell your health care provider if you have a mole that is larger than the size of a pencil eraser.  Always use sunscreen. Apply sunscreen liberally and repeatedly throughout the day.  Protect yourself by wearing long sleeves, pants, a wide-brimmed hat, and sunglasses whenever you are outside. HEART DISEASE, DIABETES, AND HIGH BLOOD PRESSURE   High blood pressure causes heart disease and increases the risk of stroke. High blood pressure is more likely to develop in:  People who have blood pressure in the high end   of the normal range (130-139/85-89 mm Hg).  People who are overweight or obese.  People who are African American.  If you are 38-23 years of age, have your blood pressure checked every 3-5 years. If you are 61 years of age or older, have your blood pressure checked every year. You should have your blood pressure measured twice--once when you are at a hospital or clinic, and once when you are not at a hospital or clinic. Record the average of the two measurements. To check your blood pressure when you are not at a hospital or clinic, you can use:  An automated blood pressure machine at a pharmacy.  A home blood pressure monitor.  If you are between 45 years and 39 years old, ask your health care provider if you should take aspirin to prevent strokes.  Have regular diabetes screenings. This involves taking a blood sample to check your fasting blood sugar level.  If you are at a normal weight and have a low risk for diabetes,  have this test once every three years after 56 years of age.  If you are overweight and have a high risk for diabetes, consider being tested at a younger age or more often. PREVENTING INFECTION  Hepatitis B  If you have a higher risk for hepatitis B, you should be screened for this virus. You are considered at high risk for hepatitis B if:  You were born in a country where hepatitis B is common. Ask your health care provider which countries are considered high risk.  Your parents were born in a high-risk country, and you have not been immunized against hepatitis B (hepatitis B vaccine).  You have HIV or AIDS.  You use needles to inject street drugs.  You live with someone who has hepatitis B.  You have had sex with someone who has hepatitis B.  You get hemodialysis treatment.  You take certain medicines for conditions, including cancer, organ transplantation, and autoimmune conditions. Hepatitis C  Blood testing is recommended for:  Everyone born from [birth year] through 1966.  Anyone with known risk factors for hepatitis C. Sexually transmitted infections (STIs)  You should be screened for sexually transmitted infections (STIs) including gonorrhea and chlamydia if:  You are sexually active and are younger than 56 years of age.  You are older than 56 years of age and your health care provider tells you that you are at risk for this type of infection.  Your sexual activity has changed since you were last screened and you are at an increased risk for chlamydia or gonorrhea. Ask your health care provider if you are at risk.  If you do not have HIV, but are at risk, it may be recommended that you take a prescription medicine daily to prevent HIV infection. This is called pre-exposure prophylaxis (PrEP). You are considered at risk if:  You are sexually active and do not regularly use condoms or know the HIV status of your partner(s).  You take drugs by injection.  You are sexually  active with a partner who has HIV. Talk with your health care provider about whether you are at high risk of being infected with HIV. If you choose to begin PrEP, you should first be tested for HIV. You should then be tested every 3 months for as long as you are taking PrEP.  PREGNANCY   If you are premenopausal and you may become pregnant, ask your health care provider about preconception counseling.  If you may  become pregnant, take 400 to 800 micrograms (mcg) of folic acid every day.  If you want to prevent pregnancy, talk to your health care provider about birth control (contraception). OSTEOPOROSIS AND MENOPAUSE   Osteoporosis is a disease in which the bones lose minerals and strength with aging. This can result in serious bone fractures. Your risk for osteoporosis can be identified using a bone density scan.  If you are 61 years of age or older, or if you are at risk for osteoporosis and fractures, ask your health care provider if you should be screened.  Ask your health care provider whether you should take a calcium or vitamin D supplement to lower your risk for osteoporosis.  Menopause may have certain physical symptoms and risks.  Hormone replacement therapy may reduce some of these symptoms and risks. Talk to your health care provider about whether hormone replacement therapy is right for you.  HOME CARE INSTRUCTIONS   Schedule regular health, dental, and eye exams.  Stay current with your immunizations.   Do not use any tobacco products including cigarettes, chewing tobacco, or electronic cigarettes.  If you are pregnant, do not drink alcohol.  If you are breastfeeding, limit how much and how often you drink alcohol.  Limit alcohol intake to no more than 1 drink per day for nonpregnant women. One drink equals 12 ounces of beer, 5 ounces of wine, or 1 ounces of hard liquor.  Do not use street drugs.  Do not share needles.  Ask your health care provider for help if  you need support or information about quitting drugs.  Tell your health care provider if you often feel depressed.  Tell your health care provider if you have ever been abused or do not feel safe at home.   This information is not intended to replace advice given to you by your health care provider. Make sure you discuss any questions you have with your health care provider.   Document Released: 09/24/2010 Document Revised: 04/01/2014 Document Reviewed: 02/10/2013 Elsevier Interactive Patient Education [employer].

## 2015-05-29 NOTE — Progress Notes (Signed)
Subjective:    Patient ID: Charlotte Perez, female    DOB: 1960/07/26, 55 y.o.   MRN: [id number]  PT presents to the office today for chronic follow up. Diabetes She presents for her follow-up diabetic visit. She has type 2 diabetes mellitus. Her disease course has been fluctuating. Pertinent negatives for hypoglycemia include no confusion, headaches, mood changes, nervousness/anxiousness, pallor or sleepiness. Associated symptoms include foot paresthesias. Pertinent negatives for diabetes include no blurred vision, no chest pain, no foot ulcerations and no visual change. Pertinent negatives for hypoglycemia complications include no blackouts and no hospitalization. Symptoms are worsening. Diabetic complications include peripheral neuropathy. Pertinent negatives for diabetic complications include no CVA, heart disease or nephropathy. Risk factors for coronary artery disease include diabetes mellitus, dyslipidemia, hypertension, stress, sedentary lifestyle and family history. Current diabetic treatment includes oral agent (triple therapy). She is compliant with treatment all of the time. She is following a generally healthy diet. Her breakfast blood glucose range is generally 180-200 mg/dl. An ACE inhibitor/angiotensin II receptor blocker is being taken. Eye exam is current.  Hyperlipidemia This is a chronic problem. The current episode started more than 1 year ago. The problem is controlled. Recent lipid tests were reviewed and are normal. Exacerbating diseases include diabetes. Pertinent negatives include no chest pain, myalgias or shortness of breath. Current antihyperlipidemic treatment includes statins. The current treatment provides significant improvement of lipids. Risk factors for coronary artery disease include diabetes mellitus, dyslipidemia, family history, hypertension and post-menopausal.  Anxiety Presents for follow-up visit. Onset was 1 to 6 months ago. The problem has been waxing and  waning. Symptoms include depressed mood and excessive worry. Patient reports no chest pain, confusion, irritability, nervous/anxious behavior, palpitations, panic, restlessness, shortness of breath or suicidal ideas. Symptoms occur occasionally. The severity of symptoms is mild.   Her past medical history is significant for anxiety/panic attacks and depression. Past treatments include benzodiazephines and non-SSRI antidepressants. The treatment provided moderate relief. Compliance with prior treatments has been good.  Gastroesophageal Reflux She reports no belching, no chest pain, no coughing or no heartburn. This is a chronic problem. The current episode started more than 1 year ago. The problem occurs rarely. The problem has been waxing and waning. The symptoms are aggravated by certain foods. She has tried a PPI for the symptoms. The treatment provided significant relief.  Depression      The patient presents with depression.  This is a chronic (Pt see's DR. Rudean Haskell at behavioral health ) problem.  The current episode started more than 1 year ago.   The onset quality is gradual.   The problem occurs rarely.  The problem has been waxing and waning since onset.  Associated symptoms include no helplessness, no hopelessness, no restlessness, no myalgias, no headaches, not sad and no suicidal ideas.  Past treatments include SNRIs - Serotonin and norepinephrine reuptake inhibitors.  Compliance with treatment is good.  Past medical history includes anxiety and depression.   Migraine  This is a chronic problem. The current episode started more than 1 year ago. Episode frequency: every 4-5 months. The problem has been waxing and waning. The pain is located in the bilateral and occipital region. The pain is at a severity of 7/10. The pain is moderate. Pertinent negatives include no blurred vision, coughing or visual change. She has tried acetaminophen and ketorolac injections for the symptoms. The treatment  provided moderate relief. Her past medical history is significant for migraine headaches.      Review  of Systems  Constitutional: Negative.  Negative for irritability.  HENT: Negative.   Eyes: Negative.  Negative for blurred vision.  Respiratory: Negative.  Negative for cough and shortness of breath.   Cardiovascular: Negative.  Negative for chest pain and palpitations.  Gastrointestinal: Negative.  Negative for heartburn.  Endocrine: Negative.   Genitourinary: Negative.   Musculoskeletal: Negative.  Negative for myalgias.  Skin: Negative for pallor.  Neurological: Negative.  Negative for headaches.  Hematological: Negative.   Psychiatric/Behavioral: Positive for depression. Negative for suicidal ideas and confusion. The patient is not nervous/anxious.   All other systems reviewed and are negative.      Objective:   Physical Exam  Constitutional: She is oriented to person, place, and time. She appears well-developed and well-nourished. No distress.  HENT:  Head: Normocephalic and atraumatic.  Right Ear: External ear normal.  Left Ear: External ear normal.  Nose: Nose normal.  Mouth/Throat: Oropharynx is clear and moist.  Eyes: Pupils are equal, round, and reactive to light.  Neck: Normal range of motion. Neck supple. No thyromegaly present.  Cardiovascular: Normal rate, regular rhythm, normal heart sounds and intact distal pulses.   No murmur heard. Pulmonary/Chest: Effort normal and breath sounds normal. No respiratory distress. She has no wheezes.  Abdominal: Soft. Bowel sounds are normal. She exhibits no distension. There is no tenderness.  Musculoskeletal: Normal range of motion. She exhibits no edema or tenderness.  Neurological: She is alert and oriented to person, place, and time. She has normal reflexes. No cranial nerve deficit.  Skin: Skin is warm and dry.  Psychiatric: She has a normal mood and affect. Her behavior is normal. Judgment and thought content normal.    Vitals reviewed.  See Diabetic foot note   BP 115/79 mmHg  Pulse 97  Temp(Src) 97.5 F (36.4 C) (Oral)  Ht '5\' 1"'$  (2.536 m)  Wt 156 lb 6.4 oz (70.943 kg)  BMI 29.57 kg/m2     Assessment & Plan:  1. Migraine with aura and without status migrainosus, not intractable - CMP14+EGFR  2. Type 2 diabetes mellitus with hyperglycemia, without long-term current use of insulin (HCC) - Bayer DCA Hb A1c Waived - CMP14+EGFR  3. GAD (generalized anxiety disorder) - CMP14+EGFR  4. Depression  - CMP14+EGFR  5. Hyperlipidemia - CMP14+EGFR - Lipid panel  6. Gastroesophageal reflux disease without esophagitis - CMP14+EGFR  7. Colon cancer screening  - Ambulatory referral to Gastroenterology   Continue all meds Labs pending Health Maintenance reviewed Diet and exercise encouraged RTO 3 months  Evelina Dun, FNP

## 2015-05-30 LAB — CMP14+EGFR
A/G RATIO: 1.5 (ref 1.1–2.5)
ALK PHOS: 55 IU/L (ref 39–117)
ALT: 13 IU/L (ref 0–32)
AST: 12 IU/L (ref 0–40)
Albumin: 4 g/dL (ref 3.5–5.5)
BUN/Creatinine Ratio: 23 (ref 9–23)
BUN: 16 mg/dL (ref 6–24)
Bilirubin Total: <0.2 mg/dL (ref 0.0–1.2)
CALCIUM: 9.8 mg/dL (ref 8.7–10.2)
CO2: 24 mmol/L (ref 18–29)
Chloride: 97 mmol/L (ref 96–106)
Creatinine, Ser: 0.69 mg/dL (ref 0.57–1.00)
GFR calc Af Amer: 113 mL/min/{1.73_m2} (ref >59)
GFR, EST NON AFRICAN AMERICAN: 98 mL/min/{1.73_m2} (ref >59)
GLOBULIN, TOTAL: 2.7 g/dL (ref 1.5–4.5)
Glucose: 103 mg/dL — ABNORMAL HIGH (ref 65–99)
POTASSIUM: 5.5 mmol/L — AB (ref 3.5–5.2)
SODIUM: 141 mmol/L (ref 134–144)
Total Protein: 6.7 g/dL (ref 6.0–8.5)

## 2015-05-30 LAB — LIPID PANEL
CHOL/HDL RATIO: 1.9 ratio (ref 0.0–4.4)
Cholesterol, Total: 183 mg/dL (ref 100–199)
HDL: 98 mg/dL (ref >39)
LDL Calculated: 66 mg/dL (ref 0–99)
TRIGLYCERIDES: 96 mg/dL (ref 0–149)
VLDL Cholesterol Cal: 19 mg/dL (ref 5–40)

## 2015-06-01 ENCOUNTER — Other Ambulatory Visit: Payer: Self-pay | Admitting: Family

## 2015-06-01 ENCOUNTER — Telehealth: Payer: Self-pay | Admitting: Family

## 2015-06-01 DIAGNOSIS — E875 Hyperkalemia: Secondary | ICD-10-CM

## 2015-06-01 LAB — BAYER DCA HB A1C WAIVED: HB A1C: 6.8 % (ref <7.0)

## 2015-06-01 MED ORDER — PREDNISONE 10 MG (21) PO TBPK: 10.0000 mg | ORAL_TABLET | Freq: Every day | ORAL | Status: DC

## 2015-06-01 NOTE — Telephone Encounter (Signed)
Pt aware rx was sent.

## 2015-06-01 NOTE — Telephone Encounter (Signed)
Prednisone Prescription sent to pharmacy   

## 2015-06-01 NOTE — Addendum Note (Signed)
Addended by: Evelina Dun A on: 06/01/2015 02:05 PM   Modules accepted: Orders

## 2015-06-01 NOTE — Telephone Encounter (Signed)
Pt aware of labs. Pt states that you told her if her A1c was okay that you would refill the prednisone for her lower back pain? Please advise

## 2015-06-08 ENCOUNTER — Telehealth: Payer: Self-pay | Admitting: Family

## 2015-06-08 DIAGNOSIS — Z1211 Encounter for screening for malignant neoplasm of colon: Secondary | ICD-10-CM

## 2015-06-08 NOTE — Telephone Encounter (Signed)
Patient aware referral replaced for Charlotte Perez.

## 2015-06-16 ENCOUNTER — Other Ambulatory Visit: Payer: Self-pay | Admitting: Family

## 2015-06-26 ENCOUNTER — Ambulatory Visit: Payer: Self-pay | Admitting: Physical Therapy

## 2015-06-30 ENCOUNTER — Encounter: Payer: Self-pay | Admitting: *Deleted

## 2015-06-30 ENCOUNTER — Encounter (INDEPENDENT_AMBULATORY_CARE_PROVIDER_SITE_OTHER): Payer: Self-pay

## 2015-06-30 ENCOUNTER — Other Ambulatory Visit: Payer: Medicaid Other

## 2015-06-30 DIAGNOSIS — E875 Hyperkalemia: Secondary | ICD-10-CM

## 2015-06-30 LAB — BMP8+EGFR
BUN/Creatinine Ratio: 19 (ref 9–23)
BUN: 13 mg/dL (ref 6–24)
CO2: 24 mmol/L (ref 18–29)
Calcium: 9.7 mg/dL (ref 8.7–10.2)
Chloride: 101 mmol/L (ref 96–106)
Creatinine, Ser: 0.67 mg/dL (ref 0.57–1.00)
GFR calc Af Amer: 114 mL/min/{1.73_m2} (ref >59)
GFR calc non Af Amer: 99 mL/min/{1.73_m2} (ref >59)
Glucose: 122 mg/dL — ABNORMAL HIGH (ref 65–99)
Potassium: 5.3 mmol/L — ABNORMAL HIGH (ref 3.5–5.2)
Sodium: 140 mmol/L (ref 134–144)

## 2015-07-17 ENCOUNTER — Other Ambulatory Visit (HOSPITAL_COMMUNITY): Payer: Self-pay | Admitting: Psychiatry

## 2015-07-17 ENCOUNTER — Other Ambulatory Visit: Payer: Self-pay | Admitting: Family

## 2015-08-16 ENCOUNTER — Other Ambulatory Visit: Payer: Self-pay | Admitting: Family

## 2015-08-16 ENCOUNTER — Ambulatory Visit (INDEPENDENT_AMBULATORY_CARE_PROVIDER_SITE_OTHER): Payer: Medicaid Other | Admitting: Psychiatry

## 2015-08-16 ENCOUNTER — Encounter (HOSPITAL_COMMUNITY): Payer: Self-pay | Admitting: Psychiatry

## 2015-08-16 VITALS — BP 119/79 | HR 83 | Ht 61.0 in | Wt 159.2 lb

## 2015-08-16 DIAGNOSIS — F909 Attention-deficit hyperactivity disorder, unspecified type: Secondary | ICD-10-CM

## 2015-08-16 DIAGNOSIS — F32A Depression, unspecified: Secondary | ICD-10-CM

## 2015-08-16 DIAGNOSIS — F329 Major depressive disorder, single episode, unspecified: Secondary | ICD-10-CM

## 2015-08-16 MED ORDER — ALPRAZOLAM 1 MG PO TABS: 1.0000 mg | ORAL_TABLET | Freq: Every evening | ORAL | Status: DC | PRN

## 2015-08-16 MED ORDER — VENLAFAXINE HCL ER 150 MG PO CP24: ORAL_CAPSULE | ORAL | Status: DC

## 2015-08-16 MED ORDER — METHYLPHENIDATE HCL 10 MG PO TABS: 10.0000 mg | ORAL_TABLET | Freq: Two times a day (BID) | ORAL | Status: DC
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
## 2015-08-31 ENCOUNTER — Encounter: Payer: Self-pay | Admitting: Family

## 2015-08-31 ENCOUNTER — Ambulatory Visit (INDEPENDENT_AMBULATORY_CARE_PROVIDER_SITE_OTHER): Payer: Medicaid Other | Admitting: Family

## 2015-08-31 VITALS — BP 105/79 | HR 84 | Temp 97.0°F | Ht 61.0 in | Wt 154.8 lb

## 2015-08-31 DIAGNOSIS — E663 Overweight: Secondary | ICD-10-CM | POA: Insufficient documentation

## 2015-08-31 DIAGNOSIS — K219 Gastro-esophageal reflux disease without esophagitis: Secondary | ICD-10-CM

## 2015-08-31 DIAGNOSIS — E785 Hyperlipidemia, unspecified: Secondary | ICD-10-CM

## 2015-08-31 DIAGNOSIS — G43109 Migraine with aura, not intractable, without status migrainosus: Secondary | ICD-10-CM | POA: Diagnosis not present

## 2015-08-31 DIAGNOSIS — F411 Generalized anxiety disorder: Secondary | ICD-10-CM | POA: Diagnosis not present

## 2015-08-31 DIAGNOSIS — E1165 Type 2 diabetes mellitus with hyperglycemia: Secondary | ICD-10-CM

## 2015-08-31 DIAGNOSIS — F329 Major depressive disorder, single episode, unspecified: Secondary | ICD-10-CM | POA: Diagnosis not present

## 2015-08-31 DIAGNOSIS — F32A Depression, unspecified: Secondary | ICD-10-CM

## 2015-08-31 LAB — BAYER DCA HB A1C WAIVED: HB A1C: 7 % — AB (ref <7.0)

## 2015-08-31 NOTE — Patient Instructions (Signed)
Health Maintenance, Female Adopting a healthy lifestyle and getting preventive care can go a long way to promote health and wellness. Talk with your health care provider about what schedule of regular examinations is right for you. This is a good chance for you to check in with your provider about disease prevention and staying healthy. In between checkups, there are plenty of things you can do on your own. Experts have done a lot of research about which lifestyle changes and preventive measures are most likely to keep you healthy. Ask your health care provider for more information. WEIGHT AND DIET  Eat a healthy diet  Be sure to include plenty of vegetables, fruits, low-fat dairy products, and lean protein.  Do not eat a lot of foods high in solid fats, added sugars, or salt.  Get regular exercise. This is one of the most important things you can do for your health.  Most adults should exercise for at least 150 minutes each week. The exercise should increase your heart rate and make you sweat (moderate-intensity exercise).  Most adults should also do strengthening exercises at least twice a week. This is in addition to the moderate-intensity exercise.  Maintain a healthy weight  Body mass index (BMI) is a measurement that can be used to identify possible weight problems. It estimates body fat based on height and weight. Your health care provider can help determine your BMI and help you achieve or maintain a healthy weight.  For females 20 years of age and older:   A BMI below 18.5 is considered underweight.  A BMI of 18.5 to 24.9 is normal.  A BMI of 25 to 29.9 is considered overweight.  A BMI of 30 and above is considered obese.  Watch levels of cholesterol and blood lipids  You should start having your blood tested for lipids and cholesterol at 56 years of age, then have this test every 5 years.  You may need to have your cholesterol levels checked more often if:  Your lipid  or cholesterol levels are high.  You are older than 56 years of age.  You are at high risk for heart disease.  CANCER SCREENING   Lung Cancer  Lung cancer screening is recommended for adults 55-80 years old who are at high risk for lung cancer because of a history of smoking.  A yearly low-dose CT scan of the lungs is recommended for people who:  Currently smoke.  Have quit within the past 15 years.  Have at least a 30-pack-year history of smoking. A pack year is smoking an average of one pack of cigarettes a day for 1 year.  Yearly screening should continue until it has been 15 years since you quit.  Yearly screening should stop if you develop a health problem that would prevent you from having lung cancer treatment.  Breast Cancer  Practice breast self-awareness. This means understanding how your breasts normally appear and feel.  It also means doing regular breast self-exams. Let your health care provider know about any changes, no matter how small.  If you are in your 20s or 30s, you should have a clinical breast exam (CBE) by a health care provider every 1-3 years as part of a regular health exam.  If you are 40 or older, have a CBE every year. Also consider having a breast X-ray (mammogram) every year.  If you have a family history of breast cancer, talk to your health care provider about genetic screening.  If you   are at high risk for breast cancer, talk to your health care provider about having an MRI and a mammogram every year.  Breast cancer gene (BRCA) assessment is recommended for women who have family members with BRCA-related cancers. BRCA-related cancers include:  Breast.  Ovarian.  Tubal.  Peritoneal cancers.  Results of the assessment will determine the need for genetic counseling and BRCA1 and BRCA2 testing. Cervical Cancer Your health care provider may recommend that you be screened regularly for cancer of the pelvic organs (ovaries, uterus, and  vagina). This screening involves a pelvic examination, including checking for microscopic changes to the surface of your cervix (Pap test). You may be encouraged to have this screening done every 3 years, beginning at age 21.  For women ages 30-65, health care providers may recommend pelvic exams and Pap testing every 3 years, or they may recommend the Pap and pelvic exam, combined with testing for human papilloma virus (HPV), every 5 years. Some types of HPV increase your risk of cervical cancer. Testing for HPV may also be done on women of any age with unclear Pap test results.  Other health care providers may not recommend any screening for nonpregnant women who are considered low risk for pelvic cancer and who do not have symptoms. Ask your health care provider if a screening pelvic exam is right for you.  If you have had past treatment for cervical cancer or a condition that could lead to cancer, you need Pap tests and screening for cancer for at least 20 years after your treatment. If Pap tests have been discontinued, your risk factors (such as having a new sexual partner) need to be reassessed to determine if screening should resume. Some women have medical problems that increase the chance of getting cervical cancer. In these cases, your health care provider may recommend more frequent screening and Pap tests. Colorectal Cancer  This type of cancer can be detected and often prevented.  Routine colorectal cancer screening usually begins at 56 years of age and continues through 56 years of age.  Your health care provider may recommend screening at an earlier age if you have risk factors for colon cancer.  Your health care provider may also recommend using home test kits to check for hidden blood in the stool.  A small camera at the end of a tube can be used to examine your colon directly (sigmoidoscopy or colonoscopy). This is done to check for the earliest forms of colorectal  cancer.  Routine screening usually begins at age 50.  Direct examination of the colon should be repeated every 5-10 years through 56 years of age. However, you may need to be screened more often if early forms of precancerous polyps or small growths are found. Skin Cancer  Check your skin from head to toe regularly.  Tell your health care provider about any new moles or changes in moles, especially if there is a change in a mole's shape or color.  Also tell your health care provider if you have a mole that is larger than the size of a pencil eraser.  Always use sunscreen. Apply sunscreen liberally and repeatedly throughout the day.  Protect yourself by wearing long sleeves, pants, a wide-brimmed hat, and sunglasses whenever you are outside. HEART DISEASE, DIABETES, AND HIGH BLOOD PRESSURE   High blood pressure causes heart disease and increases the risk of stroke. High blood pressure is more likely to develop in:  People who have blood pressure in the high end   of the normal range (130-139/85-89 mm Hg).  People who are overweight or obese.  People who are African American.  If you are 38-23 years of age, have your blood pressure checked every 3-5 years. If you are 61 years of age or older, have your blood pressure checked every year. You should have your blood pressure measured twice--once when you are at a hospital or clinic, and once when you are not at a hospital or clinic. Record the average of the two measurements. To check your blood pressure when you are not at a hospital or clinic, you can use:  An automated blood pressure machine at a pharmacy.  A home blood pressure monitor.  If you are between 45 years and 39 years old, ask your health care provider if you should take aspirin to prevent strokes.  Have regular diabetes screenings. This involves taking a blood sample to check your fasting blood sugar level.  If you are at a normal weight and have a low risk for diabetes,  have this test once every three years after 56 years of age.  If you are overweight and have a high risk for diabetes, consider being tested at a younger age or more often. PREVENTING INFECTION  Hepatitis B  If you have a higher risk for hepatitis B, you should be screened for this virus. You are considered at high risk for hepatitis B if:  You were born in a country where hepatitis B is common. Ask your health care provider which countries are considered high risk.  Your parents were born in a high-risk country, and you have not been immunized against hepatitis B (hepatitis B vaccine).  You have HIV or AIDS.  You use needles to inject street drugs.  You live with someone who has hepatitis B.  You have had sex with someone who has hepatitis B.  You get hemodialysis treatment.  You take certain medicines for conditions, including cancer, organ transplantation, and autoimmune conditions. Hepatitis C  Blood testing is recommended for:  Everyone born from [birth year] through 1966.  Anyone with known risk factors for hepatitis C. Sexually transmitted infections (STIs)  You should be screened for sexually transmitted infections (STIs) including gonorrhea and chlamydia if:  You are sexually active and are younger than 56 years of age.  You are older than 56 years of age and your health care provider tells you that you are at risk for this type of infection.  Your sexual activity has changed since you were last screened and you are at an increased risk for chlamydia or gonorrhea. Ask your health care provider if you are at risk.  If you do not have HIV, but are at risk, it may be recommended that you take a prescription medicine daily to prevent HIV infection. This is called pre-exposure prophylaxis (PrEP). You are considered at risk if:  You are sexually active and do not regularly use condoms or know the HIV status of your partner(s).  You take drugs by injection.  You are sexually  active with a partner who has HIV. Talk with your health care provider about whether you are at high risk of being infected with HIV. If you choose to begin PrEP, you should first be tested for HIV. You should then be tested every 3 months for as long as you are taking PrEP.  PREGNANCY   If you are premenopausal and you may become pregnant, ask your health care provider about preconception counseling.  If you may  become pregnant, take 400 to 800 micrograms (mcg) of folic acid every day.  If you want to prevent pregnancy, talk to your health care provider about birth control (contraception). OSTEOPOROSIS AND MENOPAUSE   Osteoporosis is a disease in which the bones lose minerals and strength with aging. This can result in serious bone fractures. Your risk for osteoporosis can be identified using a bone density scan.  If you are 61 years of age or older, or if you are at risk for osteoporosis and fractures, ask your health care provider if you should be screened.  Ask your health care provider whether you should take a calcium or vitamin D supplement to lower your risk for osteoporosis.  Menopause may have certain physical symptoms and risks.  Hormone replacement therapy may reduce some of these symptoms and risks. Talk to your health care provider about whether hormone replacement therapy is right for you.  HOME CARE INSTRUCTIONS   Schedule regular health, dental, and eye exams.  Stay current with your immunizations.   Do not use any tobacco products including cigarettes, chewing tobacco, or electronic cigarettes.  If you are pregnant, do not drink alcohol.  If you are breastfeeding, limit how much and how often you drink alcohol.  Limit alcohol intake to no more than 1 drink per day for nonpregnant women. One drink equals 12 ounces of beer, 5 ounces of wine, or 1 ounces of hard liquor.  Do not use street drugs.  Do not share needles.  Ask your health care provider for help if  you need support or information about quitting drugs.  Tell your health care provider if you often feel depressed.  Tell your health care provider if you have ever been abused or do not feel safe at home.   This information is not intended to replace advice given to you by your health care provider. Make sure you discuss any questions you have with your health care provider.   Document Released: 09/24/2010 Document Revised: 04/01/2014 Document Reviewed: 02/10/2013 Elsevier Interactive Patient Education [employer].

## 2015-08-31 NOTE — Progress Notes (Signed)
Subjective:    Patient ID: Charlotte Perez, female    DOB: 1960/06/11, 55 y.o.   MRN: [id number]  PT presents to the office today for chronic follow up. Diabetes She presents for her follow-up diabetic visit. She has type 2 diabetes mellitus. Her disease course has been fluctuating. Hypoglycemia symptoms include nervousness/anxiousness. Pertinent negatives for hypoglycemia include no confusion, headaches, mood changes, pallor or sleepiness. Associated symptoms include foot paresthesias. Pertinent negatives for diabetes include no blurred vision, no chest pain, no foot ulcerations and no visual change. Pertinent negatives for hypoglycemia complications include no blackouts and no hospitalization. Symptoms are worsening. Diabetic complications include peripheral neuropathy. Pertinent negatives for diabetic complications include no CVA, heart disease or nephropathy. Risk factors for coronary artery disease include diabetes mellitus, dyslipidemia, hypertension, stress, sedentary lifestyle and family history. Current diabetic treatment includes oral agent (triple therapy). She is compliant with treatment all of the time. She is following a generally healthy diet. Her breakfast blood glucose range is generally 130-140 mg/dl. An ACE inhibitor/angiotensin II receptor blocker is being taken. Eye exam is current.  Hyperlipidemia This is a chronic problem. The current episode started more than 1 year ago. The problem is controlled. Recent lipid tests were reviewed and are normal. Exacerbating diseases include diabetes. Pertinent negatives include no chest pain, myalgias or shortness of breath. Current antihyperlipidemic treatment includes statins. The current treatment provides significant improvement of lipids. Risk factors for coronary artery disease include diabetes mellitus, dyslipidemia, family history, hypertension and post-menopausal.  Anxiety Presents for follow-up visit. Onset was 1 to 6 months ago. The  problem has been waxing and waning. Symptoms include depressed mood, excessive worry and nervous/anxious behavior. Patient reports no chest pain, confusion, irritability, palpitations, panic, restlessness, shortness of breath or suicidal ideas. Symptoms occur occasionally. The severity of symptoms is mild.   Her past medical history is significant for anxiety/panic attacks and depression. Past treatments include benzodiazephines and non-SSRI antidepressants. The treatment provided moderate relief. Compliance with prior treatments has been good.  Gastroesophageal Reflux She reports no belching, no chest pain, no coughing or no heartburn. This is a chronic problem. The current episode started more than 1 year ago. The problem occurs rarely. The problem has been waxing and waning. The symptoms are aggravated by certain foods. She has tried a PPI for the symptoms. The treatment provided significant relief.  Depression      The patient presents with depression.  This is a chronic (Pt see's DR. Rudean Haskell at behavioral health ) problem.  The current episode started more than 1 year ago.   The onset quality is gradual.   The problem occurs rarely.  The problem has been waxing and waning since onset.  Associated symptoms include no helplessness, no hopelessness, no restlessness, no myalgias, no headaches, not sad and no suicidal ideas.  Past treatments include SNRIs - Serotonin and norepinephrine reuptake inhibitors.  Compliance with treatment is good.  Past medical history includes anxiety and depression.   Migraine  This is a chronic problem. The current episode started more than 1 year ago. Episode frequency: Pt states it has been a year since her last migraine. The problem has been resolved. The pain is located in the bilateral and occipital region. The pain is at a severity of 7/10. The pain is moderate. Pertinent negatives include no blurred vision, coughing or visual change. She has tried acetaminophen and  ketorolac injections for the symptoms. The treatment provided moderate relief. Her past medical history is significant  for migraine headaches.      Review of Systems  Constitutional: Negative.  Negative for irritability.  HENT: Negative.   Eyes: Negative.  Negative for blurred vision.  Respiratory: Negative.  Negative for cough and shortness of breath.   Cardiovascular: Negative.  Negative for chest pain and palpitations.  Gastrointestinal: Negative.  Negative for heartburn.  Endocrine: Negative.   Genitourinary: Negative.   Musculoskeletal: Negative.  Negative for myalgias.  Skin: Negative for pallor.  Neurological: Negative.  Negative for headaches.  Hematological: Negative.   Psychiatric/Behavioral: Positive for depression. Negative for suicidal ideas and confusion. The patient is nervous/anxious.   All other systems reviewed and are negative.      Objective:   Physical Exam  Constitutional: She is oriented to person, place, and time. She appears well-developed and well-nourished. No distress.  HENT:  Head: Normocephalic and atraumatic.  Right Ear: External ear normal.  Left Ear: External ear normal.  Nose: Nose normal.  Mouth/Throat: Oropharynx is clear and moist.  Eyes: Pupils are equal, round, and reactive to light.  Neck: Normal range of motion. Neck supple. No thyromegaly present.  Cardiovascular: Normal rate, regular rhythm, normal heart sounds and intact distal pulses.   No murmur heard. Pulmonary/Chest: Effort normal and breath sounds normal. No respiratory distress. She has no wheezes.  Abdominal: Soft. Bowel sounds are normal. She exhibits no distension. There is no tenderness.  Musculoskeletal: Normal range of motion. She exhibits no edema or tenderness.  Neurological: She is alert and oriented to person, place, and time. She has normal reflexes. No cranial nerve deficit.  Skin: Skin is warm and dry.  Psychiatric: She has a normal mood and affect. Her behavior  is normal. Judgment and thought content normal.  Vitals reviewed.    BP 105/79 mmHg  Pulse 84  Temp(Src) 97 F (36.1 C) (Oral)  Ht 5' 1"  (1.549 m)  Wt 154 lb 12.8 oz (70.217 kg)  BMI 29.26 kg/m2     Assessment & Plan:  1. Type 2 diabetes mellitus with hyperglycemia, without long-term current use of insulin (HCC) - CMP14+EGFR - Bayer DCA Hb A1c Waived - Microalbumin / creatinine urine ratio  2. Gastroesophageal reflux disease without esophagitis - CMP14+EGFR  3. Migraine with aura and without status migrainosus, not intractable - CMP14+EGFR  4. GAD (generalized anxiety disorder) - CMP14+EGFR  5. Depression - CMP14+EGFR  6. Hyperlipidemia - CMP14+EGFR - Lipid panel  7. Overweight (BMI 25.0-29.9) - CMP14+EGFR   Continue all meds Labs pending Health Maintenance reviewed Diet and exercise encouraged RTO 3 months  Evelina Dun, FNP

## 2015-09-01 LAB — CMP14+EGFR
A/G RATIO: 1.7 (ref 1.2–2.2)
ALBUMIN: 3.8 g/dL (ref 3.5–5.5)
ALT: 9 IU/L (ref 0–32)
AST: 16 IU/L (ref 0–40)
Alkaline Phosphatase: 49 IU/L (ref 39–117)
BUN / CREAT RATIO: 18 (ref 9–23)
BUN: 12 mg/dL (ref 6–24)
CHLORIDE: 97 mmol/L (ref 96–106)
CO2: 24 mmol/L (ref 18–29)
Calcium: 9.5 mg/dL (ref 8.7–10.2)
Creatinine, Ser: 0.68 mg/dL (ref 0.57–1.00)
GFR calc non Af Amer: 99 mL/min/{1.73_m2} (ref >59)
GFR, EST AFRICAN AMERICAN: 114 mL/min/{1.73_m2} (ref >59)
Globulin, Total: 2.3 g/dL (ref 1.5–4.5)
Glucose: 128 mg/dL — ABNORMAL HIGH (ref 65–99)
POTASSIUM: 4.6 mmol/L (ref 3.5–5.2)
Sodium: 138 mmol/L (ref 134–144)
TOTAL PROTEIN: 6.1 g/dL (ref 6.0–8.5)

## 2015-09-01 LAB — LIPID PANEL
Chol/HDL Ratio: 1.9 ratio units (ref 0.0–4.4)
Cholesterol, Total: 156 mg/dL (ref 100–199)
HDL: 84 mg/dL (ref >39)
LDL Calculated: 53 mg/dL (ref 0–99)
Triglycerides: 94 mg/dL (ref 0–149)
VLDL Cholesterol Cal: 19 mg/dL (ref 5–40)

## 2015-09-01 LAB — MICROALBUMIN / CREATININE URINE RATIO
CREATININE, UR: 84.6 mg/dL
MICROALB/CREAT RATIO: 4.1 mg/g creat (ref 0.0–30.0)
MICROALBUM., U, RANDOM: 3.5 ug/mL

## 2015-09-14 ENCOUNTER — Other Ambulatory Visit: Payer: Self-pay | Admitting: Family

## 2015-09-14 ENCOUNTER — Encounter (HOSPITAL_COMMUNITY): Payer: Self-pay | Admitting: Psychiatry

## 2015-09-14 ENCOUNTER — Ambulatory Visit (INDEPENDENT_AMBULATORY_CARE_PROVIDER_SITE_OTHER): Payer: Medicaid Other | Admitting: Psychiatry

## 2015-09-14 VITALS — BP 110/80 | HR 82 | Ht 61.0 in | Wt 154.0 lb

## 2015-09-14 DIAGNOSIS — F909 Attention-deficit hyperactivity disorder, unspecified type: Secondary | ICD-10-CM | POA: Diagnosis not present

## 2015-09-14 DIAGNOSIS — F32A Depression, unspecified: Secondary | ICD-10-CM

## 2015-09-14 DIAGNOSIS — F4310 Post-traumatic stress disorder, unspecified: Secondary | ICD-10-CM

## 2015-09-14 DIAGNOSIS — F329 Major depressive disorder, single episode, unspecified: Secondary | ICD-10-CM | POA: Diagnosis not present

## 2015-09-14 MED ORDER — METHYLPHENIDATE HCL 10 MG PO TABS: 10.0000 mg | ORAL_TABLET | Freq: Two times a day (BID) | ORAL | Status: DC

## 2015-09-14 MED ORDER — VENLAFAXINE HCL ER 150 MG PO CP24: ORAL_CAPSULE | ORAL | Status: DC

## 2015-09-14 MED ORDER — ALPRAZOLAM 1 MG PO TABS: 1.0000 mg | ORAL_TABLET | Freq: Every evening | ORAL | Status: DC | PRN
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
## 2015-09-22 ENCOUNTER — Other Ambulatory Visit: Payer: Self-pay | Admitting: Family Medicine

## 2015-09-22 DIAGNOSIS — Z1231 Encounter for screening mammogram for malignant neoplasm of breast: Secondary | ICD-10-CM

## 2015-10-10 ENCOUNTER — Other Ambulatory Visit: Payer: Self-pay | Admitting: Family

## 2015-10-13 ENCOUNTER — Other Ambulatory Visit: Payer: Self-pay | Admitting: Family Medicine

## 2015-10-13 ENCOUNTER — Ambulatory Visit (HOSPITAL_COMMUNITY)
Admission: RE | Admit: 2015-10-13 | Discharge: 2015-10-13 | Disposition: A | Discharge status: Home / Self Care | Payer: Medicaid Other | Source: Ambulatory Visit | Attending: Family Medicine | Admitting: Family Medicine

## 2015-10-13 ENCOUNTER — Ambulatory Visit (HOSPITAL_COMMUNITY): Payer: Self-pay

## 2015-10-13 DIAGNOSIS — Z1231 Encounter for screening mammogram for malignant neoplasm of breast: Secondary | ICD-10-CM

## 2015-12-01 ENCOUNTER — Telehealth: Payer: Self-pay | Admitting: Family

## 2015-12-01 ENCOUNTER — Ambulatory Visit (INDEPENDENT_AMBULATORY_CARE_PROVIDER_SITE_OTHER): Payer: Medicaid Other | Admitting: Family

## 2015-12-01 ENCOUNTER — Encounter: Payer: Self-pay | Admitting: Family

## 2015-12-01 VITALS — BP 113/80 | HR 80 | Temp 98.2°F | Ht 61.0 in | Wt 154.2 lb

## 2015-12-01 DIAGNOSIS — F900 Attention-deficit hyperactivity disorder, predominantly inattentive type: Secondary | ICD-10-CM | POA: Diagnosis not present

## 2015-12-01 DIAGNOSIS — G43109 Migraine with aura, not intractable, without status migrainosus: Secondary | ICD-10-CM

## 2015-12-01 DIAGNOSIS — E1142 Type 2 diabetes mellitus with diabetic polyneuropathy: Secondary | ICD-10-CM | POA: Diagnosis not present

## 2015-12-01 DIAGNOSIS — F988 Other specified behavioral and emotional disorders with onset usually occurring in childhood and adolescence: Secondary | ICD-10-CM

## 2015-12-01 DIAGNOSIS — E663 Overweight: Secondary | ICD-10-CM | POA: Diagnosis not present

## 2015-12-01 DIAGNOSIS — F329 Major depressive disorder, single episode, unspecified: Secondary | ICD-10-CM | POA: Diagnosis not present

## 2015-12-01 DIAGNOSIS — F32A Depression, unspecified: Secondary | ICD-10-CM

## 2015-12-01 DIAGNOSIS — F411 Generalized anxiety disorder: Secondary | ICD-10-CM

## 2015-12-01 DIAGNOSIS — E785 Hyperlipidemia, unspecified: Secondary | ICD-10-CM

## 2015-12-01 DIAGNOSIS — M5441 Lumbago with sciatica, right side: Secondary | ICD-10-CM | POA: Diagnosis not present

## 2015-12-01 DIAGNOSIS — K219 Gastro-esophageal reflux disease without esophagitis: Secondary | ICD-10-CM

## 2015-12-01 LAB — BAYER DCA HB A1C WAIVED: HB A1C (BAYER DCA - WAIVED): 7 % — ABNORMAL HIGH (ref <7.0)

## 2015-12-01 MED ORDER — PREGABALIN 50 MG PO CAPS: 50.0000 mg | ORAL_CAPSULE | Freq: Three times a day (TID) | ORAL | 1 refills | Status: DC

## 2015-12-01 NOTE — Progress Notes (Signed)
Subjective:    Patient ID: Charlotte Perez, female    DOB: [birth year], 55 y.o.   MRN: [id number]  PT presents to the office today for chronic follow up. Pt is followed by Behavioral health for ADHD, GAD, and Depression.  Diabetes  She presents for her follow-up diabetic visit. She has type 2 diabetes mellitus. Her disease course has been fluctuating. Hypoglycemia symptoms include nervousness/anxiousness. Pertinent negatives for hypoglycemia include no confusion, headaches, mood changes, pallor or sleepiness. Associated symptoms include foot paresthesias. Pertinent negatives for diabetes include no blurred vision, no chest pain, no foot ulcerations and no visual change. Pertinent negatives for hypoglycemia complications include no blackouts and no hospitalization. Symptoms are worsening. Diabetic complications include peripheral neuropathy. Pertinent negatives for diabetic complications include no CVA, heart disease or nephropathy. Risk factors for coronary artery disease include diabetes mellitus, dyslipidemia, hypertension, stress, sedentary lifestyle and family history. Current diabetic treatment includes oral agent (triple therapy). She is compliant with treatment all of the time. She is following a generally healthy diet. Her breakfast blood glucose range is generally 140-180 mg/dl. An ACE inhibitor/angiotensin II receptor blocker is being taken. Eye exam is current.  Hyperlipidemia  This is a chronic problem. The current episode started more than 1 year ago. The problem is controlled. Recent lipid tests were reviewed and are normal. Exacerbating diseases include diabetes. Pertinent negatives include no chest pain, myalgias or shortness of breath. Current antihyperlipidemic treatment includes statins. The current treatment provides significant improvement of lipids. Risk factors for coronary artery disease include diabetes mellitus, dyslipidemia, family history, hypertension and post-menopausal.    Anxiety  Presents for follow-up visit. Onset was 1 to 6 months ago. The problem has been waxing and waning. Symptoms include depressed mood, excessive worry and nervous/anxious behavior. Patient reports no chest pain, confusion, irritability, palpitations, panic, restlessness, shortness of breath or suicidal ideas. Symptoms occur occasionally. The severity of symptoms is mild.   Her past medical history is significant for anxiety/panic attacks and depression. Past treatments include benzodiazephines and non-SSRI antidepressants. The treatment provided moderate relief. Compliance with prior treatments has been good.  Gastroesophageal Reflux  She reports no belching, no chest pain, no coughing or no heartburn. This is a chronic problem. The current episode started more than 1 year ago. The problem occurs rarely. The problem has been waxing and waning. The symptoms are aggravated by certain foods. She has tried a PPI for the symptoms. The treatment provided significant relief.  Depression       The patient presents with depression.  This is a chronic (Pt see's DR. Rudean Haskell at behavioral health ) problem.  The current episode started more than 1 year ago.   The onset quality is gradual.   The problem occurs rarely.  The problem has been waxing and waning since onset.  Associated symptoms include no helplessness, no hopelessness, no restlessness, no myalgias, no headaches, not sad and no suicidal ideas.  Past treatments include SNRIs - Serotonin and norepinephrine reuptake inhibitors.  Compliance with treatment is good.  Past medical history includes anxiety and depression.   Migraine   This is a chronic problem. The current episode started more than 1 year ago. Episode frequency: Pt states she had two last month. The problem has been waxing and waning. The pain is located in the bilateral and occipital region. The pain quality is similar to prior headaches. The pain is at a severity of 8/10. The pain is  moderate. Associated symptoms include back pain. Pertinent  negatives include no blurred vision, coughing or visual change. She has tried acetaminophen and ketorolac injections for the symptoms. The treatment provided moderate relief. Her past medical history is significant for migraine headaches.  Back Pain  This is a chronic problem. The current episode started more than 1 year ago. The problem occurs intermittently. The problem has been waxing and waning since onset. The pain is present in the lumbar spine. The quality of the pain is described as aching. The pain is at a severity of 10/10. The pain is moderate. The symptoms are aggravated by bending. Pertinent negatives include no chest pain or headaches. She has tried analgesics for the symptoms. The treatment provided mild relief.  Diabetic Neuropathy PT states she has bilateral numbness and burning pain of 6 out 10.  ADHD Pt currently taking Ritalin 10 mg. PT is followed by Dr, Harrington Challenger every 3 months.    Review of Systems  Constitutional: Negative.  Negative for irritability.  HENT: Negative.   Eyes: Negative.  Negative for blurred vision.  Respiratory: Negative.  Negative for cough and shortness of breath.   Cardiovascular: Negative.  Negative for chest pain and palpitations.  Gastrointestinal: Negative.  Negative for heartburn.  Endocrine: Negative.   Genitourinary: Negative.   Musculoskeletal: Positive for back pain. Negative for myalgias.  Skin: Negative for pallor.  Neurological: Negative.  Negative for headaches.  Hematological: Negative.   Psychiatric/Behavioral: Positive for depression. Negative for confusion and suicidal ideas. The patient is nervous/anxious.   All other systems reviewed and are negative.      Objective:   Physical Exam  Constitutional: She is oriented to person, place, and time. She appears well-developed and well-nourished. No distress.  HENT:  Head: Normocephalic and atraumatic.  Right Ear: External ear  normal.  Left Ear: External ear normal.  Nose: Nose normal.  Mouth/Throat: Oropharynx is clear and moist.  Eyes: Pupils are equal, round, and reactive to light.  Neck: Normal range of motion. Neck supple. No thyromegaly present.  Cardiovascular: Normal rate, regular rhythm, normal heart sounds and intact distal pulses.   No murmur heard. Pulmonary/Chest: Effort normal and breath sounds normal. No respiratory distress. She has no wheezes.  Abdominal: Soft. Bowel sounds are normal. She exhibits no distension. There is no tenderness.  Musculoskeletal: Normal range of motion. She exhibits tenderness (right lower lumbar). She exhibits no edema.  Neurological: She is alert and oriented to person, place, and time.  Skin: Skin is warm and dry.  Psychiatric: She has a normal mood and affect. Her behavior is normal. Judgment and thought content normal.  Vitals reviewed.    BP 113/80   Pulse 80   Temp 98.2 F (36.8 C) (Oral)   Ht 5' 1"  (1.549 m)   Wt 154 lb 3.2 oz (69.9 kg)   BMI 29.14 kg/m      Assessment & Plan:  1. Migraine with aura and without status migrainosus, not intractable - CMP14+EGFR  2. Gastroesophageal reflux disease without esophagitis - CMP14+EGFR  3. Type 2 diabetes mellitus with diabetic polyneuropathy, without long-term current use of insulin (HCC) - Bayer DCA Hb A1c Waived - CMP14+EGFR  4. ADD (attention deficit disorder) without hyperactivity - CMP14+EGFR  5. Depression - CMP14+EGFR  6. GAD (generalized anxiety disorder) - CMP14+EGFR  7. Hyperlipidemia - CMP14+EGFR  8. Overweight (BMI 25.0-29.9) - CMP14+EGFR  9. Diabetic peripheral neuropathy (HCC) -Pt started on lyrica 50 mg TID today3 - CMP14+EGFR - pregabalin (LYRICA) 50 MG capsule; Take 1 capsule (50 mg total)  by mouth 3 (three) times daily.  Dispense: 90 capsule; Refill: 1  10. Right-sided low back pain with sciatica, sciatica laterality unspecified - Ambulatory referral to Neurosurgery -  pregabalin (LYRICA) 50 MG capsule; Take 1 capsule (50 mg total) by mouth 3 (three) times daily.  Dispense: 90 capsule; Refill: 1   Continue all meds Labs pending Health Maintenance reviewed Diet and exercise encouraged RTO 3 months   Evelina Dun, FNP

## 2015-12-01 NOTE — Patient Instructions (Signed)

## 2015-12-02 LAB — CMP14+EGFR
ALK PHOS: 51 IU/L (ref 39–117)
ALT: 10 IU/L (ref 0–32)
AST: 12 IU/L (ref 0–40)
Albumin/Globulin Ratio: 1.6 (ref 1.2–2.2)
Albumin: 4.1 g/dL (ref 3.5–5.5)
BUN/Creatinine Ratio: 24 — ABNORMAL HIGH (ref 9–23)
BUN: 17 mg/dL (ref 6–24)
CHLORIDE: 99 mmol/L (ref 96–106)
CO2: 27 mmol/L (ref 18–29)
Calcium: 9.5 mg/dL (ref 8.7–10.2)
Creatinine, Ser: 0.7 mg/dL (ref 0.57–1.00)
GFR calc non Af Amer: 98 mL/min/{1.73_m2} (ref >59)
GFR, EST AFRICAN AMERICAN: 113 mL/min/{1.73_m2} (ref >59)
Globulin, Total: 2.5 g/dL (ref 1.5–4.5)
Glucose: 138 mg/dL — ABNORMAL HIGH (ref 65–99)
POTASSIUM: 5.1 mmol/L (ref 3.5–5.2)
Sodium: 140 mmol/L (ref 134–144)
TOTAL PROTEIN: 6.6 g/dL (ref 6.0–8.5)

## 2015-12-02 NOTE — Telephone Encounter (Signed)
Will work on this

## 2015-12-05 ENCOUNTER — Telehealth: Payer: Self-pay

## 2015-12-05 NOTE — Telephone Encounter (Signed)
Pt is allergic to gabapentin

## 2015-12-12 ENCOUNTER — Other Ambulatory Visit: Payer: Self-pay | Admitting: Family

## 2015-12-12 DIAGNOSIS — E1165 Type 2 diabetes mellitus with hyperglycemia: Secondary | ICD-10-CM

## 2015-12-14 ENCOUNTER — Telehealth: Payer: Self-pay | Admitting: Family

## 2015-12-14 NOTE — Telephone Encounter (Signed)
Patient aware that she may take lyrica once daily if this is helping her.

## 2015-12-15 ENCOUNTER — Ambulatory Visit (HOSPITAL_COMMUNITY): Payer: Self-pay | Admitting: Psychiatry

## 2015-12-18 ENCOUNTER — Ambulatory Visit (INDEPENDENT_AMBULATORY_CARE_PROVIDER_SITE_OTHER): Payer: Medicaid Other | Admitting: Psychiatry

## 2015-12-18 ENCOUNTER — Encounter (HOSPITAL_COMMUNITY): Payer: Self-pay | Admitting: Psychiatry

## 2015-12-18 VITALS — BP 109/69 | HR 95 | Ht 61.0 in | Wt 155.0 lb

## 2015-12-18 DIAGNOSIS — F988 Other specified behavioral and emotional disorders with onset usually occurring in childhood and adolescence: Secondary | ICD-10-CM

## 2015-12-18 DIAGNOSIS — F32A Depression, unspecified: Secondary | ICD-10-CM

## 2015-12-18 DIAGNOSIS — F329 Major depressive disorder, single episode, unspecified: Secondary | ICD-10-CM

## 2015-12-18 DIAGNOSIS — F900 Attention-deficit hyperactivity disorder, predominantly inattentive type: Secondary | ICD-10-CM

## 2015-12-18 MED ORDER — METHYLPHENIDATE HCL 10 MG PO TABS: 10.0000 mg | ORAL_TABLET | Freq: Two times a day (BID) | ORAL | 0 refills | Status: DC

## 2015-12-18 MED ORDER — ALPRAZOLAM 1 MG PO TABS: 1.0000 mg | ORAL_TABLET | Freq: Every evening | ORAL | 2 refills | Status: DC | PRN

## 2015-12-18 MED ORDER — VENLAFAXINE HCL ER 150 MG PO CP24
ORAL_CAPSULE | ORAL | 2 refills | Status: DC
Start: 2015-12-18 — End: 2016-03-12
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
## 2015-12-25 ENCOUNTER — Telehealth: Payer: Self-pay | Admitting: Family

## 2015-12-25 NOTE — Telephone Encounter (Signed)
Ok

## 2016-01-16 ENCOUNTER — Other Ambulatory Visit: Payer: Self-pay | Admitting: Family

## 2016-01-19 ENCOUNTER — Other Ambulatory Visit: Payer: Self-pay | Admitting: *Deleted

## 2016-01-19 MED ORDER — PREGABALIN 50 MG PO CAPS: 50.0000 mg | ORAL_CAPSULE | Freq: Three times a day (TID) | ORAL | 1 refills | Status: DC

## 2016-01-19 NOTE — Telephone Encounter (Signed)
Last filled 12/08/15, last seen 12/01/15. Route to pool for call in

## 2016-02-10 ENCOUNTER — Ambulatory Visit (INDEPENDENT_AMBULATORY_CARE_PROVIDER_SITE_OTHER): Payer: Medicaid Other

## 2016-02-10 DIAGNOSIS — Z23 Encounter for immunization: Secondary | ICD-10-CM | POA: Diagnosis not present

## 2016-03-05 ENCOUNTER — Ambulatory Visit (INDEPENDENT_AMBULATORY_CARE_PROVIDER_SITE_OTHER): Payer: Medicaid Other | Admitting: Family

## 2016-03-05 ENCOUNTER — Encounter: Payer: Self-pay | Admitting: Family

## 2016-03-05 VITALS — BP 106/70 | HR 84 | Temp 97.6°F | Ht 61.0 in | Wt 157.2 lb

## 2016-03-05 DIAGNOSIS — F411 Generalized anxiety disorder: Secondary | ICD-10-CM

## 2016-03-05 DIAGNOSIS — F988 Other specified behavioral and emotional disorders with onset usually occurring in childhood and adolescence: Secondary | ICD-10-CM | POA: Diagnosis not present

## 2016-03-05 DIAGNOSIS — K219 Gastro-esophageal reflux disease without esophagitis: Secondary | ICD-10-CM | POA: Diagnosis not present

## 2016-03-05 DIAGNOSIS — F331 Major depressive disorder, recurrent, moderate: Secondary | ICD-10-CM

## 2016-03-05 DIAGNOSIS — G43109 Migraine with aura, not intractable, without status migrainosus: Secondary | ICD-10-CM

## 2016-03-05 DIAGNOSIS — E663 Overweight: Secondary | ICD-10-CM

## 2016-03-05 DIAGNOSIS — E785 Hyperlipidemia, unspecified: Secondary | ICD-10-CM | POA: Diagnosis not present

## 2016-03-05 DIAGNOSIS — E1142 Type 2 diabetes mellitus with diabetic polyneuropathy: Secondary | ICD-10-CM | POA: Diagnosis not present

## 2016-03-05 LAB — CMP14+EGFR
ALBUMIN: 3.8 g/dL (ref 3.5–5.5)
ALT: 11 IU/L (ref 0–32)
AST: 12 IU/L (ref 0–40)
Albumin/Globulin Ratio: 1.4 (ref 1.2–2.2)
Alkaline Phosphatase: 56 IU/L (ref 39–117)
BUN / CREAT RATIO: 20 (ref 9–23)
BUN: 16 mg/dL (ref 6–24)
Bilirubin Total: <0.2 mg/dL (ref 0.0–1.2)
CALCIUM: 9.3 mg/dL (ref 8.7–10.2)
CHLORIDE: 100 mmol/L (ref 96–106)
CO2: 23 mmol/L (ref 18–29)
CREATININE: 0.81 mg/dL (ref 0.57–1.00)
GFR, EST AFRICAN AMERICAN: 94 mL/min/{1.73_m2} (ref >59)
GFR, EST NON AFRICAN AMERICAN: 81 mL/min/{1.73_m2} (ref >59)
GLUCOSE: 218 mg/dL — AB (ref 65–99)
Globulin, Total: 2.7 g/dL (ref 1.5–4.5)
Potassium: 5.1 mmol/L (ref 3.5–5.2)
Sodium: 140 mmol/L (ref 134–144)
TOTAL PROTEIN: 6.5 g/dL (ref 6.0–8.5)

## 2016-03-05 LAB — BAYER DCA HB A1C WAIVED: HB A1C: 7.3 % — AB (ref <7.0)

## 2016-03-05 NOTE — Progress Notes (Signed)
Subjective:    Patient ID: Charlotte Perez, female    DOB: 01-27-1961, 55 y.o.   MRN: [id number]  PT presents to the office today for chronic follow up. Pt is followed by Behavioral health for ADHD, GAD, and Depression.  Diabetes  She presents for her follow-up diabetic visit. She has type 2 diabetes mellitus. Her disease course has been fluctuating. Hypoglycemia symptoms include nervousness/anxiousness. Pertinent negatives for hypoglycemia include no confusion, headaches, mood changes, pallor or sleepiness. Associated symptoms include foot paresthesias. Pertinent negatives for diabetes include no blurred vision, no chest pain, no foot ulcerations and no visual change. Pertinent negatives for hypoglycemia complications include no blackouts and no hospitalization. Symptoms are worsening. Diabetic complications include peripheral neuropathy. Pertinent negatives for diabetic complications include no CVA, heart disease or nephropathy. Risk factors for coronary artery disease include diabetes mellitus, dyslipidemia, hypertension, stress, sedentary lifestyle and family history. Current diabetic treatment includes oral agent (triple therapy). She is compliant with treatment all of the time. She is following a generally healthy diet. Her breakfast blood glucose range is generally 140-180 mg/dl. An ACE inhibitor/angiotensin II receptor blocker is being taken. Eye exam is current.  Hyperlipidemia  This is a chronic problem. The current episode started more than 1 year ago. The problem is controlled. Recent lipid tests were reviewed and are normal. Exacerbating diseases include diabetes and obesity. Pertinent negatives include no chest pain, myalgias or shortness of breath. Current antihyperlipidemic treatment includes statins. The current treatment provides significant improvement of lipids. Risk factors for coronary artery disease include diabetes mellitus, dyslipidemia, family history, hypertension and  post-menopausal.  Anxiety  Presents for follow-up visit. Onset was 1 to 6 months ago. The problem has been waxing and waning. Symptoms include depressed mood, excessive worry and nervous/anxious behavior. Patient reports no chest pain, confusion, irritability, palpitations, panic, restlessness, shortness of breath or suicidal ideas. Symptoms occur occasionally. The severity of symptoms is mild.   Her past medical history is significant for anxiety/panic attacks and depression. Past treatments include benzodiazephines and non-SSRI antidepressants. The treatment provided moderate relief. Compliance with prior treatments has been good.  Gastroesophageal Reflux  She reports no belching, no chest pain, no coughing or no heartburn. This is a chronic problem. The current episode started more than 1 year ago. The problem occurs rarely. The problem has been waxing and waning. The symptoms are aggravated by certain foods. She has tried a PPI for the symptoms. The treatment provided significant relief.  Depression       The patient presents with depression.  This is a chronic (Pt see's DR. Rudean Haskell at behavioral health ) problem.  The current episode started more than 1 year ago.   The onset quality is gradual.   The problem occurs rarely.  The problem has been waxing and waning since onset.  Associated symptoms include sad.  Associated symptoms include no helplessness, no hopelessness, no restlessness, no myalgias, no headaches and no suicidal ideas.     The symptoms are aggravated by family issues.  Past treatments include SNRIs - Serotonin and norepinephrine reuptake inhibitors.  Compliance with treatment is good.  Past medical history includes anxiety and depression.   Migraine   This is a chronic problem. The current episode started more than 1 year ago. Episode frequency: Pt states she has not had a migraine in one month. The problem has been waxing and waning. The pain is located in the bilateral and  occipital region. The pain quality is similar to prior  headaches. The pain is at a severity of 8/10. The pain is moderate. Associated symptoms include back pain. Pertinent negatives include no blurred vision, coughing or visual change. She has tried acetaminophen and ketorolac injections for the symptoms. The treatment provided moderate relief. Her past medical history is significant for migraine headaches and obesity.  Back Pain  This is a chronic problem. The current episode started more than 1 year ago. The problem occurs intermittently. The problem has been waxing and waning since onset. The pain is present in the lumbar spine. The quality of the pain is described as aching. The pain is at a severity of 10/10. The pain is moderate. The symptoms are aggravated by bending. Pertinent negatives include no chest pain or headaches. She has tried analgesics for the symptoms. The treatment provided mild relief.  Diabetic Neuropathy PT states she has bilateral numbness and burning pain of 10 out 10.  ADHD Pt currently taking Ritalin 10 mg. PT is followed by Dr, Harrington Challenger every 3 months.    Review of Systems  Constitutional: Negative.  Negative for irritability.  HENT: Negative.   Eyes: Negative.  Negative for blurred vision.  Respiratory: Negative.  Negative for cough and shortness of breath.   Cardiovascular: Negative.  Negative for chest pain and palpitations.  Gastrointestinal: Negative.  Negative for heartburn.  Endocrine: Negative.   Genitourinary: Negative.   Musculoskeletal: Positive for back pain. Negative for myalgias.  Skin: Negative for pallor.  Neurological: Negative.  Negative for headaches.  Hematological: Negative.   Psychiatric/Behavioral: Positive for depression. Negative for confusion and suicidal ideas. The patient is nervous/anxious.   All other systems reviewed and are negative.      Objective:   Physical Exam  Constitutional: She is oriented to person, place, and time. She  appears well-developed and well-nourished. No distress.  HENT:  Head: Normocephalic and atraumatic.  Right Ear: External ear normal.  Left Ear: External ear normal.  Nose: Nose normal.  Mouth/Throat: Oropharynx is clear and moist.  Eyes: Pupils are equal, round, and reactive to light.  Neck: Normal range of motion. Neck supple. No thyromegaly present.  Cardiovascular: Normal rate, regular rhythm, normal heart sounds and intact distal pulses.   No murmur heard. Pulmonary/Chest: Effort normal and breath sounds normal. No respiratory distress. She has no wheezes.  Abdominal: Soft. Bowel sounds are normal. She exhibits no distension. There is no tenderness.  Musculoskeletal: Normal range of motion. She exhibits tenderness (right lower lumbar). She exhibits no edema.  Neurological: She is alert and oriented to person, place, and time.  Skin: Skin is warm and dry.  Psychiatric: She has a normal mood and affect. Her behavior is normal. Judgment and thought content normal.  Vitals reviewed.    BP 106/70   Pulse 84   Temp 97.6 F (36.4 C) (Oral)   Ht 5' 1"  (1.549 m)   Wt 157 lb 3.2 oz (71.3 kg)   BMI 29.70 kg/m      Assessment & Plan:  1. Migraine with aura and without status migrainosus, not intractable - CMP14+EGFR  2. Gastroesophageal reflux disease without esophagitis - CMP14+EGFR  3. Type 2 diabetes mellitus with diabetic polyneuropathy, without long-term current use of insulin (HCC) - Bayer DCA Hb A1c Waived - CMP14+EGFR  4. Diabetic peripheral neuropathy (HCC) - CMP14+EGFR  5. ADD (attention deficit disorder) without hyperactivity - CMP14+EGFR  6. Moderate episode of recurrent major depressive disorder (HCC)  - CMP14+EGFR  7. GAD (generalized anxiety disorder) - CMP14+EGFR  8. Hyperlipidemia,  unspecified hyperlipidemia type - CMP14+EGFR  9. Overweight (BMI 25.0-29.9) - CMP14+EGFR   Continue all meds Labs pending Health Maintenance reviewed Diet and  exercise encouraged RTO 3 months  Evelina Dun, FNP

## 2016-03-05 NOTE — Patient Instructions (Signed)
Diabetic Neuropathy Diabetic neuropathy is a nerve disease or nerve damage that is caused by diabetes mellitus. About half of all people with diabetes mellitus have some form of nerve damage. Nerve damage is more common in those who have had diabetes mellitus for many years and who generally have not had good control of their blood sugar (glucose) level. Diabetic neuropathy is a common complication of diabetes mellitus. There are three common types of diabetic neuropathy and a fourth type that is less common and less understood:  Peripheral neuropathy-This is the most common type of diabetic neuropathy. It causes damage to the nerves of the feet and legs first and then eventually the hands and arms. The damage affects the ability to sense touch.  Autonomic neuropathy-This type causes damage to the autonomic nervous system, which controls the following functions: ? Heartbeat. ? Body temperature. ? Blood pressure. ? Urination. ? Digestion. ? Sweating. ? Sexual function.  Focal neuropathy-Focal neuropathy can be painful and unpredictable and occurs most often in older adults with diabetes mellitus. It involves a specific nerve or one area and often comes on suddenly. It usually does not cause long-term problems.  Radiculoplexus neuropathy- Sometimes called lumbosacral radiculoplexus neuropathy, radiculoplexus neuropathy affects the nerves of the thighs, hips, buttocks, or legs. It is more common in people with type 2 diabetes mellitus and in older men. It is characterized by debilitating pain, weakness, and atrophy, usually in the thigh muscles.  What are the causes? The cause of peripheral, autonomic, and focal neuropathies is diabetes mellitus that is uncontrolled and high glucose levels. The cause of radiculoplexus neuropathy is unknown. However, it is thought to be caused by inflammation related to uncontrolled glucose levels. What are the signs or symptoms? Peripheral Neuropathy Peripheral  neuropathy develops slowly over time. When the nerves of the feet and legs no longer work there may be:  Burning, stabbing, or aching pain in the legs or feet.  Inability to feel pressure or pain in your feet. This can lead to: ? Thick calluses over pressure areas. ? Pressure sores. ? Ulcers.  Foot deformities.  Reduced ability to feel temperature changes.  Muscle weakness.  Autonomic Neuropathy The symptoms of autonomic neuropathy vary depending on which nerves are affected. Symptoms may include:  Problems with digestion, such as: ? Feeling sick to your stomach (nausea). ? Vomiting. ? Bloating. ? Constipation. ? Diarrhea. ? Abdominal pain.  Difficulty with urination. This occurs if you lose your ability to sense when your bladder is full. Problems include: ? Urine leakage (incontinence). ? Inability to empty your bladder completely (retention).  Rapid or irregular heartbeat (palpitations).  Blood pressure drops when you stand up (orthostatic hypotension). When you stand up you may feel: ? Dizzy. ? Weak. ? Faint.  In men, inability to attain and maintain an erection.  In women, vaginal dryness and problems with decreased sexual desire and arousal.  Problems with body temperature regulation.  Increased or decreased sweating.  Focal Neuropathy  Abnormal eye movements or abnormal alignment of both eyes.  Weakness in the wrist.  Foot drop. This results in an inability to lift the foot properly and abnormal walking or foot movement.  Paralysis on one side of your face (Bell palsy).  Chest or abdominal pain. Radiculoplexus Neuropathy  Sudden, severe pain in your hip, thigh, or buttocks.  Weakness and wasting of thigh muscles.  Difficulty rising from a seated position.  Abdominal swelling.  Unexplained weight loss (usually more than 10 lb [4.5 kg]). How is   this diagnosed? Peripheral Neuropathy Your senses may be tested. Sensory function testing can be  done with:  A light touch using a monofilament.  A vibration with tuning fork.  A sharp sensation with a pin prick.  Other tests that can help diagnose neuropathy are:  Nerve conduction velocity. This test checks the transmission of an electrical current through a nerve.  Electromyography. This shows how muscles respond to electrical signals transmitted by nearby nerves.  Quantitative sensory testing. This is used to assess how your nerves respond to vibrations and changes in temperature.  Autonomic Neuropathy Diagnosis is often based on reported symptoms. Tell your health care provider if you experience:  Dizziness.  Constipation.  Diarrhea.  Inappropriate urination or inability to urinate.  Inability to get or maintain an erection.  Tests that may be done include:  Electrocardiography or Holter monitor. These are tests that can help show problems with the heart rate or heart rhythm.  An X-ray exam may be done.  Focal Neuropathy Diagnosis is made based on your symptoms and what your health care provider finds during your exam. Other tests may be done. They may include:  Nerve conduction velocities. This checks the transmission of electrical current through a nerve.  Electromyography. This shows how muscles respond to electrical signals transmitted by nearby nerves.  Quantitative sensory testing. This test is used to assess how your nerves respond to vibration and changes in temperature.  Radiculoplexus Neuropathy  Often the first thing is to eliminate any other issue or problems that might be the cause, as there is no standard test for diagnosis.  X-ray exam of your spine and lumbar region.  Spinal tap to rule out cancer.  MRI to rule out other lesions. How is this treated? Once nerve damage occurs, it cannot be reversed. The goal of treatment is to keep the disease or nerve damage from getting worse and affecting more nerve fibers. Controlling your blood  glucose level is the key. Most people with radiculoplexus neuropathy see at least a partial improvement over time. You will need to keep your blood glucose and HbA1c levels in the target range determined by your health care provider. Things that help control blood glucose levels include:  Blood glucose monitoring.  Meal planning.  Physical activity.  Diabetes medicine.  Over time, maintaining lower blood glucose levels helps lessen symptoms. Sometimes, prescription pain medicine is needed. Follow these instructions at home:  Do not smoke.  Keep your blood glucose level in the range that you and your health care provider have determined acceptable for you.  Keep your blood pressure level in the range that you and your health care provider have determined acceptable for you.  Eat a well-balanced diet.  Be physically active every day. Include strength training and balance exercises.  Protect your feet. ? Check your feet every day for sores, cuts, blisters, or signs of infection. ? Wear padded socks and supportive shoes. Use orthotic inserts, if necessary. ? Regularly check the insides of your shoes for worn spots. Make sure there are no rocks or other items inside your shoes before you put them on. Contact a health care provider if:  You have burning, stabbing, or aching pain in the legs or feet.  You are unable to feel pressure or pain in your feet.  You develop problems with digestion such as: ? Nausea. ? Vomiting. ? Bloating. ? Constipation. ? Diarrhea. ? Abdominal pain.  You have difficulty with urination, such as: ? Incontinence. ? Retention.    You have palpitations.  You develop orthostatic hypotension. When you stand up you may feel: ? Dizzy. ? Weak. ? Faint.  You cannot attain and maintain an erection (in men).  You have vaginal dryness and problems with decreased sexual desire and arousal (in women).  You have severe pain in your thighs, legs, or  buttocks.  You have unexplained weight loss. This information is not intended to replace advice given to you by your health care provider. Make sure you discuss any questions you have with your health care provider. Document Released: 05/20/2001 Document Revised: 08/17/2015 Document Reviewed: 08/20/2012 Elsevier Interactive Patient Education  2017 Elsevier Inc.  

## 2016-03-06 ENCOUNTER — Other Ambulatory Visit: Payer: Self-pay | Admitting: Family

## 2016-03-06 MED ORDER — EMPAGLIFLOZIN 10 MG PO TABS: 10.0000 mg | ORAL_TABLET | Freq: Every day | ORAL | 3 refills | Status: DC

## 2016-03-12 ENCOUNTER — Encounter (HOSPITAL_COMMUNITY): Payer: Self-pay | Admitting: Psychiatry

## 2016-03-12 ENCOUNTER — Other Ambulatory Visit: Payer: Self-pay | Admitting: Family

## 2016-03-12 ENCOUNTER — Ambulatory Visit (INDEPENDENT_AMBULATORY_CARE_PROVIDER_SITE_OTHER): Payer: Medicaid Other | Admitting: Psychiatry

## 2016-03-12 VITALS — BP 117/82 | HR 85 | Ht 61.0 in | Wt 158.4 lb

## 2016-03-12 DIAGNOSIS — Z8249 Family history of ischemic heart disease and other diseases of the circulatory system: Secondary | ICD-10-CM | POA: Diagnosis not present

## 2016-03-12 DIAGNOSIS — Z800 Family history of malignant neoplasm of digestive organs: Secondary | ICD-10-CM

## 2016-03-12 DIAGNOSIS — Z833 Family history of diabetes mellitus: Secondary | ICD-10-CM

## 2016-03-12 DIAGNOSIS — Z79899 Other long term (current) drug therapy: Secondary | ICD-10-CM

## 2016-03-12 DIAGNOSIS — F321 Major depressive disorder, single episode, moderate: Secondary | ICD-10-CM

## 2016-03-12 DIAGNOSIS — Z8261 Family history of arthritis: Secondary | ICD-10-CM

## 2016-03-12 DIAGNOSIS — Z888 Allergy status to other drugs, medicaments and biological substances status: Secondary | ICD-10-CM

## 2016-03-12 DIAGNOSIS — Z811 Family history of alcohol abuse and dependence: Secondary | ICD-10-CM

## 2016-03-12 DIAGNOSIS — F988 Other specified behavioral and emotional disorders with onset usually occurring in childhood and adolescence: Secondary | ICD-10-CM

## 2016-03-12 DIAGNOSIS — Z818 Family history of other mental and behavioral disorders: Secondary | ICD-10-CM

## 2016-03-12 MED ORDER — METHYLPHENIDATE HCL 20 MG PO TABS: 20.0000 mg | ORAL_TABLET | Freq: Two times a day (BID) | ORAL | 0 refills | Status: DC

## 2016-03-12 MED ORDER — ALPRAZOLAM 1 MG PO TABS: 1.0000 mg | ORAL_TABLET | Freq: Every evening | ORAL | 2 refills | Status: DC | PRN

## 2016-03-12 MED ORDER — VENLAFAXINE HCL ER 150 MG PO CP24: ORAL_CAPSULE | ORAL | 2 refills | Status: DC
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
## 2016-04-11 ENCOUNTER — Other Ambulatory Visit: Payer: Self-pay | Admitting: Family

## 2016-04-11 DIAGNOSIS — E1165 Type 2 diabetes mellitus with hyperglycemia: Secondary | ICD-10-CM

## 2016-04-12 ENCOUNTER — Emergency Department (HOSPITAL_COMMUNITY)
Admission: EM | Admit: 2016-04-12 | Discharge: 2016-04-12 | Disposition: A | Discharge status: Home / Self Care | Payer: Medicaid Other | Attending: Emergency Medicine | Admitting: Emergency Medicine

## 2016-04-12 ENCOUNTER — Encounter (HOSPITAL_COMMUNITY): Payer: Self-pay | Admitting: Adult Health

## 2016-04-12 ENCOUNTER — Emergency Department (HOSPITAL_COMMUNITY): Payer: Medicaid Other

## 2016-04-12 DIAGNOSIS — E119 Type 2 diabetes mellitus without complications: Secondary | ICD-10-CM | POA: Diagnosis not present

## 2016-04-12 DIAGNOSIS — Z791 Long term (current) use of non-steroidal anti-inflammatories (NSAID): Secondary | ICD-10-CM | POA: Insufficient documentation

## 2016-04-12 DIAGNOSIS — Z7722 Contact with and (suspected) exposure to environmental tobacco smoke (acute) (chronic): Secondary | ICD-10-CM | POA: Insufficient documentation

## 2016-04-12 DIAGNOSIS — M545 Low back pain, unspecified: Secondary | ICD-10-CM

## 2016-04-12 DIAGNOSIS — W010XXA Fall on same level from slipping, tripping and stumbling without subsequent striking against object, initial encounter: Secondary | ICD-10-CM | POA: Insufficient documentation

## 2016-04-12 DIAGNOSIS — Y999 Unspecified external cause status: Secondary | ICD-10-CM | POA: Insufficient documentation

## 2016-04-12 DIAGNOSIS — Y929 Unspecified place or not applicable: Secondary | ICD-10-CM | POA: Insufficient documentation

## 2016-04-12 DIAGNOSIS — Z79899 Other long term (current) drug therapy: Secondary | ICD-10-CM | POA: Diagnosis not present

## 2016-04-12 DIAGNOSIS — M25551 Pain in right hip: Secondary | ICD-10-CM | POA: Diagnosis not present

## 2016-04-12 DIAGNOSIS — J449 Chronic obstructive pulmonary disease, unspecified: Secondary | ICD-10-CM | POA: Diagnosis not present

## 2016-04-12 DIAGNOSIS — J45909 Unspecified asthma, uncomplicated: Secondary | ICD-10-CM | POA: Insufficient documentation

## 2016-04-12 DIAGNOSIS — I10 Essential (primary) hypertension: Secondary | ICD-10-CM | POA: Diagnosis not present

## 2016-04-12 DIAGNOSIS — S79911A Unspecified injury of right hip, initial encounter: Secondary | ICD-10-CM | POA: Diagnosis present

## 2016-04-12 DIAGNOSIS — Y9389 Activity, other specified: Secondary | ICD-10-CM | POA: Insufficient documentation

## 2016-04-12 DIAGNOSIS — Z7984 Long term (current) use of oral hypoglycemic drugs: Secondary | ICD-10-CM | POA: Insufficient documentation

## 2016-04-12 MED ORDER — CYCLOBENZAPRINE HCL 10 MG PO TABS
10.0000 mg | ORAL_TABLET | Freq: Two times a day (BID) | ORAL | 0 refills | Status: DC | PRN
Start: 2016-04-12 — End: 2017-02-11

## 2016-04-12 MED ORDER — KETOROLAC TROMETHAMINE 60 MG/2ML IM SOLN
60.0000 mg | Freq: Once | INTRAMUSCULAR | Status: AC
  Administered 2016-04-12: 60 mg via INTRAMUSCULAR
  Filled 2016-04-12: qty 2

## 2016-04-12 MED ORDER — IBUPROFEN 800 MG PO TABS: 800.0000 mg | ORAL_TABLET | Freq: Three times a day (TID) | ORAL | 0 refills | Status: DC

## 2016-04-12 NOTE — ED Triage Notes (Signed)
Presents post fall on Wednesday while cutting wood, tripped and fell into some brush landing on right side of back. HX of T12 surgery 8 years ago. Today having pain with walking, standing and trying to move. Pain radiates from lower lumbar on the right side to right front of groin. Taking tylenol and Ibuprofen without relief, also using muscle creams with out relief. also injured right cheek. And briar scratch on left cheek. CMS intact.

## 2016-04-12 NOTE — ED Provider Notes (Signed)
[facility] DEPT Provider Note   CSN: [id number] Arrival date & time: 04/12/16  1050     History   Chief Complaint Chief Complaint  Patient presents with  . Fall  . Back Pain    HPI Charlotte Perez is a 56 y.o. female with history of degenerative disc disease, chronic back pain who presents following a fall 2 days ago with back pain and right hip pain. Patient reports tripping while cutting wood. Patient denies hitting her head or losing consciousness. Patient has had back pain and right hip pain, worse with movement and walking since the fall. Patient has history of back surgery to her T12 and disc disease. Patient has been taking Tylenol and using a heating pad and stretches at home without relief. Patient has been ambulatory with pain. Patient denies any fevers, weight loss, night sweats, saddle anesthesia, bowel/bladder incontinence, urinary symptoms, history of IVDU, chest pain, shortness of breath, abdominal pain, nausea, vomiting.  HPI  Past Medical History:  Diagnosis Date  . Arthritis   . Asthma   . Bipolar disorder ([place])   . Chronic back pain   . Chronic neck pain   . COPD (chronic obstructive pulmonary disease) ([place])   . DDD (degenerative disc disease), cervical   . DDD (degenerative disc disease), lumbar   . Depression   . Diabetes mellitus without complication ([place])   . Diabetes mellitus, type II ([place])   . Gallbladder sludge   . GERD (gastroesophageal reflux disease)   . Hyperlipidemia   . Hypertension   . Lumbar radiculopathy   . Mole (skin)    pt. thinks its a tick   . Neuropathy of foot    Feet   . Renal cyst   . Shingles   . Skin lesions, generalized    1.2 CM FLAT FAWN COLOR AT THE T10 AREA JUST RIGHT OF SPINE     Patient Active Problem List   Diagnosis Date Noted  . Diabetic peripheral neuropathy ([place]) 12/01/2015  . Overweight (BMI 25.0-29.9) 08/31/2015  . Migraine 12/26/2014  . GAD (generalized anxiety disorder) 10/25/2013  . GERD  (gastroesophageal reflux disease) 10/25/2013  . Depression 12/29/2012  . 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 Diabetes mellitus ([place]) 06/27/2010  . Hyperlipidemia 06/27/2010    Past Surgical History:  Procedure Laterality Date  . ABDOMINAL HYSTERECTOMY    . BACK SURGERY    . CESAREAN SECTION     x 2  . NECK SURGERY      OB History    No data available       Home Medications    Prior to Admission medications   Medication Sig Start Date End Date Taking? Authorizing Provider  ALPRAZolam Duanne Moron) 1 MG tablet Take 1 tablet (1 mg total) by mouth at bedtime as needed for anxiety. 03/12/16 03/12/17 Yes Cloria Spring, MD  empagliflozin (JARDIANCE) 10 MG TABS tablet Take 10 mg by mouth daily. 03/06/16  Yes Sharion Balloon, FNP  estradiol (ESTRACE) 0.5 MG tablet TAKE ONE TABLET BY MOUTH TWICE DAILY 12/12/15  Yes Sharion Balloon, FNP  lisinopril (PRINIVIL,ZESTRIL) 10 MG tablet TAKE ONE (1) TABLET EACH DAY 01/17/16  Yes Sharion Balloon, FNP  metFORMIN (GLUCOPHAGE) 1000 MG tablet TAKE ONE TABLET TWICE A DAY WITH FOOD 04/11/16  Yes Sharion Balloon, FNP  methylphenidate (RITALIN) 20 MG tablet Take 1 tablet (20 mg total) by mouth 2 (two) times daily with breakfast and lunch. 03/12/16 03/12/17 Yes Cloria Spring,  MD  omeprazole (PRILOSEC) 20 MG capsule TAKE ONE (1) CAPSULE EACH DAY 12/12/15  Yes Sharion Balloon, FNP  pioglitazone (ACTOS) 15 MG tablet TAKE ONE (1) TABLET EACH DAY 03/13/16  Yes Sharion Balloon, FNP  simvastatin (ZOCOR) 20 MG tablet TAKE ONE TABLET DAILY AT BEDTIME 04/11/16  Yes Sharion Balloon, FNP  venlafaxine XR (EFFEXOR-XR) 150 MG 24 hr capsule Take 2 in the pm Patient taking differently: Take 300 mg by mouth at bedtime. Take 2 in the pm 03/12/16  Yes Cloria Spring, MD  ACCU-CHEK AVIVA PLUS test strip EVERY DAY 12/12/15   Sharion Balloon, FNP  cyclobenzaprine (FLEXERIL) 10 MG tablet Take 1 tablet (10 mg total) by mouth 2 (two) times daily as needed  for muscle spasms. 04/12/16   Frederica Kuster, PA-C  ibuprofen (ADVIL,MOTRIN) 800 MG tablet Take 1 tablet (800 mg total) by mouth 3 (three) times daily. 04/12/16   Frederica Kuster, PA-C  meloxicam (MOBIC) 15 MG tablet TAKE ONE (1) TABLET EACH DAY Patient not taking: Reported on 04/12/2016 03/13/16   Sharion Balloon, FNP  methylphenidate (RITALIN) 10 MG tablet Take 1 tablet (10 mg total) by mouth 2 (two) times daily with breakfast and lunch. Patient not taking: Reported on 04/12/2016 12/18/15 12/17/16  Cloria Spring, MD  methylphenidate (RITALIN) 20 MG tablet Take 1 tablet (20 mg total) by mouth 2 (two) times daily with breakfast and lunch. Patient not taking: Reported on 04/12/2016 03/12/16 03/12/17  Cloria Spring, MD  methylphenidate (RITALIN) 20 MG tablet Take 1 tablet (20 mg total) by mouth 2 (two) times daily with breakfast and lunch. Patient not taking: Reported on 04/12/2016 03/12/16 03/12/17  Cloria Spring, MD    Family History Family History  Problem Relation Age of Onset  . ADD / ADHD Other   . COPD Father   . Alcohol abuse Father   . Depression Daughter   . Alcohol abuse Mother   . COPD Mother     on oxygen  . Colon cancer Paternal Grandfather   . Heart disease Sister   . Heart disease Brother   . Arthritis Sister     knee replacement  . Diabetes Maternal Grandmother   . Diabetes Sister     Social History Social History  Substance Use Topics  . Smoking status: Passive Smoke Exposure - Never Smoker  . Smokeless tobacco: Never Used  . Alcohol use No     Allergies   Cymbalta [duloxetine hcl]; Gabapentin; Meloxicam; and Tramadol   Review of Systems Review of Systems  Constitutional: Negative for chills and fever.  HENT: Negative for facial swelling and sore throat.   Respiratory: Negative for shortness of breath.   Cardiovascular: Negative for chest pain.  Gastrointestinal: Negative for abdominal pain, nausea and vomiting.  Genitourinary: Negative for dysuria.    Musculoskeletal: Positive for arthralgias (R hip) and back pain.  Skin: Negative for rash and wound.  Neurological: Negative for headaches.  Psychiatric/Behavioral: The patient is not nervous/anxious.      Physical Exam Updated Vital Signs BP 106/63 (BP Location: Right Arm)   Pulse 88   Temp 97.8 F (36.6 C) (Oral)   Resp 18   Ht 5\' 1"  (1.549 m)   Wt 71.7 kg   SpO2 97%   BMI 29.85 kg/m   Physical Exam  Constitutional: She appears well-developed and well-nourished. No distress.  HENT:  Head: Normocephalic and atraumatic.  Mouth/Throat: Oropharynx is clear and moist. No oropharyngeal exudate.  Eyes: Conjunctivae are normal. Pupils are equal, round, and reactive to light. Right eye exhibits no discharge. Left eye exhibits no discharge. No scleral icterus.  Neck: Normal range of motion. Neck supple. No thyromegaly present.  Cardiovascular: Normal rate, regular rhythm, normal heart sounds and intact distal pulses.  Exam reveals no gallop and no friction rub.   No murmur heard. Pulmonary/Chest: Effort normal and breath sounds normal. No stridor. No respiratory distress. She has no wheezes. She has no rales.  Abdominal: Soft. Bowel sounds are normal. She exhibits no distension. There is no tenderness. There is no rebound and no guarding.  Musculoskeletal: She exhibits no edema.       Right hip: She exhibits tenderness and bony tenderness.       Back:       Legs: Lymphadenopathy:    She has no cervical adenopathy.  Neurological: She is alert. Coordination normal.  Reflex Scores:      Patellar reflexes are 2+ on the right side and 2+ on the left side. Normal sensation and 5/5 strength to lower extremities  Skin: Skin is warm and dry. No rash noted. She is not diaphoretic. No pallor.  Psychiatric: She has a normal mood and affect.  Nursing note and vitals reviewed.    ED Treatments / Results  Labs (all labs ordered are listed, but only abnormal results are displayed) Labs  Reviewed - No data to display  EKG  EKG Interpretation None       Radiology Dg Thoracic Spine 2 View  Result Date: 04/12/2016 CLINICAL DATA:  Fall and back pain. EXAM: THORACIC SPINE 2 VIEWS COMPARISON:  12/26/2014 FINDINGS: Surgical plate in the lower cervical spine. Normal alignment of the thoracic spine. Mild degenerative endplate changes. Vertebral body heights are maintained. IMPRESSION: No acute abnormality. Mild degenerative changes. Electronically Signed   By: Markus Daft M.D.   On: 04/12/2016 13:50   Dg Lumbar Spine Complete  Result Date: 04/12/2016 CLINICAL DATA:  Right hip pain, back pain EXAM: LUMBAR SPINE - COMPLETE 4+ VIEW COMPARISON:  None. FINDINGS: There are 5 nonrib bearing lumbar-type vertebral bodies. The vertebral body heights are maintained. The alignment is anatomic. There is no static listhesis. There is no spondylolysis. There is no acute fracture. There is degenerative disc disease with disc height loss at L2-3, L3-4 and L4-5. There is posterior lumbar interbody fusion at L5-S1 without failure or complication. The SI joints are unremarkable. IMPRESSION: 1. Lumbar spine spondylosis. 2. Posterior lumbar interbody fusion at L5-S1. Electronically Signed   By: Kathreen Devoid   On: 04/12/2016 13:48   Dg Hip Unilat W Or Wo Pelvis 2-3 Views Right  Result Date: 04/12/2016 CLINICAL DATA:  Fall, right hip pain and back pain. EXAM: DG HIP (WITH OR WITHOUT PELVIS) 2-3V RIGHT COMPARISON:  None. FINDINGS: Single view of the pelvis and two views of the right hip are provided no osseous fracture or dislocation seen. Fixation hardware noted at the lumbosacral junction, grossly intact and appropriately positioned. Soft tissues about the pelvis and right hip are unremarkable. IMPRESSION: No acute findings. Electronically Signed   By: Franki Cabot M.D.   On: 04/12/2016 13:48    Procedures Procedures (including critical care time)  Medications Ordered in ED Medications  ketorolac  (TORADOL) injection 60 mg (60 mg Intramuscular Given 04/12/16 1506)     Initial Impression / Assessment and Plan / ED Course  I have reviewed the triage vital signs and the nursing notes.  Pertinent labs & imaging results  that were available during my care of the patient were reviewed by me and considered in my medical decision making (see chart for details).     Patient with back pain. Thoracic x-ray shows mild degenerative changes. Lumbar x-ray shows lumbar spine spondylosis and posterior lumbar interbody fusion at L5-S1. Right hip x-ray negative. No neurological deficits and normal neuro exam.  Patient is ambulatory.  No loss of bowel or bladder control.  No concern for cauda equina.  No fever, night sweats, weight loss, h/o cancer, IVDA, no recent procedure to back. No urinary symptoms suggestive of UTI.  Supportive care discussed. Discharged home with ibuprofen and Flexeril. Patient is to follow up with primary care provider, and orthopedic doctor at her scheduled appointment next month. Return precautions discussed. Patient understands and agrees with plan. Patient vitals stable throughout ED course and discharged in satisfactory condition.   Final Clinical Impressions(s) / ED Diagnoses   Final diagnoses:  Acute right-sided low back pain without sciatica  Right hip pain    New Prescriptions Discharge Medication List as of 04/12/2016  2:50 PM    START taking these medications   Details  cyclobenzaprine (FLEXERIL) 10 MG tablet Take 1 tablet (10 mg total) by mouth 2 (two) times daily as needed for muscle spasms., Starting Fri 04/12/2016, Print    ibuprofen (ADVIL,MOTRIN) 800 MG tablet Take 1 tablet (800 mg total) by mouth 3 (three) times daily., Starting Fri 04/12/2016, Print         Frederica Kuster, PA-C 04/12/16 [place], DO 04/14/16 1831

## 2016-04-12 NOTE — Discharge Instructions (Signed)
Medications: Ibuprofen, Flexeril  Treatment: Take ibuprofen every 8 hours for your pain. Take Flexeril twice daily as needed for muscle pain and spasms. Do not drive or operate machinery when taking this medication. Use a heating pad on your back 3-4 times daily alternating 20 minutes on, 20 minutes off. Attempt the stretches attached as tolerated 1-2 times daily.  Follow-up: Please follow-up with your primary care provider and Dr. Rolena Infante for further evaluation and treatment of your back pain. Please return to the emergency department if you develop any new or worsening symptoms.

## 2016-05-01 LAB — HM DIABETES EYE EXAM

## 2016-05-10 ENCOUNTER — Ambulatory Visit (INDEPENDENT_AMBULATORY_CARE_PROVIDER_SITE_OTHER): Payer: Medicaid Other | Admitting: Family Medicine

## 2016-05-10 ENCOUNTER — Telehealth: Payer: Self-pay | Admitting: Family

## 2016-05-10 VITALS — BP 91/63 | HR 91 | Temp 97.2°F | Ht 61.0 in | Wt 155.0 lb

## 2016-05-10 DIAGNOSIS — J200 Acute bronchitis due to Mycoplasma pneumoniae: Secondary | ICD-10-CM

## 2016-05-10 MED ORDER — AZITHROMYCIN 250 MG PO TABS: ORAL_TABLET | ORAL | 0 refills | Status: DC

## 2016-05-10 NOTE — Telephone Encounter (Signed)
appt scheduled

## 2016-05-10 NOTE — Telephone Encounter (Signed)
Pt c/o cough, sneezing and drainage Pt has sore throat and ear pain

## 2016-05-10 NOTE — Patient Instructions (Signed)
Great to see you!  Be sure to finish all antibiotics   Acute Bronchitis, Adult Acute bronchitis is when air tubes (bronchi) in the lungs suddenly get swollen. The condition can make it hard to breathe. It can also cause these symptoms:  A cough.  Coughing up clear, yellow, or green mucus.  Wheezing.  Chest congestion.  Shortness of breath.  A fever.  Body aches.  Chills.  A sore throat. Follow these instructions at home: Medicines  Take over-the-counter and prescription medicines only as told by your doctor.  If you were prescribed an antibiotic medicine, take it as told by your doctor. Do not stop taking the antibiotic even if you start to feel better. General instructions  Rest.  Drink enough fluids to keep your pee (urine) clear or pale yellow.  Avoid smoking and secondhand smoke. If you smoke and you need help quitting, ask your doctor. Quitting will help your lungs heal faster.  Use an inhaler, cool mist vaporizer, or humidifier as told by your doctor.  Keep all follow-up visits as told by your doctor. This is important. How is this prevented? To lower your risk of getting this condition again:  Wash your hands often with soap and water. If you cannot use soap and water, use hand sanitizer.  Avoid contact with people who have cold symptoms.  Try not to touch your hands to your mouth, nose, or eyes.  Make sure to get the flu shot every year. Contact a doctor if:  Your symptoms do not get better in 2 weeks. Get help right away if:  You cough up blood.  You have chest pain.  You have very bad shortness of breath.  You become dehydrated.  You faint (pass out) or keep feeling like you are going to pass out.  You keep throwing up (vomiting).  You have a very bad headache.  Your fever or chills gets worse. This information is not intended to replace advice given to you by your health care provider. Make sure you discuss any questions you have with  your health care provider. Document Released: 08/28/2007 Document Revised: 10/18/2015 Document Reviewed: 08/30/2015 Elsevier Interactive Patient Education  2017 [employer].

## 2016-05-10 NOTE — Progress Notes (Signed)
   HPI  Patient presents today here with acute illness.  Patient complains of hoarseness, coughing, sneezing, and intermittent ear pain for the last 3 days.  She has mild dyspnea. Her cough is nonproductive.  She's tolerating food and fluids normally.  Her husband has a similar illness.  Patient states temperature measured 101 otic thermometer before she came here,. NO body aches   PMH: Smoking status noted ROS: Per HPI  Objective: BP 91/63   Pulse 91   Temp 97.2 F (36.2 C) (Oral)   Ht 5\' 1"  (1.549 m)   Wt 155 lb (70.3 kg)   BMI 29.29 kg/m  Gen: NAD, alert, cooperative with exam HEENT: NCAT, EOMI, PERRL CV: RRR, good S1/S2, no murmur Resp: Scattered coarse breath sounds, nonlabored, good air movement Ext: No edema, warm Neuro: Alert and oriented, No gross deficits  Assessment and plan:  # Acute bronchitis Treat with azithromycin Discussed supportive care and usual course of illness RTC with any concerns    Meds ordered this encounter  Medications  . azithromycin (ZITHROMAX) 250 MG tablet    Sig: Take 2 tablets on day 1 and 1 tablet daily after that    Dispense:  6 tablet    Refill:  0    Laroy Apple, MD [facility] 05/10/2016, 6:06 PM

## 2016-05-26 ENCOUNTER — Encounter (HOSPITAL_COMMUNITY): Payer: Self-pay | Admitting: Emergency Medicine

## 2016-05-26 ENCOUNTER — Emergency Department (HOSPITAL_COMMUNITY)
Admission: EM | Admit: 2016-05-26 | Discharge: 2016-05-26 | Disposition: A | Discharge status: Home / Self Care | Payer: Medicaid Other | Attending: Emergency Medicine | Admitting: Emergency Medicine

## 2016-05-26 ENCOUNTER — Emergency Department (HOSPITAL_COMMUNITY): Payer: Medicaid Other

## 2016-05-26 DIAGNOSIS — Z7984 Long term (current) use of oral hypoglycemic drugs: Secondary | ICD-10-CM | POA: Diagnosis not present

## 2016-05-26 DIAGNOSIS — I10 Essential (primary) hypertension: Secondary | ICD-10-CM | POA: Diagnosis not present

## 2016-05-26 DIAGNOSIS — M545 Low back pain, unspecified: Secondary | ICD-10-CM

## 2016-05-26 DIAGNOSIS — Y999 Unspecified external cause status: Secondary | ICD-10-CM | POA: Insufficient documentation

## 2016-05-26 DIAGNOSIS — W19XXXA Unspecified fall, initial encounter: Secondary | ICD-10-CM

## 2016-05-26 DIAGNOSIS — Y9389 Activity, other specified: Secondary | ICD-10-CM | POA: Diagnosis not present

## 2016-05-26 DIAGNOSIS — J449 Chronic obstructive pulmonary disease, unspecified: Secondary | ICD-10-CM | POA: Insufficient documentation

## 2016-05-26 DIAGNOSIS — E119 Type 2 diabetes mellitus without complications: Secondary | ICD-10-CM | POA: Insufficient documentation

## 2016-05-26 DIAGNOSIS — Z7722 Contact with and (suspected) exposure to environmental tobacco smoke (acute) (chronic): Secondary | ICD-10-CM | POA: Insufficient documentation

## 2016-05-26 DIAGNOSIS — S5002XA Contusion of left elbow, initial encounter: Secondary | ICD-10-CM

## 2016-05-26 DIAGNOSIS — W1800XA Striking against unspecified object with subsequent fall, initial encounter: Secondary | ICD-10-CM | POA: Insufficient documentation

## 2016-05-26 DIAGNOSIS — J45909 Unspecified asthma, uncomplicated: Secondary | ICD-10-CM | POA: Diagnosis not present

## 2016-05-26 DIAGNOSIS — Z79899 Other long term (current) drug therapy: Secondary | ICD-10-CM | POA: Diagnosis not present

## 2016-05-26 DIAGNOSIS — Y92009 Unspecified place in unspecified non-institutional (private) residence as the place of occurrence of the external cause: Secondary | ICD-10-CM | POA: Diagnosis not present

## 2016-05-26 DIAGNOSIS — S59902A Unspecified injury of left elbow, initial encounter: Secondary | ICD-10-CM | POA: Diagnosis present

## 2016-05-26 LAB — URINALYSIS, ROUTINE W REFLEX MICROSCOPIC
BACTERIA UA: NONE SEEN
BILIRUBIN URINE: NEGATIVE
Glucose, UA: >=500 mg/dL — AB
Hgb urine dipstick: NEGATIVE
Ketones, ur: NEGATIVE mg/dL
Leukocytes, UA: NEGATIVE
NITRITE: NEGATIVE
PH: 5 (ref 5.0–8.0)
Protein, ur: NEGATIVE mg/dL
SPECIFIC GRAVITY, URINE: 1.025 (ref 1.005–1.030)

## 2016-05-26 LAB — CBC WITH DIFFERENTIAL/PLATELET
Basophils Absolute: 0.1 10*3/uL (ref 0.0–0.1)
Basophils Relative: 1 %
Eosinophils Absolute: 0.2 10*3/uL (ref 0.0–0.7)
Eosinophils Relative: 3 %
HEMATOCRIT: 37.2 % (ref 36.0–46.0)
HEMOGLOBIN: 12.1 g/dL (ref 12.0–15.0)
LYMPHS ABS: 2.4 10*3/uL (ref 0.7–4.0)
LYMPHS PCT: 42 %
MCH: 28.7 pg (ref 26.0–34.0)
MCHC: 32.5 g/dL (ref 30.0–36.0)
MCV: 88.4 fL (ref 78.0–100.0)
MONO ABS: 0.5 10*3/uL (ref 0.1–1.0)
MONOS PCT: 9 %
NEUTROS ABS: 2.6 10*3/uL (ref 1.7–7.7)
NEUTROS PCT: 45 %
Platelets: 222 10*3/uL (ref 150–400)
RBC: 4.21 MIL/uL (ref 3.87–5.11)
RDW: 13.3 % (ref 11.5–15.5)
WBC: 5.7 10*3/uL (ref 4.0–10.5)

## 2016-05-26 LAB — BASIC METABOLIC PANEL
ANION GAP: 7 (ref 5–15)
BUN: 14 mg/dL (ref 6–20)
CALCIUM: 9.6 mg/dL (ref 8.9–10.3)
CO2: 28 mmol/L (ref 22–32)
Chloride: 103 mmol/L (ref 101–111)
Creatinine, Ser: 0.73 mg/dL (ref 0.44–1.00)
GFR calc non Af Amer: >60 mL/min (ref >60)
GLUCOSE: 97 mg/dL (ref 65–99)
Potassium: 4 mmol/L (ref 3.5–5.1)
Sodium: 138 mmol/L (ref 135–145)

## 2016-05-26 LAB — CBG MONITORING, ED: Glucose-Capillary: 111 mg/dL — ABNORMAL HIGH (ref 65–99)

## 2016-05-26 MED ORDER — HYDROCODONE-ACETAMINOPHEN 5-325 MG PO TABS: 1.0000 | ORAL_TABLET | ORAL | 0 refills | Status: DC | PRN

## 2016-05-26 MED ORDER — NAPROXEN 500 MG PO TABS: 500.0000 mg | ORAL_TABLET | Freq: Two times a day (BID) | ORAL | 0 refills | Status: DC

## 2016-05-26 MED ORDER — HYDROCODONE-ACETAMINOPHEN 5-325 MG PO TABS
1.0000 | ORAL_TABLET | Freq: Once | ORAL | Status: AC
  Administered 2016-05-26: 1 via ORAL
  Filled 2016-05-26: qty 1

## 2016-05-26 NOTE — ED Provider Notes (Signed)
[facility] DEPT Provider Note   CSN: [id number] Arrival date & time: 05/26/16  1650  By signing my name below, I, Collene Leyden, attest that this documentation has been prepared under the direction and in the presence of Evalee Jefferson, PA-C. Electronically Signed: Collene Leyden, Scribe. 05/26/16. 5:40 PM.  History   Chief Complaint Chief Complaint  Patient presents with  . Fall    HPI Comments: Charlotte Perez is a 56 y.o. female with a hx of DM and DDD (lumbar and cervical), who presents to the Emergency Department complaining of sudden-onset lower back pain s/p a mechanical fall that happened yesterday. Patient reports feeling briefly lightheaded which caused her to misstep, landing on her back against the step edge. Patient reports hitting her left elbow during the fall. Patient has associated left elbow pain, dysuria (worse yesterday), and lower back pain. Patient reports taking tylenol and ibuprofen with no relief in pain. Patient reports right hip tingling is similar to prior sciatica. Patient denies any head trauma, loss of consciousness, flank pain, hematuria, loss of bladder/bowel control, weakness/numbness in the legs, or hx of anemia.   The history is provided by the patient. No language interpreter was used.    Past Medical History:  Diagnosis Date  . Arthritis   . Asthma   . Bipolar disorder ([place])   . Chronic back pain   . Chronic neck pain   . COPD (chronic obstructive pulmonary disease) ([place])   . DDD (degenerative disc disease), cervical   . DDD (degenerative disc disease), lumbar   . Depression   . Diabetes mellitus without complication ([place])   . Diabetes mellitus, type II ([place])   . Gallbladder sludge   . GERD (gastroesophageal reflux disease)   . Hyperlipidemia   . Hypertension   . Lumbar radiculopathy   . Mole (skin)    pt. thinks its a tick   . Neuropathy of foot    Feet   . Renal cyst   . Shingles   . Skin lesions, generalized    1.2 CM FLAT FAWN  COLOR AT THE T10 AREA JUST RIGHT OF SPINE     Patient Active Problem List   Diagnosis Date Noted  . Diabetic peripheral neuropathy ([place]) 12/01/2015  . Overweight (BMI 25.0-29.9) 08/31/2015  . Migraine 12/26/2014  . GAD (generalized anxiety disorder) 10/25/2013  . GERD (gastroesophageal reflux disease) 10/25/2013  . Depression 12/29/2012  . 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 Diabetes mellitus ([place]) 06/27/2010  . Hyperlipidemia 06/27/2010    Past Surgical History:  Procedure Laterality Date  . ABDOMINAL HYSTERECTOMY    . BACK SURGERY    . CESAREAN SECTION     x 2  . NECK SURGERY      OB History    No data available       Home Medications    Prior to Admission medications   Medication Sig Start Date End Date Taking? Authorizing Provider  albuterol (PROVENTIL) (2.5 MG/3ML) 0.083% nebulizer solution Take 2.5 mg by nebulization every 6 (six) hours as needed for wheezing or shortness of breath.   Yes Historical Provider, MD  ALPRAZolam Duanne Moron) 1 MG tablet Take 1 tablet (1 mg total) by mouth at bedtime as needed for anxiety. 03/12/16 03/12/17 Yes Cloria Spring, MD  empagliflozin (JARDIANCE) 10 MG TABS tablet Take 10 mg by mouth daily. 03/06/16  Yes Sharion Balloon, FNP  estradiol (ESTRACE) 0.5 MG tablet TAKE ONE TABLET BY MOUTH TWICE DAILY 12/12/15  Yes  Sharion Balloon, FNP  lisinopril (PRINIVIL,ZESTRIL) 10 MG tablet TAKE ONE (1) TABLET EACH DAY 01/17/16  Yes Sharion Balloon, FNP  metFORMIN (GLUCOPHAGE) 1000 MG tablet TAKE ONE TABLET TWICE A DAY WITH FOOD 04/11/16  Yes Sharion Balloon, FNP  methylphenidate (RITALIN) 20 MG tablet Take 1 tablet (20 mg total) by mouth 2 (two) times daily with breakfast and lunch. 03/12/16 03/12/17 Yes Cloria Spring, MD  Multiple Vitamins-Minerals (ADULT ONE DAILY GUMMIES PO) Take by mouth daily.   Yes Historical Provider, MD  omeprazole (PRILOSEC) 20 MG capsule TAKE ONE (1) CAPSULE EACH DAY 12/12/15  Yes Sharion Balloon, FNP  pioglitazone (ACTOS) 15 MG tablet TAKE ONE (1) TABLET EACH DAY 03/13/16  Yes Sharion Balloon, FNP  simvastatin (ZOCOR) 20 MG tablet TAKE ONE TABLET DAILY AT BEDTIME 04/11/16  Yes Sharion Balloon, FNP  venlafaxine XR (EFFEXOR-XR) 150 MG 24 hr capsule Take 2 in the pm Patient taking differently: Take 300 mg by mouth at bedtime. Take 2 in the pm 03/12/16  Yes Cloria Spring, MD  ACCU-CHEK AVIVA PLUS test strip EVERY DAY 12/12/15   Sharion Balloon, FNP  cyclobenzaprine (FLEXERIL) 10 MG tablet Take 1 tablet (10 mg total) by mouth 2 (two) times daily as needed for muscle spasms. Patient not taking: Reported on 05/26/2016 04/12/16   Frederica Kuster, PA-C  HYDROcodone-acetaminophen (NORCO/VICODIN) 5-325 MG tablet Take 1 tablet by mouth every 4 (four) hours as needed. 05/26/16   Evalee Jefferson, PA-C  ibuprofen (ADVIL,MOTRIN) 800 MG tablet Take 1 tablet (800 mg total) by mouth 3 (three) times daily. Patient not taking: Reported on 05/26/2016 04/12/16   Frederica Kuster, PA-C  methylphenidate (RITALIN) 20 MG tablet Take 1 tablet (20 mg total) by mouth 2 (two) times daily with breakfast and lunch. Patient not taking: Reported on 05/26/2016 03/12/16 03/12/17  Cloria Spring, MD  methylphenidate (RITALIN) 20 MG tablet Take 1 tablet (20 mg total) by mouth 2 (two) times daily with breakfast and lunch. Patient not taking: Reported on 05/26/2016 03/12/16 03/12/17  Cloria Spring, MD  naproxen (NAPROSYN) 500 MG tablet Take 1 tablet (500 mg total) by mouth 2 (two) times daily. 05/26/16   Evalee Jefferson, PA-C    Family History Family History  Problem Relation Age of Onset  . ADD / ADHD Other   . COPD Father   . Alcohol abuse Father   . Depression Daughter   . Alcohol abuse Mother   . COPD Mother     on oxygen  . Colon cancer Paternal Grandfather   . Heart disease Sister   . Heart disease Brother   . Arthritis Sister     knee replacement  . Diabetes Maternal Grandmother   . Diabetes Sister     Social  History Social History  Substance Use Topics  . Smoking status: Passive Smoke Exposure - Never Smoker  . Smokeless tobacco: Never Used  . Alcohol use No     Allergies   Cymbalta [duloxetine hcl]; Gabapentin; Meloxicam; and Tramadol   Review of Systems Review of Systems  Constitutional: Negative.   HENT: Negative.   Respiratory: Negative for shortness of breath.   Cardiovascular: Negative for chest pain.  Gastrointestinal: Negative for nausea and vomiting.  Genitourinary: Positive for dysuria.  Musculoskeletal: Positive for arthralgias (lower back, right hip, left elbow).  Skin: Negative.  Negative for wound.  Neurological: Negative for headaches.     Physical Exam Updated Vital Signs BP 117/73 (BP  Location: Right Arm)   Pulse 90   Temp 98.5 F (36.9 C) (Oral)   Resp 16   Ht 4\' 11"  (1.499 m)   Wt 70.3 kg   SpO2 100%   BMI 31.31 kg/m   Physical Exam  Constitutional: She is oriented to person, place, and time. She appears well-developed and well-nourished.  HENT:  Head: Normocephalic and atraumatic.  Eyes: Conjunctivae and EOM are normal. Pupils are equal, round, and reactive to light.  Neck: Normal range of motion. Neck supple.  Cardiovascular: Normal rate and intact distal pulses.   Pedal pulses normal.  Pulmonary/Chest: Effort normal.  Abdominal: Soft. Bowel sounds are normal. She exhibits no distension and no mass. There is no tenderness.  Musculoskeletal: Normal range of motion. She exhibits tenderness. She exhibits no edema.       Left elbow: She exhibits normal range of motion, no swelling, no effusion and no deformity. Tenderness found. Olecranon process tenderness noted.       Lumbar back: She exhibits tenderness. She exhibits no swelling, no edema and no spasm.  Neurological: She is alert and oriented to person, place, and time. She has normal strength. She displays no atrophy and no tremor. No sensory deficit. Gait normal.  Reflex Scores:      Patellar  reflexes are 2+ on the right side and 2+ on the left side. No strength deficit noted in hip and knee flexor and extensor muscle groups.  Ankle flexion and extension intact.  Skin: Skin is warm and dry.  Psychiatric: She has a normal mood and affect.  Nursing note and vitals reviewed.    ED Treatments / Results  DIAGNOSTIC STUDIES: Oxygen Saturation is 100% on RA, normal by my interpretation.    COORDINATION OF CARE: 5:40 PM Discussed treatment plan with pt at bedside and pt agreed to plan, which includes imaging and blood work.   Labs (all labs ordered are listed, but only abnormal results are displayed) Labs Reviewed  URINALYSIS, ROUTINE W REFLEX MICROSCOPIC - Abnormal; Notable for the following:       Result Value   Glucose, UA >=500 (*)    All other components within normal limits  CBG MONITORING, ED - Abnormal; Notable for the following:    Glucose-Capillary 111 (*)    All other components within normal limits  BASIC METABOLIC PANEL  CBC WITH DIFFERENTIAL/PLATELET    EKG  EKG Interpretation None       Radiology Dg Lumbar Spine Complete  Result Date: 05/26/2016 CLINICAL DATA:  [place] [address] down basement steps yesterday, pain lower back and radiating down right hip, left elbow pain EXAM: LUMBAR SPINE - COMPLETE 4+ VIEW COMPARISON:  Lumbar spine plain film dated 04/12/2016. FINDINGS: Osseous alignment is stable. No fracture line or displaced fracture fragment seen. No compression fracture deformity. Fixation hardware at the lumbosacral junction appears intact and stable in alignment. Degenerative disc desiccations are seen throughout the lumbar spine, mild to moderate in degree, stable in the interval. Visualized paravertebral soft tissues are unremarkable. IMPRESSION: No acute findings. No osseous fracture or dislocation. Fixation hardware at the lumbosacral junction appears intact and stable in alignment. Electronically Signed   By: Franki Cabot M.D.   On: 05/26/2016 18:18   Dg  Elbow Complete Left  Result Date: 05/26/2016 CLINICAL DATA:  Fall, pain. EXAM: LEFT ELBOW - COMPLETE 3+ VIEW COMPARISON:  None. FINDINGS: There is no evidence of fracture, dislocation, or joint effusion. There is no evidence of arthropathy or other focal bone abnormality. Soft  tissues are unremarkable. IMPRESSION: Negative. Electronically Signed   By: Franki Cabot M.D.   On: 05/26/2016 18:19    Procedures Procedures (including critical care time)  Medications Ordered in ED Medications  HYDROcodone-acetaminophen (NORCO/VICODIN) 5-325 MG per tablet 1 tablet (1 tablet Oral Given 05/26/16 1936)     Initial Impression / Assessment and Plan / ED Course  I have reviewed the triage vital signs and the nursing notes.  Pertinent labs & imaging results that were available during my care of the patient were reviewed by me and considered in my medical decision making (see chart for details).     Imaging, labs and orthostatic VS reviewed, stable.  Pt with mechanical fall, imaging negative for acute injury and no focal neuro deficits.  She was advised activity as tolerated, heat tx, naproxen, few hydrocodone - advised caution with sedation.  She has f/u with pcp in 2 weeks, advised recheck sooner for any worsened or new sx.  The patient appears reasonably screened and/or stabilized for discharge and I doubt any other medical condition or other [facility] requiring further screening, evaluation, or treatment in the ED at this time prior to discharge.   Final Clinical Impressions(s) / ED Diagnoses   Final diagnoses:  Acute right-sided low back pain without sciatica  Contusion of left elbow, initial encounter  Fall in home, initial encounter    New Prescriptions Discharge Medication List as of 05/26/2016  7:31 PM    START taking these medications   Details  HYDROcodone-acetaminophen (NORCO/VICODIN) 5-325 MG tablet Take 1 tablet by mouth every 4 (four) hours as needed., Starting Sun 05/26/2016, Print     naproxen (NAPROSYN) 500 MG tablet Take 1 tablet (500 mg total) by mouth 2 (two) times daily., Starting Sun 05/26/2016, Print       I personally performed the services described in this documentation, which was scribed in my presence. The recorded information has been reviewed and is accurate.    Evalee Jefferson, PA-C 05/26/16 2019    Francine Graven, DO 05/28/16 1453

## 2016-05-26 NOTE — Discharge Instructions (Signed)
Do not drive within 4 hours of taking hydrocodone as this will make you drowsy.  Avoid lifting,  Bending,  Twisting or any other activity that worsens your pain over the next week.  Apply a heating pad to your back for 20 minutes 3 times daily.  You should get rechecked if your symptoms are not better over the next 5 days,  Or you develop increased pain,  Weakness in your leg(s) or loss of bladder or bowel function - these are symptoms of a worse injury.

## 2016-05-26 NOTE — ED Triage Notes (Signed)
Pt reports going downstairs yesterday and falling, injuring left elbow and lower back. Pt denies head trauma or LOC. Also denies neck pain.  Alert and oriented.

## 2016-05-30 ENCOUNTER — Telehealth (HOSPITAL_COMMUNITY): Payer: Self-pay | Admitting: *Deleted

## 2016-05-30 NOTE — Telephone Encounter (Signed)
left voice message, provider out of office 06/10/16. 

## 2016-06-04 ENCOUNTER — Ambulatory Visit: Payer: Medicaid Other | Admitting: Family

## 2016-06-05 ENCOUNTER — Emergency Department (HOSPITAL_COMMUNITY): Payer: Medicaid Other

## 2016-06-05 ENCOUNTER — Encounter (HOSPITAL_COMMUNITY): Payer: Self-pay | Admitting: Emergency Medicine

## 2016-06-05 ENCOUNTER — Ambulatory Visit (INDEPENDENT_AMBULATORY_CARE_PROVIDER_SITE_OTHER): Payer: Medicaid Other | Admitting: Family

## 2016-06-05 ENCOUNTER — Emergency Department (HOSPITAL_COMMUNITY)
Admission: EM | Admit: 2016-06-05 | Discharge: 2016-06-05 | Disposition: A | Discharge status: Home / Self Care | Payer: Medicaid Other | Attending: Emergency Medicine | Admitting: Emergency Medicine

## 2016-06-05 ENCOUNTER — Encounter: Payer: Self-pay | Admitting: Family

## 2016-06-05 VITALS — BP 109/76 | HR 84 | Temp 97.6°F | Ht 59.0 in | Wt 154.2 lb

## 2016-06-05 DIAGNOSIS — E1142 Type 2 diabetes mellitus with diabetic polyneuropathy: Secondary | ICD-10-CM | POA: Diagnosis not present

## 2016-06-05 DIAGNOSIS — Z79899 Other long term (current) drug therapy: Secondary | ICD-10-CM | POA: Insufficient documentation

## 2016-06-05 DIAGNOSIS — M25551 Pain in right hip: Secondary | ICD-10-CM | POA: Diagnosis present

## 2016-06-05 DIAGNOSIS — G8929 Other chronic pain: Secondary | ICD-10-CM | POA: Insufficient documentation

## 2016-06-05 DIAGNOSIS — J45909 Unspecified asthma, uncomplicated: Secondary | ICD-10-CM | POA: Diagnosis not present

## 2016-06-05 DIAGNOSIS — Z7984 Long term (current) use of oral hypoglycemic drugs: Secondary | ICD-10-CM | POA: Insufficient documentation

## 2016-06-05 DIAGNOSIS — K219 Gastro-esophageal reflux disease without esophagitis: Secondary | ICD-10-CM

## 2016-06-05 DIAGNOSIS — F321 Major depressive disorder, single episode, moderate: Secondary | ICD-10-CM

## 2016-06-05 DIAGNOSIS — Z7722 Contact with and (suspected) exposure to environmental tobacco smoke (acute) (chronic): Secondary | ICD-10-CM | POA: Diagnosis not present

## 2016-06-05 DIAGNOSIS — M5136 Other intervertebral disc degeneration, lumbar region: Secondary | ICD-10-CM

## 2016-06-05 DIAGNOSIS — I10 Essential (primary) hypertension: Secondary | ICD-10-CM | POA: Diagnosis not present

## 2016-06-05 DIAGNOSIS — M545 Low back pain, unspecified: Secondary | ICD-10-CM | POA: Insufficient documentation

## 2016-06-05 DIAGNOSIS — J449 Chronic obstructive pulmonary disease, unspecified: Secondary | ICD-10-CM | POA: Diagnosis not present

## 2016-06-05 DIAGNOSIS — E119 Type 2 diabetes mellitus without complications: Secondary | ICD-10-CM | POA: Insufficient documentation

## 2016-06-05 DIAGNOSIS — E663 Overweight: Secondary | ICD-10-CM | POA: Diagnosis not present

## 2016-06-05 DIAGNOSIS — M1611 Unilateral primary osteoarthritis, right hip: Secondary | ICD-10-CM | POA: Insufficient documentation

## 2016-06-05 DIAGNOSIS — E785 Hyperlipidemia, unspecified: Secondary | ICD-10-CM | POA: Diagnosis not present

## 2016-06-05 DIAGNOSIS — F411 Generalized anxiety disorder: Secondary | ICD-10-CM

## 2016-06-05 LAB — CMP14+EGFR
A/G RATIO: 1.6 (ref 1.2–2.2)
ALBUMIN: 3.9 g/dL (ref 3.5–5.5)
ALT: 8 IU/L (ref 0–32)
AST: 13 IU/L (ref 0–40)
Alkaline Phosphatase: 51 IU/L (ref 39–117)
BUN / CREAT RATIO: 29 — AB (ref 9–23)
BUN: 22 mg/dL (ref 6–24)
Bilirubin Total: <0.2 mg/dL (ref 0.0–1.2)
CALCIUM: 9.5 mg/dL (ref 8.7–10.2)
CO2: 24 mmol/L (ref 18–29)
CREATININE: 0.75 mg/dL (ref 0.57–1.00)
Chloride: 98 mmol/L (ref 96–106)
GFR, EST AFRICAN AMERICAN: 103 mL/min/{1.73_m2} (ref >59)
GFR, EST NON AFRICAN AMERICAN: 89 mL/min/{1.73_m2} (ref >59)
Globulin, Total: 2.5 g/dL (ref 1.5–4.5)
Glucose: 131 mg/dL — ABNORMAL HIGH (ref 65–99)
POTASSIUM: 4.9 mmol/L (ref 3.5–5.2)
SODIUM: 138 mmol/L (ref 134–144)
TOTAL PROTEIN: 6.4 g/dL (ref 6.0–8.5)

## 2016-06-05 LAB — BAYER DCA HB A1C WAIVED: HB A1C: 6.9 % (ref <7.0)

## 2016-06-05 LAB — LIPID PANEL
CHOL/HDL RATIO: 2.3 ratio (ref 0.0–4.4)
Cholesterol, Total: 171 mg/dL (ref 100–199)
HDL: 76 mg/dL (ref >39)
LDL CALC: 72 mg/dL (ref 0–99)
Triglycerides: 117 mg/dL (ref 0–149)
VLDL Cholesterol Cal: 23 mg/dL (ref 5–40)

## 2016-06-05 MED ORDER — LISINOPRIL 5 MG PO TABS: 5.0000 mg | ORAL_TABLET | Freq: Every day | ORAL | 1 refills | Status: DC

## 2016-06-05 MED ORDER — CYCLOBENZAPRINE HCL 10 MG PO TABS
10.0000 mg | ORAL_TABLET | Freq: Once | ORAL | Status: AC
  Administered 2016-06-05: 10 mg via ORAL
  Filled 2016-06-05: qty 1

## 2016-06-05 MED ORDER — PREDNISONE 20 MG PO TABS
40.0000 mg | ORAL_TABLET | Freq: Once | ORAL | Status: AC
  Administered 2016-06-05: 40 mg via ORAL
  Filled 2016-06-05: qty 2

## 2016-06-05 MED ORDER — ONDANSETRON HCL 4 MG PO TABS
4.0000 mg | ORAL_TABLET | Freq: Once | ORAL | Status: AC
  Administered 2016-06-05: 4 mg via ORAL
  Filled 2016-06-05: qty 1

## 2016-06-05 MED ORDER — HYDROCODONE-ACETAMINOPHEN 5-325 MG PO TABS
2.0000 | ORAL_TABLET | Freq: Once | ORAL | Status: AC
  Administered 2016-06-05: 2 via ORAL
  Filled 2016-06-05: qty 2

## 2016-06-05 NOTE — ED Triage Notes (Signed)
Pt reports she continues to have back pain on the right side.  Has been present for months.  States she went to her pcp this morning for the pain and physical therapy was recommended.  Pt states the only thing that helps this pain is Hydrocodone, which is why she came here.

## 2016-06-05 NOTE — Patient Instructions (Signed)
Chronic Back Pain When back pain lasts longer than 3 months, it is called chronic back pain.The cause of your back pain may not be known. Some common causes include:  Wear and tear (degenerative disease) of the bones, ligaments, or disks in your back.  Inflammation and stiffness in your back (arthritis). People who have chronic back pain often go through certain periods in which the pain is more intense (flare-ups). Many people can learn to manage the pain with home care. Follow these instructions at home: Pay attention to any changes in your symptoms. Take these actions to help with your pain: Activity   Avoid bending and activities that make the problem worse.  Do not sit or stand in one place for long periods of time.  Take brief periods of rest throughout the day. This will reduce your pain. Resting in a lying or standing position is usually better than sitting to rest.  When you are resting for longer periods, mix in some mild activity or stretching between periods of rest. This will help to prevent stiffness and pain.  Get regular exercise. Ask your health care provider what activities are safe for you.  Do not lift anything that is heavier than 10 lb (4.5 kg). Always use proper lifting technique, which includes:  Bending your knees.  Keeping the load close to your body.  Avoiding twisting. Managing pain   If directed, apply ice to the painful area. Your health care provider may recommend applying ice during the first 24-48 hours after a flare-up begins.  Put ice in a plastic bag.  Place a towel between your skin and the bag.  Leave the ice on for 20 minutes, 2-3 times per day.  After icing, apply heat to the affected area as often as told by your health care provider. Use the heat source that your health care provider recommends, such as a moist heat pack or a heating pad.  Place a towel between your skin and the heat source.  Leave the heat on for 20-30  minutes.  Remove the heat if your skin turns bright red. This is especially important if you are unable to feel pain, heat, or cold. You may have a greater risk of getting burned.  Try soaking in a warm tub.  Take over-the-counter and prescription medicines only as told by your health care provider.  Keep all follow-up visits as told by your health care provider. This is important. Contact a health care provider if:  You have pain that is not relieved with rest or medicine. Get help right away if:  You have weakness or numbness in one or both of your legs or feet.  You have trouble controlling your bladder or your bowels.  You have nausea or vomiting.  You have pain in your abdomen.  You have shortness of breath or you faint. This information is not intended to replace advice given to you by your health care provider. Make sure you discuss any questions you have with your health care provider. Document Released: 04/18/2004 Document Revised: 07/20/2015 Document Reviewed: 08/29/2014 Elsevier Interactive Patient Education  2017 [employer]  

## 2016-06-05 NOTE — ED Provider Notes (Signed)
[facility] DEPT Provider Note   CSN: [id number] Arrival date & time: 06/05/16  1408     History   Chief Complaint Chief Complaint  Patient presents with  . Back Pain    HPI Charlotte Perez is a 56 y.o. female.  Patient is a 56 year old female who presents to the emergency department with a complaint of hip pain and back pain.  The patient states that she's been having these problems for a few months now. She states that they are getting progressively worse. She was seen by her primary physician today. She states that she was examined and told to use physical therapy. And that if this was not effective than perhaps MRIs would be ordered. The patient states that she was concerned that she did not receive pain medication, in particular she did not receive hydrocodone. Patient came to the emergency department because she says she's not been given any clear information as to why she continues to have pain. She is also concerned that the pain is keeping her from resting at night. His been no loss of bowel or bladder function. The patient is able to ambulate, but with discomfort.   The history is provided by the patient.    Past Medical History:  Diagnosis Date  . Arthritis   . Asthma   . Bipolar disorder ([place])   . Chronic back pain   . Chronic neck pain   . COPD (chronic obstructive pulmonary disease) ([place])   . DDD (degenerative disc disease), cervical   . DDD (degenerative disc disease), lumbar   . Depression   . Diabetes mellitus without complication ([place])   . Diabetes mellitus, type II ([place])   . Gallbladder sludge   . GERD (gastroesophageal reflux disease)   . Hyperlipidemia   . Hypertension   . Lumbar radiculopathy   . Mole (skin)    pt. thinks its a tick   . Neuropathy of foot    Feet   . Renal cyst   . Shingles   . Skin lesions, generalized    1.2 CM FLAT FAWN COLOR AT THE T10 AREA JUST RIGHT OF SPINE     Patient Active Problem List   Diagnosis Date Noted    . Chronic low back pain 06/05/2016  . Diabetic peripheral neuropathy ([place]) 12/01/2015  . Overweight (BMI 25.0-29.9) 08/31/2015  . Migraine 12/26/2014  . GAD (generalized anxiety disorder) 10/25/2013  . GERD (gastroesophageal reflux disease) 10/25/2013  . Depression 12/29/2012  . 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 Diabetes mellitus ([place]) 06/27/2010  . Hyperlipidemia 06/27/2010    Past Surgical History:  Procedure Laterality Date  . ABDOMINAL HYSTERECTOMY    . BACK SURGERY    . CESAREAN SECTION     x 2  . NECK SURGERY      OB History    No data available       Home Medications    Prior to Admission medications   Medication Sig Start Date End Date Taking? Authorizing Provider  ACCU-CHEK AVIVA PLUS test strip EVERY DAY 12/12/15   Sharion Balloon, FNP  albuterol (PROVENTIL) (2.5 MG/3ML) 0.083% nebulizer solution Take 2.5 mg by nebulization every 6 (six) hours as needed for wheezing or shortness of breath.    Historical Provider, MD  ALPRAZolam Duanne Moron) 1 MG tablet Take 1 tablet (1 mg total) by mouth at bedtime as needed for anxiety. 03/12/16 03/12/17  Cloria Spring, MD  cyclobenzaprine (FLEXERIL) 10 MG tablet Take 1 tablet (  10 mg total) by mouth 2 (two) times daily as needed for muscle spasms. 04/12/16   Frederica Kuster, PA-C  empagliflozin (JARDIANCE) 10 MG TABS tablet Take 10 mg by mouth daily. 03/06/16   Sharion Balloon, FNP  estradiol (ESTRACE) 0.5 MG tablet TAKE ONE TABLET BY MOUTH TWICE DAILY 12/12/15   Sharion Balloon, FNP  HYDROcodone-acetaminophen (NORCO/VICODIN) 5-325 MG tablet Take 1 tablet by mouth every 4 (four) hours as needed. Patient not taking: Reported on 06/05/2016 05/26/16   Evalee Jefferson, PA-C  ibuprofen (ADVIL,MOTRIN) 800 MG tablet Take 1 tablet (800 mg total) by mouth 3 (three) times daily. 04/12/16   Alexandra M Law, PA-C  lisinopril (PRINIVIL,ZESTRIL) 5 MG tablet Take 1 tablet (5 mg total) by mouth daily. 06/05/16   Sharion Balloon,  FNP  metFORMIN (GLUCOPHAGE) 1000 MG tablet TAKE ONE TABLET TWICE A DAY WITH FOOD 04/11/16   Sharion Balloon, FNP  methylphenidate (RITALIN) 20 MG tablet Take 1 tablet (20 mg total) by mouth 2 (two) times daily with breakfast and lunch. 03/12/16 03/12/17  Cloria Spring, MD  methylphenidate (RITALIN) 20 MG tablet Take 1 tablet (20 mg total) by mouth 2 (two) times daily with breakfast and lunch. Patient not taking: Reported on 05/26/2016 03/12/16 03/12/17  Cloria Spring, MD  methylphenidate (RITALIN) 20 MG tablet Take 1 tablet (20 mg total) by mouth 2 (two) times daily with breakfast and lunch. Patient not taking: Reported on 05/26/2016 03/12/16 03/12/17  Cloria Spring, MD  Multiple Vitamins-Minerals (ADULT ONE DAILY GUMMIES PO) Take by mouth daily.    Historical Provider, MD  naproxen (NAPROSYN) 500 MG tablet Take 1 tablet (500 mg total) by mouth 2 (two) times daily. Patient not taking: Reported on 06/05/2016 05/26/16   Evalee Jefferson, PA-C  omeprazole (PRILOSEC) 20 MG capsule TAKE ONE (1) CAPSULE EACH DAY 12/12/15   Sharion Balloon, FNP  pioglitazone (ACTOS) 15 MG tablet TAKE ONE (1) TABLET EACH DAY 03/13/16   Sharion Balloon, FNP  simvastatin (ZOCOR) 20 MG tablet TAKE ONE TABLET DAILY AT BEDTIME 04/11/16   Sharion Balloon, FNP  venlafaxine XR (EFFEXOR-XR) 150 MG 24 hr capsule Take 2 in the pm Patient taking differently: Take 300 mg by mouth at bedtime. Take 2 in the pm 03/12/16   Cloria Spring, MD    Family History Family History  Problem Relation Age of Onset  . ADD / ADHD Other   . COPD Father   . Alcohol abuse Father   . Depression Daughter   . Alcohol abuse Mother   . COPD Mother     on oxygen  . Colon cancer Paternal Grandfather   . Heart disease Sister   . Heart disease Brother   . Arthritis Sister     knee replacement  . Diabetes Maternal Grandmother   . Diabetes Sister     Social History Social History  Substance Use Topics  . Smoking status: Passive Smoke Exposure - Never Smoker   . Smokeless tobacco: Never Used  . Alcohol use No     Allergies   Cymbalta [duloxetine hcl]; Gabapentin; Meloxicam; and Tramadol   Review of Systems Review of Systems  Musculoskeletal: Positive for arthralgias and back pain.  Psychiatric/Behavioral: The patient is nervous/anxious.   All other systems reviewed and are negative.    Physical Exam Updated Vital Signs BP 110/71 (BP Location: Left Arm)   Pulse 79   Temp 98 F (36.7 C) (Oral)   Resp 18  Ht 4\' 11"  (1.499 m)   Wt 70.3 kg   SpO2 100%   BMI 31.31 kg/m   Physical Exam  Constitutional: She is oriented to person, place, and time. She appears well-developed and well-nourished.  Non-toxic appearance.  HENT:  Head: Normocephalic.  Right Ear: Tympanic membrane and external ear normal.  Left Ear: Tympanic membrane and external ear normal.  Eyes: EOM and lids are normal. Pupils are equal, round, and reactive to light.  Neck: Normal range of motion. Neck supple. Carotid bruit is not present.  Cardiovascular: Normal rate, regular rhythm, normal heart sounds, intact distal pulses and normal pulses.   Pulmonary/Chest: Breath sounds normal. No respiratory distress.  Abdominal: Soft. Bowel sounds are normal. There is no tenderness. There is no guarding.  Musculoskeletal: Normal range of motion.  There is pain to palpation of the lumbar area. There is some paraspinal area tenderness appreciated at the lumbar region. There is no deformity of the quadricep area. The patella is in the midline. There is crepitus with flexion and extension of the right knee. There is no effusion appreciated. There's no deformity of the anterior tibial tuberosity. The dorsalis pedis pulses 2+. There is full range of motion of the right ankle. The Achilles tendon is intact.  Lymphadenopathy:       Head (right side): No submandibular adenopathy present.       Head (left side): No submandibular adenopathy present.    She has no cervical adenopathy.    Neurological: She is alert and oriented to person, place, and time. She has normal strength. No cranial nerve deficit or sensory deficit.  There no gross neurologic deficits appreciated. There no motor deficits or sensory deficits appreciated. The gait is slow but steady.  Skin: Skin is warm and dry.  Psychiatric: Her speech is normal. Her mood appears anxious.  Nursing note and vitals reviewed.    ED Treatments / Results  Labs (all labs ordered are listed, but only abnormal results are displayed) Labs Reviewed - No data to display  EKG  EKG Interpretation None       Radiology Dg Lumbar Spine Complete  Result Date: 06/05/2016 CLINICAL DATA:  Low back and right hip pain for 3-4 days. No known injury. EXAM: LUMBAR SPINE - COMPLETE 4+ VIEW COMPARISON:  Plain films lumbar spine 04/12/2016 05/26/2016. FINDINGS: There is no fracture. Trace retrolisthesis L2 on L3 and L3 on L4 is identified. Loss of disc space height and endplate spurring are seen and most notable at L2-3. The patient is status post L5-S1 discectomy and fusion. Hardware is intact. Large colonic stool burden is noted. IMPRESSION: No acute abnormality or change compared the prior examination with degenerative disc disease and postoperative change of L5-S1 fusion again seen. Prominent colonic stool burden. Electronically Signed   By: [name] M.D.   On: 06/05/2016 16:04   Dg Hip Unilat With Pelvis 2-3 Views Right  Result Date: 06/05/2016 CLINICAL DATA:  Low back and right hip pain for 3-4 days. No known injury. EXAM: DG HIP (WITH OR WITHOUT PELVIS) 2-3V RIGHT COMPARISON:  Plain films right hip 04/12/2016. FINDINGS: No acute bony or joint abnormality is seen on the right or left. Small subchondral cysts are seen in the right and left acetabulum. Joint space is preserved. No avascular necrosis of the femoral heads. Soft tissues are unremarkable. IMPRESSION: No acute abnormality. Mild bilateral hip osteoarthritis appears  symmetric from right to left. Electronically Signed   By: [name] M.D.   On:  06/05/2016 16:02    Procedures Procedures (including critical care time)  Medications Ordered in ED Medications  cyclobenzaprine (FLEXERIL) tablet 10 mg (not administered)  predniSONE (DELTASONE) tablet 40 mg (not administered)  HYDROcodone-acetaminophen (NORCO/VICODIN) 5-325 MG per tablet 2 tablet (not administered)  ondansetron (ZOFRAN) tablet 4 mg (not administered)     Initial Impression / Assessment and Plan / ED Course  I have reviewed the triage vital signs and the nursing notes.  Pertinent labs & imaging results that were available during my care of the patient were reviewed by me and considered in my medical decision making (see chart for details).     **I have reviewed nursing notes, vital signs, and all appropriate lab and imaging results for this patient.*  Final Clinical Impressions(s) / ED Diagnoses  MDM Vital signs within normal limits. No gross neurologic deficit appreciated on examination. The x-ray of the right hip shows no acute abnormality. There is no evidence for any avascular necrosis. There is osteoarthritis in the right and left hip area. X-ray of the lumbar spine shows no fracture. There is loss of disc space height and endplate spurring most notable at the L2-L3 area the patient is also noted to have a prominent colonic stool burden.  I've discussed these findings with the patient in terms which he understands. I discussed with her to see Ms. Hawks for continued evaluation and management of her back and hip area pain. The patient is given a dose of Flexeril and Norco here in the emergency department. I've asked her to see her primary for any additional pain management.    Final diagnoses:  Hip pain, right    New Prescriptions New Prescriptions   No medications on file     Lily Kocher, PA-C 06/05/16 [place], DO 06/06/16 1526

## 2016-06-05 NOTE — Progress Notes (Signed)
Subjective:    Patient ID: Charlotte Perez, female    DOB: [birth year], 56 y.o.   MRN: [id number]  PT presents to the office today for chronic follow up. Pt is followed by Behavioral health for ADHD, GAD, and Depression. Pt is complaining of "lightheadness" over the last month that comes and goes that is worse when she stands. PT is followed by neuro surgeon annually for chronic low back pain. MRI is pending after she completes physical therapy.   Diabetes  She presents for her follow-up diabetic visit. She has type 2 diabetes mellitus. Her disease course has been fluctuating. Hypoglycemia symptoms include nervousness/anxiousness. Pertinent negatives for hypoglycemia include no confusion, headaches, mood changes, pallor or sleepiness. Associated symptoms include foot paresthesias. Pertinent negatives for diabetes include no blurred vision, no chest pain, no foot ulcerations and no visual change. Pertinent negatives for hypoglycemia complications include no blackouts and no hospitalization. Symptoms are worsening. Diabetic complications include peripheral neuropathy. Pertinent negatives for diabetic complications include no CVA, heart disease or nephropathy. Risk factors for coronary artery disease include diabetes mellitus, dyslipidemia, hypertension, stress, sedentary lifestyle and family history. Current diabetic treatment includes oral agent (triple therapy). She is compliant with treatment all of the time. She is following a generally healthy diet. Her breakfast blood glucose range is generally 110-130 mg/dl. An ACE inhibitor/angiotensin II receptor blocker is being taken. Eye exam is current (Feb 2018).  Hyperlipidemia  This is a chronic problem. The current episode started more than 1 year ago. The problem is controlled. Recent lipid tests were reviewed and are normal. Exacerbating diseases include diabetes and obesity. Pertinent negatives include no chest pain, myalgias or shortness of breath.  Current antihyperlipidemic treatment includes statins. The current treatment provides significant improvement of lipids. Risk factors for coronary artery disease include diabetes mellitus, dyslipidemia, family history, hypertension and post-menopausal.  Anxiety  Presents for follow-up visit. Onset was 1 to 6 months ago. The problem has been waxing and waning. Symptoms include depressed mood, excessive worry and nervous/anxious behavior. Patient reports no chest pain, confusion, irritability, palpitations, panic, restlessness, shortness of breath or suicidal ideas. Symptoms occur occasionally. The severity of symptoms is mild.   Her past medical history is significant for anxiety/panic attacks and depression. Past treatments include benzodiazephines and non-SSRI antidepressants. The treatment provided moderate relief. Compliance with prior treatments has been good.  Gastroesophageal Reflux  She reports no belching, no chest pain, no coughing or no heartburn. This is a chronic problem. The current episode started more than 1 year ago. The problem occurs rarely. The problem has been waxing and waning. The symptoms are aggravated by certain foods. She has tried a PPI for the symptoms. The treatment provided significant relief.  Depression       The patient presents with depression.  This is a chronic (Pt see's DR. Rudean Haskell at behavioral health ) problem.  The current episode started more than 1 year ago.   The onset quality is gradual.   The problem occurs rarely.  The problem has been waxing and waning since onset.  Associated symptoms include sad.  Associated symptoms include no helplessness, no hopelessness, no restlessness, no myalgias, no headaches and no suicidal ideas.     The symptoms are aggravated by family issues.  Past treatments include SNRIs - Serotonin and norepinephrine reuptake inhibitors.  Compliance with treatment is good.  Past medical history includes anxiety and depression.   Migraine    This is a chronic problem. The current episode started  more than 1 year ago. Episode frequency: Monthly. The problem has been waxing and waning. The pain is located in the bilateral and occipital region. The pain quality is similar to prior headaches. The pain is at a severity of 8/10. The pain is moderate. Associated symptoms include back pain. Pertinent negatives include no blurred vision, coughing or visual change. She has tried acetaminophen and ketorolac injections for the symptoms. The treatment provided moderate relief. Her past medical history is significant for migraine headaches and obesity.  Back Pain  This is a chronic problem. The current episode started more than 1 year ago. The problem occurs intermittently. The problem has been waxing and waning since onset. The pain is present in the lumbar spine. The quality of the pain is described as aching. The pain is at a severity of 10/10. The pain is moderate. The symptoms are aggravated by bending. Pertinent negatives include no chest pain or headaches. She has tried analgesics for the symptoms. The treatment provided mild relief.  Diabetic Neuropathy PT states she has bilateral numbness and burning pain of 10 out 10.  ADHD Pt currently taking Ritalin 10 mg. PT is followed by Dr, Harrington Challenger every 3 months.    Review of Systems  Constitutional: Negative.  Negative for irritability.  HENT: Negative.   Eyes: Negative.  Negative for blurred vision.  Respiratory: Negative.  Negative for cough and shortness of breath.   Cardiovascular: Negative.  Negative for chest pain and palpitations.  Gastrointestinal: Negative.  Negative for heartburn.  Endocrine: Negative.   Genitourinary: Negative.   Musculoskeletal: Positive for back pain. Negative for myalgias.  Skin: Negative for pallor.  Neurological: Negative.  Negative for headaches.  Hematological: Negative.   Psychiatric/Behavioral: Positive for depression. Negative for confusion and suicidal  ideas. The patient is nervous/anxious.   All other systems reviewed and are negative.      Objective:   Physical Exam  Constitutional: She is oriented to person, place, and time. She appears well-developed and well-nourished. No distress.  HENT:  Head: Normocephalic and atraumatic.  Right Ear: External ear normal.  Left Ear: External ear normal.  Nose: Nose normal.  Mouth/Throat: Oropharynx is clear and moist.  Eyes: Pupils are equal, round, and reactive to light.  Neck: Normal range of motion. Neck supple. No thyromegaly present.  Cardiovascular: Normal rate, regular rhythm, normal heart sounds and intact distal pulses.   No murmur heard. Pulmonary/Chest: Effort normal and breath sounds normal. No respiratory distress. She has no wheezes.  Abdominal: Soft. Bowel sounds are normal. She exhibits no distension. There is no tenderness.  Musculoskeletal: Normal range of motion. She exhibits tenderness (right lower lumbar with flexion and rotation). She exhibits no edema.  Neurological: She is alert and oriented to person, place, and time.  Skin: Skin is warm and dry.  Psychiatric: She has a normal mood and affect. Her behavior is normal. Judgment and thought content normal.  Vitals reviewed.  Diabetic Foot Exam - Simple   Simple Foot Form Diabetic Foot exam was performed with the following findings:  Yes 06/05/2016  8:48 AM  Visual Inspection No deformities, no ulcerations, no other skin breakdown bilaterally:  Yes Sensation Testing Intact to touch and monofilament testing bilaterally:  Yes Pulse Check Posterior Tibialis and Dorsalis pulse intact bilaterally:  Yes Comments      BP 109/76   Pulse 84   Temp 97.6 F (36.4 C) (Oral)   Ht _0  (1.499 m)   Wt 154 lb 3.2 oz (69.9 kg)  BMI 31.14 kg/m      Assessment & Plan:  1. Gastroesophageal reflux disease without esophagitis - CMP14+EGFR  2. Type 2 diabetes mellitus with diabetic polyneuropathy, without long-term  current use of insulin (HCC) -Lisinopril decreased to 5 mg from 10 mg related to lightheadedness  - CMP14+EGFR - Bayer DCA Hb A1c Waived - lisinopril (PRINIVIL,ZESTRIL) 5 MG tablet; Take 1 tablet (5 mg total) by mouth daily.  Dispense: 90 tablet; Refill: 1  3. Overweight (BMI 25.0-29.9) - CMP14+EGFR  4. Hyperlipidemia, unspecified hyperlipidemia type - CMP14+EGFR - Lipid panel  5. Moderate single current episode of major depressive disorder (HCC) - CMP14+EGFR  6. GAD (generalized anxiety disorder) - CMP14+EGFR  7. Diabetic peripheral neuropathy (HCC) - CMP14+EGFR  8. Chronic midline low back pain without sciatica -Will order PT today for patient, per pt she needs to complete PT before she can get her MRI scheduled  - CMP14+EGFR - Ambulatory referral to Physical Therapy   Continue all meds, keep all appts with Behavioral Health and Neurologists   Labs pending Health Maintenance reviewed-Opthalmology 04/2016 at [facility] in [place]. Will have them fax to our office.  Diet and exercise encouraged RTO 4 months   Evelina Dun, FNP

## 2016-06-05 NOTE — Discharge Instructions (Signed)
The x-ray of your right hip shows osteoarthritis present. Is no evidence of any avascular necrosis or fracture. The x-ray of your lumbar spine shows degenerative disc disease and arthritis present. There no fractures appreciated. Please use a heating pad to your hip and back area. Please discuss these findings with Ms Charlotte Perez. She will manage your case and your pain. Please return to the emergency department if any emergent changes, problems, or concerns.

## 2016-06-08 ENCOUNTER — Other Ambulatory Visit: Payer: Self-pay | Admitting: Family

## 2016-06-10 ENCOUNTER — Ambulatory Visit (HOSPITAL_COMMUNITY): Payer: Self-pay | Admitting: Psychiatry

## 2016-06-11 ENCOUNTER — Telehealth: Payer: Self-pay | Admitting: Family

## 2016-06-11 ENCOUNTER — Ambulatory Visit (INDEPENDENT_AMBULATORY_CARE_PROVIDER_SITE_OTHER): Payer: Medicaid Other | Admitting: Psychiatry

## 2016-06-11 ENCOUNTER — Encounter (HOSPITAL_COMMUNITY): Payer: Self-pay | Admitting: Psychiatry

## 2016-06-11 VITALS — BP 107/66 | HR 89 | Ht 59.0 in | Wt 153.2 lb

## 2016-06-11 DIAGNOSIS — Z79899 Other long term (current) drug therapy: Secondary | ICD-10-CM | POA: Diagnosis not present

## 2016-06-11 DIAGNOSIS — Z811 Family history of alcohol abuse and dependence: Secondary | ICD-10-CM

## 2016-06-11 DIAGNOSIS — F4310 Post-traumatic stress disorder, unspecified: Secondary | ICD-10-CM

## 2016-06-11 DIAGNOSIS — F321 Major depressive disorder, single episode, moderate: Secondary | ICD-10-CM

## 2016-06-11 DIAGNOSIS — F900 Attention-deficit hyperactivity disorder, predominantly inattentive type: Secondary | ICD-10-CM

## 2016-06-11 DIAGNOSIS — G8929 Other chronic pain: Secondary | ICD-10-CM

## 2016-06-11 DIAGNOSIS — Z818 Family history of other mental and behavioral disorders: Secondary | ICD-10-CM | POA: Diagnosis not present

## 2016-06-11 DIAGNOSIS — Z888 Allergy status to other drugs, medicaments and biological substances status: Secondary | ICD-10-CM | POA: Diagnosis not present

## 2016-06-11 DIAGNOSIS — M545 Low back pain: Principal | ICD-10-CM

## 2016-06-11 DIAGNOSIS — F988 Other specified behavioral and emotional disorders with onset usually occurring in childhood and adolescence: Secondary | ICD-10-CM

## 2016-06-11 MED ORDER — ALPRAZOLAM 1 MG PO TABS: 1.0000 mg | ORAL_TABLET | Freq: Every evening | ORAL | 2 refills | Status: DC | PRN

## 2016-06-11 MED ORDER — METHYLPHENIDATE HCL 20 MG PO TABS: 20.0000 mg | ORAL_TABLET | Freq: Two times a day (BID) | ORAL | 0 refills | Status: DC

## 2016-06-11 MED ORDER — VENLAFAXINE HCL ER 150 MG PO CP24: 300.0000 mg | ORAL_CAPSULE | Freq: Every day | ORAL | 2 refills | Status: DC

## 2016-06-11 MED ORDER — METHYLPHENIDATE HCL 20 MG PO TABS
20.0000 mg | ORAL_TABLET | Freq: Two times a day (BID) | ORAL | 0 refills | Status: DC
Start: 2016-06-11 — End: 2016-09-11

## 2016-06-11 NOTE — Telephone Encounter (Signed)
Charlotte Perez, please advise.
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
## 2016-06-12 ENCOUNTER — Other Ambulatory Visit: Payer: Self-pay | Admitting: Family

## 2016-06-12 DIAGNOSIS — M545 Low back pain: Principal | ICD-10-CM

## 2016-06-12 DIAGNOSIS — G8929 Other chronic pain: Secondary | ICD-10-CM

## 2016-06-12 NOTE — Telephone Encounter (Signed)
Referral to Pain Clinic ordered

## 2016-06-26 ENCOUNTER — Ambulatory Visit: Payer: Self-pay | Admitting: Physical Therapy

## 2016-07-10 ENCOUNTER — Other Ambulatory Visit: Payer: Self-pay | Admitting: Family

## 2016-07-10 DIAGNOSIS — E1165 Type 2 diabetes mellitus with hyperglycemia: Secondary | ICD-10-CM

## 2016-07-26 ENCOUNTER — Ambulatory Visit (INDEPENDENT_AMBULATORY_CARE_PROVIDER_SITE_OTHER): Payer: Medicaid Other | Admitting: Family

## 2016-07-26 ENCOUNTER — Encounter: Payer: Self-pay | Admitting: Family

## 2016-07-26 VITALS — BP 113/77 | HR 76 | Temp 97.6°F | Ht 59.0 in | Wt 150.8 lb

## 2016-07-26 DIAGNOSIS — M545 Low back pain: Secondary | ICD-10-CM

## 2016-07-26 DIAGNOSIS — G8929 Other chronic pain: Secondary | ICD-10-CM | POA: Diagnosis not present

## 2016-07-26 DIAGNOSIS — M25551 Pain in right hip: Secondary | ICD-10-CM | POA: Diagnosis not present

## 2016-07-26 DIAGNOSIS — K5900 Constipation, unspecified: Secondary | ICD-10-CM

## 2016-07-26 DIAGNOSIS — Z09 Encounter for follow-up examination after completed treatment for conditions other than malignant neoplasm: Secondary | ICD-10-CM | POA: Diagnosis not present

## 2016-07-26 DIAGNOSIS — R5383 Other fatigue: Secondary | ICD-10-CM | POA: Diagnosis not present

## 2016-07-26 MED ORDER — POLYETHYLENE GLYCOL 3350 17 GM/SCOOP PO POWD: 17.0000 g | Freq: Two times a day (BID) | ORAL | 1 refills | Status: DC | PRN

## 2016-07-26 NOTE — Patient Instructions (Signed)
Osteoarthritis  Osteoarthritis is a type of arthritis that affects tissue that covers the ends of bones in joints (cartilage). Cartilage acts as a cushion between the bones and helps them move smoothly. Osteoarthritis results when cartilage in the joints gets worn down. Osteoarthritis is sometimes called "wear and tear" arthritis.  Osteoarthritis is the most common form of arthritis. It often occurs in older people. It is a condition that gets worse over time (a progressive condition). Joints that are most often affected by this condition are in:  · Fingers.  · Toes.  · Hips.  · Knees.  · Spine, including neck and lower back.    What are the causes?  This condition is caused by age-related wearing down of cartilage that covers the ends of bones.  What increases the risk?  The following factors may make you more likely to develop this condition:  · Older age.  · Being overweight or obese.  · Overuse of joints, such as in athletes.  · Past injury of a joint.  · Past surgery on a joint.  · Family history of osteoarthritis.    What are the signs or symptoms?  The main symptoms of this condition are pain, swelling, and stiffness in the joint. The joint may lose its shape over time. Small pieces of bone or cartilage may break off and float inside of the joint, which may cause more pain and damage to the joint. Small deposits of bone (osteophytes) may grow on the edges of the joint. Other symptoms may include:  · A grating or scraping feeling inside the joint when you move it.  · Popping or creaking sounds when you move.    Symptoms may affect one or more joints. Osteoarthritis in a major joint, such as your knee or hip, can make it painful to walk or exercise. If you have osteoarthritis in your hands, you might not be able to grip items, twist your hand, or control small movements of your hands and fingers (fine motor skills).  How is this diagnosed?  This condition may be diagnosed based on:  · Your medical history.  · A  physical exam.  · Your symptoms.  · X-rays of the affected joint(s).  · Blood tests to rule out other types of arthritis.    How is this treated?  There is no cure for this condition, but treatment can help to control pain and improve joint function. Treatment plans may include:  · A prescribed exercise program that allows for rest and joint relief. You may work with a physical therapist.  · A weight control plan.  · Pain relief techniques, such as:  ? Applying heat and cold to the joint.  ? Electric pulses delivered to nerve endings under the skin (transcutaneous electrical nerve stimulation, or TENS).  ? Massage.  ? Certain nutritional supplements.  · NSAIDs or prescription medicines to help relieve pain.  · Medicine to help relieve pain and inflammation (corticosteroids). This can be given by mouth (orally) or as an injection.  · Assistive devices, such as a brace, wrap, splint, specialized glove, or cane.  · Surgery, such as:  ? An osteotomy. This is done to reposition the bones and relieve pain or to remove loose pieces of bone and cartilage.  ? Joint replacement surgery. You may need this surgery if you have very bad (advanced) osteoarthritis.    Follow these instructions at home:  Activity   · Rest your affected joints as directed by your   health care provider.  · Do not drive or use heavy machinery while taking prescription pain medicine.  · Exercise as directed. Your health care provider or physical therapist may recommend specific types of exercise, such as:  ? Strengthening exercises. These are done to strengthen the muscles that support joints that are affected by arthritis. They can be performed with weights or with exercise bands to add resistance.  ? Aerobic activities. These are exercises, such as brisk walking or water aerobics, that get your heart pumping.  ? Range-of-motion activities. These keep your joints easy to move.  ? Balance and agility exercises.  Managing pain, stiffness, and swelling    · If directed, apply heat to the affected area as often as told by your health care provider. Use the heat source that your health care provider recommends, such as a moist heat pack or a heating pad.  ? If you have a removable assistive device, remove it as told by your health care provider.  ? Place a towel between your skin and the heat source. If your health care provider tells you to keep the assistive device on while you apply heat, place a towel between the assistive device and the heat source.  ? Leave the heat on for 20-30 minutes.  ? Remove the heat if your skin turns bright red. This is especially important if you are unable to feel pain, heat, or cold. You may have a greater risk of getting burned.  · If directed, put ice on the affected joint:  ? If you have a removable assistive device, remove it as told by your health care provider.  ? Put ice in a plastic bag.  ? Place a towel between your skin and the bag. If your health care provider tells you to keep the assistive device on during icing, place a towel between the assistive device and the bag.  ? Leave the ice on for 20 minutes, 2-3 times a day.  General instructions   · Take over-the-counter and prescription medicines only as told by your health care provider.  · Maintain a healthy weight. Follow instructions from your health care provider for weight control. These may include dietary restrictions.  · Do not use any products that contain nicotine or tobacco, such as cigarettes and e-cigarettes. These can delay bone healing. If you need help quitting, ask your health care provider.  · Use assistive devices as directed by your health care provider.  · Keep all follow-up visits as told by your health care provider. This is important.  Where to find more information:  · National Institute of Arthritis and Musculoskeletal and Skin Diseases: www.niams.nih.gov  · National Institute on Aging: www.nia.nih.gov  · American College of Rheumatology:  www.rheumatology.org  Contact a health care provider if:  · Your skin turns red.  · You develop a rash.  · You have pain that gets worse.  · You have a fever along with joint or muscle aches.  Get help right away if:  · You lose a lot of weight.  · You suddenly lose your appetite.  · You have night sweats.  Summary  · Osteoarthritis is a type of arthritis that affects tissue covering the ends of bones in joints (cartilage).  · This condition is caused by age-related wearing down of cartilage that covers the ends of bones.  · The main symptom of this condition is pain, swelling, and stiffness in the joint.  · There is no cure for this   condition, but treatment can help to control pain and improve joint function.  This information is not intended to replace advice given to you by your health care provider. Make sure you discuss any questions you have with your health care provider.  Document Released: 03/11/2005 Document Revised: 11/13/2015 Document Reviewed: 11/13/2015  Elsevier Interactive Patient Education © 2017 Elsevier Inc.

## 2016-07-26 NOTE — Progress Notes (Signed)
   Subjective:    Patient ID: GRAYCEN DEGAN, female    DOB: [birth year], 56 y.o.   MRN: [id number]   HPI PT presents to the office today for recurrent fatigue and lower back pain. PT went to [facility] on 03/14 and was diagnosed with osteoarthritis of right and left hip and moderate stool burden. PT went to the Urgent Care on 07/08/16 with fatigue and was told her "blood count was low".   PT report recurrent fatigue and states she feels like "I have to force myself to day things". PT states she stopped her diabetic and blood pressure medications for a week and started feeling better, but then restarted them. Pt believes her blood pressure medications is causing her fatigue.   Pt to start physical therapy  on 07/29/16 for her lower back and right hip. PT hopes this will help with her pain of aching 10 out 10 pain.    Review of Systems  Constitutional: Positive for fatigue.  Musculoskeletal: Positive for arthralgias and back pain.  All other systems reviewed and are negative.      Objective:   Physical Exam  Constitutional: She is oriented to person, place, and time. She appears well-developed and well-nourished. No distress.  HENT:  Head: Normocephalic and atraumatic.  Eyes: Pupils are equal, round, and reactive to light.  Neck: Normal range of motion. Neck supple. No thyromegaly present.  Cardiovascular: Normal rate, regular rhythm, normal heart sounds and intact distal pulses.   No murmur heard. Pulmonary/Chest: Effort normal and breath sounds normal. No respiratory distress. She has no wheezes.  Abdominal: Soft. Bowel sounds are normal. She exhibits no distension. There is no tenderness.  Musculoskeletal: Normal range of motion. She exhibits no edema or tenderness.  Pain in lower back with extension, negative SLR, pain in right hip with internal rotation  Neurological: She is alert and oriented to person, place, and time.  Skin: Skin is warm and dry.  Psychiatric: She has a  normal mood and affect. Her behavior is normal. Judgment and thought content normal.  Vitals reviewed.     BP 113/77   Pulse 76   Temp 97.6 F (36.4 C) (Oral)   Ht '4\' 11"'$  (1.499 m)   Wt 150 lb 12.8 oz (68.4 kg)   BMI 30.46 kg/m      Assessment & Plan:  1. Chronic midline low back pain without sciatica - Anemia Profile B - CMP14+EGFR  2. Other fatigue - Anemia Profile B - CMP14+EGFR - Thyroid Panel With TSH  3. Right hip pain - Anemia Profile B - CMP14+EGFR  4. Hospital discharge follow-up - Anemia Profile B - CMP14+EGFR - Thyroid Panel With TSH  5. Constipation, unspecified constipation type -Pt started on miralax today Force fluids - polyethylene glycol powder (GLYCOLAX/MIRALAX) powder; Take 17 g by mouth 2 (two) times daily as needed.  Dispense: 3350 g; Refill: 1   Continue all meds and keep physical therapy appt! If pain continues may need to follow up with [facility] pending Diet and exercise encouraged RTO 3 months   Evelina Dun, FNP

## 2016-07-27 LAB — ANEMIA PROFILE B
BASOS: 1 %
Basophils Absolute: 0 10*3/uL (ref 0.0–0.2)
EOS (ABSOLUTE): 0.1 10*3/uL (ref 0.0–0.4)
Eos: 3 %
FERRITIN: 12 ng/mL — AB (ref 15–150)
Folate: 16.1 ng/mL (ref >3.0)
HEMATOCRIT: 36.7 % (ref 34.0–46.6)
Hemoglobin: 11.5 g/dL (ref 11.1–15.9)
Immature Grans (Abs): 0 10*3/uL (ref 0.0–0.1)
Immature Granulocytes: 0 %
Iron Saturation: 11 % — ABNORMAL LOW (ref 15–55)
Iron: 48 ug/dL (ref 27–159)
Lymphocytes Absolute: 1.6 10*3/uL (ref 0.7–3.1)
Lymphs: 36 %
MCH: 27.3 pg (ref 26.6–33.0)
MCHC: 31.3 g/dL — AB (ref 31.5–35.7)
MCV: 87 fL (ref 79–97)
MONOS ABS: 0.4 10*3/uL (ref 0.1–0.9)
Monocytes: 10 %
NEUTROS ABS: 2.3 10*3/uL (ref 1.4–7.0)
Neutrophils: 50 %
Platelets: 238 10*3/uL (ref 150–379)
RBC: 4.21 x10E6/uL (ref 3.77–5.28)
RDW: 14.6 % (ref 12.3–15.4)
RETIC CT PCT: 1 % (ref 0.6–2.6)
Total Iron Binding Capacity: 436 ug/dL (ref 250–450)
UIBC: 388 ug/dL (ref 131–425)
VITAMIN B 12: 162 pg/mL — AB (ref 232–1245)
WBC: 4.5 10*3/uL (ref 3.4–10.8)

## 2016-07-27 LAB — CMP14+EGFR
A/G RATIO: 1.4 (ref 1.2–2.2)
ALT: 9 IU/L (ref 0–32)
AST: 11 IU/L (ref 0–40)
Albumin: 3.8 g/dL (ref 3.5–5.5)
Alkaline Phosphatase: 55 IU/L (ref 39–117)
BUN / CREAT RATIO: 25 — AB (ref 9–23)
BUN: 18 mg/dL (ref 6–24)
CO2: 24 mmol/L (ref 18–29)
CREATININE: 0.72 mg/dL (ref 0.57–1.00)
Calcium: 9.7 mg/dL (ref 8.7–10.2)
Chloride: 101 mmol/L (ref 96–106)
GFR, EST AFRICAN AMERICAN: 108 mL/min/{1.73_m2} (ref >59)
GFR, EST NON AFRICAN AMERICAN: 94 mL/min/{1.73_m2} (ref >59)
GLOBULIN, TOTAL: 2.7 g/dL (ref 1.5–4.5)
Glucose: 134 mg/dL — ABNORMAL HIGH (ref 65–99)
Potassium: 4.9 mmol/L (ref 3.5–5.2)
SODIUM: 141 mmol/L (ref 134–144)
TOTAL PROTEIN: 6.5 g/dL (ref 6.0–8.5)

## 2016-07-27 LAB — THYROID PANEL WITH TSH
FREE THYROXINE INDEX: 1.6 (ref 1.2–4.9)
T3 Uptake Ratio: 17 % — ABNORMAL LOW (ref 24–39)
T4, Total: 9.4 ug/dL (ref 4.5–12.0)
TSH: 1.76 u[IU]/mL (ref 0.450–4.500)

## 2016-07-29 ENCOUNTER — Ambulatory Visit: Payer: Medicaid Other | Attending: Family | Admitting: Physical Therapy

## 2016-07-29 DIAGNOSIS — M545 Low back pain: Secondary | ICD-10-CM | POA: Diagnosis present

## 2016-07-29 DIAGNOSIS — G8929 Other chronic pain: Secondary | ICD-10-CM | POA: Insufficient documentation

## 2016-07-29 DIAGNOSIS — R293 Abnormal posture: Secondary | ICD-10-CM | POA: Insufficient documentation

## 2016-07-29 NOTE — Therapy (Signed)
[name], [place], [address] Phone: [phone]   Fax:  [phone]  Physical Therapy Evaluation  Patient Details  Name: Charlotte Perez MRN: [id number] Date of Birth: 1960-12-13 Referring Provider: Evelina Dun   Encounter Date: 07/29/2016      PT End of Session - 07/29/16 1241    Visit Number 1   Number of Visits 1   Date for PT Re-Evaluation 07/29/16   PT Start Time 0953   PT Stop Time 1019   PT Time Calculation (min) 26 min   Activity Tolerance Patient tolerated treatment well   Behavior During Therapy [facility] for tasks assessed/performed      Past Medical History:  Diagnosis Date  . Arthritis   . Asthma   . Bipolar disorder ([place])   . Chronic back pain   . Chronic neck pain   . COPD (chronic obstructive pulmonary disease) ([place])   . DDD (degenerative disc disease), cervical   . DDD (degenerative disc disease), lumbar   . Depression   . Diabetes mellitus without complication ([place])   . Diabetes mellitus, type II ([place])   . Gallbladder sludge   . GERD (gastroesophageal reflux disease)   . Hyperlipidemia   . Hypertension   . Lumbar radiculopathy   . Mole (skin)    pt. thinks its a tick   . Neuropathy of foot    Feet   . Renal cyst   . Shingles   . Skin lesions, generalized    1.2 CM FLAT FAWN COLOR AT THE T10 AREA JUST RIGHT OF SPINE     Past Surgical History:  Procedure Laterality Date  . ABDOMINAL HYSTERECTOMY    . BACK SURGERY    . CESAREAN SECTION     x 2  . NECK SURGERY      There were no vitals filed for this visit.       Subjective Assessment - 07/29/16 1330    Subjective The patient has a long h/o low back pain and underwent a lumbar surgery.  She rates her pain at a 9/10 today with pain radiating into her right hip region.  The pain has been very intense since 05/23/16.  increased walking and sitting increase pain.  Her sleep is also disturbed by pain.  She has not found  anything to really decrease her pain.   Pertinent History Lumbar surgery.   Limitations Sitting;Walking   How long can you sit comfortably? 10-15 minutes.   How long can you walk comfortably? 5-10 minutes.   Patient Stated Goals reduce my pain.   Currently in Pain? Yes   Pain Score 9    Pain Location Back   Pain Orientation Lower   Pain Descriptors / Indicators Aching   Pain Type Acute pain   Pain Onset More than a month ago   Pain Frequency Constant   Aggravating Factors  See above.   Pain Relieving Factors See above.            [facility] PT Assessment - 07/29/16 0001      Assessment   Medical Diagnosis Chronic midline low back pain without sciatica.   Referring Provider Evelina Dun    Onset Date/Surgical Date --  05/23/16.     Precautions   Precautions --  Lumbar surgery.     Restrictions   Weight Bearing Restrictions No     Balance Screen   Has the patient fallen in the past 6 months No   Has  the patient had a decrease in activity level because of a fear of falling?  No   Is the patient reluctant to leave their home because of a fear of falling?  No     Home Ecologist residence     Prior Function   Level of Independence Independent     Posture/Postural Control   Posture/Postural Control No significant limitations     ROM / Strength   AROM / PROM / Strength AROM;Strength     AROM   Overall AROM Comments Active lumbar extension= 12 degrees and flexion= 25 degreees.     Strength   Overall Strength Comments Normal bilateral LE strength.     Palpation   Palpation comment Tender with overpressure at L5-S1 and tender at L3 with tightness over bilateral erector spinae musculature.     Special Tests    Special Tests Lumbar;Leg LengthTest  Right patellar reflex 3+/4+. [place]ther LE DTR's are normal.   Lumbar Tests --  Poistive right SLR test.   Leg length test  --  Equal leg lengths.     Transfers   Transfers --  Slow and  purposeful.     Ambulation/Gait   Gait Comments The patient walks in spinal flexion in obvious pain.                                       Plan - 07/29/16 1353    Clinical Impression Statement The patient presents to OPPT with c/o a very high pain-level.  She is very limited with regards to spinal range of motion.  She has a positive right SLR test and her her Patellar reflex is very reactive.  Pain prohibits her ability to perform ADL's, sit and walk for extended periods of time and sleep without being awakened by pain.   Rehab Potential Fair   PT Frequency 1x / week  Eval only.   Consulted and Agree with Plan of Care Patient      Patient will benefit from skilled therapeutic intervention in order to improve the following deficits and impairments:     Visit Diagnosis: Chronic midline low back pain without sciatica - Plan: PT plan of care cert/re-cert  Abnormal posture - Plan: PT plan of care cert/re-cert     Problem List Patient Active Problem List   Diagnosis Date Noted  . Chronic low back pain 06/05/2016  . Diabetic peripheral neuropathy ([place]) 12/01/2015  . Overweight (BMI 25.0-29.9) 08/31/2015  . Migraine 12/26/2014  . GAD (generalized anxiety disorder) 10/25/2013  . GERD (gastroesophageal reflux disease) 10/25/2013  . Depression 12/29/2012  . 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 Diabetes mellitus ([place]) 06/27/2010  . Hyperlipidemia 06/27/2010    Jaramiah Bossard, [place]  MPT 07/29/2016, 2:22 PM  [facility] [address] [place], [place], [address] Phone: [phone]   Fax:  [phone]  Name: Charlotte Perez MRN: [id number] Date of Birth: 02-02-1961

## 2016-08-02 ENCOUNTER — Ambulatory Visit: Payer: Medicaid Other | Admitting: Family

## 2016-08-09 ENCOUNTER — Encounter: Payer: Self-pay | Admitting: Family

## 2016-08-09 ENCOUNTER — Ambulatory Visit (INDEPENDENT_AMBULATORY_CARE_PROVIDER_SITE_OTHER): Payer: Medicaid Other | Admitting: Family

## 2016-08-09 VITALS — BP 114/73 | HR 89 | Temp 98.1°F | Ht 59.0 in | Wt 152.6 lb

## 2016-08-09 DIAGNOSIS — W540XXA Bitten by dog, initial encounter: Secondary | ICD-10-CM | POA: Diagnosis not present

## 2016-08-09 DIAGNOSIS — S80921A Unspecified superficial injury of right lower leg, initial encounter: Secondary | ICD-10-CM

## 2016-08-09 MED ORDER — MUPIROCIN 2 % EX OINT: 1.0000 "application " | TOPICAL_OINTMENT | Freq: Two times a day (BID) | CUTANEOUS | 0 refills | Status: DC
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
## 2016-08-09 NOTE — Patient Instructions (Signed)
Animal Bite Animal bites can range from mild to serious. An animal bite can result in a scratch on the skin, a deep open cut, a puncture of the skin, a crush injury, or tearing away of the skin or a body part. A small bite from a house pet will usually not cause serious problems. However, some animal bites can become infected or injure a bone or other tissue. Bites from certain animals can be more dangerous because of the risk of spreading rabies, which is a serious viral infection. This risk is higher with bites from stray animals or wild animals, such as raccoons, foxes, skunks, and bats. Dogs are responsible for most animal bites. Children are bitten more often than adults. What are the signs or symptoms? Common symptoms of an animal bite include:  Pain.  Bleeding.  Swelling.  Bruising. How is this diagnosed? This condition may be diagnosed based on a physical exam and medical history. Your health care provider will examine the wound and ask for details about the animal and how the bite happened. You may also have tests, such as:  Blood tests to check for infection or to determine if surgery is needed.  X-rays to check for damage to bones or joints.  Culture test. This uses a sample of fluid from the wound to check for infection. How is this treated? Treatment varies depending on the location and type of animal bite and your medical history. Treatment may include:  Wound care. This often includes cleaning the wound, flushing the wound with saline solution, and applying a bandage (dressing). Sometimes, the wound is left open to heal because of the high risk of infection. However, in some cases, the wound may be closed with stitches (sutures), staples, skin glue, or adhesive strips.  Antibiotic medicine.  Tetanus shot.  Rabies treatment if the animal could have rabies. In some cases, bites that have become infected may require IV antibiotics and surgical treatment in the  hospital. Follow these instructions at home: Wound care   Follow instructions from your health care provider about how to take care of your wound. Make sure you:  Wash your hands with soap and water before you change your dressing. If soap and water are not available, use hand sanitizer.  Change your dressing as told by your health care provider.  Leave sutures, skin glue, or adhesive strips in place. These skin closures may need to be in place for 2 weeks or longer. If adhesive strip edges start to loosen and curl up, you may trim the loose edges. Do not remove adhesive strips completely unless your health care provider tells you to do that.  Check your wound every day for signs of infection. Watch for:  Increasing redness, swelling, or pain.  Fluid, blood, or pus. General instructions   Take or apply over-the-counter and prescription medicines only as told by your health care provider.  If you were prescribed an antibiotic, take or apply it as told by your health care provider. Do not stop using the antibiotic even if your condition improves.  Keep the injured area raised (elevated) above the level of your heart while you are sitting or lying down, if this is possible.  If directed, apply ice to the injured area.  Put ice in a plastic bag.  Place a towel between your skin and the bag.  Leave the ice on for 20 minutes, 2-3 times per day.  Keep all follow-up visits as told by your health care provider.  This is important. Contact a health care provider if:  You have increasing redness, swelling, or pain at the site of your wound.  You have a general feeling of sickness (malaise).  You feel nauseous or you vomit.  You have pain that does not get better. Get help right away if:  You have a red streak extending away from your wound.  You have fluid, blood, or pus coming from your wound.  You have a fever or chills.  You have trouble moving your injured area.  You have  numbness or tingling extending beyond the wound. This information is not intended to replace advice given to you by your health care provider. Make sure you discuss any questions you have with your health care provider. Document Released: 11/27/2010 Document Revised: 07/19/2015 Document Reviewed: 07/27/2014 Elsevier Interactive Patient Education  2017 [employer].

## 2016-08-15 ENCOUNTER — Encounter: Payer: Self-pay | Admitting: Pediatrics

## 2016-08-15 ENCOUNTER — Ambulatory Visit (INDEPENDENT_AMBULATORY_CARE_PROVIDER_SITE_OTHER): Payer: Medicaid Other | Admitting: Pediatrics

## 2016-08-15 VITALS — BP 117/82 | HR 79 | Temp 98.0°F | Ht 59.0 in | Wt 152.8 lb

## 2016-08-15 DIAGNOSIS — W540XXD Bitten by dog, subsequent encounter: Secondary | ICD-10-CM | POA: Diagnosis not present

## 2016-08-15 DIAGNOSIS — L03115 Cellulitis of right lower limb: Secondary | ICD-10-CM | POA: Diagnosis not present

## 2016-08-15 DIAGNOSIS — Z23 Encounter for immunization: Secondary | ICD-10-CM

## 2016-08-15 MED ORDER — AMOXICILLIN-POT CLAVULANATE 875-125 MG PO TABS: 1.0000 | ORAL_TABLET | Freq: Two times a day (BID) | ORAL | 0 refills | Status: DC

## 2016-08-15 NOTE — Patient Instructions (Addendum)
You need to be seen in an emergency room today to start rabies vaccine series for unprovoked dog bite in an unknown dog If you have any trouble, let us know

## 2016-08-15 NOTE — Progress Notes (Signed)
  Subjective:   Patient ID: Charlotte Perez, female    DOB: Jul 01, 1960, 56 y.o.   MRN: [id number] CC: Animal Bite (Last friday)  HPI: Charlotte Perez is a 56 y.o. female presenting for Animal Bite (Last friday)  Had an unprovoked dog bite from stray dog on 5/18 at her house Pt called and talked with animal control, they came out, not able to find dog hasnt seen dog since Bite on R calf is getting more red, now burning some Has been using antibiotic ointment on it Some bruising on the back of her calf Last tetanus in 2012  Relevant past medical, surgical, family and social history reviewed. Allergies and medications reviewed and updated. History  Smoking Status  . Passive Smoke Exposure - Never Smoker  Smokeless Tobacco  . Never Used   ROS: Per HPI   Objective:    BP 117/82   Pulse 79   Temp 98 F (36.7 C) (Oral)   Ht 4\' 11"  (1.499 m)   Wt 152 lb 12.8 oz (69.3 kg)   BMI 30.86 kg/m   Wt Readings from Last 3 Encounters:  08/15/16 152 lb 12.8 oz (69.3 kg)  08/09/16 152 lb 9.6 oz (69.2 kg)  07/26/16 150 lb 12.8 oz (68.4 kg)    Gen: NAD, alert, cooperative with exam, NCAT EYES: EOMI, no conjunctival injection, or no icterus CV: WWP Resp:  normal WOB Neuro: Alert and oriented Skin: R calf with yellow bruising, 1 cm laceration, open, small amount of bloody discharge, 1-2cm surrounding erythema and induration, mildly ttp  Assessment & Plan:  Falen was seen today for animal bite.  Diagnoses and all orders for this visit:  Dog bite, subsequent encounter Unprovoked dog bite from stray animal, recommend rabies Ig and vaccination Pt will need to go to ED for rabies immunizations, discussed, pt voices understanding Tetanus given here today -     amoxicillin-clavulanate (AUGMENTIN) 875-125 MG tablet; Take 1 tablet by mouth 2 (two) times daily. -     Td : Tetanus/diphtheria >7yo Preservative  free  Cellulitis of right lower extremity -     amoxicillin-clavulanate  (AUGMENTIN) 875-125 MG tablet; Take 1 tablet by mouth 2 (two) times daily. -     Td : Tetanus/diphtheria >7yo Preservative  free   Follow up plan: As needed Assunta Found, MD [place]

## 2016-09-05 ENCOUNTER — Ambulatory Visit (INDEPENDENT_AMBULATORY_CARE_PROVIDER_SITE_OTHER): Payer: Medicaid Other | Admitting: Family

## 2016-09-05 ENCOUNTER — Encounter: Payer: Self-pay | Admitting: Family

## 2016-09-05 VITALS — BP 122/80 | HR 89 | Temp 97.8°F | Ht 59.0 in | Wt 146.6 lb

## 2016-09-05 DIAGNOSIS — G43109 Migraine with aura, not intractable, without status migrainosus: Secondary | ICD-10-CM | POA: Diagnosis not present

## 2016-09-05 DIAGNOSIS — F411 Generalized anxiety disorder: Secondary | ICD-10-CM

## 2016-09-05 DIAGNOSIS — Z1159 Encounter for screening for other viral diseases: Secondary | ICD-10-CM | POA: Diagnosis not present

## 2016-09-05 DIAGNOSIS — M545 Low back pain, unspecified: Secondary | ICD-10-CM

## 2016-09-05 DIAGNOSIS — K5900 Constipation, unspecified: Secondary | ICD-10-CM | POA: Diagnosis not present

## 2016-09-05 DIAGNOSIS — D509 Iron deficiency anemia, unspecified: Secondary | ICD-10-CM | POA: Diagnosis not present

## 2016-09-05 DIAGNOSIS — G8929 Other chronic pain: Secondary | ICD-10-CM

## 2016-09-05 DIAGNOSIS — K219 Gastro-esophageal reflux disease without esophagitis: Secondary | ICD-10-CM

## 2016-09-05 DIAGNOSIS — E1142 Type 2 diabetes mellitus with diabetic polyneuropathy: Secondary | ICD-10-CM

## 2016-09-05 DIAGNOSIS — F321 Major depressive disorder, single episode, moderate: Secondary | ICD-10-CM | POA: Diagnosis not present

## 2016-09-05 DIAGNOSIS — E785 Hyperlipidemia, unspecified: Secondary | ICD-10-CM | POA: Diagnosis not present

## 2016-09-05 DIAGNOSIS — E663 Overweight: Secondary | ICD-10-CM

## 2016-09-05 DIAGNOSIS — W540XXS Bitten by dog, sequela: Secondary | ICD-10-CM

## 2016-09-05 HISTORY — DX: Constipation, unspecified: K59.00

## 2016-09-05 HISTORY — DX: Iron deficiency anemia, unspecified: D50.9

## 2016-09-05 LAB — BAYER DCA HB A1C WAIVED: HB A1C (BAYER DCA - WAIVED): 6.7 % (ref <7.0)

## 2016-09-05 MED ORDER — SULFAMETHOXAZOLE-TRIMETHOPRIM 800-160 MG PO TABS: 1.0000 | ORAL_TABLET | Freq: Two times a day (BID) | ORAL | 0 refills | Status: DC

## 2016-09-05 NOTE — Progress Notes (Signed)
Subjective:    Patient ID: Charlotte Perez, female    DOB: [birth year], 56 y.o.   MRN: [id number]  PT presents to the office today for chronic follow up. Pt is followed by Behavioral health for ADHD, GAD, and Depression.  PT is followed by neuro surgeon annually for chronic low back pain.  Diabetes  She presents for her follow-up diabetic visit. She has type 2 diabetes mellitus. Hypoglycemia symptoms include nervousness/anxiousness. Associated symptoms include fatigue and foot paresthesias. Pertinent negatives for diabetes include no blurred vision and no visual change. Symptoms are worsening. Diabetic complications include peripheral neuropathy. Pertinent negatives for diabetic complications include no CVA or heart disease. Risk factors for coronary artery disease include dyslipidemia, diabetes mellitus, hypertension and post-menopausal. Her breakfast blood glucose range is generally 180-200 mg/dl.  Gastroesophageal Reflux  She reports no abdominal pain, no coughing or no heartburn. This is a chronic problem. The current episode started more than 1 year ago. The problem occurs occasionally. Associated symptoms include fatigue. She has tried a PPI for the symptoms. The treatment provided moderate relief.  Migraine   This is a chronic problem. The current episode started more than 1 year ago. The problem occurs intermittently. Progression since onset: "It has been 4 months since my last headache" The pain quality is similar to prior headaches. Associated symptoms include back pain. Pertinent negatives include no abdominal pain, blurred vision, coughing or visual change.  Back Pain  This is a chronic problem. The current episode started more than 1 year ago. The problem occurs constantly. The problem has been waxing and waning since onset. The pain is present in the lumbar spine. The pain is at a severity of 9/10. The symptoms are aggravated by bending, twisting and lying down. Pertinent negatives  include no abdominal pain. She has tried bed rest, NSAIDs and muscle relaxant for the symptoms. The treatment provided mild relief.  Hyperlipidemia  This is a chronic problem. The current episode started more than 1 year ago. The problem is controlled. Recent lipid tests were reviewed and are normal. Current antihyperlipidemic treatment includes statins. The current treatment provides moderate improvement of lipids. Risk factors for coronary artery disease include diabetes mellitus, dyslipidemia and a sedentary lifestyle.  Anxiety  Presents for follow-up visit. Symptoms include decreased concentration, excessive worry, irritability and nervous/anxious behavior. Symptoms occur occasionally.   Her past medical history is significant for anemia.  Constipation  This is a chronic problem. The current episode started more than 1 year ago. The problem has been waxing and waning since onset. Her stool frequency is 2 to 3 times per week. Associated symptoms include back pain. Pertinent negatives include no abdominal pain. She has tried laxatives for the symptoms. The treatment provided mild relief.  Anemia  Presents for follow-up visit. Symptoms include malaise/fatigue. There has been no abdominal pain or bruising/bleeding easily.  Diabetic Neuropathy PT states she stopped her mobic and gabapentin because it was "not working". PT complaining intermittent burning and tingling pain of 5 out 10 in bilateral feet.    Review of Systems  Constitutional: Positive for fatigue, irritability and malaise/fatigue.  Eyes: Negative for blurred vision.  Respiratory: Negative for cough.   Gastrointestinal: Positive for constipation. Negative for abdominal pain and heartburn.  Musculoskeletal: Positive for back pain.  Hematological: Does not bruise/bleed easily.  Psychiatric/Behavioral: Positive for decreased concentration. The patient is nervous/anxious.   All other systems reviewed and are negative.        Objective:   Physical Exam  Constitutional: She is oriented to person, place, and time. She appears well-developed and well-nourished. No distress.  HENT:  Head: Normocephalic and atraumatic.  Right Ear: External ear normal.  Mouth/Throat: Oropharynx is clear and moist.  Eyes: Pupils are equal, round, and reactive to light.  Neck: Normal range of motion. Neck supple. No thyromegaly present.  Cardiovascular: Normal rate, regular rhythm, normal heart sounds and intact distal pulses.   No murmur heard. Pulmonary/Chest: Effort normal and breath sounds normal. No respiratory distress. She has no wheezes.  Abdominal: Soft. Bowel sounds are normal. She exhibits no distension. There is no tenderness.  Musculoskeletal: Normal range of motion. She exhibits no edema or tenderness.  Neurological: She is alert and oriented to person, place, and time. She has normal reflexes. No cranial nerve deficit.  Skin: Skin is warm and dry.  Right calf puncture  wound- 1cmX0.4cm that is scabbed, area around is hard and draining serous fluid that extends 6X5cm.  Psychiatric: She has a normal mood and affect. Her behavior is normal. Judgment and thought content normal.  Vitals reviewed.     BP 122/80   Pulse 89   Temp 97.8 F (36.6 C) (Oral)   Ht _0  (1.499 m)   Wt 146 lb 9.6 oz (66.5 kg)   BMI 29.61 kg/m      Assessment & Plan:  1. Migraine with aura and without status migrainosus, not intractable - CMP14+EGFR  2. Gastroesophageal reflux disease without esophagitis - CMP14+EGFR  3. Diabetic peripheral neuropathy (HCC)  - CMP14+EGFR - Bayer DCA Hb A1c Waived  4. Type 2 diabetes mellitus with diabetic polyneuropathy, without long-term current use of insulin (HCC) - CMP14+EGFR - Bayer DCA Hb A1c Waived - Microalbumin / creatinine urine ratio  5. Overweight (BMI 25.0-29.9) - CMP14+EGFR  6. Chronic midline low back pain without sciatica  - CMP14+EGFR  7. Current moderate episode of  major depressive disorder without prior episode (HCC) - CMP14+EGFR  8. GAD (generalized anxiety disorder) - CMP14+EGFR  9. Hyperlipidemia, unspecified hyperlipidemia type  - CMP14+EGFR - Lipid panel  10. Iron deficiency anemia, unspecified iron deficiency anemia type - CMP14+EGFR - Anemia Profile B  11. Constipation, unspecified constipation type - CMP14+EGFR  12. Dog bite, sequela - Pt given bactrim - CMP14+EGFR - sulfamethoxazole-trimethoprim (BACTRIM DS) 800-160 MG tablet; Take 1 tablet by mouth 2 (two) times daily.  Dispense: 14 tablet; Refill: 0  13. Encounter for hepatitis C screening test for low risk patient  - Hepatitis C antibody   Continue all meds Labs pending Health Maintenance reviewed Diet and exercise encouraged RTO 4 months   Evelina Dun, FNP

## 2016-09-05 NOTE — Patient Instructions (Signed)

## 2016-09-06 LAB — LIPID PANEL
Chol/HDL Ratio: 2.1 ratio (ref 0.0–4.4)
Cholesterol, Total: 152 mg/dL (ref 100–199)
HDL: 72 mg/dL (ref >39)
LDL CALC: 58 mg/dL (ref 0–99)
TRIGLYCERIDES: 110 mg/dL (ref 0–149)
VLDL CHOLESTEROL CAL: 22 mg/dL (ref 5–40)

## 2016-09-06 LAB — CMP14+EGFR
ALK PHOS: 53 IU/L (ref 39–117)
ALT: 11 IU/L (ref 0–32)
AST: 17 IU/L (ref 0–40)
Albumin/Globulin Ratio: 2 (ref 1.2–2.2)
Albumin: 4.3 g/dL (ref 3.5–5.5)
BUN/Creatinine Ratio: 24 — ABNORMAL HIGH (ref 9–23)
BUN: 16 mg/dL (ref 6–24)
Bilirubin Total: <0.2 mg/dL (ref 0.0–1.2)
CO2: 26 mmol/L (ref 20–29)
CREATININE: 0.68 mg/dL (ref 0.57–1.00)
Calcium: 9.8 mg/dL (ref 8.7–10.2)
Chloride: 100 mmol/L (ref 96–106)
GFR calc Af Amer: 113 mL/min/{1.73_m2} (ref >59)
GFR calc non Af Amer: 98 mL/min/{1.73_m2} (ref >59)
GLUCOSE: 85 mg/dL (ref 65–99)
Globulin, Total: 2.2 g/dL (ref 1.5–4.5)
Potassium: 5 mmol/L (ref 3.5–5.2)
Sodium: 139 mmol/L (ref 134–144)
Total Protein: 6.5 g/dL (ref 6.0–8.5)

## 2016-09-06 LAB — ANEMIA PROFILE B
Basophils Absolute: 0.1 10*3/uL (ref 0.0–0.2)
Basos: 1 %
EOS (ABSOLUTE): 0.2 10*3/uL (ref 0.0–0.4)
Eos: 5 %
FERRITIN: 23 ng/mL (ref 15–150)
Folate: 15 ng/mL (ref >3.0)
HEMATOCRIT: 38.1 % (ref 34.0–46.6)
Hemoglobin: 12.1 g/dL (ref 11.1–15.9)
IMMATURE GRANS (ABS): 0 10*3/uL (ref 0.0–0.1)
IRON SATURATION: 15 % (ref 15–55)
Immature Granulocytes: 0 %
Iron: 64 ug/dL (ref 27–159)
LYMPHS ABS: 1.4 10*3/uL (ref 0.7–3.1)
LYMPHS: 34 %
MCH: 27.6 pg (ref 26.6–33.0)
MCHC: 31.8 g/dL (ref 31.5–35.7)
MCV: 87 fL (ref 79–97)
MONOS ABS: 0.4 10*3/uL (ref 0.1–0.9)
Monocytes: 10 %
NEUTROS PCT: 50 %
Neutrophils Absolute: 2.1 10*3/uL (ref 1.4–7.0)
Platelets: 210 10*3/uL (ref 150–379)
RBC: 4.39 x10E6/uL (ref 3.77–5.28)
RDW: 15.7 % — AB (ref 12.3–15.4)
Retic Ct Pct: 1.1 % (ref 0.6–2.6)
Total Iron Binding Capacity: 438 ug/dL (ref 250–450)
UIBC: 374 ug/dL (ref 131–425)
VITAMIN B 12: 218 pg/mL — AB (ref 232–1245)
WBC: 4.3 10*3/uL (ref 3.4–10.8)

## 2016-09-06 LAB — HEPATITIS C ANTIBODY: Hep C Virus Ab: <0.1 s/co ratio (ref 0.0–0.9)

## 2016-09-06 LAB — MICROALBUMIN / CREATININE URINE RATIO: CREATININE, UR: 60 mg/dL

## 2016-09-09 ENCOUNTER — Other Ambulatory Visit (HOSPITAL_COMMUNITY): Payer: Self-pay | Admitting: Psychiatry

## 2016-09-09 ENCOUNTER — Other Ambulatory Visit: Payer: Self-pay | Admitting: Family

## 2016-09-09 DIAGNOSIS — E1165 Type 2 diabetes mellitus with hyperglycemia: Secondary | ICD-10-CM

## 2016-09-09 DIAGNOSIS — E1142 Type 2 diabetes mellitus with diabetic polyneuropathy: Secondary | ICD-10-CM

## 2016-09-09 DIAGNOSIS — K5900 Constipation, unspecified: Secondary | ICD-10-CM

## 2016-09-11 ENCOUNTER — Encounter (HOSPITAL_COMMUNITY): Payer: Self-pay | Admitting: Psychiatry

## 2016-09-11 ENCOUNTER — Ambulatory Visit (INDEPENDENT_AMBULATORY_CARE_PROVIDER_SITE_OTHER): Payer: Medicaid Other | Admitting: Psychiatry

## 2016-09-11 VITALS — BP 96/70 | HR 90 | Ht 59.0 in | Wt 140.0 lb

## 2016-09-11 DIAGNOSIS — F988 Other specified behavioral and emotional disorders with onset usually occurring in childhood and adolescence: Secondary | ICD-10-CM | POA: Diagnosis not present

## 2016-09-11 DIAGNOSIS — Z818 Family history of other mental and behavioral disorders: Secondary | ICD-10-CM

## 2016-09-11 DIAGNOSIS — F810 Specific reading disorder: Secondary | ICD-10-CM | POA: Diagnosis not present

## 2016-09-11 DIAGNOSIS — Z811 Family history of alcohol abuse and dependence: Secondary | ICD-10-CM

## 2016-09-11 DIAGNOSIS — F812 Mathematics disorder: Secondary | ICD-10-CM | POA: Diagnosis not present

## 2016-09-11 DIAGNOSIS — F321 Major depressive disorder, single episode, moderate: Secondary | ICD-10-CM | POA: Diagnosis not present

## 2016-09-11 DIAGNOSIS — F4310 Post-traumatic stress disorder, unspecified: Secondary | ICD-10-CM | POA: Diagnosis not present

## 2016-09-11 DIAGNOSIS — F900 Attention-deficit hyperactivity disorder, predominantly inattentive type: Secondary | ICD-10-CM

## 2016-09-11 MED ORDER — METHYLPHENIDATE HCL 20 MG PO TABS: 20.0000 mg | ORAL_TABLET | Freq: Two times a day (BID) | ORAL | 0 refills | Status: DC

## 2016-09-11 MED ORDER — ALPRAZOLAM 1 MG PO TABS: 1.0000 mg | ORAL_TABLET | Freq: Every evening | ORAL | 2 refills | Status: DC | PRN

## 2016-09-11 MED ORDER — VENLAFAXINE HCL ER 150 MG PO CP24: 300.0000 mg | ORAL_CAPSULE | Freq: Every day | ORAL | 2 refills | Status: DC
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
## 2016-09-13 ENCOUNTER — Other Ambulatory Visit: Payer: Self-pay | Admitting: Family

## 2016-09-13 DIAGNOSIS — Z1231 Encounter for screening mammogram for malignant neoplasm of breast: Secondary | ICD-10-CM

## 2016-10-14 ENCOUNTER — Ambulatory Visit (HOSPITAL_COMMUNITY)
Admission: RE | Admit: 2016-10-14 | Discharge: 2016-10-14 | Disposition: A | Discharge status: Home / Self Care | Payer: Medicaid Other | Source: Ambulatory Visit | Attending: Family | Admitting: Family

## 2016-10-14 DIAGNOSIS — Z1231 Encounter for screening mammogram for malignant neoplasm of breast: Secondary | ICD-10-CM | POA: Insufficient documentation

## 2016-11-09 ENCOUNTER — Other Ambulatory Visit: Payer: Self-pay | Admitting: Family

## 2016-11-10 ENCOUNTER — Other Ambulatory Visit: Payer: Self-pay | Admitting: Family

## 2016-12-02 ENCOUNTER — Other Ambulatory Visit: Payer: Self-pay | Admitting: Family

## 2016-12-02 DIAGNOSIS — E1165 Type 2 diabetes mellitus with hyperglycemia: Secondary | ICD-10-CM

## 2016-12-06 ENCOUNTER — Other Ambulatory Visit (HOSPITAL_COMMUNITY): Payer: Self-pay | Admitting: Psychiatry

## 2016-12-06 ENCOUNTER — Other Ambulatory Visit: Payer: Self-pay | Admitting: Family

## 2016-12-06 DIAGNOSIS — E1165 Type 2 diabetes mellitus with hyperglycemia: Secondary | ICD-10-CM

## 2016-12-06 DIAGNOSIS — K5900 Constipation, unspecified: Secondary | ICD-10-CM

## 2016-12-10 ENCOUNTER — Ambulatory Visit (HOSPITAL_COMMUNITY): Payer: Self-pay | Admitting: Psychiatry

## 2016-12-11 ENCOUNTER — Ambulatory Visit (INDEPENDENT_AMBULATORY_CARE_PROVIDER_SITE_OTHER): Payer: Medicaid Other | Admitting: Psychiatry

## 2016-12-11 ENCOUNTER — Encounter (HOSPITAL_COMMUNITY): Payer: Self-pay | Admitting: Psychiatry

## 2016-12-11 VITALS — BP 95/74 | HR 85 | Ht 59.0 in | Wt 143.0 lb

## 2016-12-11 DIAGNOSIS — Z811 Family history of alcohol abuse and dependence: Secondary | ICD-10-CM

## 2016-12-11 DIAGNOSIS — Z79899 Other long term (current) drug therapy: Secondary | ICD-10-CM | POA: Diagnosis not present

## 2016-12-11 DIAGNOSIS — Z62810 Personal history of physical and sexual abuse in childhood: Secondary | ICD-10-CM

## 2016-12-11 DIAGNOSIS — F321 Major depressive disorder, single episode, moderate: Secondary | ICD-10-CM | POA: Diagnosis not present

## 2016-12-11 DIAGNOSIS — F4310 Post-traumatic stress disorder, unspecified: Secondary | ICD-10-CM

## 2016-12-11 DIAGNOSIS — F988 Other specified behavioral and emotional disorders with onset usually occurring in childhood and adolescence: Secondary | ICD-10-CM

## 2016-12-11 DIAGNOSIS — F900 Attention-deficit hyperactivity disorder, predominantly inattentive type: Secondary | ICD-10-CM | POA: Diagnosis not present

## 2016-12-11 DIAGNOSIS — Z91410 Personal history of adult physical and sexual abuse: Secondary | ICD-10-CM

## 2016-12-11 DIAGNOSIS — Z818 Family history of other mental and behavioral disorders: Secondary | ICD-10-CM | POA: Diagnosis not present

## 2016-12-11 MED ORDER — ALPRAZOLAM 1 MG PO TABS: 1.0000 mg | ORAL_TABLET | Freq: Every evening | ORAL | 2 refills | Status: DC | PRN

## 2016-12-11 MED ORDER — VENLAFAXINE HCL ER 150 MG PO CP24: 300.0000 mg | ORAL_CAPSULE | Freq: Every day | ORAL | 2 refills | Status: DC
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
## 2016-12-12 ENCOUNTER — Encounter (HOSPITAL_COMMUNITY): Payer: Self-pay | Admitting: *Deleted

## 2016-12-12 ENCOUNTER — Emergency Department (HOSPITAL_COMMUNITY)
Admission: EM | Admit: 2016-12-12 | Discharge: 2016-12-12 | Disposition: A | Discharge status: Home / Self Care | Payer: Medicaid Other | Attending: Emergency Medicine | Admitting: Emergency Medicine

## 2016-12-12 ENCOUNTER — Telehealth: Payer: Self-pay | Admitting: Family

## 2016-12-12 DIAGNOSIS — M5441 Lumbago with sciatica, right side: Secondary | ICD-10-CM | POA: Diagnosis not present

## 2016-12-12 DIAGNOSIS — J45909 Unspecified asthma, uncomplicated: Secondary | ICD-10-CM | POA: Insufficient documentation

## 2016-12-12 DIAGNOSIS — G8929 Other chronic pain: Secondary | ICD-10-CM | POA: Insufficient documentation

## 2016-12-12 DIAGNOSIS — E119 Type 2 diabetes mellitus without complications: Secondary | ICD-10-CM | POA: Diagnosis not present

## 2016-12-12 DIAGNOSIS — M545 Low back pain: Secondary | ICD-10-CM | POA: Diagnosis present

## 2016-12-12 DIAGNOSIS — Z79899 Other long term (current) drug therapy: Secondary | ICD-10-CM | POA: Insufficient documentation

## 2016-12-12 DIAGNOSIS — Z7984 Long term (current) use of oral hypoglycemic drugs: Secondary | ICD-10-CM | POA: Insufficient documentation

## 2016-12-12 DIAGNOSIS — I10 Essential (primary) hypertension: Secondary | ICD-10-CM | POA: Diagnosis not present

## 2016-12-12 MED ORDER — PREDNISONE 10 MG PO TABS: ORAL_TABLET | ORAL | 0 refills | Status: DC

## 2016-12-12 MED ORDER — METHOCARBAMOL 500 MG PO TABS
500.0000 mg | ORAL_TABLET | Freq: Once | ORAL | Status: AC
  Administered 2016-12-12: 500 mg via ORAL
  Filled 2016-12-12: qty 1

## 2016-12-12 MED ORDER — METHOCARBAMOL 500 MG PO TABS: 500.0000 mg | ORAL_TABLET | Freq: Four times a day (QID) | ORAL | 0 refills | Status: AC

## 2016-12-12 MED ORDER — PREDNISONE 10 MG PO TABS
60.0000 mg | ORAL_TABLET | Freq: Once | ORAL | Status: AC
  Administered 2016-12-12: 60 mg via ORAL
  Filled 2016-12-12: qty 1

## 2016-12-12 NOTE — Telephone Encounter (Signed)
Pt sees Dr Rolena Infante and is aware they need to be the ones to order the MRI. Pt voiced understanding and will call us back if she needs anything else.

## 2016-12-12 NOTE — ED Provider Notes (Signed)
[facility] DEPT Provider Note   CSN: [id number] Arrival date & time: 12/12/16  0845     History   Chief Complaint Chief Complaint  Patient presents with  . Back Pain    HPI Charlotte Perez is a 56 y.o. female presenting with acute on chronic low back pain which has which has been worsened for the past week, but states she really has no pain free days since this spring.  Patient denies any new injury specifically.  There is radiation of pain into Her right lateral leg to her knee.  There has been no weakness or numbness in the lower extremities and no urinary or bowel retention or incontinence.  Patient does not have a history of cancer or IVDU.  The patient has tried Tylenol and ibuprofen without significant relief of symptoms.  .  The history is provided by the patient.    Past Medical History:  Diagnosis Date  . Arthritis   . Asthma   . Bipolar disorder ([place])   . Chronic back pain   . Chronic neck pain   . COPD (chronic obstructive pulmonary disease) ([place])   . DDD (degenerative disc disease), cervical   . DDD (degenerative disc disease), lumbar   . Depression   . Diabetes mellitus without complication ([place])   . Diabetes mellitus, type II ([place])   . Gallbladder sludge   . GERD (gastroesophageal reflux disease)   . Hyperlipidemia   . Hypertension   . Lumbar radiculopathy   . Mole (skin)    pt. thinks its a tick   . Neuropathy of foot    Feet   . Renal cyst   . Shingles   . Skin lesions, generalized    1.2 CM FLAT FAWN COLOR AT THE T10 AREA JUST RIGHT OF SPINE     Patient Active Problem List   Diagnosis Date Noted  . Iron deficiency anemia 09/05/2016  . Constipation 09/05/2016  . Chronic low back pain 06/05/2016  . Diabetic peripheral neuropathy ([place]) 12/01/2015  . Overweight (BMI 25.0-29.9) 08/31/2015  . Migraine 12/26/2014  . GAD (generalized anxiety disorder) 10/25/2013  . GERD (gastroesophageal reflux disease) 10/25/2013  . Depression 12/29/2012  .  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 Diabetes mellitus ([place]) 06/27/2010  . Hyperlipidemia 06/27/2010    Past Surgical History:  Procedure Laterality Date  . ABDOMINAL HYSTERECTOMY    . BACK SURGERY    . CESAREAN SECTION     x 2  . NECK SURGERY      OB History    No data available       Home Medications    Prior to Admission medications   Medication Sig Start Date End Date Taking? Authorizing Provider  ACCU-CHEK AVIVA PLUS test strip EVERY DAY 12/12/15   Evelina Dun A, FNP  albuterol (PROVENTIL) (2.5 MG/3ML) 0.083% nebulizer solution Take 2.5 mg by nebulization every 6 (six) hours as needed for wheezing or shortness of breath.    [provider]  ALPRAZolam Duanne Moron) 1 MG tablet Take 1 tablet (1 mg total) by mouth at bedtime as needed for anxiety. 12/11/16 12/11/17  Cloria Spring, MD  Ascorbic Acid (VITAMIN C PO) Take by mouth. ImmuBlast    [provider]  cyclobenzaprine (FLEXERIL) 10 MG tablet Take 1 tablet (10 mg total) by mouth 2 (two) times daily as needed for muscle spasms. 04/12/16   Law, Bea Graff, PA-C  estradiol (ESTRACE) 0.5 MG tablet TAKE ONE TABLET BY MOUTH TWICE  DAILY 09/10/16   Evelina Dun A, FNP  ibuprofen (ADVIL,MOTRIN) 800 MG tablet TAKE 1 TABLET BY ORAL ROUTE EVERY 8 HOURS AS NEEDED 06/09/16   [provider]  IRON PO Take 27 mg by mouth.    [provider]  JARDIANCE 10 MG TABS tablet TAKE ONE (1) TABLET EACH DAY 11/11/16   Evelina Dun A, FNP  lisinopril (PRINIVIL,ZESTRIL) 5 MG tablet TAKE ONE (1) TABLET EACH DAY 09/10/16   Evelina Dun A, FNP  meloxicam (MOBIC) 15 MG tablet TAKE ONE (1) TABLET EACH DAY 12/06/16   Evelina Dun A, FNP  metFORMIN (GLUCOPHAGE) 1000 MG tablet TAKE ONE TABLET TWICE A DAY WITH FOOD 12/06/16   Evelina Dun A, FNP  methocarbamol (ROBAXIN) 500 MG tablet Take 1 tablet (500 mg total) by mouth 4 (four) times daily. 12/12/16 12/22/16  Evalee Jefferson, PA-C    methylphenidate (RITALIN) 20 MG tablet Take 1 tablet (20 mg total) by mouth 2 (two) times daily with breakfast and lunch. 09/11/16 09/11/17  Cloria Spring, MD  Multiple Vitamins-Minerals (ADULT ONE DAILY GUMMIES PO) Take by mouth daily.    [provider]  mupirocin ointment (BACTROBAN) 2 % Place 1 application into the nose 2 (two) times daily. 08/09/16   Sharion Balloon, FNP  omeprazole (PRILOSEC) 20 MG capsule TAKE ONE (1) CAPSULE EACH DAY 09/10/16   Evelina Dun A, FNP  pioglitazone (ACTOS) 15 MG tablet TAKE ONE (1) TABLET EACH DAY 11/11/16   Hawks, Alyse Low A, FNP  polyethylene glycol powder (GLYCOLAX/MIRALAX) powder MIX 17 GRAMS TWICE DAILY AS NEEDED 12/06/16   Evelina Dun A, FNP  predniSONE (DELTASONE) 10 MG tablet Take 6 tablets day one, 5 tablets day two, 4 tablets day three, 3 tablets day four, 2 tablets day five, then 1 tablet day six 12/13/16   Evalee Jefferson, PA-C  simvastatin (ZOCOR) 20 MG tablet TAKE ONE TABLET DAILY AT BEDTIME 09/10/16   Hawks, Alyse Low A, FNP  tiZANidine (ZANAFLEX) 4 MG tablet Take 4 mg by mouth every 8 (eight) hours. 06/09/16   [provider]  venlafaxine XR (EFFEXOR-XR) 150 MG 24 hr capsule Take 2 capsules (300 mg total) by mouth at bedtime. Take 2 in the pm 12/11/16   Cloria Spring, MD    Family History Family History  Problem Relation Age of Onset  . ADD / ADHD Other   . COPD Father   . Alcohol abuse Father   . Depression Daughter   . Alcohol abuse Mother   . COPD Mother        on oxygen  . Colon cancer Paternal Grandfather   . Heart disease Sister   . Heart disease Brother   . Arthritis Sister        knee replacement  . Diabetes Maternal Grandmother   . Diabetes Sister     Social History Social History  Substance Use Topics  . Smoking status: Passive Smoke Exposure - Never Smoker  . Smokeless tobacco: Never Used  . Alcohol use No     Allergies   Cymbalta [duloxetine hcl]; Gabapentin; Meloxicam; and Tramadol   Review of  Systems Review of Systems  Constitutional: Negative for fever.  Respiratory: Negative for shortness of breath.   Cardiovascular: Negative for chest pain and leg swelling.  Gastrointestinal: Negative for abdominal distention, abdominal pain and constipation.  Genitourinary: Negative for difficulty urinating, dysuria, flank pain, frequency and urgency.  Musculoskeletal: Positive for back pain. Negative for gait problem and joint swelling.  Skin: Negative for  rash.  Neurological: Negative for weakness and numbness.     Physical Exam Updated Vital Signs BP 104/76 (BP Location: Right Arm)   Pulse 83   Temp 98.1 F (36.7 C) (Oral)   Resp 18   Ht 4\' 11"  (1.499 m)   Wt 64.9 kg (143 lb)   SpO2 98%   BMI 28.88 kg/m   Physical Exam  Constitutional: She appears well-developed and well-nourished.  HENT:  Head: Normocephalic.  Eyes: Conjunctivae are normal.  Neck: Normal range of motion. Neck supple.  Cardiovascular: Normal rate and intact distal pulses.   Pedal pulses normal.  Pulmonary/Chest: Effort normal.  Abdominal: Soft. Bowel sounds are normal. She exhibits no distension and no mass.  Musculoskeletal: Normal range of motion. She exhibits no edema.       Lumbar back: She exhibits tenderness. She exhibits no swelling, no edema and no spasm.  Neurological: She is alert. She has normal strength. She displays no atrophy and no tremor. No sensory deficit. Gait normal.  Reflex Scores:      Patellar reflexes are 2+ on the right side and 2+ on the left side. No strength deficit noted in hip and knee flexor and extensor muscle groups.  Ankle flexion and extension intact.  Skin: Skin is warm and dry.  Psychiatric: She has a normal mood and affect.  Nursing note and vitals reviewed.    ED Treatments / Results  Labs (all labs ordered are listed, but only abnormal results are displayed) Labs Reviewed - No data to display  EKG  EKG Interpretation None       Radiology No results  found.  Procedures Procedures (including critical care time)  Medications Ordered in ED Medications  methocarbamol (ROBAXIN) tablet 500 mg (500 mg Oral Given 12/12/16 1117)  predniSONE (DELTASONE) tablet 60 mg (60 mg Oral Given 12/12/16 1117)     Initial Impression / Assessment and Plan / ED Course  I have reviewed the triage vital signs and the nursing notes.  Pertinent labs & imaging results that were available during my care of the patient were reviewed by me and considered in my medical decision making (see chart for details).     No neuro deficit on exam or by history to suggest emergent or surgical presentation.  Also discussed worsened sx that should prompt immediate re-evaluation including distal weakness, bowel/bladder retention/incontinence.      Final Clinical Impressions(s) / ED Diagnoses   Final diagnoses:  Chronic right-sided low back pain with right-sided sciatica    New Prescriptions New Prescriptions   METHOCARBAMOL (ROBAXIN) 500 MG TABLET    Take 1 tablet (500 mg total) by mouth 4 (four) times daily.   PREDNISONE (DELTASONE) 10 MG TABLET    Take 6 tablets day one, 5 tablets day two, 4 tablets day three, 3 tablets day four, 2 tablets day five, then 1 tablet day six     Landis Martins 12/12/16 1118    Noemi Chapel, MD 12/20/16 0830

## 2016-12-12 NOTE — Telephone Encounter (Signed)
Has pt seen ortho for this? If not we need to refer her to Ortho so they can order this.

## 2016-12-12 NOTE — Discharge Instructions (Signed)
Take your next dose of prednisone tomorrow morning and get 2 more doses of the muscle relaxer today. Avoid lifting,  Bending,  Twisting or any other activity that worsens your pain over the next week.  Apply an  icepack  to your lower back for 10-15 minutes every 2 hours for the next 2 days.  You should get rechecked if your symptoms are not better over the next 5 days,  Or you develop increased pain,  Weakness in your leg(s) or loss of bladder or bowel function - these are symptoms of a worse injury.

## 2016-12-12 NOTE — ED Triage Notes (Signed)
Pt comes in with lower back pain and right hip pain that shoots down her right leg. States she had a fall around 1 month ago.

## 2016-12-12 NOTE — Telephone Encounter (Signed)
What type of referral do you need? Mri of low back and right hip  Have you been seen at our office for this problem? yes (If no, schedule them an appointment.  They will need to be seen before a referral can be done.)  Is there a particular doctor or location that you prefer? [name]  Patient notified that referrals can take up to a week or longer to process. If they haven't heard anything within a week they should call back and speak with the referral department.

## 2016-12-12 NOTE — Telephone Encounter (Signed)
Attempted to contact patient - NVM 

## 2017-01-06 ENCOUNTER — Ambulatory Visit (INDEPENDENT_AMBULATORY_CARE_PROVIDER_SITE_OTHER): Payer: Medicaid Other | Admitting: Family

## 2017-01-06 ENCOUNTER — Encounter: Payer: Self-pay | Admitting: Family

## 2017-01-06 VITALS — BP 119/72 | HR 77 | Temp 97.3°F | Ht 59.0 in | Wt 144.6 lb

## 2017-01-06 DIAGNOSIS — K5900 Constipation, unspecified: Secondary | ICD-10-CM

## 2017-01-06 DIAGNOSIS — E1142 Type 2 diabetes mellitus with diabetic polyneuropathy: Secondary | ICD-10-CM | POA: Diagnosis not present

## 2017-01-06 DIAGNOSIS — F411 Generalized anxiety disorder: Secondary | ICD-10-CM | POA: Diagnosis not present

## 2017-01-06 DIAGNOSIS — F321 Major depressive disorder, single episode, moderate: Secondary | ICD-10-CM

## 2017-01-06 DIAGNOSIS — K219 Gastro-esophageal reflux disease without esophagitis: Secondary | ICD-10-CM

## 2017-01-06 DIAGNOSIS — G8929 Other chronic pain: Secondary | ICD-10-CM

## 2017-01-06 DIAGNOSIS — D509 Iron deficiency anemia, unspecified: Secondary | ICD-10-CM

## 2017-01-06 DIAGNOSIS — Z23 Encounter for immunization: Secondary | ICD-10-CM | POA: Diagnosis not present

## 2017-01-06 DIAGNOSIS — E785 Hyperlipidemia, unspecified: Secondary | ICD-10-CM | POA: Diagnosis not present

## 2017-01-06 DIAGNOSIS — E663 Overweight: Secondary | ICD-10-CM | POA: Diagnosis not present

## 2017-01-06 DIAGNOSIS — M545 Low back pain: Secondary | ICD-10-CM | POA: Diagnosis not present

## 2017-01-06 LAB — BAYER DCA HB A1C WAIVED: HB A1C (BAYER DCA - WAIVED): 6.8 % (ref <7.0)

## 2017-01-06 NOTE — Patient Instructions (Signed)

## 2017-01-06 NOTE — Progress Notes (Signed)
Subjective:    Patient ID: Charlotte Perez, female    DOB: 1960/07/16, 56 y.o.   MRN: [id number]  PT presents to the office today for chronic follow up. Pt is followed by Behavioral health for ADHD, GAD, and Depression every 3 months.   Hyperlipidemia  This is a chronic problem. The current episode started more than 1 year ago. The problem is controlled. Recent lipid tests were reviewed and are normal. Associated symptoms include leg pain. Pertinent negatives include no chest pain. Current antihyperlipidemic treatment includes statins. The current treatment provides moderate improvement of lipids. Risk factors for coronary artery disease include diabetes mellitus, dyslipidemia, obesity and a sedentary lifestyle.  Gastroesophageal Reflux  She reports no belching, no chest pain, no coughing or no heartburn. This is a chronic problem. The current episode started more than 1 year ago. The problem occurs occasionally. The problem has been waxing and waning. She has tried a PPI for the symptoms. The treatment provided moderate relief.  Diabetes  She presents for her follow-up diabetic visit. She has type 2 diabetes mellitus. Her disease course has been stable. There are no hypoglycemic associated symptoms. Pertinent negatives for hypoglycemia include no confusion. Associated symptoms include foot paresthesias and weakness. Pertinent negatives for diabetes include no blurred vision, no chest pain and no foot ulcerations. There are no hypoglycemic complications. Diabetic complications include peripheral neuropathy. Pertinent negatives for diabetic complications include no CVA, heart disease or nephropathy. Risk factors for coronary artery disease include diabetes mellitus, dyslipidemia, obesity, sedentary lifestyle and post-menopausal. She is following a generally healthy diet. Her breakfast blood glucose range is generally 110-130 mg/dl. An ACE inhibitor/angiotensin II receptor blocker is being taken.    Anemia  Presents for follow-up visit. Symptoms include malaise/fatigue. There has been no confusion or fever.  Back Pain  This is a chronic problem. The current episode started more than 1 year ago. The problem occurs intermittently. The problem has been waxing and waning since onset. The quality of the pain is described as aching. The pain is at a severity of 10/10. The pain is moderate. Associated symptoms include leg pain, numbness and weakness. Pertinent negatives include no chest pain or fever. She has tried bed rest, NSAIDs, home exercises and muscle relaxant for the symptoms. The treatment provided mild relief.  Constipation  This is a chronic problem. The current episode started more than 1 year ago. The problem has been waxing and waning since onset. Her stool frequency is 4 to 5 times per week. Associated symptoms include back pain. Pertinent negatives include no fever. She has tried laxatives for the symptoms. The treatment provided moderate relief.  Diabetic Neuropathy PT complains of numbness in left hand and bilateral feet.    Review of Systems  Constitutional: Positive for malaise/fatigue. Negative for fever.  Eyes: Negative for blurred vision.  Respiratory: Negative for cough.   Cardiovascular: Negative for chest pain.  Gastrointestinal: Positive for constipation. Negative for heartburn.  Musculoskeletal: Positive for back pain.  Neurological: Positive for weakness and numbness.  Psychiatric/Behavioral: Negative for confusion.  All other systems reviewed and are negative.      Objective:   Physical Exam  Constitutional: She is oriented to person, place, and time. She appears well-developed and well-nourished. No distress.  HENT:  Head: Normocephalic and atraumatic.  Right Ear: External ear normal.  Left Ear: External ear normal.  Nose: Nose normal.  Mouth/Throat: Oropharynx is clear and moist.  Eyes: Pupils are equal, round, and reactive to light.  Neck: Normal  range of motion. Neck supple. No thyromegaly present.  Cardiovascular: Normal rate, regular rhythm, normal heart sounds and intact distal pulses.   No murmur heard. Pulmonary/Chest: Effort normal and breath sounds normal. No respiratory distress. She has no wheezes.  Abdominal: Soft. Bowel sounds are normal. She exhibits no distension. There is no tenderness.  Musculoskeletal: Normal range of motion. She exhibits tenderness (pain in lower lumbar with flexion or extension). She exhibits no edema.  Neurological: She is alert and oriented to person, place, and time.  Skin: Skin is warm and dry.  Psychiatric: She has a normal mood and affect. Her behavior is normal. Judgment and thought content normal.  Vitals reviewed.     BP 119/72   Pulse 77   Temp (!) 97.3 F (36.3 C) (Oral)   Ht _0  (1.499 m)   Wt 144 lb 9.6 oz (65.6 kg)   BMI 29.21 kg/m      Assessment & Plan:  1. Type 2 diabetes mellitus with diabetic polyneuropathy, without long-term current use of insulin (HCC) - Bayer DCA Hb A1c Waived - CMP14+EGFR  2. Diabetic peripheral neuropathy (HCC) - CMP14+EGFR  3. GAD (generalized anxiety disorder) - CMP14+EGFR  4. Gastroesophageal reflux disease without esophagitis - CMP14+EGFR  5. Hyperlipidemia, unspecified hyperlipidemia type - CMP14+EGFR - Lipid panel  6. Iron deficiency anemia, unspecified iron deficiency anemia type - CMP14+EGFR - Anemia Profile B  7. Overweight (BMI 25.0-29.9) - CMP14+EGFR  8. Chronic midline low back pain without sciatica  - CMP14+EGFR  9. Constipation, unspecified constipation type - CMP14+EGFR  10. Current moderate episode of major depressive disorder without prior episode ([place]) - CMP14+EGFR   Continue all meds Labs pending Health Maintenance reviewed Diet and exercise encouraged RTO 4 months   Evelina Dun, FNP

## 2017-01-07 LAB — ANEMIA PROFILE B
BASOS ABS: 0 10*3/uL (ref 0.0–0.2)
Basos: 1 %
EOS (ABSOLUTE): 0.1 10*3/uL (ref 0.0–0.4)
Eos: 2 %
Ferritin: 24 ng/mL (ref 15–150)
Folate: 19.2 ng/mL (ref >3.0)
HEMOGLOBIN: 12.5 g/dL (ref 11.1–15.9)
Hematocrit: 38.5 % (ref 34.0–46.6)
IMMATURE GRANS (ABS): 0 10*3/uL (ref 0.0–0.1)
IMMATURE GRANULOCYTES: 0 %
IRON SATURATION: 25 % (ref 15–55)
Iron: 97 ug/dL (ref 27–159)
LYMPHS ABS: 1.3 10*3/uL (ref 0.7–3.1)
LYMPHS: 32 %
MCH: 30.2 pg (ref 26.6–33.0)
MCHC: 32.5 g/dL (ref 31.5–35.7)
MCV: 93 fL (ref 79–97)
MONOS ABS: 0.4 10*3/uL (ref 0.1–0.9)
Monocytes: 9 %
NEUTROS PCT: 56 %
Neutrophils Absolute: 2.3 10*3/uL (ref 1.4–7.0)
PLATELETS: 222 10*3/uL (ref 150–379)
RBC: 4.14 x10E6/uL (ref 3.77–5.28)
RDW: 14 % (ref 12.3–15.4)
Retic Ct Pct: 0.9 % (ref 0.6–2.6)
Total Iron Binding Capacity: 386 ug/dL (ref 250–450)
UIBC: 289 ug/dL (ref 131–425)
Vitamin B-12: 191 pg/mL — ABNORMAL LOW (ref 232–1245)
WBC: 4 10*3/uL (ref 3.4–10.8)

## 2017-01-07 LAB — LIPID PANEL
CHOL/HDL RATIO: 2.5 ratio (ref 0.0–4.4)
CHOLESTEROL TOTAL: 189 mg/dL (ref 100–199)
HDL: 76 mg/dL (ref >39)
LDL CALC: 88 mg/dL (ref 0–99)
TRIGLYCERIDES: 125 mg/dL (ref 0–149)
VLDL CHOLESTEROL CAL: 25 mg/dL (ref 5–40)

## 2017-01-07 LAB — CMP14+EGFR
ALBUMIN: 3.6 g/dL (ref 3.5–5.5)
ALK PHOS: 44 IU/L (ref 39–117)
ALT: 11 IU/L (ref 0–32)
AST: 9 IU/L (ref 0–40)
Albumin/Globulin Ratio: 1.5 (ref 1.2–2.2)
BUN/Creatinine Ratio: 25 — ABNORMAL HIGH (ref 9–23)
BUN: 19 mg/dL (ref 6–24)
Bilirubin Total: 0.2 mg/dL (ref 0.0–1.2)
CO2: 23 mmol/L (ref 20–29)
CREATININE: 0.77 mg/dL (ref 0.57–1.00)
Calcium: 9.2 mg/dL (ref 8.7–10.2)
Chloride: 101 mmol/L (ref 96–106)
GFR calc Af Amer: 100 mL/min/{1.73_m2} (ref >59)
GFR calc non Af Amer: 87 mL/min/{1.73_m2} (ref >59)
GLOBULIN, TOTAL: 2.4 g/dL (ref 1.5–4.5)
Glucose: 120 mg/dL — ABNORMAL HIGH (ref 65–99)
Potassium: 4.7 mmol/L (ref 3.5–5.2)
SODIUM: 141 mmol/L (ref 134–144)
Total Protein: 6 g/dL (ref 6.0–8.5)

## 2017-02-11 ENCOUNTER — Ambulatory Visit (INDEPENDENT_AMBULATORY_CARE_PROVIDER_SITE_OTHER): Payer: Medicaid Other

## 2017-02-11 ENCOUNTER — Ambulatory Visit: Payer: Medicaid Other | Admitting: Family

## 2017-02-11 ENCOUNTER — Encounter: Payer: Self-pay | Admitting: Family

## 2017-02-11 VITALS — BP 129/80 | HR 90 | Temp 97.4°F | Ht 59.0 in | Wt 143.2 lb

## 2017-02-11 DIAGNOSIS — M5442 Lumbago with sciatica, left side: Secondary | ICD-10-CM

## 2017-02-11 DIAGNOSIS — M5441 Lumbago with sciatica, right side: Secondary | ICD-10-CM

## 2017-02-11 DIAGNOSIS — Y92009 Unspecified place in unspecified non-institutional (private) residence as the place of occurrence of the external cause: Secondary | ICD-10-CM

## 2017-02-11 DIAGNOSIS — G8929 Other chronic pain: Secondary | ICD-10-CM

## 2017-02-11 DIAGNOSIS — W19XXXA Unspecified fall, initial encounter: Secondary | ICD-10-CM

## 2017-02-11 MED ORDER — CYCLOBENZAPRINE HCL 10 MG PO TABS: 10.0000 mg | ORAL_TABLET | Freq: Two times a day (BID) | ORAL | 1 refills | Status: DC | PRN

## 2017-02-11 MED ORDER — NAPROXEN 500 MG PO TABS: 500.0000 mg | ORAL_TABLET | Freq: Two times a day (BID) | ORAL | 1 refills | Status: DC

## 2017-02-11 MED ORDER — PREDNISONE 10 MG (21) PO TBPK: ORAL_TABLET | ORAL | 0 refills | Status: DC

## 2017-02-11 NOTE — Addendum Note (Signed)
Addended by: Evelina Dun A on: 02/11/2017 11:01 AM   Modules accepted: Orders

## 2017-02-11 NOTE — Progress Notes (Addendum)
   Subjective:    Patient ID: Charlotte Perez, female    DOB: 1960-06-22, 56 y.o.   MRN: [id number]  Pt presents to the office today with low back pain after a fall.  Fall  The accident occurred 5 to 7 days ago. The fall occurred while standing. She fell from a height of 3 to 5 ft. She landed on grass. There was no blood loss. Point of impact: left arm. Pain location: low back pain. The pain is at a severity of 10/10. The pain is moderate. The symptoms are aggravated by standing and sitting. Pertinent negatives include no bowel incontinence, fever, hematuria, loss of consciousness, numbness, tingling, visual change or vomiting. She has tried rest, NSAID and acetaminophen for the symptoms. The treatment provided mild relief.      Review of Systems  Constitutional: Negative for fever.  Gastrointestinal: Negative for bowel incontinence and vomiting.  Genitourinary: Negative for hematuria.  Neurological: Negative for tingling, loss of consciousness and numbness.  All other systems reviewed and are negative.      Objective:   Physical Exam  Constitutional: She is oriented to person, place, and time. She appears well-developed and well-nourished. No distress.  HENT:  Head: Normocephalic and atraumatic.  Right Ear: External ear normal.  Mouth/Throat: Oropharynx is clear and moist.  Eyes: Pupils are equal, round, and reactive to light.  Neck: Normal range of motion. Neck supple. No thyromegaly present.  Cardiovascular: Normal rate, regular rhythm, normal heart sounds and intact distal pulses.  No murmur heard. Pulmonary/Chest: Effort normal and breath sounds normal. No respiratory distress. She has no wheezes.  Abdominal: Soft. Bowel sounds are normal. She exhibits no distension. There is no tenderness.  Musculoskeletal: Normal range of motion. She exhibits no edema or tenderness.  Neurological: She is alert and oriented to person, place, and time.  Skin: Skin is warm and dry.    Psychiatric: She has a normal mood and affect. Her behavior is normal. Judgment and thought content normal.  Vitals reviewed.   Lumbar x-ray- Moderate stool, Preliminary reading by Evelina Dun, FNP [facility]   BP 129/80   Pulse 90   Temp (!) 97.4 F (36.3 C) (Oral)   Ht 4\' 11"  (1.499 m)   Wt 143 lb 3.2 oz (65 kg)   BMI 28.92 kg/m      Assessment & Plan:  1. Chronic bilateral low back pain with bilateral sciatica Rest Ice and heat as needed ROM exercises discussed Low carb diet Referral to Pain ordered - DG Lumbar Spine 2-3 Views; Future - Ambulatory referral to Pain Clinic - predniSONE (STERAPRED UNI-PAK 21 TAB) 10 MG (21) TBPK tablet; Use as directed  Dispense: 21 tablet; Refill: 0 - naproxen (NAPROSYN) 500 MG tablet; Take 1 tablet (500 mg total) by mouth 2 (two) times daily with a meal.  Dispense: 60 tablet; Refill: 1  2. Fall in home, initial encounter Falls precautions discussed  - DG Lumbar Spine 2-3 Views; Future     Evelina Dun, FNP

## 2017-03-07 ENCOUNTER — Other Ambulatory Visit: Payer: Self-pay | Admitting: Family

## 2017-03-07 ENCOUNTER — Other Ambulatory Visit (HOSPITAL_COMMUNITY): Payer: Self-pay | Admitting: Psychiatry

## 2017-03-07 DIAGNOSIS — E1165 Type 2 diabetes mellitus with hyperglycemia: Secondary | ICD-10-CM

## 2017-03-07 DIAGNOSIS — K5900 Constipation, unspecified: Secondary | ICD-10-CM

## 2017-03-07 DIAGNOSIS — E1142 Type 2 diabetes mellitus with diabetic polyneuropathy: Secondary | ICD-10-CM

## 2017-03-12 ENCOUNTER — Ambulatory Visit (HOSPITAL_COMMUNITY): Payer: Medicaid Other | Admitting: Psychiatry

## 2017-03-12 ENCOUNTER — Encounter (HOSPITAL_COMMUNITY): Payer: Self-pay | Admitting: Psychiatry

## 2017-03-12 VITALS — BP 104/71 | HR 83 | Ht 59.0 in | Wt 149.0 lb

## 2017-03-12 DIAGNOSIS — M549 Dorsalgia, unspecified: Secondary | ICD-10-CM

## 2017-03-12 DIAGNOSIS — F321 Major depressive disorder, single episode, moderate: Secondary | ICD-10-CM

## 2017-03-12 DIAGNOSIS — Z736 Limitation of activities due to disability: Secondary | ICD-10-CM | POA: Diagnosis not present

## 2017-03-12 DIAGNOSIS — Z811 Family history of alcohol abuse and dependence: Secondary | ICD-10-CM | POA: Diagnosis not present

## 2017-03-12 DIAGNOSIS — Z818 Family history of other mental and behavioral disorders: Secondary | ICD-10-CM

## 2017-03-12 MED ORDER — VENLAFAXINE HCL ER 150 MG PO CP24: 300.0000 mg | ORAL_CAPSULE | Freq: Every day | ORAL | 2 refills | Status: DC

## 2017-03-12 MED ORDER — ALPRAZOLAM 1 MG PO TABS: 1.0000 mg | ORAL_TABLET | Freq: Every evening | ORAL | 2 refills | Status: DC | PRN
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
## 2017-04-09 ENCOUNTER — Other Ambulatory Visit: Payer: Self-pay | Admitting: Family

## 2017-04-09 NOTE — Telephone Encounter (Signed)
Last seen  02/11/17

## 2017-04-14 ENCOUNTER — Other Ambulatory Visit: Payer: Self-pay | Admitting: Family

## 2017-05-07 ENCOUNTER — Other Ambulatory Visit: Payer: Self-pay | Admitting: Family

## 2017-05-09 ENCOUNTER — Encounter: Payer: Self-pay | Admitting: Family

## 2017-05-09 ENCOUNTER — Ambulatory Visit: Payer: Medicaid Other | Admitting: Family

## 2017-05-09 VITALS — BP 107/78 | HR 80 | Temp 97.3°F | Ht 59.0 in | Wt 150.0 lb

## 2017-05-09 DIAGNOSIS — F411 Generalized anxiety disorder: Secondary | ICD-10-CM

## 2017-05-09 DIAGNOSIS — G8929 Other chronic pain: Secondary | ICD-10-CM | POA: Diagnosis not present

## 2017-05-09 DIAGNOSIS — M545 Low back pain: Secondary | ICD-10-CM | POA: Diagnosis not present

## 2017-05-09 DIAGNOSIS — E785 Hyperlipidemia, unspecified: Secondary | ICD-10-CM | POA: Diagnosis not present

## 2017-05-09 DIAGNOSIS — F331 Major depressive disorder, recurrent, moderate: Secondary | ICD-10-CM | POA: Diagnosis not present

## 2017-05-09 DIAGNOSIS — E669 Obesity, unspecified: Secondary | ICD-10-CM | POA: Diagnosis not present

## 2017-05-09 DIAGNOSIS — E1142 Type 2 diabetes mellitus with diabetic polyneuropathy: Secondary | ICD-10-CM | POA: Diagnosis not present

## 2017-05-09 DIAGNOSIS — E1169 Type 2 diabetes mellitus with other specified complication: Secondary | ICD-10-CM | POA: Diagnosis not present

## 2017-05-09 DIAGNOSIS — K219 Gastro-esophageal reflux disease without esophagitis: Secondary | ICD-10-CM | POA: Diagnosis not present

## 2017-05-09 DIAGNOSIS — D509 Iron deficiency anemia, unspecified: Secondary | ICD-10-CM | POA: Diagnosis not present

## 2017-05-09 LAB — BAYER DCA HB A1C WAIVED: HB A1C (BAYER DCA - WAIVED): 6.6 % (ref <7.0)

## 2017-05-09 NOTE — Progress Notes (Signed)
Subjective:    Patient ID: Charlotte Perez, female    DOB: June 28, 1960, 57 y.o.   MRN: [id number]  PT presents to the office today for chronic follow up. Pt is followed by Behavioral health for ADHD, GAD, and Depression every 3 months.  Pt is followed by Pain Clinic every month for chronic back pain. All these are stable and doing "well".  Diabetes  She presents for her follow-up diabetic visit. She has type 2 diabetes mellitus. Her disease course has been stable. There are no hypoglycemic associated symptoms. Associated symptoms include foot paresthesias. Pertinent negatives for diabetes include no blurred vision and no visual change. There are no hypoglycemic complications. Symptoms are stable. Pertinent negatives for diabetic complications include no CVA, heart disease, nephropathy or peripheral neuropathy. Risk factors for coronary artery disease include diabetes mellitus, dyslipidemia and sedentary lifestyle. She is following a generally unhealthy diet. Her breakfast blood glucose range is generally 140-180 mg/dl. Eye exam is not current (Has it scheduled for next month).  Hypertension  This is a chronic problem. The current episode started more than 1 year ago. The problem has been resolved since onset. The problem is controlled. Associated symptoms include malaise/fatigue. Pertinent negatives include no blurred vision, peripheral edema or shortness of breath. Risk factors for coronary artery disease include diabetes mellitus, dyslipidemia, obesity, post-menopausal state and sedentary lifestyle. The current treatment provides moderate improvement. There is no history of CVA.  Hyperlipidemia  This is a chronic problem. The current episode started more than 1 year ago. The problem is uncontrolled. Recent lipid tests were reviewed and are high. Pertinent negatives include no shortness of breath. Current antihyperlipidemic treatment includes statins. The current treatment provides moderate improvement  of lipids. Risk factors for coronary artery disease include diabetes mellitus, dyslipidemia, hypertension, female sex and a sedentary lifestyle.  Gastroesophageal Reflux  She reports no belching, no coughing or no heartburn. This is a chronic problem. The current episode started more than 1 year ago. The problem has been waxing and waning. She has tried a PPI for the symptoms. The treatment provided moderate relief.  Anemia  Presents for follow-up visit. Symptoms include malaise/fatigue. There has been no anorexia or bruising/bleeding easily.      Review of Systems  Constitutional: Positive for malaise/fatigue.  Eyes: Negative for blurred vision.  Respiratory: Negative for cough and shortness of breath.   Gastrointestinal: Negative for anorexia and heartburn.  Hematological: Does not bruise/bleed easily.  All other systems reviewed and are negative.      Objective:   Physical Exam  Constitutional: She is oriented to person, place, and time. She appears well-developed and well-nourished. No distress.  HENT:  Head: Normocephalic and atraumatic.  Right Ear: External ear normal.  Mouth/Throat: Oropharynx is clear and moist.  Eyes: Pupils are equal, round, and reactive to light.  Neck: Normal range of motion. Neck supple. No thyromegaly present.  Cardiovascular: Normal rate, regular rhythm, normal heart sounds and intact distal pulses.  No murmur heard. Pulmonary/Chest: Effort normal and breath sounds normal. No respiratory distress. She has no wheezes.  Abdominal: Soft. Bowel sounds are normal. She exhibits no distension. There is no tenderness.  Musculoskeletal: Normal range of motion. She exhibits no edema or tenderness.  Neurological: She is alert and oriented to person, place, and time.  Skin: Skin is warm and dry.  Psychiatric: She has a normal mood and affect. Her behavior is normal. Judgment and thought content normal.  Vitals reviewed.     BP 107/78  Pulse 80   Temp (!)  97.3 F (36.3 C) (Oral)   Ht 4' 11"  (1.499 m)   Wt 150 lb (68 kg)   BMI 30.30 kg/m      Assessment & Plan:  1. Type 2 diabetes mellitus with diabetic polyneuropathy, without long-term current use of insulin (HCC) - Bayer DCA Hb A1c Waived - CMP14+EGFR  2. Chronic midline low back pain without sciatica - CMP14+EGFR  3. Obesity (BMI 30-39.9)  - CMP14+EGFR  4. Hyperlipidemia associated with type 2 diabetes mellitus (HCC) - CMP14+EGFR - Lipid panel  5. Gastroesophageal reflux disease without esophagitis - CMP14+EGFR  6. Moderate episode of recurrent major depressive disorder (HCC) - CMP14+EGFR  7. GAD (generalized anxiety disorder) - CMP14+EGFR  8. Iron deficiency anemia, unspecified iron deficiency anemia type  - CMP14+EGFR - Anemia Profile B   Continue all meds Labs pending Health Maintenance reviewed Diet and exercise encouraged RTO 4 months   Evelina Dun, FNP

## 2017-05-09 NOTE — Patient Instructions (Signed)
Diabetes Mellitus and Nutrition When you have diabetes (diabetes mellitus), it is very important to have healthy eating habits because your blood sugar (glucose) levels are greatly affected by what you eat and drink. Eating healthy foods in the appropriate amounts, at about the same times every day, can help you:  Control your blood glucose.  Lower your risk of heart disease.  Improve your blood pressure.  Reach or maintain a healthy weight.  Every person with diabetes is different, and each person has different needs for a meal plan. Your health care provider may recommend that you work with a diet and nutrition specialist (dietitian) to make a meal plan that is best for you. Your meal plan may vary depending on factors such as:  The calories you need.  The medicines you take.  Your weight.  Your blood glucose, blood pressure, and cholesterol levels.  Your activity level.  Other health conditions you have, such as heart or kidney disease.  How do carbohydrates affect me? Carbohydrates affect your blood glucose level more than any other type of food. Eating carbohydrates naturally increases the amount of glucose in your blood. Carbohydrate counting is a method for keeping track of how many carbohydrates you eat. Counting carbohydrates is important to keep your blood glucose at a healthy level, especially if you use insulin or take certain oral diabetes medicines. It is important to know how many carbohydrates you can safely have in each meal. This is different for every person. Your dietitian can help you calculate how many carbohydrates you should have at each meal and for snack. Foods that contain carbohydrates include:  Bread, cereal, rice, pasta, and crackers.  Potatoes and corn.  Peas, beans, and lentils.  Milk and yogurt.  Fruit and juice.  Desserts, such as cakes, cookies, ice cream, and candy.  How does alcohol affect me? Alcohol can cause a sudden decrease in blood  glucose (hypoglycemia), especially if you use insulin or take certain oral diabetes medicines. Hypoglycemia can be a life-threatening condition. Symptoms of hypoglycemia (sleepiness, dizziness, and confusion) are similar to symptoms of having too much alcohol. If your health care provider says that alcohol is safe for you, follow these guidelines:  Limit alcohol intake to no more than 1 drink per day for nonpregnant women and 2 drinks per day for men. One drink equals 12 oz of beer, 5 oz of wine, or 1 oz of hard liquor.  Do not drink on an empty stomach.  Keep yourself hydrated with water, diet soda, or unsweetened iced tea.  Keep in mind that regular soda, juice, and other mixers may contain a lot of sugar and must be counted as carbohydrates.  What are tips for following this plan? Reading food labels  Start by checking the serving size on the label. The amount of calories, carbohydrates, fats, and other nutrients listed on the label are based on one serving of the food. Many foods contain more than one serving per package.  Check the total grams (g) of carbohydrates in one serving. You can calculate the number of servings of carbohydrates in one serving by dividing the total carbohydrates by 15. For example, if a food has 30 g of total carbohydrates, it would be equal to 2 servings of carbohydrates.  Check the number of grams (g) of saturated and trans fats in one serving. Choose foods that have low or no amount of these fats.  Check the number of milligrams (mg) of sodium in one serving. Most people   should limit total sodium intake to less than 2,300 mg per day.  Always check the nutrition information of foods labeled as "low-fat" or "nonfat". These foods may be higher in added sugar or refined carbohydrates and should be avoided.  Talk to your dietitian to identify your daily goals for nutrients listed on the label. Shopping  Avoid buying canned, premade, or processed foods. These  foods tend to be high in fat, sodium, and added sugar.  Shop around the outside edge of the grocery store. This includes fresh fruits and vegetables, bulk grains, fresh meats, and fresh dairy. Cooking  Use low-heat cooking methods, such as baking, instead of high-heat cooking methods like deep frying.  Cook using healthy oils, such as olive, canola, or sunflower oil.  Avoid cooking with butter, cream, or high-fat meats. Meal planning  Eat meals and snacks regularly, preferably at the same times every day. Avoid going long periods of time without eating.  Eat foods high in fiber, such as fresh fruits, vegetables, beans, and whole grains. Talk to your dietitian about how many servings of carbohydrates you can eat at each meal.  Eat 4-6 ounces of lean protein each day, such as lean meat, chicken, fish, eggs, or tofu. 1 ounce is equal to 1 ounce of meat, chicken, or fish, 1 egg, or 1/4 cup of tofu.  Eat some foods each day that contain healthy fats, such as avocado, nuts, seeds, and fish. Lifestyle   Check your blood glucose regularly.  Exercise at least 30 minutes 5 or more days each week, or as told by your health care provider.  Take medicines as told by your health care provider.  Do not use any products that contain nicotine or tobacco, such as cigarettes and e-cigarettes. If you need help quitting, ask your health care provider.  Work with a counselor or diabetes educator to identify strategies to manage stress and any emotional and social challenges. What are some questions to ask my health care provider?  Do I need to meet with a diabetes educator?  Do I need to meet with a dietitian?  What number can I call if I have questions?  When are the best times to check my blood glucose? Where to find more information:  American Diabetes Association: diabetes.org/food-and-fitness/food  Academy of Nutrition and Dietetics:  www.eatright.org/resources/health/diseases-and-conditions/diabetes  National Institute of Diabetes and Digestive and Kidney Diseases (NIH): www.niddk.nih.gov/health-information/diabetes/overview/diet-eating-physical-activity Summary  A healthy meal plan will help you control your blood glucose and maintain a healthy lifestyle.  Working with a diet and nutrition specialist (dietitian) can help you make a meal plan that is best for you.  Keep in mind that carbohydrates and alcohol have immediate effects on your blood glucose levels. It is important to count carbohydrates and to use alcohol carefully. This information is not intended to replace advice given to you by your health care provider. Make sure you discuss any questions you have with your health care provider. Document Released: 12/06/2004 Document Revised: 04/15/2016 Document Reviewed: 04/15/2016 Elsevier Interactive Patient Education  2018 Elsevier Inc.  

## 2017-05-10 LAB — ANEMIA PROFILE B
BASOS ABS: 0 10*3/uL (ref 0.0–0.2)
Basos: 1 %
EOS (ABSOLUTE): 0.1 10*3/uL (ref 0.0–0.4)
Eos: 3 %
FERRITIN: 16 ng/mL (ref 15–150)
Folate: 15.5 ng/mL (ref >3.0)
Hematocrit: 40.2 % (ref 34.0–46.6)
Hemoglobin: 13.1 g/dL (ref 11.1–15.9)
IMMATURE GRANULOCYTES: 0 %
Immature Grans (Abs): 0 10*3/uL (ref 0.0–0.1)
Iron Saturation: 20 % (ref 15–55)
Iron: 86 ug/dL (ref 27–159)
Lymphocytes Absolute: 1.7 10*3/uL (ref 0.7–3.1)
Lymphs: 40 %
MCH: 29.9 pg (ref 26.6–33.0)
MCHC: 32.6 g/dL (ref 31.5–35.7)
MCV: 92 fL (ref 79–97)
MONOCYTES: 11 %
MONOS ABS: 0.5 10*3/uL (ref 0.1–0.9)
NEUTROS PCT: 45 %
Neutrophils Absolute: 1.8 10*3/uL (ref 1.4–7.0)
PLATELETS: 243 10*3/uL (ref 150–379)
RBC: 4.38 x10E6/uL (ref 3.77–5.28)
RDW: 12.9 % (ref 12.3–15.4)
RETIC CT PCT: 0.8 % (ref 0.6–2.6)
Total Iron Binding Capacity: 425 ug/dL (ref 250–450)
UIBC: 339 ug/dL (ref 131–425)
Vitamin B-12: 215 pg/mL — ABNORMAL LOW (ref 232–1245)
WBC: 4.1 10*3/uL (ref 3.4–10.8)

## 2017-05-10 LAB — LIPID PANEL
CHOL/HDL RATIO: 2.6 ratio (ref 0.0–4.4)
CHOLESTEROL TOTAL: 204 mg/dL — AB (ref 100–199)
HDL: 77 mg/dL (ref >39)
LDL CALC: 104 mg/dL — AB (ref 0–99)
TRIGLYCERIDES: 117 mg/dL (ref 0–149)
VLDL CHOLESTEROL CAL: 23 mg/dL (ref 5–40)

## 2017-05-10 LAB — CMP14+EGFR
ALBUMIN: 4.3 g/dL (ref 3.5–5.5)
ALT: 10 IU/L (ref 0–32)
AST: 13 IU/L (ref 0–40)
Albumin/Globulin Ratio: 2 (ref 1.2–2.2)
Alkaline Phosphatase: 62 IU/L (ref 39–117)
BUN/Creatinine Ratio: 30 — ABNORMAL HIGH (ref 9–23)
BUN: 19 mg/dL (ref 6–24)
Bilirubin Total: <0.2 mg/dL (ref 0.0–1.2)
CALCIUM: 9.6 mg/dL (ref 8.7–10.2)
CO2: 22 mmol/L (ref 20–29)
CREATININE: 0.64 mg/dL (ref 0.57–1.00)
Chloride: 100 mmol/L (ref 96–106)
GFR calc Af Amer: 115 mL/min/{1.73_m2} (ref >59)
GFR, EST NON AFRICAN AMERICAN: 99 mL/min/{1.73_m2} (ref >59)
GLUCOSE: 95 mg/dL (ref 65–99)
Globulin, Total: 2.2 g/dL (ref 1.5–4.5)
Potassium: 5 mmol/L (ref 3.5–5.2)
Sodium: 140 mmol/L (ref 134–144)
Total Protein: 6.5 g/dL (ref 6.0–8.5)

## 2017-05-12 ENCOUNTER — Telehealth: Payer: Self-pay | Admitting: Family

## 2017-05-12 ENCOUNTER — Other Ambulatory Visit: Payer: Self-pay | Admitting: Family

## 2017-05-12 MED ORDER — ATORVASTATIN CALCIUM 20 MG PO TABS: 20.0000 mg | ORAL_TABLET | Freq: Every day | ORAL | 1 refills | Status: DC

## 2017-05-12 NOTE — Telephone Encounter (Signed)
Refer to lab notes 

## 2017-06-05 ENCOUNTER — Other Ambulatory Visit: Payer: Self-pay | Admitting: Family

## 2017-06-05 DIAGNOSIS — E1165 Type 2 diabetes mellitus with hyperglycemia: Secondary | ICD-10-CM

## 2017-06-05 DIAGNOSIS — K5900 Constipation, unspecified: Secondary | ICD-10-CM

## 2017-06-09 ENCOUNTER — Other Ambulatory Visit: Payer: Self-pay | Admitting: Family

## 2017-06-09 DIAGNOSIS — M5441 Lumbago with sciatica, right side: Principal | ICD-10-CM

## 2017-06-09 DIAGNOSIS — G8929 Other chronic pain: Secondary | ICD-10-CM

## 2017-06-09 DIAGNOSIS — M5442 Lumbago with sciatica, left side: Principal | ICD-10-CM

## 2017-06-10 ENCOUNTER — Encounter (HOSPITAL_COMMUNITY): Payer: Self-pay | Admitting: Psychiatry

## 2017-06-10 ENCOUNTER — Ambulatory Visit (HOSPITAL_COMMUNITY): Payer: Medicaid Other | Admitting: Psychiatry

## 2017-06-10 VITALS — BP 113/80 | HR 80 | Ht 59.0 in | Wt 150.0 lb

## 2017-06-10 DIAGNOSIS — Z818 Family history of other mental and behavioral disorders: Secondary | ICD-10-CM | POA: Diagnosis not present

## 2017-06-10 DIAGNOSIS — Z91410 Personal history of adult physical and sexual abuse: Secondary | ICD-10-CM | POA: Diagnosis not present

## 2017-06-10 DIAGNOSIS — M549 Dorsalgia, unspecified: Secondary | ICD-10-CM

## 2017-06-10 DIAGNOSIS — Z915 Personal history of self-harm: Secondary | ICD-10-CM | POA: Diagnosis not present

## 2017-06-10 DIAGNOSIS — F321 Major depressive disorder, single episode, moderate: Secondary | ICD-10-CM | POA: Diagnosis not present

## 2017-06-10 DIAGNOSIS — Z736 Limitation of activities due to disability: Secondary | ICD-10-CM | POA: Diagnosis not present

## 2017-06-10 DIAGNOSIS — Z811 Family history of alcohol abuse and dependence: Secondary | ICD-10-CM

## 2017-06-10 DIAGNOSIS — Z91411 Personal history of adult psychological abuse: Secondary | ICD-10-CM

## 2017-06-10 DIAGNOSIS — Z62810 Personal history of physical and sexual abuse in childhood: Secondary | ICD-10-CM

## 2017-06-10 MED ORDER — ALPRAZOLAM 1 MG PO TABS: 1.0000 mg | ORAL_TABLET | Freq: Every evening | ORAL | 2 refills | Status: DC | PRN

## 2017-06-10 MED ORDER — VENLAFAXINE HCL ER 150 MG PO CP24: 300.0000 mg | ORAL_CAPSULE | Freq: Every day | ORAL | 2 refills | Status: DC
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
## 2017-07-08 ENCOUNTER — Other Ambulatory Visit: Payer: Self-pay | Admitting: Family

## 2017-07-08 DIAGNOSIS — K5900 Constipation, unspecified: Secondary | ICD-10-CM

## 2017-08-06 ENCOUNTER — Other Ambulatory Visit: Payer: Self-pay | Admitting: Family

## 2017-08-06 NOTE — Telephone Encounter (Signed)
Ov 08/07/17

## 2017-08-07 ENCOUNTER — Encounter: Payer: Self-pay | Admitting: Family

## 2017-08-07 ENCOUNTER — Ambulatory Visit: Payer: Medicaid Other | Admitting: Family

## 2017-08-07 VITALS — BP 107/65 | HR 75 | Temp 97.7°F | Ht 59.0 in | Wt 144.0 lb

## 2017-08-07 DIAGNOSIS — E663 Overweight: Secondary | ICD-10-CM | POA: Diagnosis not present

## 2017-08-07 DIAGNOSIS — E1169 Type 2 diabetes mellitus with other specified complication: Secondary | ICD-10-CM | POA: Diagnosis not present

## 2017-08-07 DIAGNOSIS — K5900 Constipation, unspecified: Secondary | ICD-10-CM

## 2017-08-07 DIAGNOSIS — G43109 Migraine with aura, not intractable, without status migrainosus: Secondary | ICD-10-CM | POA: Diagnosis not present

## 2017-08-07 DIAGNOSIS — D509 Iron deficiency anemia, unspecified: Secondary | ICD-10-CM

## 2017-08-07 DIAGNOSIS — E1142 Type 2 diabetes mellitus with diabetic polyneuropathy: Secondary | ICD-10-CM | POA: Diagnosis not present

## 2017-08-07 DIAGNOSIS — E785 Hyperlipidemia, unspecified: Secondary | ICD-10-CM | POA: Diagnosis not present

## 2017-08-07 DIAGNOSIS — G8929 Other chronic pain: Secondary | ICD-10-CM

## 2017-08-07 DIAGNOSIS — F988 Other specified behavioral and emotional disorders with onset usually occurring in childhood and adolescence: Secondary | ICD-10-CM | POA: Diagnosis not present

## 2017-08-07 DIAGNOSIS — F411 Generalized anxiety disorder: Secondary | ICD-10-CM

## 2017-08-07 DIAGNOSIS — F331 Major depressive disorder, recurrent, moderate: Secondary | ICD-10-CM | POA: Diagnosis not present

## 2017-08-07 DIAGNOSIS — M545 Low back pain: Secondary | ICD-10-CM

## 2017-08-07 DIAGNOSIS — K219 Gastro-esophageal reflux disease without esophagitis: Secondary | ICD-10-CM | POA: Diagnosis not present

## 2017-08-07 LAB — BAYER DCA HB A1C WAIVED: HB A1C (BAYER DCA - WAIVED): 6.5 % (ref <7.0)

## 2017-08-07 NOTE — Patient Instructions (Signed)
Diabetes Mellitus and Nutrition When you have diabetes (diabetes mellitus), it is very important to have healthy eating habits because your blood sugar (glucose) levels are greatly affected by what you eat and drink. Eating healthy foods in the appropriate amounts, at about the same times every day, can help you:  Control your blood glucose.  Lower your risk of heart disease.  Improve your blood pressure.  Reach or maintain a healthy weight.  Every person with diabetes is different, and each person has different needs for a meal plan. Your health care provider may recommend that you work with a diet and nutrition specialist (dietitian) to make a meal plan that is best for you. Your meal plan may vary depending on factors such as:  The calories you need.  The medicines you take.  Your weight.  Your blood glucose, blood pressure, and cholesterol levels.  Your activity level.  Other health conditions you have, such as heart or kidney disease.  How do carbohydrates affect me? Carbohydrates affect your blood glucose level more than any other type of food. Eating carbohydrates naturally increases the amount of glucose in your blood. Carbohydrate counting is a method for keeping track of how many carbohydrates you eat. Counting carbohydrates is important to keep your blood glucose at a healthy level, especially if you use insulin or take certain oral diabetes medicines. It is important to know how many carbohydrates you can safely have in each meal. This is different for every person. Your dietitian can help you calculate how many carbohydrates you should have at each meal and for snack. Foods that contain carbohydrates include:  Bread, cereal, rice, pasta, and crackers.  Potatoes and corn.  Peas, beans, and lentils.  Milk and yogurt.  Fruit and juice.  Desserts, such as cakes, cookies, ice cream, and candy.  How does alcohol affect me? Alcohol can cause a sudden decrease in blood  glucose (hypoglycemia), especially if you use insulin or take certain oral diabetes medicines. Hypoglycemia can be a life-threatening condition. Symptoms of hypoglycemia (sleepiness, dizziness, and confusion) are similar to symptoms of having too much alcohol. If your health care provider says that alcohol is safe for you, follow these guidelines:  Limit alcohol intake to no more than 1 drink per day for nonpregnant women and 2 drinks per day for men. One drink equals 12 oz of beer, 5 oz of wine, or 1 oz of hard liquor.  Do not drink on an empty stomach.  Keep yourself hydrated with water, diet soda, or unsweetened iced tea.  Keep in mind that regular soda, juice, and other mixers may contain a lot of sugar and must be counted as carbohydrates.  What are tips for following this plan? Reading food labels  Start by checking the serving size on the label. The amount of calories, carbohydrates, fats, and other nutrients listed on the label are based on one serving of the food. Many foods contain more than one serving per package.  Check the total grams (g) of carbohydrates in one serving. You can calculate the number of servings of carbohydrates in one serving by dividing the total carbohydrates by 15. For example, if a food has 30 g of total carbohydrates, it would be equal to 2 servings of carbohydrates.  Check the number of grams (g) of saturated and trans fats in one serving. Choose foods that have low or no amount of these fats.  Check the number of milligrams (mg) of sodium in one serving. Most people   should limit total sodium intake to less than 2,300 mg per day.  Always check the nutrition information of foods labeled as "low-fat" or "nonfat". These foods may be higher in added sugar or refined carbohydrates and should be avoided.  Talk to your dietitian to identify your daily goals for nutrients listed on the label. Shopping  Avoid buying canned, premade, or processed foods. These  foods tend to be high in fat, sodium, and added sugar.  Shop around the outside edge of the grocery store. This includes fresh fruits and vegetables, bulk grains, fresh meats, and fresh dairy. Cooking  Use low-heat cooking methods, such as baking, instead of high-heat cooking methods like deep frying.  Cook using healthy oils, such as olive, canola, or sunflower oil.  Avoid cooking with butter, cream, or high-fat meats. Meal planning  Eat meals and snacks regularly, preferably at the same times every day. Avoid going long periods of time without eating.  Eat foods high in fiber, such as fresh fruits, vegetables, beans, and whole grains. Talk to your dietitian about how many servings of carbohydrates you can eat at each meal.  Eat 4-6 ounces of lean protein each day, such as lean meat, chicken, fish, eggs, or tofu. 1 ounce is equal to 1 ounce of meat, chicken, or fish, 1 egg, or 1/4 cup of tofu.  Eat some foods each day that contain healthy fats, such as avocado, nuts, seeds, and fish. Lifestyle   Check your blood glucose regularly.  Exercise at least 30 minutes 5 or more days each week, or as told by your health care provider.  Take medicines as told by your health care provider.  Do not use any products that contain nicotine or tobacco, such as cigarettes and e-cigarettes. If you need help quitting, ask your health care provider.  Work with a counselor or diabetes educator to identify strategies to manage stress and any emotional and social challenges. What are some questions to ask my health care provider?  Do I need to meet with a diabetes educator?  Do I need to meet with a dietitian?  What number can I call if I have questions?  When are the best times to check my blood glucose? Where to find more information:  American Diabetes Association: diabetes.org/food-and-fitness/food  Academy of Nutrition and Dietetics:  www.eatright.org/resources/health/diseases-and-conditions/diabetes  National Institute of Diabetes and Digestive and Kidney Diseases (NIH): www.niddk.nih.gov/health-information/diabetes/overview/diet-eating-physical-activity Summary  A healthy meal plan will help you control your blood glucose and maintain a healthy lifestyle.  Working with a diet and nutrition specialist (dietitian) can help you make a meal plan that is best for you.  Keep in mind that carbohydrates and alcohol have immediate effects on your blood glucose levels. It is important to count carbohydrates and to use alcohol carefully. This information is not intended to replace advice given to you by your health care provider. Make sure you discuss any questions you have with your health care provider. Document Released: 12/06/2004 Document Revised: 04/15/2016 Document Reviewed: 04/15/2016 Elsevier Interactive Patient Education  2018 Elsevier Inc.  

## 2017-08-07 NOTE — Progress Notes (Signed)
Subjective:    Patient ID: Charlotte Perez, female    DOB: 01-Feb-1961, 57 y.o.   MRN: [id number]  Chief Complaint  Patient presents with  . Diabetes    three month recheck   PT presents to the office today for chronic follow up. Pt is followed by Behavioral health for ADHD, GAD, and Depressionevery 3 months.Pt is followed by Pain Clinic every month for chronic back pain. All these are stable and doing "well".  Diabetes  She presents for her follow-up diabetic visit. She has type 2 diabetes mellitus. Her disease course has been stable. There are no hypoglycemic associated symptoms. Associated symptoms include foot paresthesias. Pertinent negatives for diabetes include no blurred vision and no visual change. There are no hypoglycemic complications. Symptoms are stable. Diabetic complications include peripheral neuropathy. Pertinent negatives for diabetic complications include no CVA, heart disease or nephropathy. Risk factors for coronary artery disease include dyslipidemia, diabetes mellitus and sedentary lifestyle. She is following a generally healthy diet. Her overall blood glucose range is 140-180 mg/dl. Eye exam is not current (04/2016).  Hyperlipidemia  This is a chronic problem. The current episode started more than 1 year ago. The problem is uncontrolled. Recent lipid tests were reviewed and are high. Current antihyperlipidemic treatment includes statins. The current treatment provides moderate improvement of lipids. Risk factors for coronary artery disease include dyslipidemia and diabetes mellitus.  Gastroesophageal Reflux  She reports no belching, no coughing or no heartburn. This is a chronic problem. The current episode started more than 1 year ago. The problem occurs occasionally. The problem has been waxing and waning. She has tried a PPI for the symptoms. The treatment provided moderate relief.  Constipation  This is a chronic problem. The current episode started more than 1  year ago. The problem has been waxing and waning since onset. Her stool frequency is 1 time per day. She has tried laxatives and diet changes for the symptoms. The treatment provided mild relief.  Migraine   This is a chronic problem. The current episode started more than 1 year ago. The problem has been resolved. Pertinent negatives include no blurred vision, coughing or visual change.  Depression         This is a chronic problem.  The current episode started more than 1 year ago.   The onset quality is gradual.   The problem occurs intermittently.  The problem has been waxing and waning since onset.  Associated symptoms include irritable, decreased interest and sad.  Associated symptoms include no helplessness.     Review of Systems  Eyes: Negative for blurred vision.  Respiratory: Negative for cough.   Gastrointestinal: Positive for constipation. Negative for heartburn.  Psychiatric/Behavioral: Positive for depression.  All other systems reviewed and are negative.      Objective:   Physical Exam  Constitutional: She is oriented to person, place, and time. She appears well-developed and well-nourished. She is irritable. No distress.  HENT:  Head: Normocephalic and atraumatic.  Right Ear: External ear normal.  Left Ear: External ear normal.  Nose: Nose normal.  Mouth/Throat: Oropharynx is clear and moist.  Eyes: Pupils are equal, round, and reactive to light.  Neck: Normal range of motion. Neck supple. No thyromegaly present.  Cardiovascular: Normal rate, regular rhythm, normal heart sounds and intact distal pulses.  No murmur heard. Pulmonary/Chest: Effort normal and breath sounds normal. No respiratory distress. She has no wheezes.  Abdominal: Soft. Bowel sounds are normal. She exhibits no distension. There is  no tenderness.  Musculoskeletal: Normal range of motion. She exhibits no edema or tenderness.  Neurological: She is alert and oriented to person, place, and time. She has  normal reflexes. No cranial nerve deficit.  Skin: Skin is warm and dry.  Psychiatric: She has a normal mood and affect. Her behavior is normal. Judgment and thought content normal.  Vitals reviewed.     BP 107/65   Pulse 75   Temp 97.7 F (36.5 C) (Oral)   Ht _0  (1.499 m)   Wt 144 lb (65.3 kg)   BMI 29.08 kg/m      Assessment & Plan:  Charlotte Perez comes in today with chief complaint of Diabetes (three month recheck) and Medical Management of Chronic Issues   Diagnosis and orders addressed:  1. Moderate episode of recurrent major depressive disorder (HCC) - CMP14+EGFR  2. Diabetic peripheral neuropathy (HCC) - CMP14+EGFR  3. Hyperlipidemia associated with type 2 diabetes mellitus (HCC) - CMP14+EGFR - Lipid panel  4. Type 2 diabetes mellitus with diabetic polyneuropathy, without long-term current use of insulin (HCC) - Bayer DCA Hb A1c Waived - CMP14+EGFR - Microalbumin / creatinine urine ratio  5. Gastroesophageal reflux disease without esophagitis - CMP14+EGFR  6. Migraine with aura and without status migrainosus, not intractable - CMP14+EGFR  7. ADD (attention deficit disorder) without hyperactivity - CMP14+EGFR  8. GAD (generalized anxiety disorder) - CMP14+EGFR  9. Iron deficiency anemia, unspecified iron deficiency anemia type - CMP14+EGFR - Vitamin B12  10. Chronic midline low back pain without sciatica - CMP14+EGFR  11. Constipation, unspecified constipation type - CMP14+EGFR  12. Overweight (BMI 25.0-29.9) - CMP14+EGFR   Labs pending Health Maintenance reviewed, pt schedule Eye exam and refuses Pneumonia vaccine today Diet and exercise encouraged  Follow up plan: 4 months    Evelina Dun, FNP

## 2017-08-08 LAB — CMP14+EGFR
ALT: 10 IU/L (ref 0–32)
AST: 13 IU/L (ref 0–40)
Albumin/Globulin Ratio: 1.7 (ref 1.2–2.2)
Albumin: 4.3 g/dL (ref 3.5–5.5)
Alkaline Phosphatase: 64 IU/L (ref 39–117)
BUN/Creatinine Ratio: 19 (ref 9–23)
BUN: 14 mg/dL (ref 6–24)
CALCIUM: 10.2 mg/dL (ref 8.7–10.2)
CHLORIDE: 101 mmol/L (ref 96–106)
CO2: 25 mmol/L (ref 20–29)
Creatinine, Ser: 0.73 mg/dL (ref 0.57–1.00)
GFR calc non Af Amer: 92 mL/min/{1.73_m2} (ref >59)
GFR, EST AFRICAN AMERICAN: 106 mL/min/{1.73_m2} (ref >59)
GLUCOSE: 108 mg/dL — AB (ref 65–99)
Globulin, Total: 2.5 g/dL (ref 1.5–4.5)
Potassium: 5.2 mmol/L (ref 3.5–5.2)
Sodium: 141 mmol/L (ref 134–144)
TOTAL PROTEIN: 6.8 g/dL (ref 6.0–8.5)

## 2017-08-08 LAB — LIPID PANEL
Chol/HDL Ratio: 2.1 ratio (ref 0.0–4.4)
Cholesterol, Total: 159 mg/dL (ref 100–199)
HDL: 76 mg/dL (ref >39)
LDL Calculated: 67 mg/dL (ref 0–99)
TRIGLYCERIDES: 80 mg/dL (ref 0–149)
VLDL Cholesterol Cal: 16 mg/dL (ref 5–40)

## 2017-08-08 LAB — MICROALBUMIN / CREATININE URINE RATIO
CREATININE, UR: 45.6 mg/dL
Microalb/Creat Ratio: 9.9 mg/g creat (ref 0.0–30.0)
Microalbumin, Urine: 4.5 ug/mL

## 2017-08-08 LAB — VITAMIN B12: VITAMIN B 12: 233 pg/mL (ref 232–1245)

## 2017-09-04 ENCOUNTER — Other Ambulatory Visit: Payer: Self-pay | Admitting: Family

## 2017-09-04 DIAGNOSIS — E1165 Type 2 diabetes mellitus with hyperglycemia: Secondary | ICD-10-CM

## 2017-09-04 DIAGNOSIS — M5441 Lumbago with sciatica, right side: Principal | ICD-10-CM

## 2017-09-04 DIAGNOSIS — M5442 Lumbago with sciatica, left side: Principal | ICD-10-CM

## 2017-09-04 DIAGNOSIS — E1142 Type 2 diabetes mellitus with diabetic polyneuropathy: Secondary | ICD-10-CM

## 2017-09-04 DIAGNOSIS — G8929 Other chronic pain: Secondary | ICD-10-CM

## 2017-09-10 ENCOUNTER — Ambulatory Visit (HOSPITAL_COMMUNITY): Payer: Medicaid Other | Admitting: Psychiatry

## 2017-09-15 ENCOUNTER — Other Ambulatory Visit: Payer: Self-pay | Admitting: Family

## 2017-09-15 DIAGNOSIS — Z1231 Encounter for screening mammogram for malignant neoplasm of breast: Secondary | ICD-10-CM

## 2017-09-22 ENCOUNTER — Ambulatory Visit (HOSPITAL_COMMUNITY): Payer: Medicaid Other | Admitting: Psychiatry

## 2017-09-22 ENCOUNTER — Ambulatory Visit: Payer: Medicaid Other | Admitting: Family Medicine

## 2017-09-22 ENCOUNTER — Encounter: Payer: Self-pay | Admitting: Family Medicine

## 2017-09-22 VITALS — BP 100/68 | HR 85 | Temp 97.6°F | Ht 59.0 in | Wt 139.8 lb

## 2017-09-22 DIAGNOSIS — S20461A Insect bite (nonvenomous) of right back wall of thorax, initial encounter: Secondary | ICD-10-CM

## 2017-09-22 DIAGNOSIS — W57XXXA Bitten or stung by nonvenomous insect and other nonvenomous arthropods, initial encounter: Secondary | ICD-10-CM | POA: Diagnosis not present

## 2017-09-22 MED ORDER — DOXYCYCLINE HYCLATE 100 MG PO TABS: 100.0000 mg | ORAL_TABLET | Freq: Two times a day (BID) | ORAL | 0 refills | Status: DC

## 2017-09-22 NOTE — Progress Notes (Signed)
Chief Complaint  Patient presents with  . Tick Removal    x 3 days- right upper back.     HPI  Patient presents today for evaluation of the tick bite that occurred several days ago.  It has been itching and red.  Her husband took it off.  It was removed in its entirety.  It seems to be getting worse.  She is vague about the size of the tick thinking it was probably the the size of a deer tick.  There is been no fever chills sweats or rash  PMH: Smoking status noted ROS: Per HPI  Objective: BP 100/68   Pulse 85   Temp 97.6 F (36.4 C) (Oral)   Ht 4\' 11"  (1.499 m)   Wt 139 lb 12.8 oz (63.4 kg)   BMI 28.24 kg/m  Gen: NAD, alert, cooperative with exam HEENT: NCAT, EOMI, PERRL CV: RRR, good S1/S2, no murmur Resp: CTABL, no wheezes, non-labored Abd: SNTND, BS present, no guarding or organomegaly Ext: No edema, warm Neuro: Alert and oriented, No gross deficits  Assessment and plan:  1. Tick bite, initial encounter     Meds ordered this encounter  Medications  . doxycycline (VIBRA-TABS) 100 MG tablet    Sig: Take 1 tablet (100 mg total) by mouth 2 (two) times daily. BID for 14 days    Dispense:  28 tablet    Refill:  0    Orders Placed This Encounter  Procedures  . Lyme Ab/Western Blot Reflex    Follow up as needed.  Claretta Fraise, MD

## 2017-09-24 LAB — LYME AB/WESTERN BLOT REFLEX
LYME DISEASE AB, QUANT, IGM: <0.8 index (ref 0.00–0.79)
Lyme IgG/IgM Ab: <0.91 {ISR} (ref 0.00–0.90)

## 2017-10-01 ENCOUNTER — Telehealth: Payer: Self-pay | Admitting: Family

## 2017-10-01 NOTE — Telephone Encounter (Signed)
Patient notified that Lymes disease lab were negative. Patient verbalized understanding

## 2017-10-06 ENCOUNTER — Encounter (HOSPITAL_COMMUNITY): Payer: Self-pay | Admitting: Psychiatry

## 2017-10-06 ENCOUNTER — Ambulatory Visit (HOSPITAL_COMMUNITY): Payer: Medicaid Other | Admitting: Psychiatry

## 2017-10-06 VITALS — BP 103/70 | HR 80 | Ht 59.0 in | Wt 137.8 lb

## 2017-10-06 DIAGNOSIS — F321 Major depressive disorder, single episode, moderate: Secondary | ICD-10-CM

## 2017-10-06 MED ORDER — ALPRAZOLAM 1 MG PO TABS: 1.0000 mg | ORAL_TABLET | Freq: Every evening | ORAL | 2 refills | Status: DC | PRN

## 2017-10-06 MED ORDER — VENLAFAXINE HCL ER 150 MG PO CP24: 300.0000 mg | ORAL_CAPSULE | Freq: Every day | ORAL | 2 refills | Status: DC
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
## 2017-10-15 ENCOUNTER — Ambulatory Visit (HOSPITAL_COMMUNITY): Payer: Self-pay

## 2017-10-16 ENCOUNTER — Ambulatory Visit (HOSPITAL_COMMUNITY)
Admission: RE | Admit: 2017-10-16 | Discharge: 2017-10-16 | Disposition: A | Discharge status: Home / Self Care | Payer: Medicaid Other | Source: Ambulatory Visit | Attending: Family | Admitting: Family

## 2017-10-16 DIAGNOSIS — Z1231 Encounter for screening mammogram for malignant neoplasm of breast: Secondary | ICD-10-CM | POA: Diagnosis not present

## 2017-10-19 IMAGING — MG 2D DIGITAL SCREENING BILATERAL MAMMOGRAM WITH CAD AND ADJUNCT TO
8 series · 9 of 24 positions shown · non-contrast
Comparison: Previous exam(s).

CLINICAL DATA: Screening.

EXAM:
2D DIGITAL SCREENING BILATERAL MAMMOGRAM WITH CAD AND ADJUNCT TOMO

[R CC]
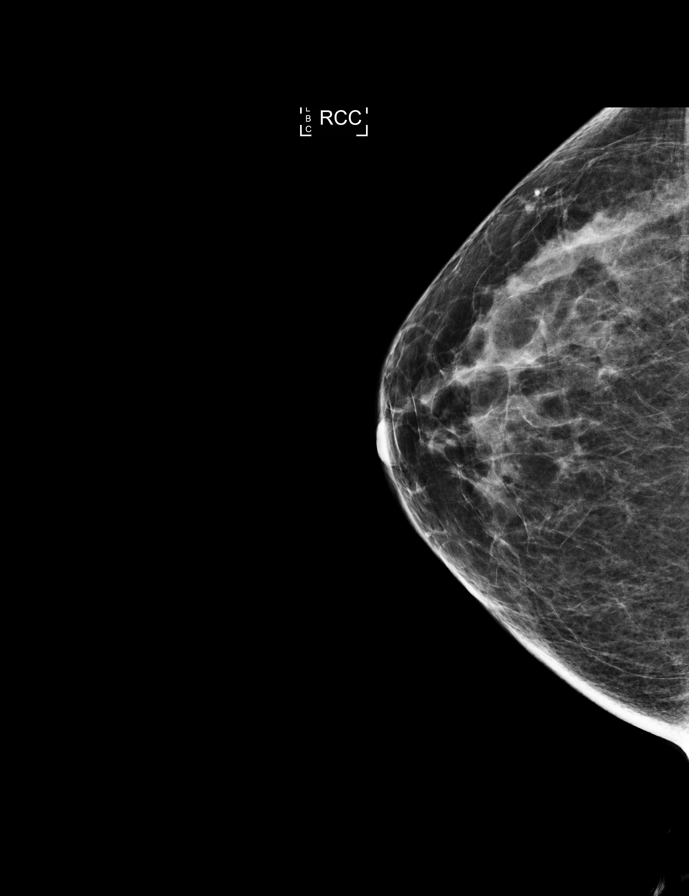

[R MLO]
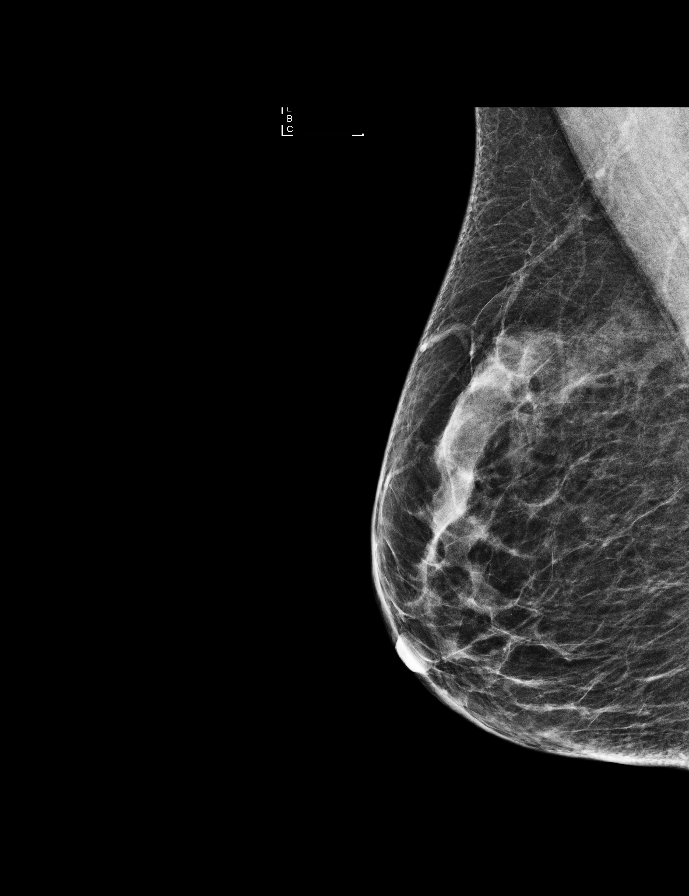

[L CC]
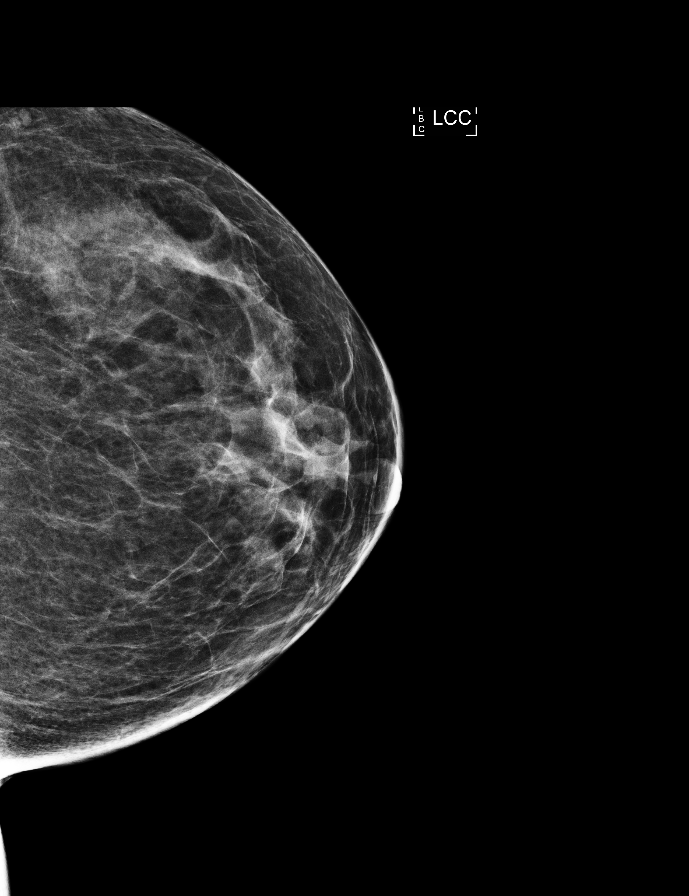

[L MLO]
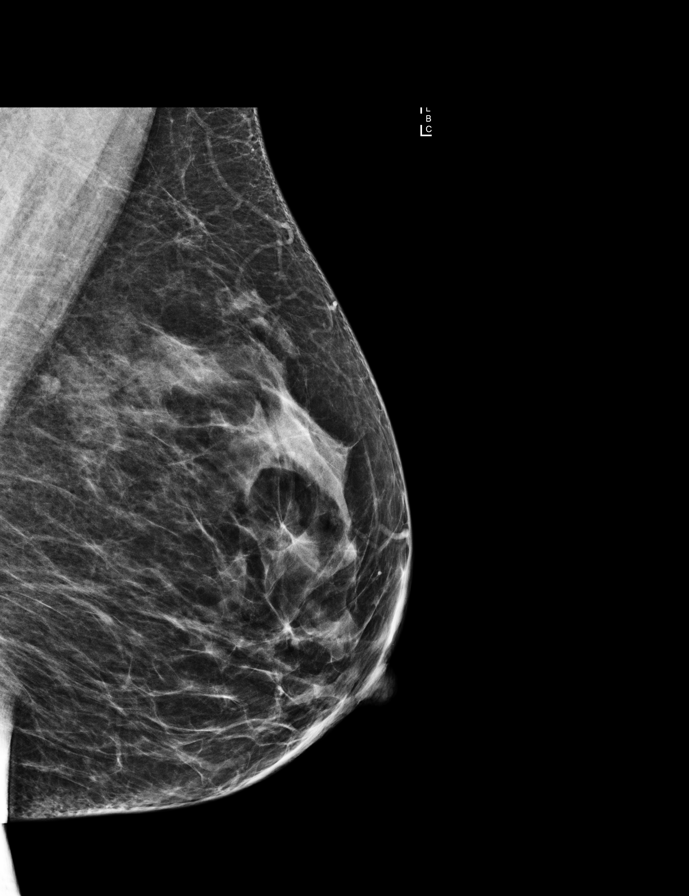

[R MLO tomo · 2 of 58 frames shown]
[frame 19/58]
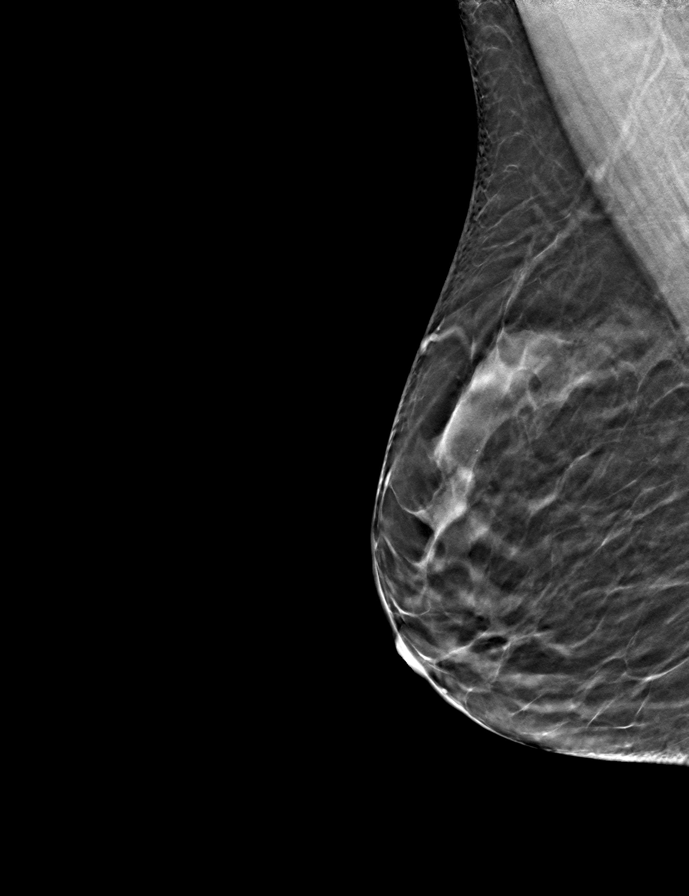
[frame 29/58]
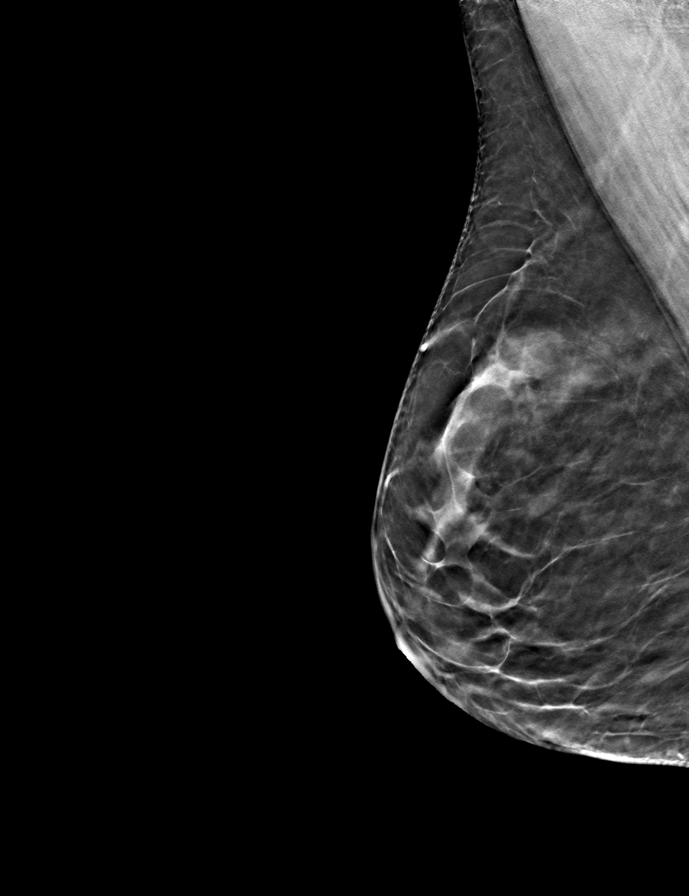

[L MLO tomo · tomo slice 30/59.0]
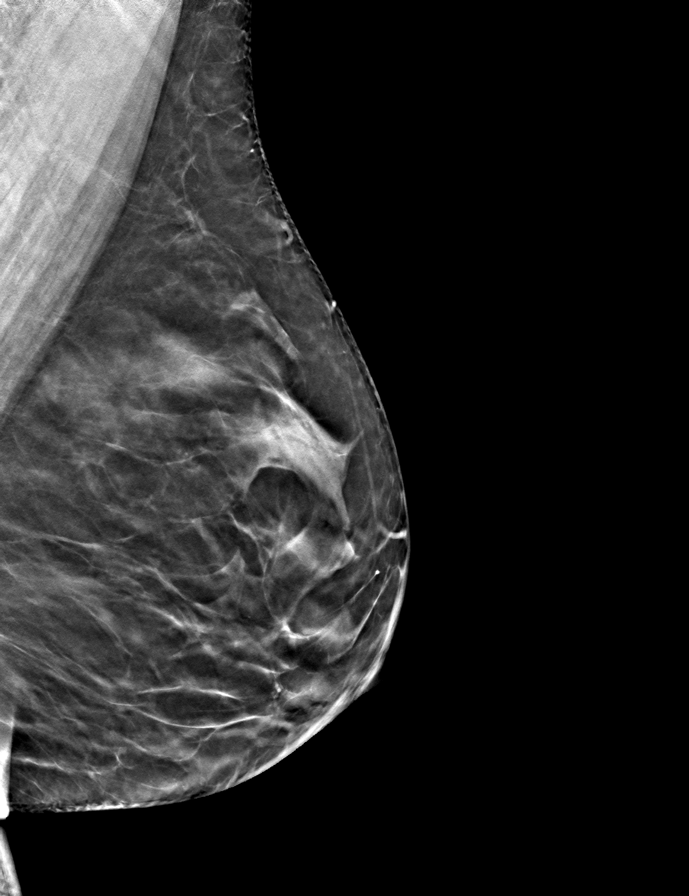

[R CC tomo · tomo slice 31/60.0]
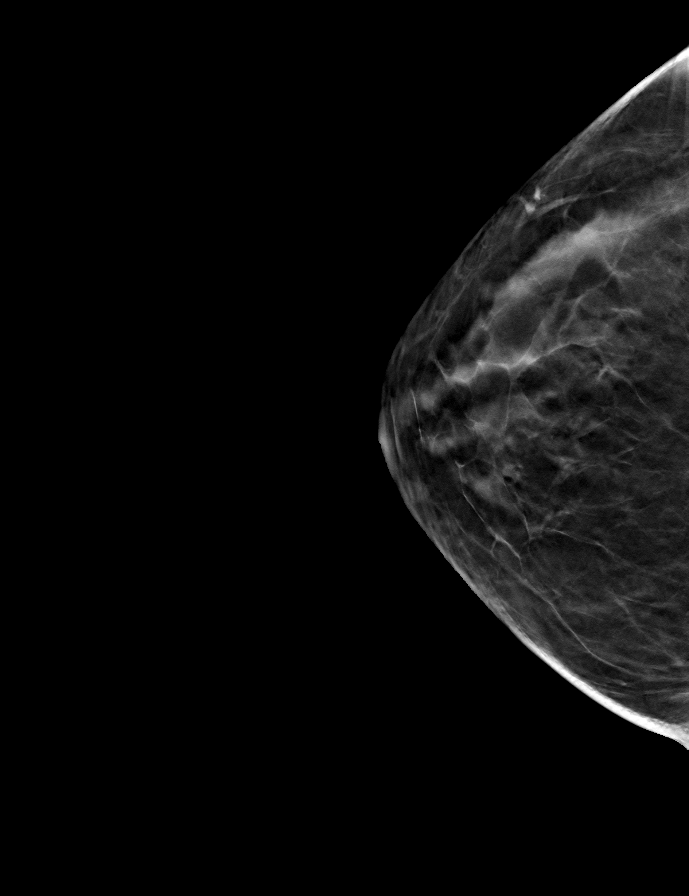

[L CC tomo · tomo slice 31/62.0]
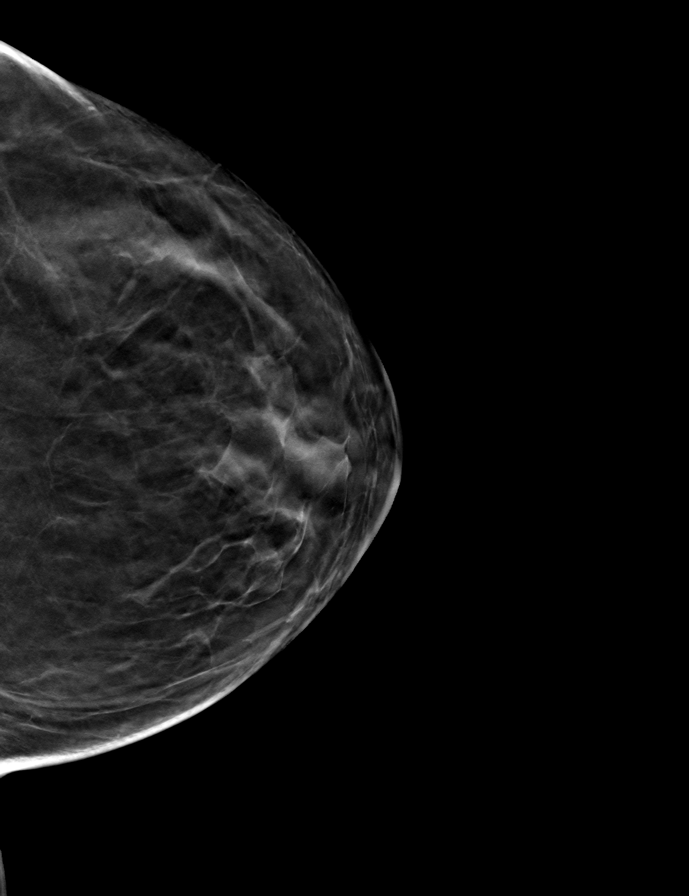

[9 of 24 positions shown; findings below may reference images not displayed]

ACR Breast Density Category b: There are scattered areas of
fibroglandular density.
FINDINGS: There are no findings suspicious for malignancy. Images were
processed with CAD.
IMPRESSION: No mammographic evidence of malignancy. A result letter of this
screening mammogram will be mailed directly to the patient.

RECOMMENDATION:
Screening mammogram in one year. (Code:97-6-RS4)

BI-RADS CATEGORY  1: Negative.

## 2017-10-20 DIAGNOSIS — Z0289 Encounter for other administrative examinations: Secondary | ICD-10-CM

## 2017-11-06 ENCOUNTER — Other Ambulatory Visit: Payer: Self-pay | Admitting: Family

## 2017-11-07 ENCOUNTER — Other Ambulatory Visit: Payer: Self-pay | Admitting: Family

## 2017-12-01 ENCOUNTER — Other Ambulatory Visit: Payer: Self-pay | Admitting: Family

## 2017-12-01 DIAGNOSIS — K5900 Constipation, unspecified: Secondary | ICD-10-CM

## 2017-12-09 ENCOUNTER — Ambulatory Visit: Payer: Medicaid Other | Admitting: Family

## 2017-12-09 ENCOUNTER — Encounter: Payer: Self-pay | Admitting: Family

## 2017-12-09 VITALS — BP 113/70 | HR 83 | Temp 98.0°F | Ht 59.0 in | Wt 140.8 lb

## 2017-12-09 DIAGNOSIS — M545 Low back pain: Secondary | ICD-10-CM | POA: Diagnosis not present

## 2017-12-09 DIAGNOSIS — D509 Iron deficiency anemia, unspecified: Secondary | ICD-10-CM

## 2017-12-09 DIAGNOSIS — E663 Overweight: Secondary | ICD-10-CM

## 2017-12-09 DIAGNOSIS — F331 Major depressive disorder, recurrent, moderate: Secondary | ICD-10-CM | POA: Diagnosis not present

## 2017-12-09 DIAGNOSIS — F988 Other specified behavioral and emotional disorders with onset usually occurring in childhood and adolescence: Secondary | ICD-10-CM | POA: Diagnosis not present

## 2017-12-09 DIAGNOSIS — E1142 Type 2 diabetes mellitus with diabetic polyneuropathy: Secondary | ICD-10-CM | POA: Diagnosis not present

## 2017-12-09 DIAGNOSIS — E785 Hyperlipidemia, unspecified: Secondary | ICD-10-CM

## 2017-12-09 DIAGNOSIS — K219 Gastro-esophageal reflux disease without esophagitis: Secondary | ICD-10-CM

## 2017-12-09 DIAGNOSIS — E1169 Type 2 diabetes mellitus with other specified complication: Secondary | ICD-10-CM

## 2017-12-09 DIAGNOSIS — F411 Generalized anxiety disorder: Secondary | ICD-10-CM

## 2017-12-09 DIAGNOSIS — G8929 Other chronic pain: Secondary | ICD-10-CM

## 2017-12-09 LAB — CBC WITH DIFFERENTIAL/PLATELET
BASOS ABS: 0.1 10*3/uL (ref 0.0–0.2)
Basos: 1 %
EOS (ABSOLUTE): 0.2 10*3/uL (ref 0.0–0.4)
EOS: 4 %
HEMATOCRIT: 37.8 % (ref 34.0–46.6)
Hemoglobin: 12.3 g/dL (ref 11.1–15.9)
IMMATURE GRANS (ABS): 0 10*3/uL (ref 0.0–0.1)
IMMATURE GRANULOCYTES: 0 %
LYMPHS: 30 %
Lymphocytes Absolute: 1.5 10*3/uL (ref 0.7–3.1)
MCH: 29.2 pg (ref 26.6–33.0)
MCHC: 32.5 g/dL (ref 31.5–35.7)
MCV: 90 fL (ref 79–97)
MONOCYTES: 10 %
Monocytes Absolute: 0.5 10*3/uL (ref 0.1–0.9)
Neutrophils Absolute: 2.7 10*3/uL (ref 1.4–7.0)
Neutrophils: 55 %
Platelets: 243 10*3/uL (ref 150–450)
RBC: 4.21 x10E6/uL (ref 3.77–5.28)
RDW: 13 % (ref 12.3–15.4)
WBC: 5 10*3/uL (ref 3.4–10.8)

## 2017-12-09 LAB — CMP14+EGFR
ALT: 9 IU/L (ref 0–32)
AST: 15 IU/L (ref 0–40)
Albumin/Globulin Ratio: 1.7 (ref 1.2–2.2)
Albumin: 3.9 g/dL (ref 3.5–5.5)
Alkaline Phosphatase: 61 IU/L (ref 39–117)
BUN/Creatinine Ratio: 20 (ref 9–23)
BUN: 13 mg/dL (ref 6–24)
Bilirubin Total: <0.2 mg/dL (ref 0.0–1.2)
CHLORIDE: 101 mmol/L (ref 96–106)
CO2: 25 mmol/L (ref 20–29)
CREATININE: 0.66 mg/dL (ref 0.57–1.00)
Calcium: 9.3 mg/dL (ref 8.7–10.2)
GFR calc Af Amer: 113 mL/min/{1.73_m2} (ref >59)
GFR calc non Af Amer: 98 mL/min/{1.73_m2} (ref >59)
GLUCOSE: 101 mg/dL — AB (ref 65–99)
Globulin, Total: 2.3 g/dL (ref 1.5–4.5)
Potassium: 5 mmol/L (ref 3.5–5.2)
Sodium: 141 mmol/L (ref 134–144)
Total Protein: 6.2 g/dL (ref 6.0–8.5)

## 2017-12-09 LAB — BAYER DCA HB A1C WAIVED: HB A1C: 6.7 % (ref <7.0)

## 2017-12-09 NOTE — Progress Notes (Signed)
Subjective:    Patient ID: Charlotte Perez, female    DOB: [birth year], 57 y.o.   MRN: [id number]  Chief Complaint  Patient presents with  . Diabetes    four month recheck   PT presents to the office today for chronic follow up. Pt is followed by Behavioral health for ADHD, GAD, and Depressionevery 3 months.Pt is followed by Pain Clinic every month for chronic back pain. All these are stable and doing "well".  Diabetes  She presents for her follow-up diabetic visit. She has type 2 diabetes mellitus. Her disease course has been stable. Pertinent negatives for hypoglycemia include no confusion. Associated symptoms include foot paresthesias. Pertinent negatives for diabetes include no blurred vision and no visual change. There are no hypoglycemic complications. Symptoms are stable. Diabetic complications include peripheral neuropathy. Pertinent negatives for diabetic complications include no CVA, heart disease or nephropathy. Risk factors for coronary artery disease include dyslipidemia, obesity, hypertension, sedentary lifestyle and post-menopausal. She is following a generally healthy diet. Her overall blood glucose range is 130-140 mg/dl. Eye exam is not current (04/2016).  Gastroesophageal Reflux  She reports no belching or no heartburn. This is a chronic problem. The current episode started more than 1 year ago. The problem occurs occasionally. The problem has been resolved. She has tried a PPI for the symptoms. The treatment provided moderate relief.  Hyperlipidemia  This is a chronic problem. The current episode started more than 1 year ago. The problem is controlled. Recent lipid tests were reviewed and are normal. Current antihyperlipidemic treatment includes statins. The current treatment provides moderate improvement of lipids. Risk factors for coronary artery disease include dyslipidemia, hypertension, a sedentary lifestyle and post-menopausal.  Anemia  Presents for follow-up visit.  There has been no confusion.  Diabetic Neuropathy PT states she has bilateral burning aching pain of 7 out 10.  Cervical Radiculopathy Pt states this started about two months and comes and goes. She states the pain starts in her upper back and then radiates down her left arm. She states her left arm becomes tingling and numbness that comes and goes.    Review of Systems  Eyes: Negative for blurred vision.  Gastrointestinal: Negative for heartburn.  Psychiatric/Behavioral: Negative for confusion.  All other systems reviewed and are negative.      Objective:   Physical Exam  Constitutional: She is oriented to person, place, and time. She appears well-developed and well-nourished. No distress.  HENT:  Head: Normocephalic and atraumatic.  Right Ear: External ear normal.  Left Ear: External ear normal.  Mouth/Throat: Oropharynx is clear and moist.  Eyes: Pupils are equal, round, and reactive to light.  Neck: Normal range of motion. Neck supple. No thyromegaly present.  Cardiovascular: Normal rate, regular rhythm, normal heart sounds and intact distal pulses.  No murmur heard. Pulmonary/Chest: Effort normal and breath sounds normal. No respiratory distress. She has no wheezes.  Abdominal: Soft. Bowel sounds are normal. She exhibits no distension. There is no tenderness.  Musculoskeletal: Normal range of motion. She exhibits no edema or tenderness.  Neurological: She is alert and oriented to person, place, and time. She has normal reflexes. No cranial nerve deficit.  Skin: Skin is warm and dry.  Psychiatric: She has a normal mood and affect. Her behavior is normal. Judgment and thought content normal.  Vitals reviewed.     BP 113/70   Pulse 83   Temp 98 F (36.7 C) (Oral)   Ht '4\' 11"'$  (1.499 m)   Wt 140  lb 12.8 oz (63.9 kg)   BMI 28.44 kg/m      Assessment & Plan:  Charlotte Perez comes in today with chief complaint of Diabetes (four month recheck)   Diagnosis and orders  addressed:  1. Type 2 diabetes mellitus with diabetic polyneuropathy, without long-term current use of insulin (HCC) - CMP14+EGFR - CBC with Differential/Platelet - Bayer DCA Hb A1c Waived  2. Diabetic peripheral neuropathy (HCC) - CMP14+EGFR - CBC with Differential/Platelet  3. Moderate episode of recurrent major depressive disorder (HCC) - CMP14+EGFR - CBC with Differential/Platelet  4. ADD (attention deficit disorder) without hyperactivity - CMP14+EGFR - CBC with Differential/Platelet  5. Chronic midline low back pain without sciatica - CMP14+EGFR - CBC with Differential/Platelet  6. GAD (generalized anxiety disorder) - CMP14+EGFR - CBC with Differential/Platelet  7. Gastroesophageal reflux disease without esophagitis - CMP14+EGFR - CBC with Differential/Platelet  8. Hyperlipidemia associated with type 2 diabetes mellitus (HCC) - CMP14+EGFR - CBC with Differential/Platelet  9. Overweight (BMI 25.0-29.9) - CMP14+EGFR - CBC with Differential/Platelet  10. Iron deficiency anemia, unspecified iron deficiency anemia type - CMP14+EGFR - CBC with Differential/Platelet   Labs pending Health Maintenance reviewed Diet and exercise encouraged  Follow up plan: 6 months    Evelina Dun, FNP

## 2017-12-09 NOTE — Patient Instructions (Signed)
Cervical Radiculopathy  Cervical radiculopathy means that a nerve in the neck is pinched or bruised. This can cause pain or loss of feeling (numbness) that runs from your neck to your arm and fingers.  Follow these instructions at home:  Managing pain  ? Take over-the-counter and prescription medicines only as told by your doctor.  ? If directed, put ice on the injured or painful area.  ? Put ice in a plastic bag.  ? Place a towel between your skin and the bag.  ? Leave the ice on for 20 minutes, 2?3 times per day.  ? If ice does not help, you can try using heat. Take a warm shower or warm bath, or use a heat pack as told by your doctor.  ? You may try a gentle neck and shoulder massage.  Activity  ? Rest as needed. Follow instructions from your doctor about any activities to avoid.  ? Do exercises as told by your doctor or physical therapist.  General instructions  ? If you were given a soft collar, wear it as told by your doctor.  ? Use a flat pillow when you sleep.  ? Keep all follow-up visits as told by your doctor. This is important.  Contact a doctor if:  ? Your condition does not improve with treatment.  Get help right away if:  ? Your pain gets worse and is not controlled with medicine.  ? You lose feeling or feel weak in your hand, arm, face, or leg.  ? You have a fever.  ? You have a stiff neck.  ? You cannot control when you poop or pee (have incontinence).  ? You have trouble with walking, balance, or talking.  This information is not intended to replace advice given to you by your health care provider. Make sure you discuss any questions you have with your health care provider.  Document Released: 02/28/2011 Document Revised: 08/17/2015 Document Reviewed: 05/05/2014  Elsevier Interactive Patient Education ? 2018 [employer]

## 2017-12-10 ENCOUNTER — Encounter (HOSPITAL_COMMUNITY): Payer: Self-pay | Admitting: Psychiatry

## 2017-12-10 ENCOUNTER — Ambulatory Visit (HOSPITAL_COMMUNITY): Payer: Medicaid Other | Admitting: Psychiatry

## 2017-12-10 VITALS — BP 105/71 | HR 84 | Ht 59.0 in | Wt 140.0 lb

## 2017-12-10 DIAGNOSIS — F321 Major depressive disorder, single episode, moderate: Secondary | ICD-10-CM | POA: Diagnosis not present

## 2017-12-10 MED ORDER — VENLAFAXINE HCL ER 150 MG PO CP24: 300.0000 mg | ORAL_CAPSULE | Freq: Every day | ORAL | 2 refills | Status: DC

## 2017-12-10 MED ORDER — ALPRAZOLAM 1 MG PO TABS: 1.0000 mg | ORAL_TABLET | Freq: Every evening | ORAL | 2 refills | Status: DC | PRN
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
## 2017-12-20 ENCOUNTER — Other Ambulatory Visit: Payer: Self-pay | Admitting: Family

## 2017-12-20 DIAGNOSIS — E1165 Type 2 diabetes mellitus with hyperglycemia: Secondary | ICD-10-CM

## 2018-01-01 ENCOUNTER — Other Ambulatory Visit: Payer: Self-pay | Admitting: Family

## 2018-01-01 DIAGNOSIS — M5442 Lumbago with sciatica, left side: Principal | ICD-10-CM

## 2018-01-01 DIAGNOSIS — M5441 Lumbago with sciatica, right side: Principal | ICD-10-CM

## 2018-01-01 DIAGNOSIS — G8929 Other chronic pain: Secondary | ICD-10-CM

## 2018-01-12 ENCOUNTER — Ambulatory Visit (INDEPENDENT_AMBULATORY_CARE_PROVIDER_SITE_OTHER): Payer: Medicaid Other

## 2018-01-12 DIAGNOSIS — Z23 Encounter for immunization: Secondary | ICD-10-CM | POA: Diagnosis not present

## 2018-02-02 ENCOUNTER — Other Ambulatory Visit: Payer: Self-pay | Admitting: Family

## 2018-02-16 IMAGING — DX DG LUMBAR SPINE 2-3V
2 series · 2 of 2 positions shown · non-contrast
Comparison: Lumbar spine films of 06/05/2016

CLINICAL DATA: Recent fall, low back

EXAM:
LUMBAR SPINE - 2-3 VIEW

[l-spine ap]
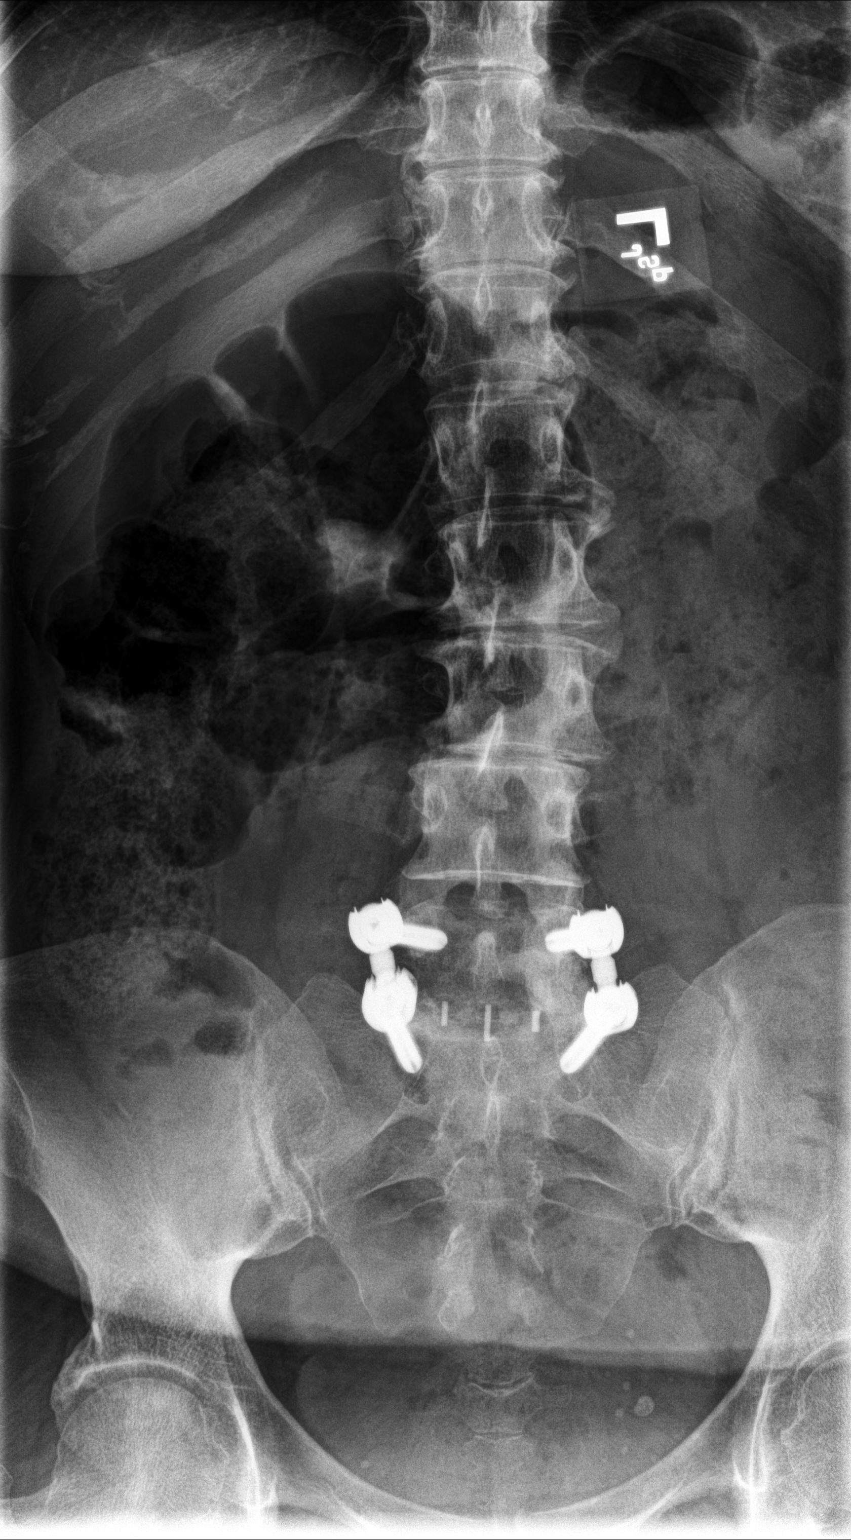

[l-spine lat]
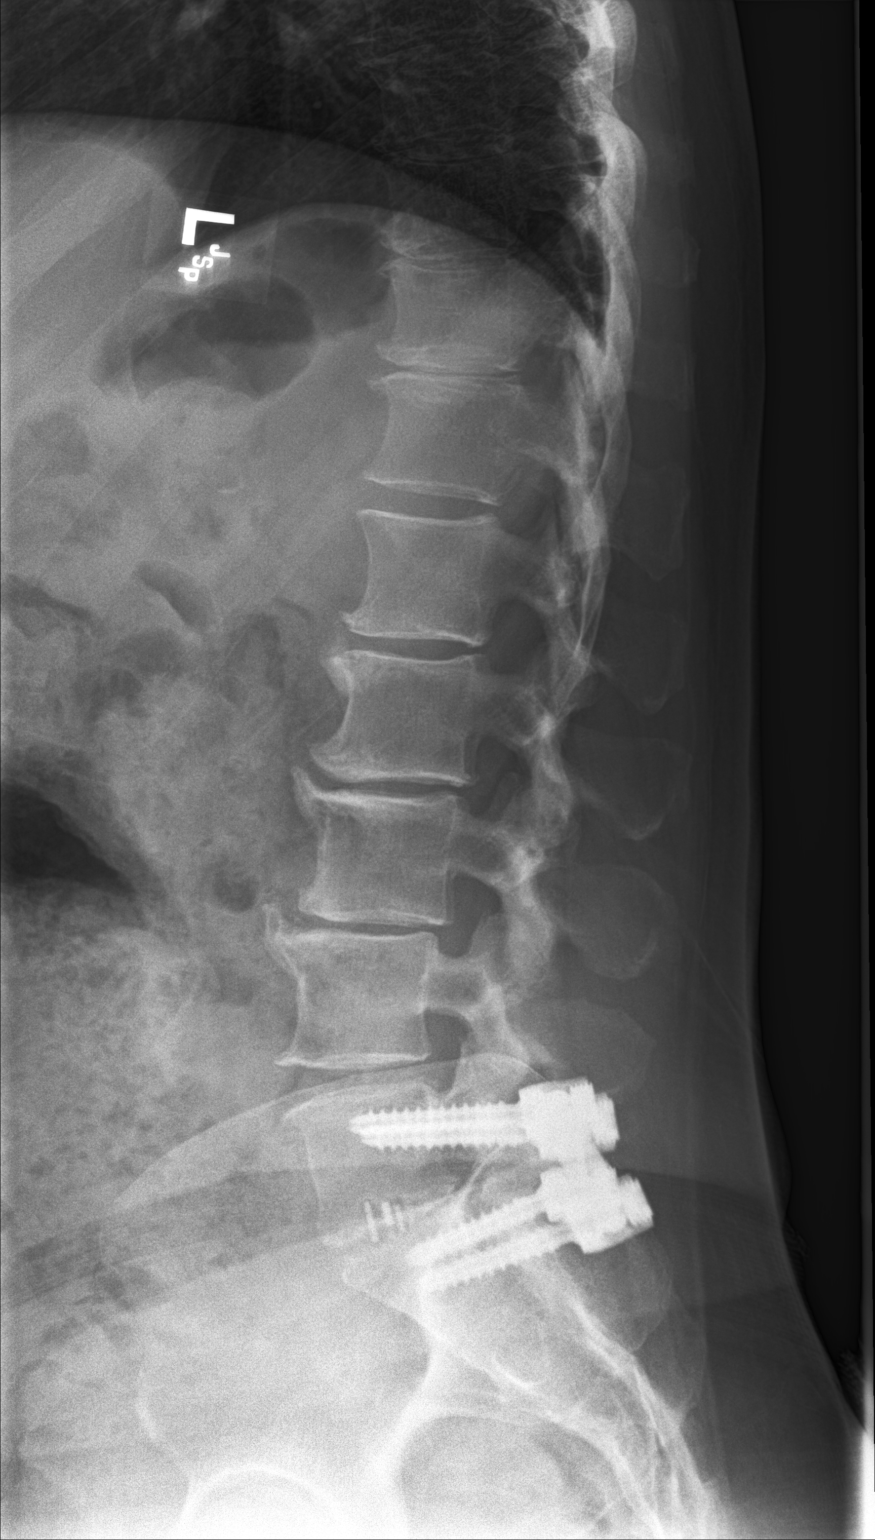

[2 of 2 positions shown; findings below may reference images not displayed]

FINDINGS: The lumbar vertebrae are unchanged in alignment. Hardware for
posterior fusion at L5-S1 is unchanged and the interbody fusion plug
at that level level is unchanged. There is diffuse degenerative disc
disease throughout the lumbar spine. No compression deformity is
seen. The SI joints appear corticated.
IMPRESSION: 1. Stable alignment of the lumbar vertebrae with posterior fusion at
L5-S1.
2. Diffuse degenerative disc disease.  No acute compression.

## 2018-02-17 ENCOUNTER — Other Ambulatory Visit: Payer: Self-pay | Admitting: Family

## 2018-02-17 NOTE — Telephone Encounter (Signed)
OV 12/09/17 rtc 6 mos

## 2018-03-06 ENCOUNTER — Other Ambulatory Visit: Payer: Self-pay | Admitting: Family

## 2018-03-06 ENCOUNTER — Other Ambulatory Visit (HOSPITAL_COMMUNITY): Payer: Self-pay | Admitting: Psychiatry

## 2018-03-06 DIAGNOSIS — E1165 Type 2 diabetes mellitus with hyperglycemia: Secondary | ICD-10-CM

## 2018-03-11 ENCOUNTER — Ambulatory Visit (HOSPITAL_COMMUNITY): Payer: Medicaid Other | Admitting: Psychiatry

## 2018-03-11 ENCOUNTER — Encounter (HOSPITAL_COMMUNITY): Payer: Self-pay | Admitting: Psychiatry

## 2018-03-11 VITALS — BP 120/73 | HR 91 | Ht 59.0 in | Wt 139.8 lb

## 2018-03-11 DIAGNOSIS — F321 Major depressive disorder, single episode, moderate: Secondary | ICD-10-CM

## 2018-03-11 MED ORDER — VENLAFAXINE HCL ER 150 MG PO CP24: 300.0000 mg | ORAL_CAPSULE | Freq: Every day | ORAL | 2 refills | Status: DC

## 2018-03-11 MED ORDER — ALPRAZOLAM 1 MG PO TABS: ORAL_TABLET | ORAL | 0 refills | Status: DC
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
## 2018-03-20 ENCOUNTER — Other Ambulatory Visit: Payer: Self-pay | Admitting: Family

## 2018-03-20 DIAGNOSIS — E1165 Type 2 diabetes mellitus with hyperglycemia: Secondary | ICD-10-CM

## 2018-04-01 ENCOUNTER — Ambulatory Visit: Payer: Medicaid Other | Admitting: Family Medicine

## 2018-04-01 ENCOUNTER — Encounter: Payer: Self-pay | Admitting: Family Medicine

## 2018-04-01 VITALS — BP 105/67 | HR 89 | Temp 98.1°F | Ht 59.0 in | Wt 142.2 lb

## 2018-04-01 DIAGNOSIS — J029 Acute pharyngitis, unspecified: Secondary | ICD-10-CM | POA: Diagnosis not present

## 2018-04-01 LAB — VERITOR FLU A/B WAIVED
Influenza A: NEGATIVE
Influenza B: NEGATIVE

## 2018-04-01 MED ORDER — ALBUTEROL SULFATE (2.5 MG/3ML) 0.083% IN NEBU: 2.5000 mg | INHALATION_SOLUTION | Freq: Four times a day (QID) | RESPIRATORY_TRACT | 3 refills | Status: DC | PRN

## 2018-04-01 MED ORDER — AZITHROMYCIN 250 MG PO TABS: ORAL_TABLET | ORAL | 0 refills | Status: DC

## 2018-04-01 NOTE — Progress Notes (Signed)
Subjective  Charlotte Perez is a 58 year old female coming in on 04/01/17 for cough, congestion, and body aches x 3 days.  HPI  Patient states she has had a sore throat, rhinorrhea and chills for the last 3 days. She has fatigue and generalized body aches, and feels chest pressure while taking deep breaths. Denies fever. Cough is sometimes productive with white phlegm. She has a nebulizer at home but has run out of albuterol solution. She has been taking allergy medication and using throat lozenges without relief. Patient's husband became ill with the same symptoms a day following her onset.  ROS Constitutional: positive chills, fatigue, body aches; denies fever HEENT: positive headache, sinus pressure, cough, sore throat, rhinorrhea Pulmonary: chest pressure during inspiration and expiration, shortness of breath  Objective  Vitals: BP-105/67; P-89; T-98.1; O2-96%; Wt-142.2 General: patient appears ill and is wearing a mask HEENT: normal ear canals and tympanic membranes bilaterally; throat - erythema, no exudates Neck: Anterior cervical lymph nodes swollen and tender to palpation bilaterally Cardiac: regular rate and rhythm Pulmonary: clear to auscultation bilaterally, patient unable to take deep breaths Musculoskeletal low rib pain from her coughing worse with coughing or deep inspiration also tender to palpation   Rapid flu test: negative  Assessment  Acute Pharyngitis  Plan  - Azithromycin 250mg  - take 2 po the first day then one each for the next 4 days - Albuterol nebulizer solution  Problem List Items Addressed This Visit    None    Visit Diagnoses    Acute pharyngitis, unspecified etiology    -  Primary   Relevant Medications   albuterol (PROVENTIL) (2.5 MG/3ML) 0.083% nebulizer solution   azithromycin (ZITHROMAX) 250 MG tablet   Other Relevant Orders   [facility]itor Flu A/B Waived      Patient seen and examined with [employer].  Agree with assessment and plan  above.  We will treat with azithromycin albuterol and also recommended for patient to take Mucinex. Caryl Pina, MD [facility] 04/01/2018, 1:52 PM

## 2018-04-16 ENCOUNTER — Other Ambulatory Visit: Payer: Self-pay | Admitting: Family

## 2018-04-16 ENCOUNTER — Other Ambulatory Visit (HOSPITAL_COMMUNITY): Payer: Self-pay | Admitting: Psychiatry

## 2018-04-16 DIAGNOSIS — E1142 Type 2 diabetes mellitus with diabetic polyneuropathy: Secondary | ICD-10-CM

## 2018-04-30 ENCOUNTER — Telehealth: Payer: Self-pay | Admitting: Family

## 2018-04-30 ENCOUNTER — Encounter (HOSPITAL_COMMUNITY): Payer: Self-pay | Admitting: Emergency Medicine

## 2018-04-30 ENCOUNTER — Emergency Department (HOSPITAL_COMMUNITY): Payer: Medicaid Other

## 2018-04-30 ENCOUNTER — Other Ambulatory Visit: Payer: Self-pay

## 2018-04-30 ENCOUNTER — Emergency Department (HOSPITAL_COMMUNITY)
Admission: EM | Admit: 2018-04-30 | Discharge: 2018-04-30 | Disposition: A | Discharge status: Home / Self Care | Payer: Medicaid Other | Attending: Emergency Medicine | Admitting: Emergency Medicine

## 2018-04-30 DIAGNOSIS — Z7722 Contact with and (suspected) exposure to environmental tobacco smoke (acute) (chronic): Secondary | ICD-10-CM | POA: Diagnosis not present

## 2018-04-30 DIAGNOSIS — E119 Type 2 diabetes mellitus without complications: Secondary | ICD-10-CM | POA: Insufficient documentation

## 2018-04-30 DIAGNOSIS — R079 Chest pain, unspecified: Secondary | ICD-10-CM | POA: Diagnosis present

## 2018-04-30 DIAGNOSIS — J449 Chronic obstructive pulmonary disease, unspecified: Secondary | ICD-10-CM | POA: Diagnosis not present

## 2018-04-30 DIAGNOSIS — Z79899 Other long term (current) drug therapy: Secondary | ICD-10-CM | POA: Diagnosis not present

## 2018-04-30 DIAGNOSIS — I10 Essential (primary) hypertension: Secondary | ICD-10-CM | POA: Diagnosis not present

## 2018-04-30 DIAGNOSIS — Z7984 Long term (current) use of oral hypoglycemic drugs: Secondary | ICD-10-CM | POA: Insufficient documentation

## 2018-04-30 DIAGNOSIS — M5412 Radiculopathy, cervical region: Secondary | ICD-10-CM | POA: Insufficient documentation

## 2018-04-30 LAB — CBC WITH DIFFERENTIAL/PLATELET
Abs Immature Granulocytes: 0 10*3/uL (ref 0.00–0.07)
BASOS ABS: 0 10*3/uL (ref 0.0–0.1)
Basophils Relative: 1 %
Eosinophils Absolute: 0.2 10*3/uL (ref 0.0–0.5)
Eosinophils Relative: 4 %
HCT: 41.3 % (ref 36.0–46.0)
Hemoglobin: 12.8 g/dL (ref 12.0–15.0)
Immature Granulocytes: 0 %
Lymphocytes Relative: 23 %
Lymphs Abs: 1 10*3/uL (ref 0.7–4.0)
MCH: 28.8 pg (ref 26.0–34.0)
MCHC: 31 g/dL (ref 30.0–36.0)
MCV: 92.8 fL (ref 80.0–100.0)
Monocytes Absolute: 0.6 10*3/uL (ref 0.1–1.0)
Monocytes Relative: 14 %
Neutro Abs: 2.5 10*3/uL (ref 1.7–7.7)
Neutrophils Relative %: 58 %
PLATELETS: 174 10*3/uL (ref 150–400)
RBC: 4.45 MIL/uL (ref 3.87–5.11)
RDW: 13.2 % (ref 11.5–15.5)
WBC: 4.2 10*3/uL (ref 4.0–10.5)
nRBC: 0 % (ref 0.0–0.2)

## 2018-04-30 LAB — TROPONIN I: Troponin I: <0.03 ng/mL (ref <0.03)

## 2018-04-30 LAB — COMPREHENSIVE METABOLIC PANEL
ALBUMIN: 3.9 g/dL (ref 3.5–5.0)
ALT: 17 U/L (ref 0–44)
AST: 21 U/L (ref 15–41)
Alkaline Phosphatase: 57 U/L (ref 38–126)
Anion gap: 10 (ref 5–15)
BUN: 14 mg/dL (ref 6–20)
CO2: 25 mmol/L (ref 22–32)
Calcium: 9.1 mg/dL (ref 8.9–10.3)
Chloride: 103 mmol/L (ref 98–111)
Creatinine, Ser: 0.58 mg/dL (ref 0.44–1.00)
GFR calc Af Amer: >60 mL/min (ref >60)
GFR calc non Af Amer: >60 mL/min (ref >60)
GLUCOSE: 134 mg/dL — AB (ref 70–99)
Potassium: 4.3 mmol/L (ref 3.5–5.1)
Sodium: 138 mmol/L (ref 135–145)
Total Bilirubin: 0.3 mg/dL (ref 0.3–1.2)
Total Protein: 7.1 g/dL (ref 6.5–8.1)

## 2018-04-30 MED ORDER — PREDNISONE 20 MG PO TABS
40.0000 mg | ORAL_TABLET | Freq: Once | ORAL | Status: AC
  Administered 2018-04-30: 40 mg via ORAL
  Filled 2018-04-30: qty 2

## 2018-04-30 MED ORDER — MORPHINE SULFATE (PF) 4 MG/ML IV SOLN
4.0000 mg | Freq: Once | INTRAVENOUS | Status: AC
  Administered 2018-04-30: 4 mg via INTRAVENOUS
  Filled 2018-04-30: qty 1

## 2018-04-30 MED ORDER — PREDNISONE 20 MG PO TABS: 40.0000 mg | ORAL_TABLET | Freq: Every day | ORAL | 0 refills | Status: DC

## 2018-04-30 NOTE — ED Triage Notes (Signed)
Onset 3 days ago chest pain, right arm tingling. Dizzy today.

## 2018-04-30 NOTE — Telephone Encounter (Signed)
error 

## 2018-04-30 NOTE — ED Provider Notes (Signed)
[facility] EMERGENCY DEPARTMENT Provider Note   CSN: [id number] Arrival date & time: 04/30/18  1400     History   Chief Complaint Chief Complaint  Patient presents with  . Chest Pain    HPI Charlotte Perez is a 58 y.o. female.  HPI  58 year old female, presents with a complaint of chest discomfort but also with a complaint of a burning and stinging sensation in her right arm which started 3 days ago.  She reports that once the stinging and burning sensation goes to her hand that goes up into her arm through her right chest into the left chest and then it resides in the midsternal area.  She has no associated shortness of breath or swelling of her legs.  She has no history of exertional symptoms.  It has become worsened over the last 3 days but not necessarily positional or associated with breathing or coughing or fevers or any other symptoms.  She does have a history of prior cervical neck surgery in the past and has chronic back pain with this.  She has not had any weakness of her arms and no symptoms in her legs.  No nausea or vomiting or diarrhea, no fevers or chills or cough or shortness of breath  Past Medical History:  Diagnosis Date  . Arthritis   . Asthma   . Bipolar disorder ([place])   . Chronic back pain   . Chronic neck pain   . COPD (chronic obstructive pulmonary disease) ([place])   . DDD (degenerative disc disease), cervical   . DDD (degenerative disc disease), lumbar   . Depression   . Diabetes mellitus without complication ([place])   . Diabetes mellitus, type II ([place])   . Gallbladder sludge   . GERD (gastroesophageal reflux disease)   . Hyperlipidemia   . Hypertension   . Lumbar radiculopathy   . Mole (skin)    pt. thinks its a tick   . Neuropathy of foot    Feet   . Renal cyst   . Shingles   . Skin lesions, generalized    1.2 CM FLAT FAWN COLOR AT THE T10 AREA JUST RIGHT OF SPINE     Patient Active Problem List   Diagnosis Date Noted  . Iron deficiency  anemia 09/05/2016  . Constipation 09/05/2016  . Chronic low back pain 06/05/2016  . Diabetic peripheral neuropathy ([place]) 12/01/2015  . Overweight (BMI 25.0-29.9) 08/31/2015  . Migraine 12/26/2014  . GAD (generalized anxiety disorder) 10/25/2013  . GERD (gastroesophageal reflux disease) 10/25/2013  . Depression 12/29/2012  . 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 Diabetes mellitus ([place]) 06/27/2010  . Hyperlipidemia associated with type 2 diabetes mellitus ([place]) 06/27/2010    Past Surgical History:  Procedure Laterality Date  . ABDOMINAL HYSTERECTOMY    . BACK SURGERY    . CESAREAN SECTION     x 2  . NECK SURGERY       OB History   No obstetric history on file.      Home Medications    Prior to Admission medications   Medication Sig Start Date End Date Taking? Authorizing Provider  ACCU-CHEK FASTCLIX LANCETS MISC EVERY DAY 04/14/17   Sharion Balloon, FNP  ACCU-CHEK GUIDE test strip EVERY DAY 09/05/17   Evelina Dun A, FNP  albuterol (PROVENTIL) (2.5 MG/3ML) 0.083% nebulizer solution Take 3 mLs (2.5 mg total) by nebulization every 6 (six) hours as needed for wheezing or shortness of breath. 04/01/18  Dettinger, Fransisca Kaufmann, MD  ALPRAZolam Duanne Moron) 1 MG tablet TAKE ONE TABLET AT BEDTIME AS NEEDED 04/16/18   Cloria Spring, MD  Ascorbic Acid (VITAMIN C PO) Take by mouth. ImmuBlast    [provider]  atorvastatin (LIPITOR) 20 MG tablet TAKE ONE (1) TABLET EACH DAY 09/05/17   Hawks, Christy A, FNP  azithromycin (ZITHROMAX) 250 MG tablet Take 2 the first day and then one each day after. 04/01/18   Dettinger, Fransisca Kaufmann, MD  GAVILAX powder MIX 17 GRAMS TWICE DAILY AS NEEDED 12/02/17   Evelina Dun A, FNP  HYDROcodone-acetaminophen (NORCO/VICODIN) 5-325 MG tablet Take 1 tablet by mouth daily.    [provider]  IRON PO Take 27 mg by mouth.    [provider]  JARDIANCE 10 MG TABS tablet TAKE ONE (1) TABLET EACH DAY 02/17/18   Evelina Dun A, FNP  lisinopril (PRINIVIL,ZESTRIL) 5 MG tablet TAKE ONE (1) TABLET EACH DAY 04/17/18   Evelina Dun A, FNP  metFORMIN (GLUCOPHAGE) 1000 MG tablet TAKE ONE TABLET TWICE A DAY WITH FOOD 03/20/18   Evelina Dun A, FNP  naproxen (NAPROSYN) 500 MG tablet TAKE ONE TABLET TWICE A DAY WITH FOOD 01/01/18   Evelina Dun A, FNP  omeprazole (PRILOSEC) 20 MG capsule TAKE ONE (1) CAPSULE EACH DAY 03/06/18   Evelina Dun A, FNP  pioglitazone (ACTOS) 15 MG tablet TAKE ONE (1) TABLET EACH DAY 02/03/18   Hawks, Christy A, FNP  predniSONE (DELTASONE) 20 MG tablet Take 2 tablets (40 mg total) by mouth daily. 04/30/18   Noemi Chapel, MD  venlafaxine XR (EFFEXOR-XR) 150 MG 24 hr capsule Take 2 capsules (300 mg total) by mouth at bedtime. Take 2 in the pm 03/11/18   Cloria Spring, MD    Family History Family History  Problem Relation Age of Onset  . ADD / ADHD Other   . COPD Father   . Alcohol abuse Father   . Depression Daughter   . Alcohol abuse Mother   . COPD Mother        on oxygen  . Colon cancer Paternal Grandfather   . Heart disease Sister   . Heart disease Brother   . Arthritis Sister        knee replacement  . Diabetes Maternal Grandmother   . Diabetes Sister     Social History Social History   Tobacco Use  . Smoking status: Passive Smoke Exposure - Never Smoker  . Smokeless tobacco: Never Used  Substance Use Topics  . Alcohol use: No  . Drug use: No     Allergies   Cymbalta [duloxetine hcl]; Gabapentin; Meloxicam; and Tramadol   Review of Systems Review of Systems  All other systems reviewed and are negative.    Physical Exam Updated Vital Signs BP 116/70   Pulse 83   Temp 98.3 F (36.8 C) (Oral)   Resp 13   Ht 1.499 m (4\' 11" )   Wt 67.1 kg   SpO2 95%   BMI 29.89 kg/m   Physical Exam Vitals signs and nursing note reviewed.  Constitutional:      General: She is not in acute distress.    Appearance: She is well-developed.  HENT:     Head:  Normocephalic and atraumatic.     Mouth/Throat:     Pharynx: No oropharyngeal exudate.  Eyes:     General: No scleral icterus.       Right eye: No discharge.  Left eye: No discharge.     Conjunctiva/sclera: Conjunctivae normal.     Pupils: Pupils are equal, round, and reactive to light.  Neck:     Musculoskeletal: Normal range of motion and neck supple.     Thyroid: No thyromegaly.     Vascular: No JVD.  Cardiovascular:     Rate and Rhythm: Normal rate and regular rhythm.     Heart sounds: Normal heart sounds. No murmur. No friction rub. No gallop.   Pulmonary:     Effort: Pulmonary effort is normal. No respiratory distress.     Breath sounds: Normal breath sounds. No wheezing or rales.  Chest:     Comments: The patient reports having some discomfort with palpation over the chest and states it feels like a heaviness not similar to the burning and stinging sensation Abdominal:     General: Bowel sounds are normal. There is no distension.     Palpations: Abdomen is soft. There is no mass.     Tenderness: There is no abdominal tenderness.  Musculoskeletal: Normal range of motion.        General: No tenderness.     Comments: There is no edema of the lower extremities, normal range of motion of the upper and lower extremities with normal strength bilaterally, no rashes  Lymphadenopathy:     Cervical: No cervical adenopathy.  Skin:    General: Skin is warm and dry.     Findings: No erythema or rash.  Neurological:     Mental Status: She is alert.     Coordination: Coordination normal.     Comments: The patient has normal strength and sensation of all 4 extremities except for the right hand which she states feels like pins-and-needles, it is stocking glove distribution  Psychiatric:        Behavior: Behavior normal.      ED Treatments / Results  Labs (all labs ordered are listed, but only abnormal results are displayed) Labs Reviewed  COMPREHENSIVE METABOLIC PANEL -  Abnormal; Notable for the following components:      Result Value   Glucose, Bld 134 (*)    All other components within normal limits  CBC WITH DIFFERENTIAL/PLATELET  TROPONIN I    EKG EKG Interpretation  Date/Time:  Thursday April 30 2018 14:17:23 EST Ventricular Rate:  87 PR Interval:    QRS Duration: 91 QT Interval:  391 QTC Calculation: 471 R Axis:   145 Text Interpretation:  Sinus rhythm Left posterior fascicular block Low voltage, precordial leads Abnormal R-wave progression, late transition Baseline wander in lead(s) V6 since last tracing no significant change Confirmed by Noemi Chapel [id number]) on 04/30/2018 2:27:56 PM   Radiology Dg Chest 2 View  Result Date: 04/30/2018 CLINICAL DATA:  Chest pain. EXAM: CHEST - 2 VIEW COMPARISON:  12/26/2014 FINDINGS: Cardiomediastinal silhouette is normal. Mediastinal contours appear intact. There is no evidence of focal airspace consolidation, pleural effusion or pneumothorax. Osseous structures are without acute abnormality. Soft tissues are grossly normal. IMPRESSION: No active cardiopulmonary disease. Electronically Signed   By: Fidela Salisbury M.D.   On: 04/30/2018 15:42   Dg Cervical Spine Complete  Result Date: 04/30/2018 CLINICAL DATA:  Right hand numbness and tingling for 3 days EXAM: CERVICAL SPINE - COMPLETE 4+ VIEW COMPARISON:  06/03/2008 FINDINGS: Postsurgical changes are noted at C5-6 stable from the prior exam. Mild degenerative changes are noted at C4-5 which have progressed in the interval from the prior exam. Degenerative changes at C6-7 and C7-T1 are  noted stable from the previous study. Mild neural foraminal narrowing is noted at C6-7 bilaterally and to a lesser degree at C3-4 bilaterally. Facet hypertrophic changes are noted. The odontoid is within normal limits. IMPRESSION: Degenerative and postoperative changes.  No acute abnormality noted. Electronically Signed   By: Inez Catalina M.D.   On: 04/30/2018 15:40     Procedures Procedures (including critical care time)  Medications Ordered in ED Medications  predniSONE (DELTASONE) tablet 40 mg (has no administration in time range)  morphine 4 MG/ML injection 4 mg (4 mg Intravenous Given 04/30/18 1456)     Initial Impression / Assessment and Plan / ED Course  I have reviewed the triage vital signs and the nursing notes.  Pertinent labs & imaging results that were available during my care of the patient were reviewed by me and considered in my medical decision making (see chart for details).  Clinical Course as of Apr 30 1553  Thu Apr 30, 2018  1545 All of the labs are unremarkable, there is no significant findings including her CBC troponin and basic metabolic panel.  Both her chest x-ray and cervical spine x-rays are unremarkable and her EKG is nonischemic.  Her symptoms been going on for several days, it is highly unlikely that this would be related to a coronary event.  At this time the patient will be treated with an anti-inflammatory, she would likely benefit from a short course of a steroid as well in case this is a radiculopathy.  Long-term other options might be Neurontin however in the short-term we will stick with above treatment.  Patient agreeable   [BM]    Clinical Course User Index [BM] Noemi Chapel, MD    The location of the patient's symptoms is remarkable for what is more than likely going to end up being a radiculopathy more than a cardiac source.  Her EKG thankfully is unremarkable showing no signs of ST depression elevation or T wave abnormalities.  It is rather unremarkable.  Will obtain a troponin to confirm as well as x-rays of the neck.  As a known diabetic I did discuss with the patient that she would likely have some increasing blood sugars with the prednisone but that it was necessary to reduce the swelling on a nerve that might be being pinched.  She is agreeable  Final Clinical Impressions(s) / ED Diagnoses   Final  diagnoses:  Acute cervical radiculopathy    ED Discharge Orders         Ordered    predniSONE (DELTASONE) 20 MG tablet  Daily     04/30/18 1553           Noemi Chapel, MD 04/30/18 1555

## 2018-04-30 NOTE — ED Notes (Signed)
Patient transported to X-ray 

## 2018-04-30 NOTE — Discharge Instructions (Signed)
Your testing today showed normal x-rays of your neck and chest as well as normal blood work.  I would strongly encourage you to follow-up with your family doctor this week as well as with your orthopedic surgeon who did your neck surgery as this may be related to a pinched nerve in your neck.  Please take prednisone daily for 5 days but be aware that because you are a diabetic this medication will likely cause your blood sugar to go up.  It is extremely important that you monitor your blood sugar closely for the next few days and eat a very low sugar diet taking your medications exactly as prescribed.  If you should develop increasing numbness or weakness of the arm, return to the emergency department immediately.

## 2018-04-30 NOTE — ED Notes (Signed)
ED Provider at bedside. 

## 2018-05-05 ENCOUNTER — Other Ambulatory Visit: Payer: Self-pay | Admitting: Family

## 2018-05-05 NOTE — Telephone Encounter (Signed)
Next Ov 06/09/18

## 2018-05-18 ENCOUNTER — Other Ambulatory Visit: Payer: Self-pay | Admitting: Family

## 2018-06-09 ENCOUNTER — Other Ambulatory Visit: Payer: Self-pay

## 2018-06-09 ENCOUNTER — Ambulatory Visit: Payer: Medicaid Other | Admitting: Family

## 2018-06-09 ENCOUNTER — Encounter: Payer: Self-pay | Admitting: Family

## 2018-06-09 VITALS — BP 107/67 | HR 83 | Temp 98.1°F | Ht 59.0 in | Wt 139.2 lb

## 2018-06-09 DIAGNOSIS — E1142 Type 2 diabetes mellitus with diabetic polyneuropathy: Secondary | ICD-10-CM | POA: Diagnosis not present

## 2018-06-09 DIAGNOSIS — E785 Hyperlipidemia, unspecified: Secondary | ICD-10-CM

## 2018-06-09 DIAGNOSIS — E663 Overweight: Secondary | ICD-10-CM

## 2018-06-09 DIAGNOSIS — K219 Gastro-esophageal reflux disease without esophagitis: Secondary | ICD-10-CM | POA: Diagnosis not present

## 2018-06-09 DIAGNOSIS — F321 Major depressive disorder, single episode, moderate: Secondary | ICD-10-CM | POA: Insufficient documentation

## 2018-06-09 DIAGNOSIS — E1169 Type 2 diabetes mellitus with other specified complication: Secondary | ICD-10-CM | POA: Diagnosis not present

## 2018-06-09 DIAGNOSIS — Z8249 Family history of ischemic heart disease and other diseases of the circulatory system: Secondary | ICD-10-CM

## 2018-06-09 DIAGNOSIS — G8929 Other chronic pain: Secondary | ICD-10-CM

## 2018-06-09 DIAGNOSIS — F331 Major depressive disorder, recurrent, moderate: Secondary | ICD-10-CM

## 2018-06-09 DIAGNOSIS — F411 Generalized anxiety disorder: Secondary | ICD-10-CM

## 2018-06-09 DIAGNOSIS — D509 Iron deficiency anemia, unspecified: Secondary | ICD-10-CM

## 2018-06-09 DIAGNOSIS — M545 Low back pain: Secondary | ICD-10-CM

## 2018-06-09 LAB — BAYER DCA HB A1C WAIVED: HB A1C (BAYER DCA - WAIVED): 6.8 % (ref <7.0)

## 2018-06-09 NOTE — Progress Notes (Signed)
Subjective:    Patient ID: Charlotte Perez, female    DOB: [birth year], 58 y.o.   MRN: [id number]   Chief Complaint  Patient presents with  . Medical Management of Chronic Issues   PT presents to the office today for chronic follow up. Pt is followed by Behavioral health for ADHD, GAD, and Depressionevery 3 months.Pt is followed by Pain Clinic every  month for chronic back pain. All these are stable and doing "well". Gastroesophageal Reflux  She complains of belching and heartburn. She reports no coughing. This is a chronic problem. The current episode started more than 1 year ago. The problem occurs occasionally. The problem has been waxing and waning. She has tried a PPI for the symptoms. The treatment provided moderate relief.  Hyperlipidemia  The current episode started more than 1 year ago. The problem is controlled. Recent lipid tests were reviewed and are normal. Current antihyperlipidemic treatment includes statins. The current treatment provides moderate improvement of lipids. Risk factors for coronary artery disease include dyslipidemia, diabetes mellitus, hypertension, a sedentary lifestyle and post-menopausal.  Diabetes  She presents for her follow-up diabetic visit. She has type 2 diabetes mellitus. Her disease course has been stable. Hypoglycemia symptoms include nervousness/anxiousness. Associated symptoms include foot paresthesias. Pertinent negatives for diabetes include no blurred vision and no visual change. Symptoms are stable. Diabetic complications include peripheral neuropathy. Pertinent negatives for diabetic complications include no CVA or heart disease. Risk factors for coronary artery disease include dyslipidemia, diabetes mellitus, hypertension and sedentary lifestyle. Her weight is stable. She is following a generally unhealthy diet. Her overall blood glucose range is 90-110 mg/dl. Eye exam is not current.  Anemia  Presents for follow-up visit. Symptoms include  malaise/fatigue.  Anxiety  Presents for follow-up visit. Symptoms include decreased concentration, excessive worry, irritability, nervous/anxious behavior and restlessness. Symptoms occur occasionally. The severity of symptoms is moderate. The quality of sleep is good.   Her past medical history is significant for anemia.  Depression         This is a chronic problem.  The current episode started more than 1 year ago.   The onset quality is gradual.   The problem occurs intermittently.  The problem has been waxing and waning since onset.  Associated symptoms include decreased concentration, irritable, restlessness, decreased interest and sad.  Associated symptoms include no helplessness and no hopelessness.  Past treatments include SNRIs - Serotonin and norepinephrine reuptake inhibitors.  Past medical history includes anxiety.   Diabetic Neuropathy States she has burning and tingling in both of 7 out 10.     Review of Systems  Constitutional: Positive for irritability and malaise/fatigue.  Eyes: Negative for blurred vision.  Respiratory: Negative for cough.   Gastrointestinal: Positive for heartburn.  Psychiatric/Behavioral: Positive for decreased concentration and depression. The patient is nervous/anxious.   All other systems reviewed and are negative.      Objective:   Physical Exam Vitals signs reviewed.  Constitutional:      General: She is irritable. She is not in acute distress.    Appearance: She is well-developed.  HENT:     Head: Normocephalic and atraumatic.     Right Ear: Tympanic membrane normal.     Left Ear: Tympanic membrane normal.  Eyes:     Pupils: Pupils are equal, round, and reactive to light.  Neck:     Musculoskeletal: Normal range of motion and neck supple.     Thyroid: No thyromegaly.  Cardiovascular:  Rate and Rhythm: Normal rate and regular rhythm.     Heart sounds: Normal heart sounds. No murmur.  Pulmonary:     Effort: Pulmonary effort is  normal. No respiratory distress.     Breath sounds: Normal breath sounds. No wheezing.  Abdominal:     General: Bowel sounds are normal. There is no distension.     Palpations: Abdomen is soft.     Tenderness: There is no abdominal tenderness.  Musculoskeletal:        General: No tenderness.     Comments: Mild pain with flexion   Skin:    General: Skin is warm and dry.  Neurological:     Mental Status: She is alert and oriented to person, place, and time.     Cranial Nerves: No cranial nerve deficit.     Deep Tendon Reflexes: Reflexes are normal and symmetric.  Psychiatric:        Behavior: Behavior normal.        Thought Content: Thought content normal.        Judgment: Judgment normal.       BP 107/67   Pulse 83   Temp 98.1 F (36.7 C) (Oral)   Ht 4' 11"  (1.499 m)   Wt 139 lb 3.2 oz (63.1 kg)   BMI 28.11 kg/m      Assessment & Plan:  Charlotte Perez comes in today with chief complaint of Medical Management of Chronic Issues   Diagnosis and orders addressed:  1. Diabetic peripheral neuropathy (HCC) - CMP14+EGFR - CBC with Differential/Platelet - Ambulatory referral to Cardiology  2. Moderate single current episode of major depressive disorder (HCC) - CMP14+EGFR - CBC with Differential/Platelet  3. Gastroesophageal reflux disease without esophagitis - CMP14+EGFR - CBC with Differential/Platelet  4. Type 2 diabetes mellitus with diabetic polyneuropathy, without long-term current use of insulin (HCC) - Bayer DCA Hb A1c Waived - CMP14+EGFR - CBC with Differential/Platelet - Ambulatory referral to Cardiology  5. Hyperlipidemia associated with type 2 diabetes mellitus ([place]) - CMP14+EGFR - Lipid panel - CBC with Differential/Platelet - Ambulatory referral to Cardiology  6. Chronic midline low back pain without sciatica - CMP14+EGFR - CBC with Differential/Platelet  7. Moderate episode of recurrent major depressive disorder (HCC) - CMP14+EGFR - CBC  with Differential/Platelet  8. Iron deficiency anemia, unspecified iron deficiency anemia type - CMP14+EGFR - CBC with Differential/Platelet  9. GAD (generalized anxiety disorder) - CMP14+EGFR - CBC with Differential/Platelet  10. Overweight (BMI 25.0-29.9) - CMP14+EGFR - CBC with Differential/Platelet  11. Family history of cardiovascular disease - Ambulatory referral to Cardiology   Labs pending Health Maintenance reviewed Diet and exercise encouraged  Follow up plan: 4 months    Evelina Dun, FNP

## 2018-06-09 NOTE — Patient Instructions (Signed)
Diabetes Mellitus and Nutrition, Adult  When you have diabetes (diabetes mellitus), it is very important to have healthy eating habits because your blood sugar (glucose) levels are greatly affected by what you eat and drink. Eating healthy foods in the appropriate amounts, at about the same times every day, can help you:  · Control your blood glucose.  · Lower your risk of heart disease.  · Improve your blood pressure.  · Reach or maintain a healthy weight.  Every person with diabetes is different, and each person has different needs for a meal plan. Your health care provider may recommend that you work with a diet and nutrition specialist (dietitian) to make a meal plan that is best for you. Your meal plan may vary depending on factors such as:  · The calories you need.  · The medicines you take.  · Your weight.  · Your blood glucose, blood pressure, and cholesterol levels.  · Your activity level.  · Other health conditions you have, such as heart or kidney disease.  How do carbohydrates affect me?  Carbohydrates, also called carbs, affect your blood glucose level more than any other type of food. Eating carbs naturally raises the amount of glucose in your blood. Carb counting is a method for keeping track of how many carbs you eat. Counting carbs is important to keep your blood glucose at a healthy level, especially if you use insulin or take certain oral diabetes medicines.  It is important to know how many carbs you can safely have in each meal. This is different for every person. Your dietitian can help you calculate how many carbs you should have at each meal and for each snack.  Foods that contain carbs include:  · Bread, cereal, rice, pasta, and crackers.  · Potatoes and corn.  · Peas, beans, and lentils.  · Milk and yogurt.  · Fruit and juice.  · Desserts, such as cakes, cookies, ice cream, and candy.  How does alcohol affect me?  Alcohol can cause a sudden decrease in blood glucose (hypoglycemia),  especially if you use insulin or take certain oral diabetes medicines. Hypoglycemia can be a life-threatening condition. Symptoms of hypoglycemia (sleepiness, dizziness, and confusion) are similar to symptoms of having too much alcohol.  If your health care provider says that alcohol is safe for you, follow these guidelines:  · Limit alcohol intake to no more than 1 drink per day for nonpregnant women and 2 drinks per day for men. One drink equals 12 oz of beer, 5 oz of wine, or 1½ oz of hard liquor.  · Do not drink on an empty stomach.  · Keep yourself hydrated with water, diet soda, or unsweetened iced tea.  · Keep in mind that regular soda, juice, and other mixers may contain a lot of sugar and must be counted as carbs.  What are tips for following this plan?    Reading food labels  · Start by checking the serving size on the "Nutrition Facts" label of packaged foods and drinks. The amount of calories, carbs, fats, and other nutrients listed on the label is based on one serving of the item. Many items contain more than one serving per package.  · Check the total grams (g) of carbs in one serving. You can calculate the number of servings of carbs in one serving by dividing the total carbs by 15. For example, if a food has 30 g of total carbs, it would be equal to 2   servings of carbs.  · Check the number of grams (g) of saturated and trans fats in one serving. Choose foods that have low or no amount of these fats.  · Check the number of milligrams (mg) of salt (sodium) in one serving. Most people should limit total sodium intake to less than 2,300 mg per day.  · Always check the nutrition information of foods labeled as "low-fat" or "nonfat". These foods may be higher in added sugar or refined carbs and should be avoided.  · Talk to your dietitian to identify your daily goals for nutrients listed on the label.  Shopping  · Avoid buying canned, premade, or processed foods. These foods tend to be high in fat, sodium,  and added sugar.  · Shop around the outside edge of the grocery store. This includes fresh fruits and vegetables, bulk grains, fresh meats, and fresh dairy.  Cooking  · Use low-heat cooking methods, such as baking, instead of high-heat cooking methods like deep frying.  · Cook using healthy oils, such as olive, canola, or sunflower oil.  · Avoid cooking with butter, cream, or high-fat meats.  Meal planning  · Eat meals and snacks regularly, preferably at the same times every day. Avoid going long periods of time without eating.  · Eat foods high in fiber, such as fresh fruits, vegetables, beans, and whole grains. Talk to your dietitian about how many servings of carbs you can eat at each meal.  · Eat 4-6 ounces (oz) of lean protein each day, such as lean meat, chicken, fish, eggs, or tofu. One oz of lean protein is equal to:  ? 1 oz of meat, chicken, or fish.  ? 1 egg.  ? ¼ cup of tofu.  · Eat some foods each day that contain healthy fats, such as avocado, nuts, seeds, and fish.  Lifestyle  · Check your blood glucose regularly.  · Exercise regularly as told by your health care provider. This may include:  ? 150 minutes of moderate-intensity or vigorous-intensity exercise each week. This could be brisk walking, biking, or water aerobics.  ? Stretching and doing strength exercises, such as yoga or weightlifting, at least 2 times a week.  · Take medicines as told by your health care provider.  · Do not use any products that contain nicotine or tobacco, such as cigarettes and e-cigarettes. If you need help quitting, ask your health care provider.  · Work with a counselor or diabetes educator to identify strategies to manage stress and any emotional and social challenges.  Questions to ask a health care provider  · Do I need to meet with a diabetes educator?  · Do I need to meet with a dietitian?  · What number can I call if I have questions?  · When are the best times to check my blood glucose?  Where to find more  information:  · American Diabetes Association: diabetes.org  · Academy of Nutrition and Dietetics: www.eatright.org  · National Institute of Diabetes and Digestive and Kidney Diseases (NIH): www.niddk.nih.gov  Summary  · A healthy meal plan will help you control your blood glucose and maintain a healthy lifestyle.  · Working with a diet and nutrition specialist (dietitian) can help you make a meal plan that is best for you.  · Keep in mind that carbohydrates (carbs) and alcohol have immediate effects on your blood glucose levels. It is important to count carbs and to use alcohol carefully.  This information is not intended to   replace advice given to you by your health care provider. Make sure you discuss any questions you have with your health care provider.  Document Released: 12/06/2004 Document Revised: 10/09/2016 Document Reviewed: 04/15/2016  Elsevier Interactive Patient Education © 2019 [employer] Inc[employer]

## 2018-06-10 ENCOUNTER — Ambulatory Visit (HOSPITAL_COMMUNITY): Payer: Medicaid Other | Admitting: Psychiatry

## 2018-06-10 ENCOUNTER — Encounter (HOSPITAL_COMMUNITY): Payer: Self-pay | Admitting: Psychiatry

## 2018-06-10 VITALS — BP 118/74 | HR 83 | Ht 59.0 in | Wt 137.0 lb

## 2018-06-10 DIAGNOSIS — Z818 Family history of other mental and behavioral disorders: Secondary | ICD-10-CM

## 2018-06-10 DIAGNOSIS — Z811 Family history of alcohol abuse and dependence: Secondary | ICD-10-CM

## 2018-06-10 DIAGNOSIS — F321 Major depressive disorder, single episode, moderate: Secondary | ICD-10-CM | POA: Diagnosis not present

## 2018-06-10 LAB — LIPID PANEL
Chol/HDL Ratio: 2.1 ratio (ref 0.0–4.4)
Cholesterol, Total: 159 mg/dL (ref 100–199)
HDL: 77 mg/dL (ref >39)
LDL CALC: 71 mg/dL (ref 0–99)
Triglycerides: 57 mg/dL (ref 0–149)
VLDL Cholesterol Cal: 11 mg/dL (ref 5–40)

## 2018-06-10 LAB — CBC WITH DIFFERENTIAL/PLATELET
Basophils Absolute: 0.1 10*3/uL (ref 0.0–0.2)
Basos: 1 %
EOS (ABSOLUTE): 0.2 10*3/uL (ref 0.0–0.4)
Eos: 4 %
Hematocrit: 39 % (ref 34.0–46.6)
Hemoglobin: 12.7 g/dL (ref 11.1–15.9)
Immature Grans (Abs): 0 10*3/uL (ref 0.0–0.1)
Immature Granulocytes: 0 %
Lymphocytes Absolute: 1.4 10*3/uL (ref 0.7–3.1)
Lymphs: 32 %
MCH: 28.6 pg (ref 26.6–33.0)
MCHC: 32.6 g/dL (ref 31.5–35.7)
MCV: 88 fL (ref 79–97)
Monocytes Absolute: 0.4 10*3/uL (ref 0.1–0.9)
Monocytes: 10 %
NEUTROS PCT: 53 %
Neutrophils Absolute: 2.4 10*3/uL (ref 1.4–7.0)
Platelets: 236 10*3/uL (ref 150–450)
RBC: 4.44 x10E6/uL (ref 3.77–5.28)
RDW: 13 % (ref 11.7–15.4)
WBC: 4.4 10*3/uL (ref 3.4–10.8)

## 2018-06-10 LAB — CMP14+EGFR
ALK PHOS: 63 IU/L (ref 39–117)
ALT: 16 IU/L (ref 0–32)
AST: 16 IU/L (ref 0–40)
Albumin/Globulin Ratio: 1.9 (ref 1.2–2.2)
Albumin: 4.2 g/dL (ref 3.8–4.9)
BUN/Creatinine Ratio: 19 (ref 9–23)
BUN: 12 mg/dL (ref 6–24)
Bilirubin Total: 0.2 mg/dL (ref 0.0–1.2)
CO2: 27 mmol/L (ref 20–29)
CREATININE: 0.64 mg/dL (ref 0.57–1.00)
Calcium: 10 mg/dL (ref 8.7–10.2)
Chloride: 99 mmol/L (ref 96–106)
GFR calc Af Amer: 114 mL/min/{1.73_m2} (ref >59)
GFR calc non Af Amer: 99 mL/min/{1.73_m2} (ref >59)
GLOBULIN, TOTAL: 2.2 g/dL (ref 1.5–4.5)
Glucose: 130 mg/dL — ABNORMAL HIGH (ref 65–99)
Potassium: 5.2 mmol/L (ref 3.5–5.2)
Sodium: 139 mmol/L (ref 134–144)
Total Protein: 6.4 g/dL (ref 6.0–8.5)

## 2018-06-10 MED ORDER — ALPRAZOLAM 1 MG PO TABS: ORAL_TABLET | ORAL | 2 refills | Status: DC

## 2018-06-10 MED ORDER — VENLAFAXINE HCL ER 150 MG PO CP24: 300.0000 mg | ORAL_CAPSULE | Freq: Every day | ORAL | 2 refills | Status: DC
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
## 2018-06-23 ENCOUNTER — Telehealth: Payer: Self-pay | Admitting: Family

## 2018-06-23 NOTE — Telephone Encounter (Signed)
Pt given symptoms of coronavirus Questions answered

## 2018-07-09 ENCOUNTER — Telehealth: Payer: Self-pay | Admitting: *Deleted

## 2018-07-09 NOTE — Telephone Encounter (Signed)
Pt contacted per Domenic Polite. History reviewed. No symptoms to suggest any unstable cardiac conditions. Based on discussion, with current pandemic situation, we have postponed 07/16/18 appointment until July 2020. If symptoms change, pt has been instructed to contact our office

## 2018-07-16 ENCOUNTER — Ambulatory Visit: Payer: Self-pay | Admitting: Cardiology

## 2018-07-20 ENCOUNTER — Other Ambulatory Visit: Payer: Self-pay | Admitting: Family

## 2018-07-20 DIAGNOSIS — E1142 Type 2 diabetes mellitus with diabetic polyneuropathy: Secondary | ICD-10-CM

## 2018-07-30 ENCOUNTER — Other Ambulatory Visit: Payer: Self-pay | Admitting: Family

## 2018-08-11 LAB — HM DIABETES EYE EXAM

## 2018-08-14 ENCOUNTER — Other Ambulatory Visit: Payer: Self-pay | Admitting: Family

## 2018-09-02 ENCOUNTER — Other Ambulatory Visit: Payer: Self-pay | Admitting: Family

## 2018-09-02 DIAGNOSIS — E1165 Type 2 diabetes mellitus with hyperglycemia: Secondary | ICD-10-CM

## 2018-09-10 ENCOUNTER — Ambulatory Visit (HOSPITAL_COMMUNITY): Payer: Self-pay | Admitting: Psychiatry

## 2018-09-17 ENCOUNTER — Other Ambulatory Visit: Payer: Self-pay

## 2018-09-17 ENCOUNTER — Ambulatory Visit (INDEPENDENT_AMBULATORY_CARE_PROVIDER_SITE_OTHER): Payer: Medicaid Other | Admitting: Psychiatry

## 2018-09-17 ENCOUNTER — Encounter (HOSPITAL_COMMUNITY): Payer: Self-pay | Admitting: Psychiatry

## 2018-09-17 DIAGNOSIS — F321 Major depressive disorder, single episode, moderate: Secondary | ICD-10-CM

## 2018-09-17 MED ORDER — VENLAFAXINE HCL ER 150 MG PO CP24: 300.0000 mg | ORAL_CAPSULE | Freq: Every day | ORAL | 2 refills | Status: DC

## 2018-09-17 MED ORDER — ALPRAZOLAM 1 MG PO TABS: ORAL_TABLET | ORAL | 2 refills | Status: DC
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
## 2018-09-30 ENCOUNTER — Other Ambulatory Visit (HOSPITAL_COMMUNITY): Payer: Self-pay | Admitting: Family

## 2018-09-30 ENCOUNTER — Telehealth: Payer: Self-pay | Admitting: Family

## 2018-09-30 DIAGNOSIS — Z1231 Encounter for screening mammogram for malignant neoplasm of breast: Secondary | ICD-10-CM

## 2018-09-30 DIAGNOSIS — N644 Mastodynia: Secondary | ICD-10-CM

## 2018-10-01 NOTE — Telephone Encounter (Signed)
Orders placed.

## 2018-10-02 ENCOUNTER — Other Ambulatory Visit: Payer: Self-pay | Admitting: Family

## 2018-10-02 DIAGNOSIS — N644 Mastodynia: Secondary | ICD-10-CM

## 2018-10-06 ENCOUNTER — Ambulatory Visit (HOSPITAL_COMMUNITY): Payer: Medicaid Other

## 2018-10-06 ENCOUNTER — Ambulatory Visit (HOSPITAL_COMMUNITY): Admission: RE | Admit: 2018-10-06 | Payer: Medicaid Other | Source: Ambulatory Visit

## 2018-10-08 ENCOUNTER — Other Ambulatory Visit: Payer: Self-pay

## 2018-10-09 ENCOUNTER — Other Ambulatory Visit: Payer: Self-pay | Admitting: Family

## 2018-10-09 ENCOUNTER — Ambulatory Visit (INDEPENDENT_AMBULATORY_CARE_PROVIDER_SITE_OTHER): Payer: Medicaid Other | Admitting: Family

## 2018-10-09 ENCOUNTER — Encounter: Payer: Self-pay | Admitting: Family

## 2018-10-09 ENCOUNTER — Telehealth: Payer: Self-pay | Admitting: Cardiology

## 2018-10-09 DIAGNOSIS — E663 Overweight: Secondary | ICD-10-CM

## 2018-10-09 DIAGNOSIS — D509 Iron deficiency anemia, unspecified: Secondary | ICD-10-CM

## 2018-10-09 DIAGNOSIS — M545 Low back pain: Secondary | ICD-10-CM

## 2018-10-09 DIAGNOSIS — E1169 Type 2 diabetes mellitus with other specified complication: Secondary | ICD-10-CM | POA: Diagnosis not present

## 2018-10-09 DIAGNOSIS — F411 Generalized anxiety disorder: Secondary | ICD-10-CM

## 2018-10-09 DIAGNOSIS — K5900 Constipation, unspecified: Secondary | ICD-10-CM

## 2018-10-09 DIAGNOSIS — G8929 Other chronic pain: Secondary | ICD-10-CM

## 2018-10-09 DIAGNOSIS — E1142 Type 2 diabetes mellitus with diabetic polyneuropathy: Secondary | ICD-10-CM

## 2018-10-09 DIAGNOSIS — F321 Major depressive disorder, single episode, moderate: Secondary | ICD-10-CM

## 2018-10-09 DIAGNOSIS — E785 Hyperlipidemia, unspecified: Secondary | ICD-10-CM

## 2018-10-09 DIAGNOSIS — K219 Gastro-esophageal reflux disease without esophagitis: Secondary | ICD-10-CM | POA: Diagnosis not present

## 2018-10-09 DIAGNOSIS — F331 Major depressive disorder, recurrent, moderate: Secondary | ICD-10-CM

## 2018-10-09 NOTE — Progress Notes (Signed)
Virtual Visit via telephone Note  I connected with Charlotte Perez on 10/09/18 at 08:23 Am by telephone and verified that I am speaking with the correct person using two identifiers. Charlotte Perez is currently located at home and husband is currently with her during visit. The provider, Evelina Dun, FNP is located in their office at time of visit.  I discussed the limitations, risks, security and privacy concerns of performing an evaluation and management service by telephone and the availability of in person appointments. I also discussed with the patient that there may be a patient responsible charge related to this service. The patient expressed understanding and agreed to proceed.   History and Present Illness:  PT presents to the office today for chronic follow up. Pt is followed by Behavioral health for ADHD, GAD, and Depressionevery 3 months.Pt is followed by Pain Clinic every  month for chronic back pain. All these are stable and doing "well". Gastroesophageal Reflux She complains of belching and heartburn. She reports no coughing or no nausea. This is a chronic problem. The current episode started more than 1 year ago. The problem occurs occasionally. The problem has been waxing and waning. She has tried a PPI for the symptoms. The treatment provided moderate relief.  Hyperlipidemia This is a chronic problem. The current episode started more than 1 year ago. The problem is controlled. Recent lipid tests were reviewed and are normal. Associated symptoms include leg pain. Current antihyperlipidemic treatment includes statins. The current treatment provides moderate improvement of lipids. Risk factors for coronary artery disease include dyslipidemia, diabetes mellitus, a sedentary lifestyle and post-menopausal.  Diabetes She presents for her follow-up diabetic visit. She has type 2 diabetes mellitus. Hypoglycemia symptoms include nervousness/anxiousness. Associated symptoms include  foot paresthesias and weakness. Pertinent negatives for diabetes include no blurred vision and no visual change. There are no hypoglycemic complications. Symptoms are stable. Diabetic complications include peripheral neuropathy. Pertinent negatives for diabetic complications include no CVA or heart disease. Risk factors for coronary artery disease include dyslipidemia, diabetes mellitus, hypertension, sedentary lifestyle and post-menopausal. She is following a generally unhealthy diet. Her overall blood glucose range is 90-110 mg/dl. Eye exam is current.  Anemia Presents for follow-up visit. There has been no bruising/bleeding easily or malaise/fatigue.  Anxiety Presents for follow-up visit. Symptoms include decreased concentration, depressed mood, excessive worry, irritability, nervous/anxious behavior and restlessness. Patient reports no nausea.   Her past medical history is significant for anemia.  Depression        This is a chronic problem.  The current episode started more than 1 year ago.   The onset quality is gradual.   The problem occurs intermittently.  The problem has been waxing and waning since onset.  Associated symptoms include decreased concentration, irritable and restlessness.  Associated symptoms include no helplessness and no hopelessness.  Past treatments include SNRIs - Serotonin and norepinephrine reuptake inhibitors.  Past medical history includes anxiety.   Back Pain This is a chronic problem. The current episode started more than 1 year ago. The problem occurs intermittently. The problem has been waxing and waning since onset. The pain is present in the lumbar spine. The pain is at a severity of 5/10. The pain is moderate. Associated symptoms include leg pain and weakness. Pertinent negatives include no bladder incontinence or bowel incontinence. She has tried bed rest for the symptoms. The treatment provided moderate relief.  Diabetic Neuropathy Complaining of bilateral  burning and tingling of 9 out 10.  Review of Systems  Constitutional: Positive for irritability. Negative for malaise/fatigue.  Eyes: Negative for blurred vision.  Respiratory: Negative for cough.   Gastrointestinal: Positive for heartburn. Negative for bowel incontinence and nausea.  Genitourinary: Negative for bladder incontinence.  Musculoskeletal: Positive for back pain.  Neurological: Positive for weakness.  Endo/Heme/Allergies: Does not bruise/bleed easily.  Psychiatric/Behavioral: Positive for decreased concentration and depression. The patient is nervous/anxious.      Observations/Objective: No SOB or distress noted  Assessment and Plan: Charlotte Perez comes in today with chief complaint of No chief complaint on file.   Diagnosis and orders addressed:  1. Gastroesophageal reflux disease without esophagitis  2. Hyperlipidemia associated with type 2 diabetes mellitus ([place])  3. Type 2 diabetes mellitus with diabetic polyneuropathy, without long-term current use of insulin ([place])  4. Diabetic peripheral neuropathy ([place])  5. Overweight (BMI 25.0-29.9)  6. Moderate single current episode of major depressive disorder ([place])  7. Iron deficiency anemia, unspecified iron deficiency anemia type  8. Chronic midline low back pain without sciatica  9. Constipation, unspecified constipation type  10. Moderate episode of recurrent major depressive disorder ([place])  11. GAD (generalized anxiety disorder)   Labs reviewed, and will drawn new labs in 3 months  Health Maintenance reviewed Diet and exercise encouraged  Follow up plan: 3 months      I discussed the assessment and treatment plan with the patient. The patient was provided an opportunity to ask questions and all were answered. The patient agreed with the plan and demonstrated an understanding of the instructions.   The patient was advised to call back or seek an in-person evaluation if the symptoms worsen  or if the condition fails to improve as anticipated.  The above assessment and management plan was discussed with the patient. The patient verbalized understanding of and has agreed to the management plan. Patient is aware to call the clinic if symptoms persist or worsen. Patient is aware when to return to the clinic for a follow-up visit. Patient educated on when it is appropriate to go to the emergency department.   Time call ended: 8:40 AM    I provided 17 minutes of non-face-to-face time during this encounter.    Evelina Dun, FNP

## 2018-10-09 NOTE — Telephone Encounter (Signed)

## 2018-10-12 ENCOUNTER — Ambulatory Visit: Payer: Medicaid Other | Admitting: Cardiology

## 2018-10-12 ENCOUNTER — Encounter: Payer: Self-pay | Admitting: Cardiology

## 2018-10-12 VITALS — BP 102/70 | HR 80 | Temp 98.0°F | Ht 60.0 in | Wt 138.0 lb

## 2018-10-12 DIAGNOSIS — E782 Mixed hyperlipidemia: Secondary | ICD-10-CM

## 2018-10-12 DIAGNOSIS — I208 Other forms of angina pectoris: Secondary | ICD-10-CM

## 2018-10-12 DIAGNOSIS — Z8249 Family history of ischemic heart disease and other diseases of the circulatory system: Secondary | ICD-10-CM

## 2018-10-12 DIAGNOSIS — Z01812 Encounter for preprocedural laboratory examination: Secondary | ICD-10-CM

## 2018-10-12 DIAGNOSIS — I2089 Other forms of angina pectoris: Secondary | ICD-10-CM

## 2018-10-12 DIAGNOSIS — E1142 Type 2 diabetes mellitus with diabetic polyneuropathy: Secondary | ICD-10-CM

## 2018-10-12 DIAGNOSIS — I10 Essential (primary) hypertension: Secondary | ICD-10-CM

## 2018-10-12 MED ORDER — METOPROLOL TARTRATE 50 MG PO TABS: 50.0000 mg | ORAL_TABLET | Freq: Once | ORAL | 1 refills | Status: DC

## 2018-10-12 MED ORDER — ASPIRIN 81 MG PO TBEC: 81.0000 mg | DELAYED_RELEASE_TABLET | Freq: Every day | ORAL | Status: DC

## 2018-10-12 NOTE — Patient Instructions (Addendum)
Medication Instructions:   Begin Aspirin 81mg  daily (coated).  Continue all other medications.    Labwork:  BMET - order given today.  Please do just prior to CT.  Testing/Procedures:  Cardiac CT - done at [facility].   Office will contact with results via phone or letter.    Follow-Up: Pending test results   Any Other Special Instructions Will Be Listed Below (If Applicable).  If you need a refill on your cardiac medications before your next appointment, please call your pharmacy.  ==================================================  Please arrive at the [facility] main entrance of [facility] at xx:xx AM (30-45 minutes prior to test start time)  [facility], [place] [address] [phone]  Proceed to the [facility] Radiology Department (First Floor).  Please follow these instructions carefully (unless otherwise directed):  Hold all erectile dysfunction medications at least 48 hours prior to test.  On the Night Before the Test: . Be sure to Drink plenty of water. . Do not consume any caffeinated/decaffeinated beverages or chocolate 12 hours prior to your test. . Do not take any antihistamines 12 hours prior to your test. . If you take Metformin do not take 24 hours prior to test. . If the patient has contrast allergy: ? Patient will need a prescription for Prednisone and very clear instructions (as follows): 1. Prednisone 50 mg - take 13 hours prior to test 2. Take another Prednisone 50 mg 7 hours prior to test 3. Take another Prednisone 50 mg 1 hour prior to test 4. Take Benadryl 50 mg 1 hour prior to test . Patient must complete all four doses of above prophylactic medications. . Patient will need a ride after test due to Benadryl.  On the Day of the Test: . Drink plenty of water. Do not drink any water within one hour of the test. . Do not eat any food 4 hours prior to the test. . You may take your regular  medications prior to the test.  . Take metoprolol (Lopressor) two hours prior to test.  (Lopressor 50mg ) . HOLD Furosemide/Hydrochlorothiazide morning of the test.                After the Test: . Drink plenty of water. . After receiving IV contrast, you may experience a mild flushed feeling. This is normal. . On occasion, you may experience a mild rash up to 24 hours after the test. This is not dangerous. If this occurs, you can take Benadryl 25 mg and increase your fluid intake. . If you experience trouble breathing, this can be serious. If it is severe call 911 IMMEDIATELY. If it is mild, please call our office. . If you take any of these medications: Glipizide/Metformin, Avandament, Glucavance, please do not take 48 hours after completing test.

## 2018-10-12 NOTE — Progress Notes (Signed)
Cardiology Office Note  Date: 10/12/2018   ID: Charlotte Perez, Charlotte Perez Nov 22, 1960, MRN [id number]  PCP:  Sharion Balloon, FNP  Consulting Cardiologist: Satira Sark, MD Electrophysiologist:  None   Chief Complaint  Patient presents with  . Chest discomfort    History of Present Illness: Charlotte Perez is a 58 y.o. female referred for cardiology consultation by Ms. Hawks NP for cardiac evaluation in light of reported family history of CAD.  She states that over the last few months she has been experiencing exertional chest tightness and fatigue.  She goes out for a walk in the mornings a few days a week for about 30 minutes, this consistently brings on symptoms.  When she rests and gets cool inside the symptoms go away.  She has also noticed this during the winter months.  She states that her brother and sister both have heart disease, developed in their 34s.  Personal cardiac risk factors include type 2 diabetes mellitus, hyperlipidemia, and hypertension.  She has not been on insulin as yet.  Her most recent LDL was 71 on statin therapy.  I reviewed her most recent ECG as outlined below.  She has not undergone any prior objective ischemic cardiac testing.   Past Medical History:  Diagnosis Date  . Arthritis   . Asthma   . Bipolar disorder ([place])   . Chronic back pain   . Chronic neck pain   . COPD (chronic obstructive pulmonary disease) ([place])   . DDD (degenerative disc disease), cervical   . DDD (degenerative disc disease), lumbar   . Depression   . Diabetes mellitus, type II ([place])   . Gallbladder sludge   . GERD (gastroesophageal reflux disease)   . Hyperlipidemia   . Hypertension   . Lumbar radiculopathy   . Mole (skin)   . Neuropathy of foot   . Renal cyst   . Shingles   . Skin lesions, generalized    1.2 CM FLAT FAWN COLOR AT THE T10 AREA JUST RIGHT OF SPINE     Past Surgical History:  Procedure Laterality Date  . ABDOMINAL HYSTERECTOMY    . BACK  SURGERY    . CESAREAN SECTION     x 2  . NECK SURGERY      Current Outpatient Medications  Medication Sig Dispense Refill  . ACCU-CHEK FASTCLIX LANCETS MISC EVERY DAY 102 each 2  . ACCU-CHEK GUIDE test strip EVERY DAY 100 each 3  . albuterol (PROVENTIL) (2.5 MG/3ML) 0.083% nebulizer solution Take 3 mLs (2.5 mg total) by nebulization every 6 (six) hours as needed for wheezing or shortness of breath. 75 mL 3  . ALPRAZolam (XANAX) 1 MG tablet TAKE ONE TABLET AT BEDTIME AS NEEDED 30 tablet 2  . Ascorbic Acid (VITAMIN C PO) Take by mouth. ImmuBlast    . atorvastatin (LIPITOR) 20 MG tablet TAKE ONE (1) TABLET EACH DAY 90 tablet 1  . JARDIANCE 10 MG TABS tablet TAKE ONE (1) TABLET EACH DAY 90 tablet 0  . lisinopril (ZESTRIL) 5 MG tablet TAKE ONE (1) TABLET EACH DAY 90 tablet 0  . metFORMIN (GLUCOPHAGE) 1000 MG tablet TAKE ONE TABLET TWICE A DAY WITH FOOD 180 tablet 0  . naproxen (NAPROSYN) 500 MG tablet TAKE ONE TABLET TWICE A DAY WITH FOOD 60 tablet 1  . omeprazole (PRILOSEC) 20 MG capsule TAKE ONE (1) CAPSULE EACH DAY 90 capsule 0  . OXYCODONE ER PO Take by mouth.    . pioglitazone (ACTOS)  15 MG tablet TAKE ONE (1) TABLET EACH DAY 90 tablet 0  . venlafaxine XR (EFFEXOR-XR) 150 MG 24 hr capsule Take 2 capsules (300 mg total) by mouth at bedtime. Take 2 in the pm 60 capsule 2  . aspirin 81 MG EC tablet Take 1 tablet (81 mg total) by mouth daily. Swallow whole.    . metoprolol tartrate (LOPRESSOR) 50 MG tablet Take 1 tablet (50 mg total) by mouth once for 1 dose. 1 tablet 1   No current facility-administered medications for this visit.    Allergies:  Cymbalta [duloxetine hcl], Gabapentin, Meloxicam, and Tramadol   Social History: The patient  reports that she is a non-smoker but has been exposed to tobacco smoke. She has never used smokeless tobacco. She reports that she does not drink alcohol or use drugs.   Family History: The patient's family history includes ADD / ADHD in an other family  member; Alcohol abuse in her father and mother; Arthritis in her sister; COPD in her father and mother; Colon cancer in her paternal grandfather; Depression in her daughter; Diabetes in her maternal grandmother and sister; Heart disease in her brother and sister.   ROS:  Please see the history of present illness. Otherwise, complete review of systems is positive for anxiety.  All other systems are reviewed and negative.   Physical Exam: VS:  BP 102/70   Pulse 80   Temp 98 F (36.7 C)   Ht 5' (1.524 m)   Wt 138 lb (62.6 kg)   SpO2 98%   BMI 26.95 kg/m , BMI Body mass index is 26.95 kg/m.  Wt Readings from Last 3 Encounters:  10/12/18 138 lb (62.6 kg)  06/09/18 139 lb 3.2 oz (63.1 kg)  04/30/18 148 lb (67.1 kg)    General: Patient appears comfortable at rest. HEENT: Conjunctiva and lids normal, wearing a mask. Neck: Supple, no elevated JVP or carotid bruits, no thyromegaly. Lungs: Clear to auscultation, nonlabored breathing at rest. Cardiac: Regular rate and rhythm, no S3 or significant systolic murmur, no pericardial rub. Abdomen: Soft, nontender, bowel sounds present. Extremities: Trace ankle edema, distal pulses 2+. Skin: Warm and dry. Musculoskeletal: No kyphosis. Neuropsychiatric: Alert and oriented x3, affect grossly appropriate.  ECG:  An ECG dated 04/30/2018 was personally reviewed today and demonstrated:  Sinus rhythm with lead motion artifact and nonspecific T wave changes.  Recent Labwork: 06/09/2018: ALT 16; AST 16; BUN 12; Creatinine, Ser 0.64; Hemoglobin 12.7; Platelets 236; Potassium 5.2; Sodium 139     Component Value Date/Time   CHOL 159 06/09/2018 0920   CHOL 148 10/07/2012 1237   TRIG 57 06/09/2018 0920   TRIG 119 10/07/2012 1237   HDL 77 06/09/2018 0920   HDL 70 10/07/2012 1237   CHOLHDL 2.1 06/09/2018 0920   LDLCALC 71 06/09/2018 0920   LDLCALC 54 10/07/2012 1237    Other Studies Reviewed Today:  Chest x-ray 04/30/2018: FINDINGS: Cardiomediastinal  silhouette is normal. Mediastinal contours appear intact.  There is no evidence of focal airspace consolidation, pleural effusion or pneumothorax.  Osseous structures are without acute abnormality. Soft tissues are grossly normal.  IMPRESSION: No active cardiopulmonary disease.  Assessment and Plan:  1.  Exertional chest tightness and fatigue in a 58 year old woman with type 2 diabetes mellitus, hypertension, hyperlipidemia, and family history of premature CAD.  ECG from February was overall nonspecific.  She is on statin therapy in addition to ACE inhibitor, I asked her to try to start a low-dose coated aspirin (she states  that she does have history of stomach upset with regular aspirin in the past).  We will arrange a cardiac CT angiogram for further evaluation.  2.  Mixed hyperlipidemia, currently on Lipitor with most recent LDL 71.  3.  Type 2 diabetes mellitus, on Glucophage and Jardiance PCP.  Hemoglobin A1c was 6.8% in March.  4.  History of hypertension, currently on ACE inhibitor with systolic 286.  Medication Adjustments/Labs and Tests Ordered: Current medicines are reviewed at length with the patient today.  Concerns regarding medicines are outlined above.   Tests Ordered: Orders Placed This Encounter  Procedures  . CT CORONARY MORPH W/CTA COR W/SCORE W/CA W/CM &/OR WO/CM  . CT CORONARY FRACTIONAL FLOW RESERVE DATA PREP  . CT CORONARY FRACTIONAL FLOW RESERVE FLUID ANALYSIS  . Basic metabolic panel    Medication Changes: Meds ordered this encounter  Medications  . aspirin 81 MG EC tablet    Sig: Take 1 tablet (81 mg total) by mouth daily. Swallow whole.    New 10/12/2018.  . metoprolol tartrate (LOPRESSOR) 50 MG tablet    Sig: Take 1 tablet (50 mg total) by mouth once for 1 dose.    Dispense:  1 tablet    Refill:  1    Disposition:  Follow up test results.  Signed, Satira Sark, MD, [facility] 10/12/2018 9:17 AM    [facility] at  [place], [place], [place] [address] Phone: [phone]; Fax: [phone]

## 2018-10-18 ENCOUNTER — Other Ambulatory Visit: Payer: Self-pay | Admitting: Family

## 2018-10-18 DIAGNOSIS — E1142 Type 2 diabetes mellitus with diabetic polyneuropathy: Secondary | ICD-10-CM

## 2018-10-19 NOTE — Telephone Encounter (Signed)
Next OV 01/13/19

## 2018-10-20 ENCOUNTER — Ambulatory Visit (HOSPITAL_COMMUNITY)
Admission: RE | Admit: 2018-10-20 | Discharge: 2018-10-20 | Disposition: A | Discharge status: Home / Self Care | Payer: Medicaid Other | Source: Ambulatory Visit | Attending: Family | Admitting: Family

## 2018-10-20 ENCOUNTER — Other Ambulatory Visit: Payer: Self-pay

## 2018-10-20 ENCOUNTER — Ambulatory Visit (HOSPITAL_COMMUNITY): Admission: RE | Admit: 2018-10-20 | Payer: Medicaid Other | Source: Ambulatory Visit

## 2018-10-20 ENCOUNTER — Ambulatory Visit (HOSPITAL_COMMUNITY): Payer: Medicaid Other

## 2018-10-20 DIAGNOSIS — N644 Mastodynia: Secondary | ICD-10-CM | POA: Insufficient documentation

## 2018-10-21 ENCOUNTER — Ambulatory Visit (HOSPITAL_COMMUNITY): Payer: Self-pay

## 2018-10-27 ENCOUNTER — Ambulatory Visit (HOSPITAL_COMMUNITY): Payer: Medicaid Other

## 2018-10-27 ENCOUNTER — Telehealth (HOSPITAL_COMMUNITY): Payer: Self-pay | Admitting: Emergency Medicine

## 2018-10-27 ENCOUNTER — Other Ambulatory Visit: Payer: Self-pay

## 2018-10-27 ENCOUNTER — Ambulatory Visit (HOSPITAL_COMMUNITY)
Admission: RE | Admit: 2018-10-27 | Discharge: 2018-10-27 | Disposition: A | Discharge status: Home / Self Care | Payer: Medicaid Other | Source: Ambulatory Visit | Attending: Family | Admitting: Family

## 2018-10-27 ENCOUNTER — Ambulatory Visit (HOSPITAL_COMMUNITY): Admission: RE | Admit: 2018-10-27 | Payer: Medicaid Other | Source: Ambulatory Visit

## 2018-10-27 ENCOUNTER — Telehealth: Payer: Self-pay | Admitting: Family

## 2018-10-27 ENCOUNTER — Other Ambulatory Visit (HOSPITAL_COMMUNITY): Payer: Medicaid Other

## 2018-10-27 DIAGNOSIS — N644 Mastodynia: Secondary | ICD-10-CM | POA: Diagnosis not present

## 2018-10-27 DIAGNOSIS — R928 Other abnormal and inconclusive findings on diagnostic imaging of breast: Secondary | ICD-10-CM

## 2018-10-27 NOTE — Telephone Encounter (Signed)
Left message on voicemail with name and callback number Latif Nazareno RN Navigator Cardiac Imaging [facility] [phone] Office [phone] Cell  

## 2018-10-28 ENCOUNTER — Ambulatory Visit (HOSPITAL_COMMUNITY): Payer: Medicaid Other

## 2018-10-28 NOTE — Telephone Encounter (Signed)
Referral placed.

## 2018-11-01 ENCOUNTER — Other Ambulatory Visit: Payer: Self-pay | Admitting: Family

## 2018-11-02 ENCOUNTER — Other Ambulatory Visit: Payer: Self-pay

## 2018-11-02 DIAGNOSIS — Z20822 Contact with and (suspected) exposure to covid-19: Secondary | ICD-10-CM

## 2018-11-03 LAB — NOVEL CORONAVIRUS, NAA: SARS-CoV-2, NAA: NOT DETECTED

## 2018-11-04 ENCOUNTER — Other Ambulatory Visit: Payer: Self-pay

## 2018-11-05 ENCOUNTER — Ambulatory Visit: Payer: Medicaid Other | Admitting: Nurse Practitioner

## 2018-11-05 ENCOUNTER — Encounter: Payer: Self-pay | Admitting: Nurse Practitioner

## 2018-11-05 ENCOUNTER — Ambulatory Visit (INDEPENDENT_AMBULATORY_CARE_PROVIDER_SITE_OTHER): Payer: Medicaid Other

## 2018-11-05 VITALS — BP 104/70 | HR 76 | Temp 96.9°F | Ht 60.0 in | Wt 137.0 lb

## 2018-11-05 DIAGNOSIS — M25531 Pain in right wrist: Secondary | ICD-10-CM | POA: Diagnosis not present

## 2018-11-05 DIAGNOSIS — M5442 Lumbago with sciatica, left side: Secondary | ICD-10-CM | POA: Diagnosis not present

## 2018-11-05 DIAGNOSIS — S63501A Unspecified sprain of right wrist, initial encounter: Secondary | ICD-10-CM | POA: Diagnosis not present

## 2018-11-05 DIAGNOSIS — G8929 Other chronic pain: Secondary | ICD-10-CM

## 2018-11-05 DIAGNOSIS — M5441 Lumbago with sciatica, right side: Secondary | ICD-10-CM

## 2018-11-05 MED ORDER — NAPROXEN 500 MG PO TABS: ORAL_TABLET | ORAL | 1 refills | Status: DC

## 2018-11-05 NOTE — Patient Instructions (Signed)
Wrist Pain, Adult There are many things that can cause wrist pain. Some common causes include:  An injury to the wrist area.  Overuse of the joint.  A condition that causes too much pressure to be put on a nerve in the wrist (carpal tunnel syndrome).  Wear and tear of the joints that happens as a person gets older (osteoarthritis).  Other types of arthritis. Sometimes, the cause of wrist pain is not known. Often, the pain goes away when you follow your doctor's instructions for helping pain at home, such as resting or icing your wrist. If your wrist pain does not go away, it is important to tell your doctor. Follow these instructions at home:  Rest the wrist area for 48 hours or more, or as long as told by your doctor.  If a splint or elastic bandage has been put on your wrist, use it as told by your doctor. ? Take off the splint or bandage only as told by your doctor. ? Loosen the splint or bandage if your fingers tingle, lose feeling (get numb), or turn cold or blue.  If directed, apply ice to the injured area: ? If you have a removable splint or elastic bandage, remove it as told by your doctor. ? Put ice in a plastic bag. ? Place a towel between your skin and the bag or between your splint or bandage and the bag. ? Leave the ice on for 20 minutes, 2-3 times a day.   Keep your arm raised (elevated) above the level of your heart while you are sitting or lying down.  Take over-the-counter and prescription medicines only as told by your doctor.  Keep all follow-up visits as told by your doctor. This is important. Contact a doctor if:  You have a sudden sharp pain in the wrist, hand, or arm that is different or new.  The swelling or bruising on your wrist or hand gets worse.  Your skin becomes red, gets a rash, or has open sores.  Your pain does not get better or it gets worse. Get help right away if:  You lose feeling in your fingers or hand.  Your fingers turn white,  very red, or cold and blue.  You cannot move your fingers.  You have a fever or chills. This information is not intended to replace advice given to you by your health care provider. Make sure you discuss any questions you have with your health care provider. Document Released: 08/28/2007 Document Revised: 02/21/2017 Document Reviewed: 09/28/2015 Elsevier Patient Education  2020 [employer].

## 2018-11-05 NOTE — Progress Notes (Signed)
   Subjective:    Patient ID: Charlotte Perez, female    DOB: June 12, 1960, 58 y.o.   MRN: [id number]   Chief Complaint: Wrist Pain (Right)   HPI Patient come sin today c/o right wrist pain. She says it is a stinging burning pain in right wrist and she is not able to turn her hand over. This started about the middle of June. She thought was a sprain, but pain has worsened. She has been using muscle rub, moist heat and OTC pain patches and nothing has helped. She rates pain 9.5/10 today. Holding it still helps with pain.    Review of Systems  Constitutional: Negative for activity change and appetite change.  HENT: Negative.   Eyes: Negative for pain.  Respiratory: Negative for shortness of breath.   Cardiovascular: Negative for chest pain, palpitations and leg swelling.  Gastrointestinal: Negative for abdominal pain.  Endocrine: Negative for polydipsia.  Genitourinary: Negative.   Skin: Negative for rash.  Neurological: Negative for dizziness, weakness and headaches.  Hematological: Does not bruise/bleed easily.  Psychiatric/Behavioral: Negative.   All other systems reviewed and are negative.      Objective:   Physical Exam Vitals signs and nursing note reviewed.  Constitutional:      General: She is in acute distress (moderate).     Appearance: Normal appearance.  Cardiovascular:     Rate and Rhythm: Normal rate and regular rhythm.     Heart sounds: Normal heart sounds.  Pulmonary:     Breath sounds: Normal breath sounds.  Musculoskeletal: Normal range of motion.     Comments: Decrease ROM of right wrist, with pain on flexion, lateral movement and unable to fully pronate. Right grip weaker then left  Neurological:     Mental Status: She is alert.    BP 104/70   Pulse 76   Temp (!) 96.9 F (36.1 C) (Oral)   Ht 5' (1.524 m)   Wt 137 lb (62.1 kg)   BMI 26.76 kg/m   Right wrist xray- no fracture-Preliminary reading by Ronnald Collum, FNP  [facility]       Assessment &  Plan:  [name] in today with chief complaint of Wrist Pain (Right)   1. Right wrist pain - DG Wrist Complete Right; Future  2. Sprain of right wrist, initial encounter Wrist brace Ice BID Rest Wear wrist brace all the time Elevate if swelling Meds ordered this encounter  Medications  . naproxen (NAPROSYN) 500 MG tablet    Sig: TAKE ONE TABLET TWICE A DAY WITH FOOD    Dispense:  60 tablet    Refill:  1    Order Specific Question:   Supervising Provider    Answer:   Worthy Rancher [[id number]]   [place], FNP

## 2018-11-10 ENCOUNTER — Ambulatory Visit (HOSPITAL_COMMUNITY)
Admission: RE | Admit: 2018-11-10 | Discharge: 2018-11-10 | Disposition: A | Discharge status: Home / Self Care | Payer: Medicaid Other | Source: Ambulatory Visit | Attending: Family | Admitting: Family

## 2018-11-10 ENCOUNTER — Other Ambulatory Visit: Payer: Self-pay

## 2018-11-10 DIAGNOSIS — R928 Other abnormal and inconclusive findings on diagnostic imaging of breast: Secondary | ICD-10-CM | POA: Insufficient documentation

## 2018-11-17 ENCOUNTER — Other Ambulatory Visit: Payer: Self-pay | Admitting: *Deleted

## 2018-11-17 DIAGNOSIS — R071 Chest pain on breathing: Secondary | ICD-10-CM

## 2018-11-17 DIAGNOSIS — R079 Chest pain, unspecified: Secondary | ICD-10-CM

## 2018-11-17 DIAGNOSIS — I208 Other forms of angina pectoris: Secondary | ICD-10-CM

## 2018-11-17 DIAGNOSIS — Z8249 Family history of ischemic heart disease and other diseases of the circulatory system: Secondary | ICD-10-CM

## 2018-11-18 ENCOUNTER — Other Ambulatory Visit (HOSPITAL_COMMUNITY): Payer: Medicaid Other

## 2018-11-23 ENCOUNTER — Other Ambulatory Visit: Payer: Self-pay | Admitting: Family

## 2018-11-23 DIAGNOSIS — E1165 Type 2 diabetes mellitus with hyperglycemia: Secondary | ICD-10-CM

## 2018-12-04 ENCOUNTER — Telehealth (HOSPITAL_COMMUNITY): Payer: Self-pay | Admitting: Emergency Medicine

## 2018-12-04 NOTE — Telephone Encounter (Signed)
Left message on voicemail with name and callback number Marchia Bond RN Navigator Cardiac Imaging [facility] [phone] Office [phone] Cell  Requested patient have BMP drawn (VM)

## 2018-12-07 ENCOUNTER — Other Ambulatory Visit: Payer: Self-pay | Admitting: *Deleted

## 2018-12-07 ENCOUNTER — Telehealth (HOSPITAL_COMMUNITY): Payer: Self-pay | Admitting: Emergency Medicine

## 2018-12-07 ENCOUNTER — Other Ambulatory Visit: Payer: Medicaid Other

## 2018-12-07 ENCOUNTER — Other Ambulatory Visit: Payer: Self-pay

## 2018-12-07 DIAGNOSIS — I208 Other forms of angina pectoris: Secondary | ICD-10-CM

## 2018-12-07 DIAGNOSIS — Z01812 Encounter for preprocedural laboratory examination: Secondary | ICD-10-CM

## 2018-12-07 DIAGNOSIS — Z8249 Family history of ischemic heart disease and other diseases of the circulatory system: Secondary | ICD-10-CM

## 2018-12-07 NOTE — Telephone Encounter (Signed)
Returning phone call regarding upcoming cardiac imaging study; pt verbalizes understanding of appt date/time, parking situation and where to check in, pre-test NPO status and medications ordered, and verified current allergies; name and call back number provided for further questions should they arise Marchia Bond RN [facility] [phone] office [phone] cell  Pt denies covid symptoms, verbalized understanding of visitor policy.

## 2018-12-08 ENCOUNTER — Ambulatory Visit (HOSPITAL_COMMUNITY)
Admission: RE | Admit: 2018-12-08 | Discharge: 2018-12-08 | Disposition: A | Discharge status: Home / Self Care | Payer: Medicaid Other | Source: Ambulatory Visit | Attending: Cardiology | Admitting: Cardiology

## 2018-12-08 DIAGNOSIS — R071 Chest pain on breathing: Secondary | ICD-10-CM | POA: Diagnosis not present

## 2018-12-08 DIAGNOSIS — R079 Chest pain, unspecified: Secondary | ICD-10-CM | POA: Insufficient documentation

## 2018-12-08 DIAGNOSIS — Z8249 Family history of ischemic heart disease and other diseases of the circulatory system: Secondary | ICD-10-CM | POA: Insufficient documentation

## 2018-12-08 DIAGNOSIS — I208 Other forms of angina pectoris: Secondary | ICD-10-CM | POA: Diagnosis not present

## 2018-12-08 LAB — BASIC METABOLIC PANEL
BUN/Creatinine Ratio: 29 — ABNORMAL HIGH (ref 9–23)
BUN: 20 mg/dL (ref 6–24)
CO2: 25 mmol/L (ref 20–29)
Calcium: 9 mg/dL (ref 8.7–10.2)
Chloride: 104 mmol/L (ref 96–106)
Creatinine, Ser: 0.68 mg/dL (ref 0.57–1.00)
GFR calc Af Amer: 112 mL/min/{1.73_m2} (ref >59)
GFR calc non Af Amer: 97 mL/min/{1.73_m2} (ref >59)
Glucose: 233 mg/dL — ABNORMAL HIGH (ref 65–99)
Potassium: 4.4 mmol/L (ref 3.5–5.2)
Sodium: 140 mmol/L (ref 134–144)

## 2018-12-08 MED ORDER — IOHEXOL 350 MG/ML SOLN
80.0000 mL | Freq: Once | INTRAVENOUS | Status: AC | PRN
  Administered 2018-12-08: 100 mL via INTRAVENOUS

## 2018-12-08 MED ORDER — NITROGLYCERIN 0.4 MG SL SUBL
SUBLINGUAL_TABLET | SUBLINGUAL | Status: AC
  Filled 2018-12-08: qty 2

## 2018-12-08 MED ORDER — NITROGLYCERIN 0.4 MG SL SUBL
0.8000 mg | SUBLINGUAL_TABLET | Freq: Once | SUBLINGUAL | Status: AC
  Administered 2018-12-08: 12:00:00 0.8 mg via SUBLINGUAL
  Filled 2018-12-08: qty 25

## 2018-12-10 ENCOUNTER — Telehealth: Payer: Self-pay | Admitting: *Deleted

## 2018-12-10 NOTE — Telephone Encounter (Signed)
-----   Message from Satira Sark, MD sent at 12/10/2018  8:06 AM EDT ----- Results reviewed.  Please let her know that the cardiac CT looked very good, no evidence of CAD with calcium score of 0 suggesting low risk of cardiac events.  I would not anticipate further cardiac testing at this time.  She should continue with risk factor modification strategies per PCP and I would suggest that she get a follow-up noncontrasted chest CT in 1 year with her PCP for follow-up on the small pulmonary nodules - likely benign as mentioned previously.

## 2018-12-10 NOTE — Telephone Encounter (Signed)
-----   Message from Satira Sark, MD sent at 12/08/2018 12:51 PM EDT ----- Results reviewed.  This is the radiology overread of the study, cardiac results are pending.  Incidentally noted were multiple small pulmonary nodules that are likely benign (possibly old granulomas or scars).  This would not be expected to be causing her any symptoms.  We can always reimage the chest in 1 year to make sure that this is not changing.  Await cardiac findings.

## 2018-12-10 NOTE — Telephone Encounter (Signed)
-----   Message from Satira Sark, MD sent at 12/08/2018 10:19 AM EDT ----- Results reviewed.  Obtained prior to cardiac CT.  Renal function and potassium are normal.  Glucose elevated.  Forward results to PCP as well.

## 2018-12-10 NOTE — Telephone Encounter (Signed)
Patient informed and verbalized understanding. Copy sent to PCP 

## 2018-12-18 ENCOUNTER — Other Ambulatory Visit: Payer: Self-pay

## 2018-12-18 ENCOUNTER — Ambulatory Visit (INDEPENDENT_AMBULATORY_CARE_PROVIDER_SITE_OTHER): Payer: Medicaid Other | Admitting: Psychiatry

## 2018-12-18 ENCOUNTER — Encounter (HOSPITAL_COMMUNITY): Payer: Self-pay | Admitting: Psychiatry

## 2018-12-18 DIAGNOSIS — F321 Major depressive disorder, single episode, moderate: Secondary | ICD-10-CM | POA: Diagnosis not present

## 2018-12-18 MED ORDER — VENLAFAXINE HCL ER 150 MG PO CP24: 300.0000 mg | ORAL_CAPSULE | Freq: Every day | ORAL | 2 refills | Status: DC

## 2018-12-18 MED ORDER — ALPRAZOLAM 1 MG PO TABS: ORAL_TABLET | ORAL | 2 refills | Status: DC
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
## 2018-12-25 ENCOUNTER — Other Ambulatory Visit: Payer: Self-pay

## 2018-12-25 DIAGNOSIS — Z20822 Contact with and (suspected) exposure to covid-19: Secondary | ICD-10-CM

## 2018-12-26 LAB — NOVEL CORONAVIRUS, NAA: SARS-CoV-2, NAA: NOT DETECTED

## 2019-01-12 ENCOUNTER — Other Ambulatory Visit: Payer: Self-pay | Admitting: Nurse Practitioner

## 2019-01-12 ENCOUNTER — Other Ambulatory Visit: Payer: Self-pay | Admitting: Family

## 2019-01-12 DIAGNOSIS — G8929 Other chronic pain: Secondary | ICD-10-CM

## 2019-01-12 DIAGNOSIS — M5441 Lumbago with sciatica, right side: Secondary | ICD-10-CM

## 2019-01-12 DIAGNOSIS — E7800 Pure hypercholesterolemia, unspecified: Secondary | ICD-10-CM | POA: Diagnosis not present

## 2019-01-12 DIAGNOSIS — J45909 Unspecified asthma, uncomplicated: Secondary | ICD-10-CM | POA: Diagnosis not present

## 2019-01-12 DIAGNOSIS — F419 Anxiety disorder, unspecified: Secondary | ICD-10-CM | POA: Diagnosis not present

## 2019-01-12 DIAGNOSIS — E119 Type 2 diabetes mellitus without complications: Secondary | ICD-10-CM | POA: Diagnosis not present

## 2019-01-12 DIAGNOSIS — Z79899 Other long term (current) drug therapy: Secondary | ICD-10-CM | POA: Diagnosis not present

## 2019-01-12 DIAGNOSIS — G43909 Migraine, unspecified, not intractable, without status migrainosus: Secondary | ICD-10-CM | POA: Diagnosis not present

## 2019-01-12 DIAGNOSIS — E1142 Type 2 diabetes mellitus with diabetic polyneuropathy: Secondary | ICD-10-CM

## 2019-01-12 DIAGNOSIS — F329 Major depressive disorder, single episode, unspecified: Secondary | ICD-10-CM | POA: Diagnosis not present

## 2019-01-12 DIAGNOSIS — M5442 Lumbago with sciatica, left side: Secondary | ICD-10-CM

## 2019-01-13 ENCOUNTER — Ambulatory Visit: Payer: Self-pay | Admitting: Family

## 2019-01-13 ENCOUNTER — Other Ambulatory Visit: Payer: Self-pay

## 2019-01-14 ENCOUNTER — Encounter: Payer: Self-pay | Admitting: Family

## 2019-01-14 ENCOUNTER — Ambulatory Visit (INDEPENDENT_AMBULATORY_CARE_PROVIDER_SITE_OTHER): Payer: Medicaid Other | Admitting: Family

## 2019-01-14 VITALS — BP 96/65 | HR 81 | Temp 96.4°F | Ht 60.0 in | Wt 140.8 lb

## 2019-01-14 DIAGNOSIS — D509 Iron deficiency anemia, unspecified: Secondary | ICD-10-CM

## 2019-01-14 DIAGNOSIS — E663 Overweight: Secondary | ICD-10-CM | POA: Diagnosis not present

## 2019-01-14 DIAGNOSIS — Z23 Encounter for immunization: Secondary | ICD-10-CM

## 2019-01-14 DIAGNOSIS — K219 Gastro-esophageal reflux disease without esophagitis: Secondary | ICD-10-CM

## 2019-01-14 DIAGNOSIS — E785 Hyperlipidemia, unspecified: Secondary | ICD-10-CM

## 2019-01-14 DIAGNOSIS — G8929 Other chronic pain: Secondary | ICD-10-CM

## 2019-01-14 DIAGNOSIS — E1169 Type 2 diabetes mellitus with other specified complication: Secondary | ICD-10-CM | POA: Diagnosis not present

## 2019-01-14 DIAGNOSIS — E1142 Type 2 diabetes mellitus with diabetic polyneuropathy: Secondary | ICD-10-CM | POA: Diagnosis not present

## 2019-01-14 DIAGNOSIS — F321 Major depressive disorder, single episode, moderate: Secondary | ICD-10-CM

## 2019-01-14 DIAGNOSIS — K5900 Constipation, unspecified: Secondary | ICD-10-CM

## 2019-01-14 DIAGNOSIS — M545 Low back pain, unspecified: Secondary | ICD-10-CM

## 2019-01-14 DIAGNOSIS — F331 Major depressive disorder, recurrent, moderate: Secondary | ICD-10-CM

## 2019-01-14 DIAGNOSIS — F411 Generalized anxiety disorder: Secondary | ICD-10-CM

## 2019-01-14 LAB — CBC WITH DIFFERENTIAL/PLATELET
Basophils Absolute: 0.1 10*3/uL (ref 0.0–0.2)
Basos: 1 %
EOS (ABSOLUTE): 0.1 10*3/uL (ref 0.0–0.4)
Eos: 2 %
Hematocrit: 39.2 % (ref 34.0–46.6)
Hemoglobin: 12.9 g/dL (ref 11.1–15.9)
Immature Grans (Abs): 0 10*3/uL (ref 0.0–0.1)
Immature Granulocytes: 0 %
Lymphocytes Absolute: 2.8 10*3/uL (ref 0.7–3.1)
Lymphs: 46 %
MCH: 29.3 pg (ref 26.6–33.0)
MCHC: 32.9 g/dL (ref 31.5–35.7)
MCV: 89 fL (ref 79–97)
Monocytes Absolute: 0.6 10*3/uL (ref 0.1–0.9)
Monocytes: 9 %
Neutrophils Absolute: 2.6 10*3/uL (ref 1.4–7.0)
Neutrophils: 42 %
Platelets: 248 10*3/uL (ref 150–450)
RBC: 4.4 x10E6/uL (ref 3.77–5.28)
RDW: 13 % (ref 11.7–15.4)
WBC: 6.1 10*3/uL (ref 3.4–10.8)

## 2019-01-14 LAB — CMP14+EGFR
ALT: 10 IU/L (ref 0–32)
AST: 12 IU/L (ref 0–40)
Albumin/Globulin Ratio: 1.8 (ref 1.2–2.2)
Albumin: 4.2 g/dL (ref 3.8–4.9)
Alkaline Phosphatase: 66 IU/L (ref 39–117)
BUN/Creatinine Ratio: 28 — ABNORMAL HIGH (ref 9–23)
BUN: 22 mg/dL (ref 6–24)
Bilirubin Total: <0.2 mg/dL (ref 0.0–1.2)
CO2: 25 mmol/L (ref 20–29)
Calcium: 8.9 mg/dL (ref 8.7–10.2)
Chloride: 101 mmol/L (ref 96–106)
Creatinine, Ser: 0.8 mg/dL (ref 0.57–1.00)
GFR calc Af Amer: 93 mL/min/{1.73_m2} (ref >59)
GFR calc non Af Amer: 81 mL/min/{1.73_m2} (ref >59)
Globulin, Total: 2.3 g/dL (ref 1.5–4.5)
Glucose: 97 mg/dL (ref 65–99)
Potassium: 4.2 mmol/L (ref 3.5–5.2)
Sodium: 142 mmol/L (ref 134–144)
Total Protein: 6.5 g/dL (ref 6.0–8.5)

## 2019-01-14 LAB — BAYER DCA HB A1C WAIVED: HB A1C (BAYER DCA - WAIVED): 6.8 % (ref <7.0)

## 2019-01-14 NOTE — Patient Instructions (Signed)
Hypotension As your heart beats, it forces blood through your body. Hypotension, commonly called low blood pressure, is when the force of blood pumping through your arteries is too weak. Arteries are blood vessels that carry blood from the heart throughout the body. Depending on the cause and severity, hypotension may be harmless (benign) or may cause serious problems (be critical). When blood pressure is too low, you may not get enough blood to your brain or to the rest of your organs. This can cause weakness, light-headedness, rapid heartbeat, and fainting. What are the causes? This condition may be caused by:  Blood loss.  Loss of body fluids (dehydration).  Heart problems.  Hormone (endocrine) problems.  Pregnancy.  Severe infection.  Lack of certain nutrients.  Severe allergic reactions (anaphylaxis).  Certain medicines, such as blood pressure medicine or medicines that make the body lose excess fluids (diuretics). Sometimes, hypotension may be caused by not taking medicine as directed, such as taking too much of a certain medicine. What increases the risk? The following factors may make you more likely to develop this condition:  Age. Risk increases as you get older.  Conditions that affect the heart or the central nervous system.  Taking certain medicines, such as blood pressure medicine or diuretics.  Being pregnant. What are the signs or symptoms? Common symptoms of this condition include:  Weakness.  Light-headedness.  Dizziness.  Blurred vision.  Fatigue.  Rapid heartbeat.  Fainting, in severe cases. How is this diagnosed? This condition is diagnosed based on:  Your medical history.  Your symptoms.  Your blood pressure measurement. Your health care provider will check your blood pressure when you are: ? Lying down. ? Sitting. ? Standing. A blood pressure reading is recorded as two numbers, such as "120 over 80" (or 120/80). The first ("top")  number is called the systolic pressure. It is a measure of the pressure in your arteries as your heart beats. The second ("bottom") number is called the diastolic pressure. It is a measure of the pressure in your arteries when your heart relaxes between beats. Blood pressure is measured in a unit called mm Hg. Healthy blood pressure for most adults is 120/80. If your blood pressure is below 90/60, you may be diagnosed with hypotension. Other information or tests that may be used to diagnose hypotension include:  Your other vital signs, such as your heart rate and temperature.  Blood tests.  Tilt table test. For this test, you will be safely secured to a table that moves you from a lying position to an upright position. Your heart rhythm and blood pressure will be monitored during the test. How is this treated? Treatment for this condition may include:  Changing your diet. This may involve eating more salt (sodium) or drinking more water.  Taking medicines to raise your blood pressure.  Changing the dosage of certain medicines you are taking that might be lowering your blood pressure.  Wearing compression stockings. These stockings help to prevent blood clots and reduce swelling in your legs. In some cases, you may need to go to the hospital for:  Fluid replacement. This means you will receive fluids through an IV.  Blood replacement. This means you will receive donated blood through an IV (transfusion).  Treating an infection or heart problems, if this applies.  Monitoring. You may need to be monitored while medicines that you are taking wear off. Follow these instructions at home: Eating and drinking   Drink enough fluid to keep your   urine pale yellow.  Eat a healthy diet, and follow instructions from your health care provider about eating or drinking restrictions. A healthy diet includes: ? Fresh fruits and vegetables. ? Whole grains. ? Lean meats. ? Low-fat dairy products.   Eat extra salt only as directed. Do not add extra salt to your diet unless your health care provider told you to do that.  Eat frequent, small meals.  Avoid standing up suddenly after eating. Medicines  Take over-the-counter and prescription medicines only as told by your health care provider. ? Follow instructions from your health care provider about changing the dosage of your current medicines, if this applies. ? Do not stop or adjust any of your medicines on your own. General instructions   Wear compression stockings as told by your health care provider.  Get up slowly from lying down or sitting positions. This gives your blood pressure a chance to adjust.  Avoid hot showers and excessive heat as directed by your health care provider.  Return to your normal activities as told by your health care provider. Ask your health care provider what activities are safe for you.  Do not use any products that contain nicotine or tobacco, such as cigarettes, e-cigarettes, and chewing tobacco. If you need help quitting, ask your health care provider.  Keep all follow-up visits as told by your health care provider. This is important. Contact a health care provider if you:  Vomit.  Have diarrhea.  Have a fever for more than 2-3 days.  Feel more thirsty than usual.  Feel weak and tired. Get help right away if you:  Have chest pain.  Have a fast or irregular heartbeat.  Develop numbness in any part of your body.  Cannot move your arms or your legs.  Have trouble speaking.  Become sweaty or feel light-headed.  Faint.  Feel short of breath.  Have trouble staying awake.  Feel confused. Summary  Hypotension is when the force of blood pumping through your arteries is too weak.  Hypotension may be harmless (benign) or may cause serious problems (be critical).  Treatment for this condition may include changing your diet, changing your medicines, and wearing compression  stockings.  In some cases, you may need to go to the hospital for fluid or blood replacement. This information is not intended to replace advice given to you by your health care provider. Make sure you discuss any questions you have with your health care provider. Document Released: 03/11/2005 Document Revised: 09/04/2017 Document Reviewed: 09/04/2017 Elsevier Patient Education  2020 Elsevier Inc.   

## 2019-01-14 NOTE — Progress Notes (Signed)
Subjective:    Patient ID: Charlotte Perez, female    DOB: 05/10/1960, 58 y.o.   MRN: [id number]  No chief complaint on file.  PT presents to the office today for chronic follow up. Pt is followed by Behavioral health for ADHD, GAD, and Depressionevery 3 months.Pt is followed by Pain Clinic every month for chronic back pain. All these are stable and doing "well".  Followed by [facility] every 3 months for cataracts and glaucoma.  Diabetes She presents for her follow-up diabetic visit. She has type 2 diabetes mellitus. Her disease course has been stable. Associated symptoms include blurred vision, foot paresthesias and visual change. Symptoms are stable. Diabetic complications include peripheral neuropathy. Risk factors for coronary artery disease include dyslipidemia, diabetes mellitus, hypertension, sedentary lifestyle and post-menopausal. Her overall blood glucose range is 110-130 mg/dl. An ACE inhibitor/angiotensin II receptor blocker is being taken. Eye exam is current.  Gastroesophageal Reflux She complains of heartburn. She reports no belching or no coughing. This is a chronic problem. The current episode started more than 1 year ago. The problem occurs rarely. The problem has been unchanged. The heartburn is of mild intensity. The symptoms are aggravated by certain foods. She has tried a PPI for the symptoms.  Hyperlipidemia This is a chronic problem. The current episode started more than 1 year ago. The problem is controlled. Recent lipid tests were reviewed and are normal. Current antihyperlipidemic treatment includes statins. The current treatment provides moderate improvement of lipids. Risk factors for coronary artery disease include diabetes mellitus, dyslipidemia and post-menopausal.  Depression        This is a chronic problem.  The current episode started more than 1 year ago.   The onset quality is gradual.   The problem occurs intermittently.  Associated symptoms  include decreased concentration, irritable, restlessness and sad.  Associated symptoms include no hopelessness and no decreased interest.  Past medical history includes anxiety.   Anxiety Presents for follow-up visit. Symptoms include decreased concentration, depressed mood, excessive worry, irritability and restlessness. Symptoms occur most days.   Her past medical history is significant for anemia.  Anemia Presents for follow-up visit. There has been no bruising/bleeding easily.  Constipation This is a chronic problem. The current episode started more than 1 year ago. The problem has been waxing and waning since onset. She has tried fiber and laxatives for the symptoms. The treatment provided mild relief.      Review of Systems  Constitutional: Positive for irritability.  Eyes: Positive for blurred vision.  Respiratory: Negative for cough.   Gastrointestinal: Positive for constipation and heartburn.  Hematological: Does not bruise/bleed easily.  Psychiatric/Behavioral: Positive for decreased concentration and depression.  All other systems reviewed and are negative.      Objective:   Physical Exam Vitals signs reviewed.  Constitutional:      General: She is irritable. She is not in acute distress.    Appearance: She is well-developed.  HENT:     Head: Normocephalic and atraumatic.     Right Ear: Tympanic membrane normal.     Left Ear: Tympanic membrane normal.  Eyes:     Pupils: Pupils are equal, round, and reactive to light.  Neck:     Musculoskeletal: Normal range of motion and neck supple.     Thyroid: No thyromegaly.  Cardiovascular:     Rate and Rhythm: Normal rate and regular rhythm.     Heart sounds: Normal heart sounds. No murmur.  Pulmonary:  Effort: Pulmonary effort is normal. No respiratory distress.     Breath sounds: Normal breath sounds. No wheezing.  Abdominal:     General: Bowel sounds are normal. There is no distension.     Palpations: Abdomen is  soft.     Tenderness: There is no abdominal tenderness.  Musculoskeletal: Normal range of motion.        General: No tenderness.  Skin:    General: Skin is warm and dry.  Neurological:     Mental Status: She is alert and oriented to person, place, and time.     Cranial Nerves: No cranial nerve deficit.     Deep Tendon Reflexes: Reflexes are normal and symmetric.  Psychiatric:        Behavior: Behavior normal.        Thought Content: Thought content normal.        Judgment: Judgment normal.       BP 96/65   Pulse 81   Temp (!) 96.4 F (35.8 C) (Temporal)   Ht 5' (1.524 m)   Wt 140 lb 12.8 oz (63.9 kg)   SpO2 100%   BMI 27.50 kg/m      Assessment & Plan:  Charlotte Perez comes in today with chief complaint of Medical Management of Chronic Issues   Diagnosis and orders addressed:  1. Gastroesophageal reflux disease without esophagitis - CMP14+EGFR - CBC with Differential/Platelet  2. Hyperlipidemia associated with type 2 diabetes mellitus (HCC) - CMP14+EGFR - CBC with Differential/Platelet  3. Type 2 diabetes mellitus with diabetic polyneuropathy, without long-term current use of insulin (HCC) - Bayer DCA Hb A1c Waived - CMP14+EGFR - CBC with Differential/Platelet  4. Diabetic peripheral neuropathy (HCC) - CMP14+EGFR - CBC with Differential/Platelet  5. Overweight (BMI 25.0-29.9) - CMP14+EGFR - CBC with Differential/Platelet  6. Iron deficiency anemia, unspecified iron deficiency anemia type - CMP14+EGFR - CBC with Differential/Platelet  7. GAD (generalized anxiety disorder) - CMP14+EGFR - CBC with Differential/Platelet  8. Moderate episode of recurrent major depressive disorder (HCC) - CMP14+EGFR - CBC with Differential/Platelet  9. Constipation, unspecified constipation type - CMP14+EGFR - CBC with Differential/Platelet  10. Chronic midline low back pain without sciatica - CMP14+EGFR - CBC with Differential/Platelet  11. Moderate single  current episode of major depressive disorder ([place])   Labs pending Health Maintenance reviewed Diet and exercise encouraged  Follow up plan: 2 weeks to recheck hypotension   Evelina Dun, FNP

## 2019-01-25 ENCOUNTER — Other Ambulatory Visit: Payer: Self-pay | Admitting: Family

## 2019-01-29 ENCOUNTER — Other Ambulatory Visit: Payer: Self-pay

## 2019-02-01 ENCOUNTER — Encounter: Payer: Self-pay | Admitting: Family

## 2019-02-01 ENCOUNTER — Ambulatory Visit: Payer: Medicaid Other | Admitting: Family

## 2019-02-01 ENCOUNTER — Other Ambulatory Visit: Payer: Self-pay

## 2019-02-01 VITALS — BP 102/68 | HR 86 | Temp 97.5°F | Ht 60.0 in | Wt 140.6 lb

## 2019-02-01 DIAGNOSIS — R531 Weakness: Secondary | ICD-10-CM

## 2019-02-01 DIAGNOSIS — R42 Dizziness and giddiness: Secondary | ICD-10-CM

## 2019-02-01 DIAGNOSIS — I959 Hypotension, unspecified: Secondary | ICD-10-CM

## 2019-02-01 NOTE — Patient Instructions (Signed)
Hypotension As your heart beats, it forces blood through your body. Hypotension, commonly called low blood pressure, is when the force of blood pumping through your arteries is too weak. Arteries are blood vessels that carry blood from the heart throughout the body. Depending on the cause and severity, hypotension may be harmless (benign) or may cause serious problems (be critical). When blood pressure is too low, you may not get enough blood to your brain or to the rest of your organs. This can cause weakness, light-headedness, rapid heartbeat, and fainting. What are the causes? This condition may be caused by:  Blood loss.  Loss of body fluids (dehydration).  Heart problems.  Hormone (endocrine) problems.  Pregnancy.  Severe infection.  Lack of certain nutrients.  Severe allergic reactions (anaphylaxis).  Certain medicines, such as blood pressure medicine or medicines that make the body lose excess fluids (diuretics). Sometimes, hypotension may be caused by not taking medicine as directed, such as taking too much of a certain medicine. What increases the risk? The following factors may make you more likely to develop this condition:  Age. Risk increases as you get older.  Conditions that affect the heart or the central nervous system.  Taking certain medicines, such as blood pressure medicine or diuretics.  Being pregnant. What are the signs or symptoms? Common symptoms of this condition include:  Weakness.  Light-headedness.  Dizziness.  Blurred vision.  Fatigue.  Rapid heartbeat.  Fainting, in severe cases. How is this diagnosed? This condition is diagnosed based on:  Your medical history.  Your symptoms.  Your blood pressure measurement. Your health care provider will check your blood pressure when you are: ? Lying down. ? Sitting. ? Standing. A blood pressure reading is recorded as two numbers, such as "120 over 80" (or 120/80). The first ("top")  number is called the systolic pressure. It is a measure of the pressure in your arteries as your heart beats. The second ("bottom") number is called the diastolic pressure. It is a measure of the pressure in your arteries when your heart relaxes between beats. Blood pressure is measured in a unit called mm Hg. Healthy blood pressure for most adults is 120/80. If your blood pressure is below 90/60, you may be diagnosed with hypotension. Other information or tests that may be used to diagnose hypotension include:  Your other vital signs, such as your heart rate and temperature.  Blood tests.  Tilt table test. For this test, you will be safely secured to a table that moves you from a lying position to an upright position. Your heart rhythm and blood pressure will be monitored during the test. How is this treated? Treatment for this condition may include:  Changing your diet. This may involve eating more salt (sodium) or drinking more water.  Taking medicines to raise your blood pressure.  Changing the dosage of certain medicines you are taking that might be lowering your blood pressure.  Wearing compression stockings. These stockings help to prevent blood clots and reduce swelling in your legs. In some cases, you may need to go to the hospital for:  Fluid replacement. This means you will receive fluids through an IV.  Blood replacement. This means you will receive donated blood through an IV (transfusion).  Treating an infection or heart problems, if this applies.  Monitoring. You may need to be monitored while medicines that you are taking wear off. Follow these instructions at home: Eating and drinking   Drink enough fluid to keep your   urine pale yellow.  Eat a healthy diet, and follow instructions from your health care provider about eating or drinking restrictions. A healthy diet includes: ? Fresh fruits and vegetables. ? Whole grains. ? Lean meats. ? Low-fat dairy products.   Eat extra salt only as directed. Do not add extra salt to your diet unless your health care provider told you to do that.  Eat frequent, small meals.  Avoid standing up suddenly after eating. Medicines  Take over-the-counter and prescription medicines only as told by your health care provider. ? Follow instructions from your health care provider about changing the dosage of your current medicines, if this applies. ? Do not stop or adjust any of your medicines on your own. General instructions   Wear compression stockings as told by your health care provider.  Get up slowly from lying down or sitting positions. This gives your blood pressure a chance to adjust.  Avoid hot showers and excessive heat as directed by your health care provider.  Return to your normal activities as told by your health care provider. Ask your health care provider what activities are safe for you.  Do not use any products that contain nicotine or tobacco, such as cigarettes, e-cigarettes, and chewing tobacco. If you need help quitting, ask your health care provider.  Keep all follow-up visits as told by your health care provider. This is important. Contact a health care provider if you:  Vomit.  Have diarrhea.  Have a fever for more than 2-3 days.  Feel more thirsty than usual.  Feel weak and tired. Get help right away if you:  Have chest pain.  Have a fast or irregular heartbeat.  Develop numbness in any part of your body.  Cannot move your arms or your legs.  Have trouble speaking.  Become sweaty or feel light-headed.  Faint.  Feel short of breath.  Have trouble staying awake.  Feel confused. Summary  Hypotension is when the force of blood pumping through your arteries is too weak.  Hypotension may be harmless (benign) or may cause serious problems (be critical).  Treatment for this condition may include changing your diet, changing your medicines, and wearing compression  stockings.  In some cases, you may need to go to the hospital for fluid or blood replacement. This information is not intended to replace advice given to you by your health care provider. Make sure you discuss any questions you have with your health care provider. Document Released: 03/11/2005 Document Revised: 09/04/2017 Document Reviewed: 09/04/2017 Elsevier Patient Education  2020 Elsevier Inc.   

## 2019-02-01 NOTE — Progress Notes (Signed)
   Subjective:    Patient ID: Charlotte Perez, female    DOB: 02-07-1961, 58 y.o.   MRN: [id number]  Chief Complaint  Patient presents with  . Follow-up    low blood pressure    HPI PT presents to the office today to recheck hypotension. She was seen on 01/14/19 and found her BP was 96/65. We stopped her Metoprolol 50 mg. Her BP is improved, but still on the lower side.   Her CBC and CMP were stable.   She is followed by [facility]. She is currently taking oxycodone 5.325 mg BID prn and Xanax 1 mg daily. These medications could be causing a decrease in her BP. We discussed this on her last visit. She states she only takes as needed and "never takes more than I need".    She states her dizziness and weakness if better. She states some days are worse than others.   Review of Systems  Neurological: Positive for weakness.  All other systems reviewed and are negative.      Objective:   Physical Exam Vitals signs reviewed.  Constitutional:      General: She is not in acute distress.    Appearance: She is well-developed.  HENT:     Head: Normocephalic and atraumatic.     Right Ear: Tympanic membrane normal.     Left Ear: Tympanic membrane normal.  Eyes:     Pupils: Pupils are equal, round, and reactive to light.  Neck:     Musculoskeletal: Normal range of motion and neck supple.     Thyroid: No thyromegaly.  Cardiovascular:     Rate and Rhythm: Normal rate and regular rhythm.     Heart sounds: Normal heart sounds. No murmur.  Pulmonary:     Effort: Pulmonary effort is normal. No respiratory distress.     Breath sounds: Normal breath sounds. No wheezing.  Abdominal:     General: Bowel sounds are normal. There is no distension.     Palpations: Abdomen is soft.     Tenderness: There is no abdominal tenderness.  Musculoskeletal: Normal range of motion.        General: No tenderness.  Skin:    General: Skin is warm and dry.  Neurological:   Mental Status: She is alert and oriented to person, place, and time.     Cranial Nerves: No cranial nerve deficit.     Deep Tendon Reflexes: Reflexes are normal and symmetric.  Psychiatric:        Behavior: Behavior normal.        Thought Content: Thought content normal.        Judgment: Judgment normal.     BP 102/68   Pulse 86   Temp (!) 97.5 F (36.4 C) (Temporal)   Ht 5' (1.524 m)   Wt 140 lb 9.6 oz (63.8 kg)   SpO2 100%   BMI 27.46 kg/m      Assessment & Plan:  ASHBY LEFLORE comes in today with chief complaint of Follow-up (low blood pressure)   Diagnosis and orders addressed:  1. Hypotension, unspecified hypotension type -Improved, but still on the lower side. Continue to hold metoprolol.  - Iron - BMP8+EGFR  2. Weakness - Iron - BMP8+EGFR  3. Dizziness - Iron - BMP8+EGFR   Labs pending Health Maintenance reviewed Diet and exercise encouraged  Follow up plan: 3 months   Evelina Dun, FNP

## 2019-02-02 LAB — BMP8+EGFR
BUN/Creatinine Ratio: 29 — ABNORMAL HIGH (ref 9–23)
BUN: 18 mg/dL (ref 6–24)
CO2: 24 mmol/L (ref 20–29)
Calcium: 9.6 mg/dL (ref 8.7–10.2)
Chloride: 101 mmol/L (ref 96–106)
Creatinine, Ser: 0.62 mg/dL (ref 0.57–1.00)
GFR calc Af Amer: 114 mL/min/{1.73_m2} (ref >59)
GFR calc non Af Amer: 99 mL/min/{1.73_m2} (ref >59)
Glucose: 131 mg/dL — ABNORMAL HIGH (ref 65–99)
Potassium: 4.9 mmol/L (ref 3.5–5.2)
Sodium: 140 mmol/L (ref 134–144)

## 2019-02-02 LAB — IRON: Iron: 57 ug/dL (ref 27–159)

## 2019-02-13 ENCOUNTER — Other Ambulatory Visit: Payer: Self-pay | Admitting: Family

## 2019-02-13 DIAGNOSIS — E1165 Type 2 diabetes mellitus with hyperglycemia: Secondary | ICD-10-CM

## 2019-02-13 DIAGNOSIS — M5442 Lumbago with sciatica, left side: Secondary | ICD-10-CM

## 2019-02-13 DIAGNOSIS — G8929 Other chronic pain: Secondary | ICD-10-CM

## 2019-02-26 ENCOUNTER — Other Ambulatory Visit: Payer: Self-pay | Admitting: Family

## 2019-03-01 ENCOUNTER — Other Ambulatory Visit: Payer: Self-pay | Admitting: Family

## 2019-03-01 DIAGNOSIS — E1165 Type 2 diabetes mellitus with hyperglycemia: Secondary | ICD-10-CM

## 2019-03-12 ENCOUNTER — Other Ambulatory Visit: Payer: Self-pay | Admitting: Family

## 2019-03-12 DIAGNOSIS — G8929 Other chronic pain: Secondary | ICD-10-CM

## 2019-03-12 DIAGNOSIS — M5442 Lumbago with sciatica, left side: Secondary | ICD-10-CM

## 2019-03-22 ENCOUNTER — Other Ambulatory Visit: Payer: Self-pay

## 2019-03-22 ENCOUNTER — Ambulatory Visit (HOSPITAL_COMMUNITY): Payer: Medicaid Other | Admitting: Psychiatry

## 2019-03-22 ENCOUNTER — Other Ambulatory Visit: Payer: Self-pay | Admitting: Family

## 2019-03-22 ENCOUNTER — Ambulatory Visit (INDEPENDENT_AMBULATORY_CARE_PROVIDER_SITE_OTHER): Payer: Medicaid Other | Admitting: Psychiatry

## 2019-03-22 ENCOUNTER — Encounter (HOSPITAL_COMMUNITY): Payer: Self-pay | Admitting: Psychiatry

## 2019-03-22 DIAGNOSIS — F321 Major depressive disorder, single episode, moderate: Secondary | ICD-10-CM

## 2019-03-22 DIAGNOSIS — E1142 Type 2 diabetes mellitus with diabetic polyneuropathy: Secondary | ICD-10-CM

## 2019-03-22 MED ORDER — ALPRAZOLAM 1 MG PO TABS: ORAL_TABLET | ORAL | 2 refills | Status: DC

## 2019-03-22 MED ORDER — VENLAFAXINE HCL ER 150 MG PO CP24: 300.0000 mg | ORAL_CAPSULE | Freq: Every day | ORAL | 2 refills | Status: DC
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
## 2019-04-07 ENCOUNTER — Other Ambulatory Visit: Payer: Self-pay | Admitting: Family

## 2019-04-07 DIAGNOSIS — E1142 Type 2 diabetes mellitus with diabetic polyneuropathy: Secondary | ICD-10-CM

## 2019-04-19 ENCOUNTER — Other Ambulatory Visit: Payer: Self-pay | Admitting: Family

## 2019-04-19 DIAGNOSIS — E1142 Type 2 diabetes mellitus with diabetic polyneuropathy: Secondary | ICD-10-CM

## 2019-05-04 ENCOUNTER — Ambulatory Visit: Payer: Medicaid Other | Admitting: Family

## 2019-05-18 ENCOUNTER — Ambulatory Visit (INDEPENDENT_AMBULATORY_CARE_PROVIDER_SITE_OTHER): Payer: Medicaid Other | Admitting: Family

## 2019-05-18 ENCOUNTER — Encounter: Payer: Self-pay | Admitting: Family

## 2019-05-18 VITALS — BP 139/80 | HR 95 | Wt 138.0 lb

## 2019-05-18 DIAGNOSIS — D509 Iron deficiency anemia, unspecified: Secondary | ICD-10-CM

## 2019-05-18 DIAGNOSIS — F321 Major depressive disorder, single episode, moderate: Secondary | ICD-10-CM

## 2019-05-18 DIAGNOSIS — K5900 Constipation, unspecified: Secondary | ICD-10-CM

## 2019-05-18 DIAGNOSIS — K219 Gastro-esophageal reflux disease without esophagitis: Secondary | ICD-10-CM | POA: Diagnosis not present

## 2019-05-18 DIAGNOSIS — E1169 Type 2 diabetes mellitus with other specified complication: Secondary | ICD-10-CM

## 2019-05-18 DIAGNOSIS — M545 Low back pain, unspecified: Secondary | ICD-10-CM

## 2019-05-18 DIAGNOSIS — F411 Generalized anxiety disorder: Secondary | ICD-10-CM

## 2019-05-18 DIAGNOSIS — E785 Hyperlipidemia, unspecified: Secondary | ICD-10-CM

## 2019-05-18 DIAGNOSIS — G8929 Other chronic pain: Secondary | ICD-10-CM

## 2019-05-18 DIAGNOSIS — E1142 Type 2 diabetes mellitus with diabetic polyneuropathy: Secondary | ICD-10-CM

## 2019-05-18 NOTE — Progress Notes (Signed)
Virtual Visit via telephone Note Due to COVID-19 pandemic this visit was conducted virtually. This visit type was conducted due to national recommendations for restrictions regarding the COVID-19 Pandemic (e.g. social distancing, sheltering in place) in an effort to limit this patient's exposure and mitigate transmission in our community. All issues noted in this document were discussed and addressed.  A physical exam was not performed with this format.  I connected with Charlotte Perez on 05/18/19 at 9:45 AM by telephone and verified that I am speaking with the correct person using two identifiers. Charlotte Perez is currently located at car and husband is currently with her during visit. The provider, Evelina Dun, FNP is located in their office at time of visit.  I discussed the limitations, risks, security and privacy concerns of performing an evaluation and management service by telephone and the availability of in person appointments. I also discussed with the patient that there may be a patient responsible charge related to this service. The patient expressed understanding and agreed to proceed.   History and Present Illness:  PT calls the office today for chronic follow up. Pt is followed by Behavioral health for ADHD, GAD, and Depressionevery 3 months.Pt is followed by Pain Clinic every month for chronic back pain. All these are stable and doing "well".  Followed by [facility] every 3 months for cataracts and glaucoma.  Diabetes She presents for her follow-up diabetic visit. She has type 2 diabetes mellitus. Her disease course has been stable. Hypoglycemia symptoms include nervousness/anxiousness. Associated symptoms include foot paresthesias. Pertinent negatives for diabetes include no blurred vision and no visual change. Symptoms are stable. Diabetic complications include peripheral neuropathy. Pertinent negatives for diabetic complications include no CVA, heart disease or  nephropathy. Risk factors for coronary artery disease include dyslipidemia, diabetes mellitus, hypertension, sedentary lifestyle and post-menopausal. She is following a generally unhealthy diet. Her overall blood glucose range is 110-130 mg/dl. Eye exam is current.  Gastroesophageal Reflux She complains of belching and heartburn. This is a chronic problem. The current episode started more than 1 year ago. The problem occurs occasionally. The problem has been waxing and waning. Risk factors include obesity. She has tried a PPI for the symptoms. The treatment provided moderate relief.  Hyperlipidemia This is a chronic problem. The current episode started more than 1 year ago. The problem is controlled. Recent lipid tests were reviewed and are normal. Current antihyperlipidemic treatment includes statins. The current treatment provides moderate improvement of lipids. Risk factors for coronary artery disease include dyslipidemia, diabetes mellitus, hypertension, a sedentary lifestyle and post-menopausal.  Depression        This is a chronic problem.  The current episode started more than 1 year ago.   The onset quality is gradual.   The problem occurs intermittently.  Associated symptoms include irritable, restlessness, decreased interest and sad.  Associated symptoms include no helplessness and no hopelessness.  Past treatments include SNRIs - Serotonin and norepinephrine reuptake inhibitors.  Past medical history includes anxiety.   Anxiety Presents for follow-up visit. Symptoms include depressed mood, excessive worry, irritability, nervous/anxious behavior and restlessness. Symptoms occur occasionally. The severity of symptoms is moderate.    Constipation This is a chronic problem. The current episode started more than 1 year ago. The problem has been waxing and waning since onset. Associated symptoms include back pain. She has tried laxatives for the symptoms. The treatment provided mild relief.  Back  Pain This is a chronic problem. The current episode  started more than 1 year ago. The problem occurs intermittently. The problem has been waxing and waning since onset. The pain is present in the lumbar spine. The quality of the pain is described as aching. The pain is at a severity of 3/10. The pain is moderate.      Review of Systems  Constitutional: Positive for irritability.  Eyes: Negative for blurred vision.  Gastrointestinal: Positive for constipation and heartburn.  Musculoskeletal: Positive for back pain.  Psychiatric/Behavioral: Positive for depression. The patient is nervous/anxious.   All other systems reviewed and are negative.    Observations/Objective: No SOB or distress noted   Assessment and Plan: Charlotte Perez comes in today with chief complaint of No chief complaint on file.   Diagnosis and orders addressed:  1. Type 2 diabetes mellitus with diabetic polyneuropathy, without long-term current use of insulin (HCC) - CMP14+EGFR; Future - CBC with Differential/Platelet; Future - Bayer DCA Hb A1c Waived; Future - Microalbumin / creatinine urine ratio; Future  2. Diabetic peripheral neuropathy (HCC) - CMP14+EGFR; Future - CBC with Differential/Platelet; Future  3. Hyperlipidemia associated with type 2 diabetes mellitus (HCC) - CMP14+EGFR; Future - CBC with Differential/Platelet; Future  4. Gastroesophageal reflux disease without esophagitis - CMP14+EGFR; Future - CBC with Differential/Platelet; Future  5. Chronic midline low back pain without sciatica - CMP14+EGFR; Future - CBC with Differential/Platelet; Future  6. Moderate single current episode of major depressive disorder (HCC) - CMP14+EGFR; Future - CBC with Differential/Platelet; Future  7. GAD (generalized anxiety disorder) - CMP14+EGFR; Future - CBC with Differential/Platelet; Future  8. Constipation, unspecified constipation type - CMP14+EGFR; Future - CBC with Differential/Platelet;  Future  9. Iron deficiency anemia, unspecified iron deficiency anemia type - CMP14+EGFR; Future - CBC with Differential/Platelet; Future   Labs pending Health Maintenance reviewed Diet and exercise encouraged  Follow up plan: 6 months     I discussed the assessment and treatment plan with the patient. The patient was provided an opportunity to ask questions and all were answered. The patient agreed with the plan and demonstrated an understanding of the instructions.   The patient was advised to call back or seek an in-person evaluation if the symptoms worsen or if the condition fails to improve as anticipated.  The above assessment and management plan was discussed with the patient. The patient verbalized understanding of and has agreed to the management plan. Patient is aware to call the clinic if symptoms persist or worsen. Patient is aware when to return to the clinic for a follow-up visit. Patient educated on when it is appropriate to go to the emergency department.   Time call ended: 10:03 AM  I provided 18 minutes of non-face-to-face time during this encounter.    Evelina Dun, FNP

## 2019-05-21 ENCOUNTER — Other Ambulatory Visit: Payer: Self-pay | Admitting: Family

## 2019-05-27 ENCOUNTER — Other Ambulatory Visit: Payer: Self-pay

## 2019-05-27 ENCOUNTER — Telehealth: Payer: Self-pay | Admitting: Family

## 2019-05-27 ENCOUNTER — Other Ambulatory Visit: Payer: Medicaid Other

## 2019-05-27 DIAGNOSIS — F321 Major depressive disorder, single episode, moderate: Secondary | ICD-10-CM

## 2019-05-27 DIAGNOSIS — D509 Iron deficiency anemia, unspecified: Secondary | ICD-10-CM

## 2019-05-27 DIAGNOSIS — E1169 Type 2 diabetes mellitus with other specified complication: Secondary | ICD-10-CM

## 2019-05-27 DIAGNOSIS — G8929 Other chronic pain: Secondary | ICD-10-CM

## 2019-05-27 DIAGNOSIS — K5900 Constipation, unspecified: Secondary | ICD-10-CM

## 2019-05-27 DIAGNOSIS — E1142 Type 2 diabetes mellitus with diabetic polyneuropathy: Secondary | ICD-10-CM

## 2019-05-27 DIAGNOSIS — E785 Hyperlipidemia, unspecified: Secondary | ICD-10-CM

## 2019-05-27 DIAGNOSIS — F411 Generalized anxiety disorder: Secondary | ICD-10-CM

## 2019-05-27 DIAGNOSIS — K219 Gastro-esophageal reflux disease without esophagitis: Secondary | ICD-10-CM

## 2019-05-27 LAB — CBC WITH DIFFERENTIAL/PLATELET
Basophils Absolute: 0.1 10*3/uL (ref 0.0–0.2)
Basos: 1 %
EOS (ABSOLUTE): 0.1 10*3/uL (ref 0.0–0.4)
Eos: 2 %
Hematocrit: 37.6 % (ref 34.0–46.6)
Hemoglobin: 12.5 g/dL (ref 11.1–15.9)
Immature Grans (Abs): 0 10*3/uL (ref 0.0–0.1)
Immature Granulocytes: 0 %
Lymphocytes Absolute: 1.7 10*3/uL (ref 0.7–3.1)
Lymphs: 39 %
MCH: 28.5 pg (ref 26.6–33.0)
MCHC: 33.2 g/dL (ref 31.5–35.7)
MCV: 86 fL (ref 79–97)
Monocytes Absolute: 0.4 10*3/uL (ref 0.1–0.9)
Monocytes: 9 %
Neutrophils Absolute: 2.1 10*3/uL (ref 1.4–7.0)
Neutrophils: 49 %
Platelets: 223 10*3/uL (ref 150–450)
RBC: 4.38 x10E6/uL (ref 3.77–5.28)
RDW: 12.5 % (ref 11.7–15.4)
WBC: 4.4 10*3/uL (ref 3.4–10.8)

## 2019-05-27 LAB — CMP14+EGFR
ALT: 11 IU/L (ref 0–32)
AST: 14 IU/L (ref 0–40)
Albumin/Globulin Ratio: 1.6 (ref 1.2–2.2)
Albumin: 4.1 g/dL (ref 3.8–4.9)
Alkaline Phosphatase: 72 IU/L (ref 39–117)
BUN/Creatinine Ratio: 28 — ABNORMAL HIGH (ref 9–23)
BUN: 21 mg/dL (ref 6–24)
Bilirubin Total: <0.2 mg/dL (ref 0.0–1.2)
CO2: 22 mmol/L (ref 20–29)
Calcium: 9.7 mg/dL (ref 8.7–10.2)
Chloride: 99 mmol/L (ref 96–106)
Creatinine, Ser: 0.76 mg/dL (ref 0.57–1.00)
GFR calc Af Amer: 99 mL/min/{1.73_m2} (ref >59)
GFR calc non Af Amer: 86 mL/min/{1.73_m2} (ref >59)
Globulin, Total: 2.6 g/dL (ref 1.5–4.5)
Glucose: 105 mg/dL — ABNORMAL HIGH (ref 65–99)
Potassium: 4.6 mmol/L (ref 3.5–5.2)
Sodium: 139 mmol/L (ref 134–144)
Total Protein: 6.7 g/dL (ref 6.0–8.5)

## 2019-05-27 LAB — BAYER DCA HB A1C WAIVED: HB A1C (BAYER DCA - WAIVED): 7 % — ABNORMAL HIGH (ref <7.0)

## 2019-05-27 NOTE — Telephone Encounter (Signed)
Patient aware of A1c result.

## 2019-05-28 LAB — MICROALBUMIN / CREATININE URINE RATIO
Creatinine, Urine: 75.3 mg/dL
Microalb/Creat Ratio: 8 mg/g creat (ref 0–29)
Microalbumin, Urine: 5.8 ug/mL

## 2019-06-22 ENCOUNTER — Ambulatory Visit (INDEPENDENT_AMBULATORY_CARE_PROVIDER_SITE_OTHER): Payer: Medicaid Other | Admitting: Psychiatry

## 2019-06-22 ENCOUNTER — Encounter (HOSPITAL_COMMUNITY): Payer: Self-pay | Admitting: Psychiatry

## 2019-06-22 ENCOUNTER — Other Ambulatory Visit: Payer: Self-pay

## 2019-06-22 DIAGNOSIS — F321 Major depressive disorder, single episode, moderate: Secondary | ICD-10-CM

## 2019-06-22 MED ORDER — VENLAFAXINE HCL ER 150 MG PO CP24: 300.0000 mg | ORAL_CAPSULE | Freq: Every day | ORAL | 2 refills | Status: DC

## 2019-06-22 MED ORDER — ALPRAZOLAM 1 MG PO TABS: ORAL_TABLET | ORAL | 2 refills | Status: DC
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
## 2019-07-08 ENCOUNTER — Other Ambulatory Visit: Payer: Self-pay | Admitting: Family

## 2019-07-12 ENCOUNTER — Other Ambulatory Visit: Payer: Self-pay | Admitting: Family

## 2019-07-12 DIAGNOSIS — E1165 Type 2 diabetes mellitus with hyperglycemia: Secondary | ICD-10-CM

## 2019-08-03 ENCOUNTER — Other Ambulatory Visit: Payer: Self-pay | Admitting: Family

## 2019-08-03 DIAGNOSIS — E1165 Type 2 diabetes mellitus with hyperglycemia: Secondary | ICD-10-CM

## 2019-08-25 DIAGNOSIS — Q6102 Congenital multiple renal cysts: Secondary | ICD-10-CM | POA: Diagnosis not present

## 2019-08-25 DIAGNOSIS — R1030 Lower abdominal pain, unspecified: Secondary | ICD-10-CM | POA: Diagnosis not present

## 2019-08-25 DIAGNOSIS — R110 Nausea: Secondary | ICD-10-CM | POA: Diagnosis not present

## 2019-08-25 DIAGNOSIS — R1010 Upper abdominal pain, unspecified: Secondary | ICD-10-CM | POA: Diagnosis not present

## 2019-08-25 DIAGNOSIS — R1084 Generalized abdominal pain: Secondary | ICD-10-CM | POA: Diagnosis not present

## 2019-09-11 DIAGNOSIS — N281 Cyst of kidney, acquired: Secondary | ICD-10-CM | POA: Diagnosis not present

## 2019-09-11 DIAGNOSIS — R0789 Other chest pain: Secondary | ICD-10-CM | POA: Diagnosis not present

## 2019-09-11 DIAGNOSIS — M79675 Pain in left toe(s): Secondary | ICD-10-CM | POA: Diagnosis not present

## 2019-09-21 ENCOUNTER — Encounter (HOSPITAL_COMMUNITY): Payer: Self-pay | Admitting: Psychiatry

## 2019-09-21 ENCOUNTER — Other Ambulatory Visit: Payer: Self-pay

## 2019-09-21 ENCOUNTER — Other Ambulatory Visit: Payer: Self-pay | Admitting: Family

## 2019-09-21 ENCOUNTER — Telehealth (INDEPENDENT_AMBULATORY_CARE_PROVIDER_SITE_OTHER): Payer: Medicaid Other | Admitting: Psychiatry

## 2019-09-21 DIAGNOSIS — F321 Major depressive disorder, single episode, moderate: Secondary | ICD-10-CM

## 2019-09-21 DIAGNOSIS — E1142 Type 2 diabetes mellitus with diabetic polyneuropathy: Secondary | ICD-10-CM

## 2019-09-21 MED ORDER — VENLAFAXINE HCL ER 150 MG PO CP24: 300.0000 mg | ORAL_CAPSULE | Freq: Every day | ORAL | 2 refills | Status: DC

## 2019-09-21 MED ORDER — ALPRAZOLAM 1 MG PO TABS: ORAL_TABLET | ORAL | 2 refills | Status: DC
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
## 2019-09-23 DIAGNOSIS — F329 Major depressive disorder, single episode, unspecified: Secondary | ICD-10-CM | POA: Diagnosis not present

## 2019-09-23 DIAGNOSIS — J45909 Unspecified asthma, uncomplicated: Secondary | ICD-10-CM | POA: Diagnosis not present

## 2019-09-23 DIAGNOSIS — F419 Anxiety disorder, unspecified: Secondary | ICD-10-CM | POA: Diagnosis not present

## 2019-09-23 DIAGNOSIS — Z419 Encounter for procedure for purposes other than remedying health state, unspecified: Secondary | ICD-10-CM | POA: Diagnosis not present

## 2019-09-23 DIAGNOSIS — Z79899 Other long term (current) drug therapy: Secondary | ICD-10-CM | POA: Diagnosis not present

## 2019-09-23 DIAGNOSIS — E119 Type 2 diabetes mellitus without complications: Secondary | ICD-10-CM | POA: Diagnosis not present

## 2019-09-29 ENCOUNTER — Telehealth (HOSPITAL_COMMUNITY): Payer: Self-pay | Admitting: *Deleted

## 2019-09-29 NOTE — Telephone Encounter (Signed)
Opened in Error.

## 2019-09-30 ENCOUNTER — Ambulatory Visit: Payer: Medicaid Other | Admitting: Family

## 2019-10-26 DIAGNOSIS — M545 Low back pain: Secondary | ICD-10-CM | POA: Diagnosis not present

## 2019-10-26 DIAGNOSIS — Z79899 Other long term (current) drug therapy: Secondary | ICD-10-CM | POA: Diagnosis not present

## 2019-10-26 DIAGNOSIS — G8929 Other chronic pain: Secondary | ICD-10-CM | POA: Diagnosis not present

## 2019-10-26 DIAGNOSIS — I10 Essential (primary) hypertension: Secondary | ICD-10-CM | POA: Diagnosis not present

## 2019-11-05 DIAGNOSIS — Z1159 Encounter for screening for other viral diseases: Secondary | ICD-10-CM | POA: Diagnosis not present

## 2019-11-24 ENCOUNTER — Other Ambulatory Visit: Payer: Self-pay | Admitting: Family

## 2019-11-25 DIAGNOSIS — G8929 Other chronic pain: Secondary | ICD-10-CM | POA: Diagnosis not present

## 2019-11-25 DIAGNOSIS — Z79899 Other long term (current) drug therapy: Secondary | ICD-10-CM | POA: Diagnosis not present

## 2019-11-25 DIAGNOSIS — M545 Low back pain: Secondary | ICD-10-CM | POA: Diagnosis not present

## 2019-11-25 DIAGNOSIS — I10 Essential (primary) hypertension: Secondary | ICD-10-CM | POA: Diagnosis not present

## 2019-11-30 DIAGNOSIS — G5761 Lesion of plantar nerve, right lower limb: Secondary | ICD-10-CM | POA: Diagnosis not present

## 2019-11-30 DIAGNOSIS — E119 Type 2 diabetes mellitus without complications: Secondary | ICD-10-CM | POA: Diagnosis not present

## 2019-11-30 DIAGNOSIS — G5762 Lesion of plantar nerve, left lower limb: Secondary | ICD-10-CM | POA: Diagnosis not present

## 2019-12-10 ENCOUNTER — Other Ambulatory Visit: Payer: Self-pay | Admitting: Family

## 2019-12-10 DIAGNOSIS — E1165 Type 2 diabetes mellitus with hyperglycemia: Secondary | ICD-10-CM

## 2019-12-10 DIAGNOSIS — E1142 Type 2 diabetes mellitus with diabetic polyneuropathy: Secondary | ICD-10-CM

## 2019-12-13 ENCOUNTER — Other Ambulatory Visit (HOSPITAL_COMMUNITY): Payer: Self-pay | Admitting: Psychiatry

## 2019-12-22 ENCOUNTER — Other Ambulatory Visit: Payer: Self-pay

## 2019-12-22 ENCOUNTER — Encounter (HOSPITAL_COMMUNITY): Payer: Self-pay | Admitting: Psychiatry

## 2019-12-22 ENCOUNTER — Telehealth (INDEPENDENT_AMBULATORY_CARE_PROVIDER_SITE_OTHER): Payer: Medicaid Other | Admitting: Psychiatry

## 2019-12-22 DIAGNOSIS — F321 Major depressive disorder, single episode, moderate: Secondary | ICD-10-CM

## 2019-12-22 MED ORDER — ALPRAZOLAM 1 MG PO TABS: ORAL_TABLET | ORAL | 2 refills | Status: DC

## 2019-12-22 MED ORDER — VENLAFAXINE HCL ER 150 MG PO CP24: 300.0000 mg | ORAL_CAPSULE | Freq: Every day | ORAL | 2 refills | Status: DC
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
## 2019-12-24 DIAGNOSIS — G8929 Other chronic pain: Secondary | ICD-10-CM | POA: Diagnosis not present

## 2019-12-24 DIAGNOSIS — E1140 Type 2 diabetes mellitus with diabetic neuropathy, unspecified: Secondary | ICD-10-CM | POA: Diagnosis not present

## 2019-12-24 DIAGNOSIS — Z79899 Other long term (current) drug therapy: Secondary | ICD-10-CM | POA: Diagnosis not present

## 2019-12-24 DIAGNOSIS — M545 Low back pain, unspecified: Secondary | ICD-10-CM | POA: Diagnosis not present

## 2020-01-13 DIAGNOSIS — G5761 Lesion of plantar nerve, right lower limb: Secondary | ICD-10-CM | POA: Diagnosis not present

## 2020-01-13 DIAGNOSIS — G5762 Lesion of plantar nerve, left lower limb: Secondary | ICD-10-CM | POA: Diagnosis not present

## 2020-01-13 DIAGNOSIS — Z23 Encounter for immunization: Secondary | ICD-10-CM | POA: Diagnosis not present

## 2020-01-13 DIAGNOSIS — L219 Seborrheic dermatitis, unspecified: Secondary | ICD-10-CM | POA: Diagnosis not present

## 2020-01-13 DIAGNOSIS — E119 Type 2 diabetes mellitus without complications: Secondary | ICD-10-CM | POA: Diagnosis not present

## 2020-01-18 DIAGNOSIS — Z79899 Other long term (current) drug therapy: Secondary | ICD-10-CM | POA: Diagnosis not present

## 2020-01-18 DIAGNOSIS — E1140 Type 2 diabetes mellitus with diabetic neuropathy, unspecified: Secondary | ICD-10-CM | POA: Diagnosis not present

## 2020-01-18 DIAGNOSIS — G8929 Other chronic pain: Secondary | ICD-10-CM | POA: Diagnosis not present

## 2020-01-18 DIAGNOSIS — M545 Low back pain, unspecified: Secondary | ICD-10-CM | POA: Diagnosis not present

## 2020-02-09 ENCOUNTER — Encounter: Payer: Self-pay | Admitting: Nurse Practitioner

## 2020-02-09 ENCOUNTER — Other Ambulatory Visit: Payer: Self-pay

## 2020-02-09 ENCOUNTER — Ambulatory Visit (INDEPENDENT_AMBULATORY_CARE_PROVIDER_SITE_OTHER): Payer: Medicaid Other | Admitting: Nurse Practitioner

## 2020-02-09 VITALS — BP 114/73 | HR 86 | Temp 98.4°F | Resp 20 | Ht 60.0 in | Wt 137.0 lb

## 2020-02-09 DIAGNOSIS — M19041 Primary osteoarthritis, right hand: Secondary | ICD-10-CM

## 2020-02-09 DIAGNOSIS — E1169 Type 2 diabetes mellitus with other specified complication: Secondary | ICD-10-CM | POA: Diagnosis not present

## 2020-02-09 DIAGNOSIS — F419 Anxiety disorder, unspecified: Secondary | ICD-10-CM | POA: Diagnosis not present

## 2020-02-09 DIAGNOSIS — E1142 Type 2 diabetes mellitus with diabetic polyneuropathy: Secondary | ICD-10-CM | POA: Diagnosis not present

## 2020-02-09 DIAGNOSIS — G8929 Other chronic pain: Secondary | ICD-10-CM | POA: Diagnosis not present

## 2020-02-09 DIAGNOSIS — J4540 Moderate persistent asthma, uncomplicated: Secondary | ICD-10-CM

## 2020-02-09 DIAGNOSIS — M79672 Pain in left foot: Secondary | ICD-10-CM

## 2020-02-09 DIAGNOSIS — E785 Hyperlipidemia, unspecified: Secondary | ICD-10-CM

## 2020-02-09 DIAGNOSIS — M545 Low back pain, unspecified: Secondary | ICD-10-CM

## 2020-02-09 DIAGNOSIS — F330 Major depressive disorder, recurrent, mild: Secondary | ICD-10-CM

## 2020-02-09 MED ORDER — ACCU-CHEK GUIDE VI STRP: ORAL_STRIP | 3 refills | Status: DC

## 2020-02-09 NOTE — Progress Notes (Signed)
New Patient Office Visit  Subjective:  Patient ID: Charlotte Perez, female    DOB: 04/22/1960  Age: 59 y.o. MRN: [id number]  CC:  Chief Complaint  Patient presents with  . New Patient (Initial Visit)  . Foot Pain    burning, tingling, painful     HPI Charlotte Perez presents for new patient visit. She is transferring care from Dr. Marlon Pel in [place] Last physical was over a year ago and labs were done well over 3 months ago.  She states that her feet are burning and throbbing and hurt to walk on.  Her foot pain started 5-6 years ago, and it is getting worse.  Past Medical History:  Diagnosis Date  . Arthritis   . Asthma   . Bipolar disorder ([place])   . Chronic back pain   . Chronic neck pain   . COPD (chronic obstructive pulmonary disease) ([place])   . DDD (degenerative disc disease), cervical   . DDD (degenerative disc disease), lumbar   . Depression   . Diabetes mellitus, type II ([place])   . Gallbladder sludge   . GERD (gastroesophageal reflux disease)   . Hyperlipidemia   . Hypertension   . Lumbar radiculopathy   . Mole (skin)   . Neuropathy of foot   . Renal cyst   . Shingles   . Skin lesions, generalized    1.2 CM FLAT FAWN COLOR AT THE T10 AREA JUST RIGHT OF SPINE     Past Surgical History:  Procedure Laterality Date  . ABDOMINAL HYSTERECTOMY     total hysterectomy  . BACK SURGERY    . CESAREAN SECTION     x 2  . NECK SURGERY      Family History  Problem Relation Age of Onset  . ADD / ADHD Other   . COPD Father   . Alcohol abuse Father   . Depression Daughter   . Alcohol abuse Mother   . COPD Mother        on oxygen  . Colon cancer Paternal Grandfather   . Heart disease Sister   . Heart disease Brother   . Arthritis Sister        knee replacement  . Diabetes Maternal Grandmother   . Diabetes Sister     Social History   Socioeconomic History  . Marital status: Married    Spouse name: [name]  . Number of children: 3  . Years of education:  Not on file  . Highest education level: Not on file  Occupational History  . Occupation: disablility  Tobacco Use  . Smoking status: Passive Smoke Exposure - Never Smoker  . Smokeless tobacco: Never Used  Vaping Use  . Vaping Use: Never used  Substance and Sexual Activity  . Alcohol use: No  . Drug use: No  . Sexual activity: Yes    Birth control/protection: Surgical  Other Topics Concern  . Not on file  Social History Narrative  . Not on file   Social Determinants of Health   Financial Resource Strain:   . Difficulty of Paying Living Expenses: Not on file  Food Insecurity:   . Worried About Charity fundraiser in the Last Year: Not on file  . Ran Out of Food in the Last Year: Not on file  Transportation Needs:   . Lack of Transportation (Medical): Not on file  . Lack of Transportation (Non-Medical): Not on file  Physical Activity:   . Days of Exercise per Week:  Not on file  . Minutes of Exercise per Session: Not on file  Stress:   . Feeling of Stress : Not on file  Social Connections:   . Frequency of Communication with Friends and Family: Not on file  . Frequency of Social Gatherings with Friends and Family: Not on file  . Attends Religious Services: Not on file  . Active Member of Clubs or Organizations: Not on file  . Attends Archivist Meetings: Not on file  . Marital Status: Not on file  Intimate Partner Violence:   . Fear of Current or Ex-Partner: Not on file  . Emotionally Abused: Not on file  . Physically Abused: Not on file  . Sexually Abused: Not on file    ROS Review of Systems  Constitutional: Negative.   Respiratory: Positive for chest tightness. Negative for cough, shortness of breath and wheezing.        Has COPD/asthma and this is her baseline  Cardiovascular: Negative.   Neurological: Positive for numbness.       Paresthesia to left foot; has pain with walking    Objective:   Today's Vitals: BP 114/73   Pulse 86   Temp 98.4 F  (36.9 C)   Resp 20   Ht 5' (1.524 m)   Wt 137 lb (62.1 kg)   SpO2 96%   BMI 26.76 kg/m   Physical Exam Constitutional:      Appearance: Normal appearance.  Cardiovascular:     Rate and Rhythm: Normal rate and regular rhythm.     Pulses: Normal pulses.     Heart sounds: Normal heart sounds.  Pulmonary:     Effort: Pulmonary effort is normal.     Breath sounds: Normal breath sounds.  Neurological:     Mental Status: She is alert and oriented to person, place, and time.     Sensory: Sensory deficit present.     Motor: No weakness.     Comments: Sole of left foot has some decreased sensation     Assessment & Plan:   Problem List Items Addressed This Visit      Endocrine   Diabetes mellitus ([place]) - Primary   Hyperlipidemia associated with type 2 diabetes mellitus (HCC)     Other   Chronic low back pain    Other Visit Diagnoses    Osteoarthritis of right hand, unspecified osteoarthritis type       Moderate persistent asthma without complication       Major depressive disorder, recurrent, mild ([facility])       Anxiety disorder, unspecified type       Left foot pain          Diabetes -takes pioglitazone 15 mg daily -takes jardiance 10 mg daily -takes metformin 1000 mg PO BID -one ACEi (lisinopril) and statin (atorvastatin) -uses accu-check strips and lancets -has neuropathy to feet; intolerance to Cymbalta and gabapentin -her blood sugar readings range from 84-426 -needs diabetic eye exam and foot exam -refilled glucose test strips  Left foot pain -most likely caused by neuropathy -she has been treated for this in the past, but is intolerant of gabapentin and cymbalta -will check A1c to see if blood sugar is controlled -if A1c is great, will consider podiatry referral  HLD -no labs to review today -takes atorvastatin 20 mg PO daily  Chronic Back Pain -takes hydrocodone -she states she has T12 bone spurs -hx of neck surgery -followed by Dr. Royce Macadamia, or  Rosezella Florida, in [place] for pain  management  Atherosclerosis -takes ASA 81 mg daily -no acute issues  Arthritis Affects right middle finger  Asthma/COPD -uses albuterol nebs; doesn't get much effect with inhaler -uses nebs about 2x/day  Depression -followed by [name] -takes venlafaxine  Anxiety -followed by [name] -takes xanax qhs   Outpatient Encounter Medications as of 02/09/2020  Medication Sig  . ACCU-CHEK FASTCLIX LANCETS MISC EVERY DAY  . albuterol (PROVENTIL) (2.5 MG/3ML) 0.083% nebulizer solution Take 3 mLs (2.5 mg total) by nebulization every 6 (six) hours as needed for wheezing or shortness of breath.  . ALPRAZolam (XANAX) 1 MG tablet TAKE ONE TABLET AT BEDTIME AS NEEDED  . Ascorbic Acid (VITAMIN C PO) Take by mouth. ImmuBlast  . aspirin 81 MG EC tablet Take 1 tablet (81 mg total) by mouth daily. Swallow whole.  Marland Kitchen atorvastatin (LIPITOR) 20 MG tablet TAKE ONE (1) TABLET EACH DAY  . cycloSPORINE (RESTASIS) 0.05 % ophthalmic emulsion 1 drop 2 (two) times daily.  Marland Kitchen glucose blood (ACCU-CHEK GUIDE) test strip Use daily Dx E11.9  . JARDIANCE 10 MG TABS tablet TAKE ONE (1) TABLET EACH DAY  . lisinopril (ZESTRIL) 5 MG tablet TAKE ONE (1) TABLET EACH DAY  . metFORMIN (GLUCOPHAGE) 1000 MG tablet TAKE ONE TABLET TWICE A DAY WITH FOOD  . naproxen (NAPROSYN) 500 MG tablet TAKE ONE TABLET TWICE A DAY WITH FOOD  . omeprazole (PRILOSEC) 20 MG capsule TAKE ONE (1) CAPSULE EACH DAY  . OXYCODONE ER PO Take by mouth.  . pioglitazone (ACTOS) 15 MG tablet TAKE ONE (1) TABLET EACH DAY  . venlafaxine XR (EFFEXOR-XR) 150 MG 24 hr capsule Take 2 capsules (300 mg total) by mouth at bedtime.  . [DISCONTINUED] glucose blood (ACCU-CHEK GUIDE) test strip Use daily Dx E11.9   No facility-administered encounter medications on file as of 02/09/2020.    Follow-up: Return in about 1 week (around 02/16/2020) for Annual Physical Exam with Foot Exam.   Noreene Larsson, NP

## 2020-02-09 NOTE — Patient Instructions (Signed)
Diabetes -takes pioglitazone 15 mg daily -takes jardiance 10 mg daily -takes metformin 1000 mg PO BID -one ACEi (lisinopril) and statin (atorvastatin) -uses accu-check strips and lancets -has neuropathy to feet; intolerance to Cymbalta and gabapentin -her blood sugar readings range from 84-426 -needs diabetic eye exam and foot exam -refilled glucose test strips  Left foot pain -most likely caused by neuropathy -she has been treated for this in the past, but is intolerant of gabapentin and cymbalta -will check A1c to see if blood sugar is controlled -if A1c is great, will consider podiatry referral  HLD -no labs to review today -takes atorvastatin 20 mg PO daily  Chronic Back Pain -takes hydrocodone -she states she has T12 bone spurs -hx of neck surgery -followed by Dr. Royce Macadamia, or Rosezella Florida, in [place] for pain management  Atherosclerosis -takes ASA 81 mg daily -no acute issues  Arthritis Affects right middle finger  Asthma/COPD -uses albuterol nebs; doesn't get much effect with inhaler -uses nebs about 2x/day  Depression -followed by [name] -takes venlafaxine  Anxiety -followed by [name] -takes xanax qhs

## 2020-02-10 LAB — CBC WITH DIFFERENTIAL/PLATELET
Basophils Absolute: 0.1 10*3/uL (ref 0.0–0.2)
Basos: 1 %
EOS (ABSOLUTE): 0.1 10*3/uL (ref 0.0–0.4)
Eos: 3 %
Hematocrit: 37.7 % (ref 34.0–46.6)
Hemoglobin: 12.4 g/dL (ref 11.1–15.9)
Immature Grans (Abs): 0 10*3/uL (ref 0.0–0.1)
Immature Granulocytes: 0 %
Lymphocytes Absolute: 1.6 10*3/uL (ref 0.7–3.1)
Lymphs: 32 %
MCH: 28.5 pg (ref 26.6–33.0)
MCHC: 32.9 g/dL (ref 31.5–35.7)
MCV: 87 fL (ref 79–97)
Monocytes Absolute: 0.5 10*3/uL (ref 0.1–0.9)
Monocytes: 9 %
Neutrophils Absolute: 2.8 10*3/uL (ref 1.4–7.0)
Neutrophils: 55 %
Platelets: 254 10*3/uL (ref 150–450)
RBC: 4.35 x10E6/uL (ref 3.77–5.28)
RDW: 12.8 % (ref 11.7–15.4)
WBC: 5.1 10*3/uL (ref 3.4–10.8)

## 2020-02-10 LAB — CMP14+EGFR
ALT: 13 IU/L (ref 0–32)
AST: 14 IU/L (ref 0–40)
Albumin/Globulin Ratio: 1.6 (ref 1.2–2.2)
Albumin: 4.2 g/dL (ref 3.8–4.9)
Alkaline Phosphatase: 71 IU/L (ref 44–121)
BUN/Creatinine Ratio: 22 (ref 12–28)
BUN: 15 mg/dL (ref 8–27)
Bilirubin Total: <0.2 mg/dL (ref 0.0–1.2)
CO2: 23 mmol/L (ref 20–29)
Calcium: 9.8 mg/dL (ref 8.7–10.3)
Chloride: 101 mmol/L (ref 96–106)
Creatinine, Ser: 0.69 mg/dL (ref 0.57–1.00)
GFR calc Af Amer: 109 mL/min/{1.73_m2} (ref >59)
GFR calc non Af Amer: 95 mL/min/{1.73_m2} (ref >59)
Globulin, Total: 2.7 g/dL (ref 1.5–4.5)
Glucose: 127 mg/dL — ABNORMAL HIGH (ref 65–99)
Potassium: 4.4 mmol/L (ref 3.5–5.2)
Sodium: 140 mmol/L (ref 134–144)
Total Protein: 6.9 g/dL (ref 6.0–8.5)

## 2020-02-10 LAB — LIPID PANEL WITH LDL/HDL RATIO
Cholesterol, Total: 230 mg/dL — ABNORMAL HIGH (ref 100–199)
HDL: 86 mg/dL (ref >39)
LDL Chol Calc (NIH): 135 mg/dL — ABNORMAL HIGH (ref 0–99)
LDL/HDL Ratio: 1.6 ratio (ref 0.0–3.2)
Triglycerides: 55 mg/dL (ref 0–149)
VLDL Cholesterol Cal: 9 mg/dL (ref 5–40)

## 2020-02-10 LAB — HEMOGLOBIN A1C
Est. average glucose Bld gHb Est-mCnc: 140 mg/dL
Hgb A1c MFr Bld: 6.5 % — ABNORMAL HIGH (ref 4.8–5.6)

## 2020-02-16 ENCOUNTER — Other Ambulatory Visit: Payer: Self-pay

## 2020-02-16 ENCOUNTER — Encounter: Payer: Self-pay | Admitting: Nurse Practitioner

## 2020-02-16 ENCOUNTER — Ambulatory Visit (INDEPENDENT_AMBULATORY_CARE_PROVIDER_SITE_OTHER): Payer: Medicaid Other | Admitting: Nurse Practitioner

## 2020-02-16 VITALS — BP 112/73 | HR 100 | Temp 99.3°F | Resp 18 | Ht 60.0 in | Wt 137.0 lb

## 2020-02-16 DIAGNOSIS — G8929 Other chronic pain: Secondary | ICD-10-CM

## 2020-02-16 DIAGNOSIS — F411 Generalized anxiety disorder: Secondary | ICD-10-CM | POA: Diagnosis not present

## 2020-02-16 DIAGNOSIS — M545 Low back pain, unspecified: Secondary | ICD-10-CM | POA: Diagnosis not present

## 2020-02-16 DIAGNOSIS — E785 Hyperlipidemia, unspecified: Secondary | ICD-10-CM

## 2020-02-16 DIAGNOSIS — K219 Gastro-esophageal reflux disease without esophagitis: Secondary | ICD-10-CM | POA: Diagnosis not present

## 2020-02-16 DIAGNOSIS — G43109 Migraine with aura, not intractable, without status migrainosus: Secondary | ICD-10-CM | POA: Diagnosis not present

## 2020-02-16 DIAGNOSIS — E1169 Type 2 diabetes mellitus with other specified complication: Secondary | ICD-10-CM

## 2020-02-16 DIAGNOSIS — F321 Major depressive disorder, single episode, moderate: Secondary | ICD-10-CM | POA: Diagnosis not present

## 2020-02-16 DIAGNOSIS — E1142 Type 2 diabetes mellitus with diabetic polyneuropathy: Secondary | ICD-10-CM | POA: Diagnosis not present

## 2020-02-16 MED ORDER — ATORVASTATIN CALCIUM 40 MG PO TABS: 40.0000 mg | ORAL_TABLET | Freq: Every day | ORAL | 3 refills | Status: DC

## 2020-02-16 MED ORDER — PREGABALIN 50 MG PO CAPS: 50.0000 mg | ORAL_CAPSULE | Freq: Three times a day (TID) | ORAL | 3 refills | Status: DC

## 2020-02-16 NOTE — Assessment & Plan Note (Signed)
-  followed by pain management, Dr. Royce Macadamia in [facility]

## 2020-02-16 NOTE — Assessment & Plan Note (Signed)
-  no issues today -continue omeprazole

## 2020-02-16 NOTE — Progress Notes (Signed)
Established Patient Office Visit  Subjective:  Patient ID: Charlotte Perez, female    DOB: Dec 08, 1960  Age: 59 y.o. MRN: [id number]  CC:  Chief Complaint  Patient presents with  . Annual Exam    HPI [name] presents for annual physical exam. She states that both of her feet are hurting.  She has been hurting for several years.  Past Medical History:  Diagnosis Date  . 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 Arthritis   . Asthma   . Bipolar disorder ([place])   . Chronic back pain   . Chronic neck pain   . Constipation 09/05/2016  . COPD (chronic obstructive pulmonary disease) ([employer])   . DDD (degenerative disc disease), cervical   . DDD (degenerative disc disease), lumbar   . Depression   . Diabetes mellitus ([place]) 06/27/2010  . Diabetes mellitus, type II ([place])   . Gallbladder sludge   . GERD (gastroesophageal reflux disease)   . Hyperlipidemia   . Hypertension   . Iron deficiency anemia 09/05/2016  . Lumbar radiculopathy   . Mole (skin)   . Neuropathy of foot   . Renal cyst   . Shingles   . Skin lesions, generalized    1.2 CM FLAT FAWN COLOR AT THE T10 AREA JUST RIGHT OF SPINE     Past Surgical History:  Procedure Laterality Date  . ABDOMINAL HYSTERECTOMY     total hysterectomy  . BACK SURGERY    . CESAREAN SECTION     x 2  . NECK SURGERY      Family History  Problem Relation Age of Onset  . ADD / ADHD Other   . COPD Father   . Alcohol abuse Father   . Depression Daughter   . Alcohol abuse Mother   . COPD Mother        on oxygen  . Colon cancer Paternal Grandfather   . Heart disease Sister   . Heart disease Brother   . Arthritis Sister        knee replacement  . Diabetes Maternal Grandmother   . Diabetes Sister     Social History   Socioeconomic History  . Marital status: Married    Spouse name: [name]  . Number of children: 3  . Years of education: Not on file  . Highest education level: Not on file    Occupational History  . Occupation: disablility  Tobacco Use  . Smoking status: Passive Smoke Exposure - Never Smoker  . Smokeless tobacco: Never Used  Vaping Use  . Vaping Use: Never used  Substance and Sexual Activity  . Alcohol use: No  . Drug use: No  . Sexual activity: Yes    Birth control/protection: Surgical  Other Topics Concern  . Not on file  Social History Narrative  . Not on file   Social Determinants of Health   Financial Resource Strain:   . Difficulty of Paying Living Expenses: Not on file  Food Insecurity:   . Worried About Charity fundraiser in the Last Year: Not on file  . Ran Out of Food in the Last Year: Not on file  Transportation Needs:   . Lack of Transportation (Medical): Not on file  . Lack of Transportation (Non-Medical): Not on file  Physical Activity:   . Days of Exercise per Week: Not on file  . Minutes of Exercise per Session: Not on file  Stress:   . Feeling of Stress : Not  on file  Social Connections:   . Frequency of Communication with Friends and Family: Not on file  . Frequency of Social Gatherings with Friends and Family: Not on file  . Attends Religious Services: Not on file  . Active Member of Clubs or Organizations: Not on file  . Attends Banker Meetings: Not on file  . Marital Status: Not on file  Intimate Partner Violence:   . Fear of Current or Ex-Partner: Not on file  . Emotionally Abused: Not on file  . Physically Abused: Not on file  . Sexually Abused: Not on file    Outpatient Medications Prior to Visit  Medication Sig Dispense Refill  . ACCU-CHEK FASTCLIX LANCETS MISC EVERY DAY 102 each 2  . albuterol (PROVENTIL) (2.5 MG/3ML) 0.083% nebulizer solution Take 3 mLs (2.5 mg total) by nebulization every 6 (six) hours as needed for wheezing or shortness of breath. 75 mL 3  . ALPRAZolam (XANAX) 1 MG tablet TAKE ONE TABLET AT BEDTIME AS NEEDED 30 tablet 2  . Ascorbic Acid (VITAMIN C PO) Take by mouth. ImmuBlast     . aspirin 81 MG EC tablet Take 1 tablet (81 mg total) by mouth daily. Swallow whole.    . cycloSPORINE (RESTASIS) 0.05 % ophthalmic emulsion 1 drop 2 (two) times daily.    Marland Kitchen glucose blood (ACCU-CHEK GUIDE) test strip Use daily Dx E11.9 100 each 3  . JARDIANCE 10 MG TABS tablet TAKE ONE (1) TABLET EACH DAY 90 tablet 0  . lisinopril (ZESTRIL) 5 MG tablet TAKE ONE (1) TABLET EACH DAY 90 tablet 0  . metFORMIN (GLUCOPHAGE) 1000 MG tablet TAKE ONE TABLET TWICE A DAY WITH FOOD 180 tablet 0  . naproxen (NAPROSYN) 500 MG tablet TAKE ONE TABLET TWICE A DAY WITH FOOD 60 tablet 0  . omeprazole (PRILOSEC) 20 MG capsule TAKE ONE (1) CAPSULE EACH DAY 90 capsule 1  . OXYCODONE ER PO Take by mouth.    . pioglitazone (ACTOS) 15 MG tablet TAKE ONE (1) TABLET EACH DAY 90 tablet 0  . venlafaxine XR (EFFEXOR-XR) 150 MG 24 hr capsule Take 2 capsules (300 mg total) by mouth at bedtime. 60 capsule 2  . atorvastatin (LIPITOR) 20 MG tablet TAKE ONE (1) TABLET EACH DAY 90 tablet 1   No facility-administered medications prior to visit.    Allergies  Allergen Reactions  . Cymbalta [Duloxetine Hcl]     Per pt it started making her mouth tingle and could not taste anything  . Gabapentin     Itching, fidgety  . Meloxicam Other (See Comments)    Tongue tingling, and lost sense of taste.  . Tramadol Other (See Comments)    Tongue tingling, and lost sense of taste.    ROS Review of Systems  Constitutional: Negative.   HENT: Negative.   Eyes: Positive for visual disturbance. Negative for photophobia, pain, discharge, redness and itching.       Blurred vision at times  Respiratory: Negative.   Cardiovascular: Negative.   Gastrointestinal: Negative.   Endocrine: Negative.   Genitourinary: Negative.   Musculoskeletal: Negative.   Allergic/Immunologic: Negative.   Neurological: Positive for numbness.       To bilateral feet  Hematological: Negative.   Psychiatric/Behavioral: Negative.       Objective:     Physical Exam Constitutional:      Appearance: Normal appearance.  HENT:     Head: Normocephalic and atraumatic.     Right Ear: Tympanic membrane, ear canal and external  ear normal.     Left Ear: Tympanic membrane, ear canal and external ear normal.     Nose: Nose normal.     Mouth/Throat:     Mouth: Mucous membranes are moist.     Pharynx: Oropharynx is clear.  Eyes:     Extraocular Movements: Extraocular movements intact.     Conjunctiva/sclera: Conjunctivae normal.     Pupils: Pupils are equal, round, and reactive to light.  Cardiovascular:     Rate and Rhythm: Normal rate and regular rhythm.     Pulses: Normal pulses.     Heart sounds: Normal heart sounds.  Pulmonary:     Effort: Pulmonary effort is normal.     Breath sounds: Normal breath sounds.  Abdominal:     General: Bowel sounds are normal.     Palpations: Abdomen is soft.  Musculoskeletal:        General: Normal range of motion.     Cervical back: Normal range of motion and neck supple.       Feet:  Feet:     Right foot:     Protective Sensation: 10 sites tested. 2 sites sensed.     Left foot:     Protective Sensation: 2 sites tested. 10 sites sensed.  Skin:    General: Skin is warm and dry.     Capillary Refill: Capillary refill takes less than 2 seconds.  Neurological:     Mental Status: She is alert and oriented to person, place, and time.     Sensory: Sensory deficit present.  Psychiatric:        Mood and Affect: Mood normal.        Behavior: Behavior normal.     BP 112/73   Pulse 100   Temp 99.3 F (37.4 C)   Resp 18   Ht 5' (1.524 m)   Wt 137 lb (62.1 kg)   SpO2 98%   BMI 26.76 kg/m  Wt Readings from Last 3 Encounters:  02/16/20 137 lb (62.1 kg)  02/09/20 137 lb (62.1 kg)  05/18/19 138 lb (62.6 kg)     Health Maintenance Due  Topic Date Due  . OPHTHALMOLOGY EXAM  08/11/2019  . FOOT EXAM  01/14/2020    There are no preventive care reminders to display for this patient.  Lab  Results  Component Value Date   TSH 1.760 07/26/2016   Lab Results  Component Value Date   WBC 5.1 02/09/2020   HGB 12.4 02/09/2020   HCT 37.7 02/09/2020   MCV 87 02/09/2020   PLT 254 02/09/2020   Lab Results  Component Value Date   NA 140 02/09/2020   K 4.4 02/09/2020   CO2 23 02/09/2020   GLUCOSE 127 (H) 02/09/2020   BUN 15 02/09/2020   CREATININE 0.69 02/09/2020   BILITOT <0.2 02/09/2020   ALKPHOS 71 02/09/2020   AST 14 02/09/2020   ALT 13 02/09/2020   PROT 6.9 02/09/2020   ALBUMIN 4.2 02/09/2020   CALCIUM 9.8 02/09/2020   ANIONGAP 10 04/30/2018   Lab Results  Component Value Date   CHOL 230 (H) 02/09/2020   Lab Results  Component Value Date   HDL 86 02/09/2020   Lab Results  Component Value Date   LDLCALC 135 (H) 02/09/2020   Lab Results  Component Value Date   TRIG 55 02/09/2020   Lab Results  Component Value Date   CHOLHDL 2.1 06/09/2018   Lab Results  Component Value Date   HGBA1C 6.5 (  H) 02/09/2020      Assessment & Plan:   Problem List Items Addressed This Visit      Cardiovascular and Mediastinum   Migraine    -no issues today -takes naproxen 500 mg PRN      Relevant Medications   atorvastatin (LIPITOR) 40 MG tablet   pregabalin (LYRICA) 50 MG capsule     Digestive   GERD (gastroesophageal reflux disease)    -no issues today -continue omeprazole        Endocrine   Hyperlipidemia associated with type 2 diabetes mellitus ([place])    -increased her dose of atorvastatin today -will recheck labs in 3 months      Relevant Medications   atorvastatin (LIPITOR) 40 MG tablet   Other Relevant Orders   Lipid Panel With LDL/HDL Ratio   Diabetic peripheral neuropathy ([place])    Foot exam performed today -she had 2/10 sensation in bilateral feet -she has tried gabapentin and effexor in the past -Rx. lyrica today -if no improvement, will consider neuro consult -for blood sugar control, she is using jardiance, metformin, and  pioglitazone -A1c was at goal today -on ACE-I and statin      Relevant Medications   atorvastatin (LIPITOR) 40 MG tablet   pregabalin (LYRICA) 50 MG capsule   Other Relevant Orders   CBC with Differential/Platelet   CMP14+EGFR   Hemoglobin A1c     Other   GAD (generalized anxiety disorder) - Primary    -no issues today -managed by [name]      Chronic low back pain    -followed by pain management, Dr. Royce Macadamia in [place]      Moderate single current episode of major depressive disorder [facility])    -followed by [name] -taking effexor         Meds ordered this encounter  Medications  . atorvastatin (LIPITOR) 40 MG tablet    Sig: Take 1 tablet (40 mg total) by mouth daily.    Dispense:  90 tablet    Refill:  3  . pregabalin (LYRICA) 50 MG capsule    Sig: Take 1 capsule (50 mg total) by mouth 3 (three) times daily.    Dispense:  90 capsule    Refill:  3       Follow-up: Return in about 3 months (around 05/18/2020) for Lab follow-up.    Noreene Larsson, NP

## 2020-02-16 NOTE — Assessment & Plan Note (Signed)
-  increased her dose of atorvastatin today -will recheck labs in 3 months

## 2020-02-16 NOTE — Assessment & Plan Note (Signed)
-  followed by [name] -taking effexor

## 2020-02-16 NOTE — Assessment & Plan Note (Signed)
-  no issues today -managed by [name]

## 2020-02-16 NOTE — Assessment & Plan Note (Addendum)
Foot exam performed today -she had 2/10 sensation in bilateral feet -she has tried gabapentin and effexor in the past -Rx. lyrica today -if no improvement, will consider neuro consult -for blood sugar control, she is using jardiance, metformin, and pioglitazone -A1c was at goal today -on ACE-I and statin

## 2020-02-16 NOTE — Assessment & Plan Note (Signed)
-  no issues today -takes naproxen 500 mg PRN

## 2020-02-18 ENCOUNTER — Other Ambulatory Visit: Payer: Self-pay | Admitting: Family

## 2020-02-18 DIAGNOSIS — E1140 Type 2 diabetes mellitus with diabetic neuropathy, unspecified: Secondary | ICD-10-CM | POA: Diagnosis not present

## 2020-02-18 DIAGNOSIS — Z79899 Other long term (current) drug therapy: Secondary | ICD-10-CM | POA: Diagnosis not present

## 2020-02-18 DIAGNOSIS — M545 Low back pain, unspecified: Secondary | ICD-10-CM | POA: Diagnosis not present

## 2020-02-18 DIAGNOSIS — E1142 Type 2 diabetes mellitus with diabetic polyneuropathy: Secondary | ICD-10-CM

## 2020-02-18 DIAGNOSIS — G8929 Other chronic pain: Secondary | ICD-10-CM | POA: Diagnosis not present

## 2020-02-23 DIAGNOSIS — Z419 Encounter for procedure for purposes other than remedying health state, unspecified: Secondary | ICD-10-CM | POA: Diagnosis not present

## 2020-03-10 ENCOUNTER — Other Ambulatory Visit (HOSPITAL_COMMUNITY): Payer: Self-pay | Admitting: Psychiatry

## 2020-03-20 ENCOUNTER — Telehealth (HOSPITAL_COMMUNITY): Payer: Medicaid Other | Admitting: Psychiatry

## 2020-03-20 DIAGNOSIS — I10 Essential (primary) hypertension: Secondary | ICD-10-CM | POA: Diagnosis not present

## 2020-03-20 DIAGNOSIS — G8929 Other chronic pain: Secondary | ICD-10-CM | POA: Diagnosis not present

## 2020-03-20 DIAGNOSIS — M542 Cervicalgia: Secondary | ICD-10-CM | POA: Diagnosis not present

## 2020-03-20 DIAGNOSIS — Z79899 Other long term (current) drug therapy: Secondary | ICD-10-CM | POA: Diagnosis not present

## 2020-03-20 DIAGNOSIS — E1140 Type 2 diabetes mellitus with diabetic neuropathy, unspecified: Secondary | ICD-10-CM | POA: Diagnosis not present

## 2020-03-20 DIAGNOSIS — M549 Dorsalgia, unspecified: Secondary | ICD-10-CM | POA: Diagnosis not present

## 2020-03-20 DIAGNOSIS — M545 Low back pain, unspecified: Secondary | ICD-10-CM | POA: Diagnosis not present

## 2020-03-21 ENCOUNTER — Other Ambulatory Visit: Payer: Self-pay

## 2020-03-21 ENCOUNTER — Other Ambulatory Visit: Payer: Self-pay | Admitting: Nurse Practitioner

## 2020-03-21 ENCOUNTER — Telehealth: Payer: Self-pay

## 2020-03-21 ENCOUNTER — Emergency Department (HOSPITAL_COMMUNITY): Payer: Medicaid Other

## 2020-03-21 ENCOUNTER — Emergency Department (HOSPITAL_COMMUNITY)
Admission: EM | Admit: 2020-03-21 | Discharge: 2020-03-21 | Disposition: A | Discharge status: Home / Self Care | Payer: Medicaid Other | Attending: Emergency Medicine | Admitting: Emergency Medicine

## 2020-03-21 ENCOUNTER — Encounter (HOSPITAL_COMMUNITY): Payer: Self-pay | Admitting: Emergency Medicine

## 2020-03-21 DIAGNOSIS — R109 Unspecified abdominal pain: Secondary | ICD-10-CM | POA: Diagnosis not present

## 2020-03-21 DIAGNOSIS — R1013 Epigastric pain: Secondary | ICD-10-CM | POA: Diagnosis not present

## 2020-03-21 DIAGNOSIS — I10 Essential (primary) hypertension: Secondary | ICD-10-CM | POA: Diagnosis not present

## 2020-03-21 DIAGNOSIS — E119 Type 2 diabetes mellitus without complications: Secondary | ICD-10-CM | POA: Diagnosis not present

## 2020-03-21 DIAGNOSIS — Z20822 Contact with and (suspected) exposure to covid-19: Secondary | ICD-10-CM | POA: Insufficient documentation

## 2020-03-21 DIAGNOSIS — Z7722 Contact with and (suspected) exposure to environmental tobacco smoke (acute) (chronic): Secondary | ICD-10-CM | POA: Diagnosis not present

## 2020-03-21 DIAGNOSIS — E1142 Type 2 diabetes mellitus with diabetic polyneuropathy: Secondary | ICD-10-CM

## 2020-03-21 DIAGNOSIS — J45909 Unspecified asthma, uncomplicated: Secondary | ICD-10-CM | POA: Diagnosis not present

## 2020-03-21 DIAGNOSIS — R112 Nausea with vomiting, unspecified: Secondary | ICD-10-CM | POA: Insufficient documentation

## 2020-03-21 DIAGNOSIS — Z79899 Other long term (current) drug therapy: Secondary | ICD-10-CM | POA: Diagnosis not present

## 2020-03-21 DIAGNOSIS — J449 Chronic obstructive pulmonary disease, unspecified: Secondary | ICD-10-CM | POA: Diagnosis not present

## 2020-03-21 DIAGNOSIS — Z7984 Long term (current) use of oral hypoglycemic drugs: Secondary | ICD-10-CM | POA: Insufficient documentation

## 2020-03-21 DIAGNOSIS — Z7982 Long term (current) use of aspirin: Secondary | ICD-10-CM | POA: Insufficient documentation

## 2020-03-21 DIAGNOSIS — R1110 Vomiting, unspecified: Secondary | ICD-10-CM | POA: Diagnosis not present

## 2020-03-21 LAB — COMPREHENSIVE METABOLIC PANEL
ALT: 19 U/L (ref 0–44)
AST: 23 U/L (ref 15–41)
Albumin: 3.9 g/dL (ref 3.5–5.0)
Alkaline Phosphatase: 49 U/L (ref 38–126)
Anion gap: 10 (ref 5–15)
BUN: 25 mg/dL — ABNORMAL HIGH (ref 6–20)
CO2: 27 mmol/L (ref 22–32)
Calcium: 9.6 mg/dL (ref 8.9–10.3)
Chloride: 99 mmol/L (ref 98–111)
Creatinine, Ser: 0.65 mg/dL (ref 0.44–1.00)
GFR, Estimated: >60 mL/min (ref >60)
Glucose, Bld: 88 mg/dL (ref 70–99)
Potassium: 4.3 mmol/L (ref 3.5–5.1)
Sodium: 136 mmol/L (ref 135–145)
Total Bilirubin: 0.4 mg/dL (ref 0.3–1.2)
Total Protein: 7.2 g/dL (ref 6.5–8.1)

## 2020-03-21 LAB — URINALYSIS, ROUTINE W REFLEX MICROSCOPIC
Bilirubin Urine: NEGATIVE
Glucose, UA: >=500 mg/dL — AB
Hgb urine dipstick: NEGATIVE
Ketones, ur: NEGATIVE mg/dL
Nitrite: NEGATIVE
Protein, ur: NEGATIVE mg/dL
Specific Gravity, Urine: 1.025 (ref 1.005–1.030)
pH: 5 (ref 5.0–8.0)

## 2020-03-21 LAB — CBC
HCT: 39.1 % (ref 36.0–46.0)
Hemoglobin: 12 g/dL (ref 12.0–15.0)
MCH: 28.1 pg (ref 26.0–34.0)
MCHC: 30.7 g/dL (ref 30.0–36.0)
MCV: 91.6 fL (ref 80.0–100.0)
Platelets: 205 10*3/uL (ref 150–400)
RBC: 4.27 MIL/uL (ref 3.87–5.11)
RDW: 13.2 % (ref 11.5–15.5)
WBC: 3.9 10*3/uL — ABNORMAL LOW (ref 4.0–10.5)
nRBC: 0 % (ref 0.0–0.2)

## 2020-03-21 LAB — LIPASE, BLOOD: Lipase: 26 U/L (ref 11–51)

## 2020-03-21 LAB — RESP PANEL BY RT-PCR (FLU A&B, COVID) ARPGX2
Influenza A by PCR: NEGATIVE
Influenza B by PCR: NEGATIVE
SARS Coronavirus 2 by RT PCR: NEGATIVE

## 2020-03-21 MED ORDER — ONDANSETRON 4 MG PO TBDP: 4.0000 mg | ORAL_TABLET | Freq: Three times a day (TID) | ORAL | 0 refills | Status: DC | PRN

## 2020-03-21 MED ORDER — PIOGLITAZONE HCL 15 MG PO TABS: ORAL_TABLET | ORAL | 0 refills | Status: DC

## 2020-03-21 MED ORDER — SODIUM CHLORIDE 0.9 % IV BOLUS
1000.0000 mL | Freq: Once | INTRAVENOUS | Status: AC
  Administered 2020-03-21: 14:00:00 1000 mL via INTRAVENOUS

## 2020-03-21 MED ORDER — FENTANYL CITRATE (PF) 100 MCG/2ML IJ SOLN
50.0000 ug | Freq: Once | INTRAMUSCULAR | Status: AC
  Administered 2020-03-21: 15:00:00 50 ug via INTRAVENOUS
  Filled 2020-03-21: qty 2

## 2020-03-21 MED ORDER — ONDANSETRON HCL 4 MG/2ML IJ SOLN
4.0000 mg | Freq: Once | INTRAMUSCULAR | Status: AC
  Administered 2020-03-21: 14:00:00 4 mg via INTRAVENOUS
  Filled 2020-03-21: qty 2

## 2020-03-21 MED ORDER — PANTOPRAZOLE SODIUM 40 MG IV SOLR
40.0000 mg | Freq: Once | INTRAVENOUS | Status: AC
  Administered 2020-03-21: 14:00:00 40 mg via INTRAVENOUS
  Filled 2020-03-21: qty 40

## 2020-03-21 NOTE — ED Triage Notes (Signed)
Pt c/o abdominal pain that began last night with N/V/D.

## 2020-03-21 NOTE — ED Provider Notes (Signed)
[facility] EMERGENCY DEPARTMENT Provider Note  CSN: [id number] Arrival date & time: 03/21/20 1119    History Chief Complaint  Patient presents with  . Abdominal Pain    HPI  Charlotte Perez is a 59 y.o. female with history of DM reports she has been feeling bad since yesterday evening with epigastric discomfort, nausea and vomiting. Not associated with fevers. Denies diarrhea to me. She has had ulcers in the distant past. Denies alcohol or drug use. Has had peripheral neuropathy with her DM but no reported gastroparesis.    Past Medical History:  Diagnosis Date  . 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 Arthritis   . Asthma   . Bipolar disorder ([place])   . Chronic back pain   . Chronic neck pain   . Constipation 09/05/2016  . COPD (chronic obstructive pulmonary disease) ([place])   . DDD (degenerative disc disease), cervical   . DDD (degenerative disc disease), lumbar   . Depression   . Diabetes mellitus ([place]) 06/27/2010  . Diabetes mellitus, type II ([place])   . Gallbladder sludge   . GERD (gastroesophageal reflux disease)   . Hyperlipidemia   . Hypertension   . Iron deficiency anemia 09/05/2016  . Lumbar radiculopathy   . Mole (skin)   . Neuropathy of foot   . Renal cyst   . Shingles   . Skin lesions, generalized    1.2 CM FLAT FAWN COLOR AT THE T10 AREA JUST RIGHT OF SPINE     Past Surgical History:  Procedure Laterality Date  . ABDOMINAL HYSTERECTOMY     total hysterectomy  . BACK SURGERY    . CESAREAN SECTION     x 2  . NECK SURGERY      Family History  Problem Relation Age of Onset  . ADD / ADHD Other   . COPD Father   . Alcohol abuse Father   . Depression Daughter   . Alcohol abuse Mother   . COPD Mother        on oxygen  . Colon cancer Paternal Grandfather   . Heart disease Sister   . Heart disease Brother   . Arthritis Sister        knee replacement  . Diabetes Maternal Grandmother   . Diabetes Sister     Social  History   Tobacco Use  . Smoking status: Passive Smoke Exposure - Never Smoker  . Smokeless tobacco: Never Used  Vaping Use  . Vaping Use: Never used  Substance Use Topics  . Alcohol use: No  . Drug use: No     Home Medications Prior to Admission medications   Medication Sig Start Date End Date Taking? Authorizing Provider  ACCU-CHEK FASTCLIX LANCETS MISC EVERY DAY 04/14/17   Evelina Dun A, FNP  albuterol (PROVENTIL) (2.5 MG/3ML) 0.083% nebulizer solution Take 3 mLs (2.5 mg total) by nebulization every 6 (six) hours as needed for wheezing or shortness of breath. 04/01/18   Dettinger, Fransisca Kaufmann, MD  ALPRAZolam Duanne Moron) 1 MG tablet TAKE ONE TABLET AT BEDTIME AS NEEDED 03/20/20   Cloria Spring, MD  Ascorbic Acid (VITAMIN C PO) Take by mouth. ImmuBlast    [provider]  aspirin 81 MG EC tablet Take 1 tablet (81 mg total) by mouth daily. Swallow whole. 10/12/18   Satira Sark, MD  atorvastatin (LIPITOR) 40 MG tablet Take 1 tablet (40 mg total) by mouth daily. 02/16/20   Noreene Larsson, NP  cycloSPORINE (RESTASIS)  0.05 % ophthalmic emulsion 1 drop 2 (two) times daily.    [provider]  glucose blood (ACCU-CHEK GUIDE) test strip Use daily Dx E11.9 02/09/20   Noreene Larsson, NP  JARDIANCE 10 MG TABS tablet TAKE ONE (1) TABLET EACH DAY 05/24/19   Evelina Dun A, FNP  lisinopril (ZESTRIL) 5 MG tablet TAKE ONE (1) TABLET EACH DAY 09/21/19   Evelina Dun A, FNP  metFORMIN (GLUCOPHAGE) 1000 MG tablet TAKE ONE TABLET TWICE A DAY WITH FOOD 08/03/19   Evelina Dun A, FNP  naproxen (NAPROSYN) 500 MG tablet TAKE ONE TABLET TWICE A DAY WITH FOOD 03/12/19   Evelina Dun A, FNP  omeprazole (PRILOSEC) 20 MG capsule TAKE ONE (1) CAPSULE EACH DAY 03/01/19   Evelina Dun A, FNP  OXYCODONE ER PO Take by mouth.    [provider]  pioglitazone (ACTOS) 15 MG tablet TAKE ONE (1) TABLET EACH DAY 07/08/19   Hawks, Christy A, FNP  pregabalin (LYRICA) 50 MG capsule Take 1  capsule (50 mg total) by mouth 3 (three) times daily. 02/16/20   Noreene Larsson, NP  venlafaxine XR (EFFEXOR-XR) 150 MG 24 hr capsule Take 2 capsules (300 mg total) by mouth at bedtime. 12/22/19   Cloria Spring, MD     Allergies    Cymbalta [duloxetine hcl], Gabapentin, Meloxicam, and Tramadol   Review of Systems   Review of Systems A comprehensive review of systems was completed and negative except as noted in HPI.    Physical Exam BP 115/72 (BP Location: Right Arm)   Pulse 93   Temp 98.9 F (37.2 C) (Oral)   Resp 18   Ht 5' (1.524 m)   Wt 64 kg   SpO2 99%   BMI 27.54 kg/m   Physical Exam Vitals and nursing note reviewed.  Constitutional:      Appearance: Normal appearance.  HENT:     Head: Normocephalic and atraumatic.     Nose: Nose normal.     Mouth/Throat:     Mouth: Mucous membranes are moist.  Eyes:     Extraocular Movements: Extraocular movements intact.     Conjunctiva/sclera: Conjunctivae normal.  Cardiovascular:     Rate and Rhythm: Normal rate.  Pulmonary:     Effort: Pulmonary effort is normal.     Breath sounds: Normal breath sounds.  Abdominal:     General: Abdomen is flat.     Palpations: Abdomen is soft.     Tenderness: There is abdominal tenderness in the epigastric area. There is no guarding. Negative signs include Murphy's sign and McBurney's sign.  Musculoskeletal:        General: No swelling. Normal range of motion.     Cervical back: Neck supple.  Skin:    General: Skin is warm and dry.  Neurological:     General: No focal deficit present.     Mental Status: She is alert.  Psychiatric:        Mood and Affect: Mood normal.      ED Results / Procedures / Treatments   Labs (all labs ordered are listed, but only abnormal results are displayed) Labs Reviewed  URINALYSIS, ROUTINE W REFLEX MICROSCOPIC - Abnormal; Notable for the following components:      Result Value   APPearance HAZY (*)    Glucose, UA >=500 (*)    Leukocytes,Ua  SMALL (*)    Bacteria, UA RARE (*)    All other components within normal limits  RESP PANEL BY  RT-PCR (FLU A&B, COVID) ARPGX2  LIPASE, BLOOD  COMPREHENSIVE METABOLIC PANEL  CBC    EKG None  Radiology No results found.  Procedures Procedures  Medications Ordered in the ED Medications  ondansetron (ZOFRAN) injection 4 mg (has no administration in time range)  sodium chloride 0.9 % bolus 1,000 mL (has no administration in time range)     MDM Rules/Calculators/A&P MDM Epigastric pain with vomiting. No hematemesis or melena reported. Exam not concerning for acute cholecystitis, will check labs, given IVF, Zofran and PPO. ED Course  I have reviewed the triage vital signs and the nursing notes.  Pertinent labs & imaging results that were available during my care of the patient were reviewed by me and considered in my medical decision making (see chart for details).  Clinical Course as of 03/22/20 0659  Tue Mar 21, 2020  1441 CBC with mild leukopenia, otherwise normal. No diff done. Patient reports improved nausea but still having pain. She reports she was recently switched from Norco 5/235 BID to Norco 10/325 TID by her pain management but hasn't started the new dose yet (per PDMP filled yesterday). Our CT scanner is currently down for repairs. Will send for [place] given continued pain. Fentanyl for comfort.  [CS]  1456 Care of the patient signed out to Dr. Gardner Candle at the change of shift.  [CS]    Clinical Course User Index [CS] Pollyann Savoy, MD    Final Clinical Impression(s) / ED Diagnoses Final diagnoses:  None    Rx / DC Orders ED Discharge Orders    None       Pollyann Savoy, MD 03/22/20 0700

## 2020-03-21 NOTE — Telephone Encounter (Signed)
What needs to be refilled?

## 2020-03-21 NOTE — Telephone Encounter (Signed)
Actos sent in.

## 2020-03-21 NOTE — Discharge Instructions (Addendum)
You were seen in the emergency department for upper abdominal pain.  You also said you had a lot of gas and incomplete emptying of your bowels.  You had blood work here and an ultrasound of your right upper quadrant that did not show an obvious explanation for your symptoms.  Please double your omeprazole to help with acid.  We are also giving you a prescription for some nausea medicine.  Please contact gastroenterology for follow-up.  Return to the emergency department for any worsening or concerning symptoms

## 2020-03-21 NOTE — ED Provider Notes (Signed)
Signout from Dr. Bernette Mayers.  59 year old female here with abdominal pain since last evening.  Plan is to follow-up on right upper quadrant ultrasound and CMP. Physical Exam  BP 112/72   Pulse 83   Temp 98.9 F (37.2 C) (Oral)   Resp 18   Ht 5' (1.524 m)   Wt 64 kg   SpO2 99%   BMI 27.54 kg/m   Physical Exam  ED Course/Procedures   Clinical Course as of 03/21/20 1525  Tue Mar 21, 2020  1441 CBC with mild leukopenia, otherwise normal. No diff done. Patient reports improved nausea but still having pain. She reports she was recently switched from Norco 5/235 BID to Norco 10/325 TID by her pain management but hasn't started the new dose yet (per PDMP filled yesterday). Our CT scanner is currently down for repairs. Will send for [place] given continued pain. Fentanyl for comfort.  [CS]  1456 Care of the patient signed out to Dr. Gardner Candle at the change of shift.  [CS]    Clinical Course User Index [CS] Pollyann Savoy, MD    Procedures  MDM  Her ultrasound does not show any gallstones or acute findings.  CMP unremarkable.  Patient just received some medication.  She tells me she does not have complete bowel movements and she has been very gassy.  She does not have a GI doctor so we will refer her onto 1.  She is already on a PPI.  She had a bowel movement here.  She feels somewhat better after medication.  She is on omeprazole so will help Dilaudid and give her a prescription for some Zofran.  Also give her Dr. Patty Sermons office information.  She is comfortable with plan.  Return instructions discussed      Terrilee Files, MD 03/22/20 1214

## 2020-03-22 ENCOUNTER — Encounter (HOSPITAL_COMMUNITY): Payer: Self-pay | Admitting: Psychiatry

## 2020-03-22 ENCOUNTER — Telehealth (INDEPENDENT_AMBULATORY_CARE_PROVIDER_SITE_OTHER): Payer: Medicaid Other | Admitting: Psychiatry

## 2020-03-22 DIAGNOSIS — F321 Major depressive disorder, single episode, moderate: Secondary | ICD-10-CM | POA: Diagnosis not present

## 2020-03-22 MED ORDER — VENLAFAXINE HCL ER 150 MG PO CP24: 300.0000 mg | ORAL_CAPSULE | Freq: Every day | ORAL | 2 refills | Status: DC

## 2020-03-22 MED ORDER — ALPRAZOLAM 1 MG PO TABS: 1.0000 mg | ORAL_TABLET | Freq: Every evening | ORAL | 2 refills | Status: DC | PRN

## 2020-03-22 NOTE — Progress Notes (Signed)
Virtual Visit via Telephone Note  I connected with Charlotte Perez on 03/22/20 at  9:00 AM EST by telephone and verified that I am speaking with the correct person using two identifiers.  Location: Patient: home Provider:home   I discussed the limitations, risks, security and privacy concerns of performing an evaluation and management service by telephone and the availability of in person appointments. I also discussed with the patient that there may be a patient responsible charge related to this service. The patient expressed understanding and agreed to proceed.    I discussed the assessment and treatment plan with the patient. The patient was provided an opportunity to ask questions and all were answered. The patient agreed with the plan and demonstrated an understanding of the instructions.   The patient was advised to call back or seek an in-person evaluation if the symptoms worsen or if the condition fails to improve as anticipated.  I provided 15 minutes of non-face-to-face time during this encounter.   [name], MD  [facility] MD/PA/NP OP Progress Note  03/22/2020 9:18 AM Charlotte Perez  MRN:  [id number]  Chief Complaint:  Chief Complaint    Anxiety; Depression; Follow-up     HPI: this patient is a 59 year old married white female who lives with her husband in [place]. She has 3 grown children from her first marriage and 8 grandchildren. She is on disability  The patient returns for follow-up after 3 months.  She states that she is having a lot of abdominal pain.  In fact she was seen in the emergency room yesterday and had a normal ultrasound.  She was given some Zofran.  She recently had an increase in her pain medicine which may be causing some discomfort and she also was recently prescribed naproxen which may be causing some gastric distress.  I advised her to see a GI specialist.  Overall however her mood has been stable and she denies significant depression anxiety or  problems sleeping.  She denies suicidal ideation. Visit Diagnosis:    ICD-10-CM   1. Moderate single current episode of major depressive disorder ([place])  F32.1     Past Psychiatric History: Long-term outpatient treatment  Past Medical History:  Past Medical History:  Diagnosis Date  . 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 Arthritis   . Asthma   . Bipolar disorder ([place])   . Chronic back pain   . Chronic neck pain   . Constipation 09/05/2016  . COPD (chronic obstructive pulmonary disease) ([place])   . DDD (degenerative disc disease), cervical   . DDD (degenerative disc disease), lumbar   . Depression   . Diabetes mellitus ([place]) 06/27/2010  . Diabetes mellitus, type II ([place])   . Gallbladder sludge   . GERD (gastroesophageal reflux disease)   . Hyperlipidemia   . Hypertension   . Iron deficiency anemia 09/05/2016  . Lumbar radiculopathy   . Mole (skin)   . Neuropathy of foot   . Renal cyst   . Shingles   . Skin lesions, generalized    1.2 CM FLAT FAWN COLOR AT THE T10 AREA JUST RIGHT OF SPINE     Past Surgical History:  Procedure Laterality Date  . ABDOMINAL HYSTERECTOMY     total hysterectomy  . BACK SURGERY    . CESAREAN SECTION     x 2  . NECK SURGERY      Family Psychiatric History: see below  Family History:  Family History  Problem Relation Age  of Onset  . ADD / ADHD Other   . COPD Father   . Alcohol abuse Father   . Depression Daughter   . Alcohol abuse Mother   . COPD Mother        on oxygen  . Colon cancer Paternal Grandfather   . Heart disease Sister   . Heart disease Brother   . Arthritis Sister        knee replacement  . Diabetes Maternal Grandmother   . Diabetes Sister     Social History:  Social History   Socioeconomic History  . Marital status: Married    Spouse name: Christen Bame  . Number of children: 3  . Years of education: Not on file  . Highest education level: Not on file  Occupational History  .  Occupation: disablility  Tobacco Use  . Smoking status: Passive Smoke Exposure - Never Smoker  . Smokeless tobacco: Never Used  Vaping Use  . Vaping Use: Never used  Substance and Sexual Activity  . Alcohol use: No  . Drug use: No  . Sexual activity: Yes    Birth control/protection: Surgical  Other Topics Concern  . Not on file  Social History Narrative  . Not on file   Social Determinants of Health   Financial Resource Strain: Not on file  Food Insecurity: Not on file  Transportation Needs: Not on file  Physical Activity: Not on file  Stress: Not on file  Social Connections: Not on file    Allergies:  Allergies  Allergen Reactions  . Cymbalta [Duloxetine Hcl]     Per pt it started making her mouth tingle and could not taste anything  . Gabapentin     Itching, fidgety  . Meloxicam Other (See Comments)    Tongue tingling, and lost sense of taste.  . Tramadol Other (See Comments)    Tongue tingling, and lost sense of taste.    Metabolic Disorder Labs: Lab Results  Component Value Date   HGBA1C 6.5 (H) 02/09/2020   No results found for: PROLACTIN Lab Results  Component Value Date   CHOL 230 (H) 02/09/2020   TRIG 55 02/09/2020   HDL 86 02/09/2020   CHOLHDL 2.1 06/09/2018   LDLCALC 135 (H) 02/09/2020   LDLCALC 71 06/09/2018   Lab Results  Component Value Date   TSH 1.760 07/26/2016   TSH 1.300 07/20/2014    Therapeutic Level Labs: No results found for: LITHIUM No results found for: VALPROATE No components found for:  CBMZ  Current Medications: Current Outpatient Medications  Medication Sig Dispense Refill  . ACCU-CHEK FASTCLIX LANCETS MISC EVERY DAY 102 each 2  . albuterol (PROVENTIL) (2.5 MG/3ML) 0.083% nebulizer solution Take 3 mLs (2.5 mg total) by nebulization every 6 (six) hours as needed for wheezing or shortness of breath. 75 mL 3  . ALPRAZolam (XANAX) 1 MG tablet Take 1 tablet (1 mg total) by mouth at bedtime as needed. 30 tablet 2  . Ascorbic  Acid (VITAMIN C PO) Take by mouth. ImmuBlast    . aspirin 81 MG EC tablet Take 1 tablet (81 mg total) by mouth daily. Swallow whole.    Marland Kitchen atorvastatin (LIPITOR) 40 MG tablet Take 1 tablet (40 mg total) by mouth daily. 90 tablet 3  . cycloSPORINE (RESTASIS) 0.05 % ophthalmic emulsion 1 drop 2 (two) times daily.    Marland Kitchen glucose blood (ACCU-CHEK GUIDE) test strip Use daily Dx E11.9 100 each 3  . JARDIANCE 10 MG TABS tablet TAKE ONE (1)  TABLET EACH DAY 90 tablet 0  . lisinopril (ZESTRIL) 5 MG tablet TAKE ONE (1) TABLET EACH DAY 90 tablet 0  . metFORMIN (GLUCOPHAGE) 1000 MG tablet TAKE ONE TABLET TWICE A DAY WITH FOOD 180 tablet 0  . naproxen (NAPROSYN) 500 MG tablet TAKE ONE TABLET TWICE A DAY WITH FOOD 60 tablet 0  . omeprazole (PRILOSEC) 20 MG capsule TAKE ONE (1) CAPSULE EACH DAY 90 capsule 1  . ondansetron (ZOFRAN-ODT) 4 MG disintegrating tablet Take 1 tablet (4 mg total) by mouth every 8 (eight) hours as needed for nausea or vomiting. 20 tablet 0  . OXYCODONE ER PO Take by mouth.    . pioglitazone (ACTOS) 15 MG tablet TAKE ONE (1) TABLET EACH DAY 90 tablet 0  . pregabalin (LYRICA) 50 MG capsule Take 1 capsule (50 mg total) by mouth 3 (three) times daily. 90 capsule 3  . venlafaxine XR (EFFEXOR-XR) 150 MG 24 hr capsule Take 2 capsules (300 mg total) by mouth at bedtime. 60 capsule 2   No current facility-administered medications for this visit.     Musculoskeletal: Strength & Muscle Tone: within normal limits Gait & Station: normal Patient leans: N/A  Psychiatric Specialty Exam: Review of Systems  Gastrointestinal: Positive for abdominal pain and nausea.  Musculoskeletal: Positive for back pain.  All other systems reviewed and are negative.   There were no vitals taken for this visit.There is no height or weight on file to calculate BMI.  General Appearance: NA  Eye Contact:  NA  Speech:  Clear and Coherent  Volume:  Normal  Mood:  Euthymic  Affect:  NA  Thought Process:  Goal  Directed  Orientation:  Full (Time, Place, and Person)  Thought Content: NA   Suicidal Thoughts:  No  Homicidal Thoughts:  No  Memory:  Immediate;   Good Recent;   Good Remote;   Good  Judgement:  Good  Insight:  Fair  Psychomotor Activity:  Decreased  Concentration:  Concentration: Good and Attention Span: Good  Recall:  Good  Fund of Knowledge: Fair  Language: Good  Akathisia:  No  Handed:  Right  AIMS (if indicated): not done  Assets:  Communication Skills Desire for Improvement Resilience Social Support Talents/Skills  ADL's:  Intact  Cognition: WNL  Sleep:  Good   Screenings: PHQ2-9   [place] Office Visit from 02/16/2020 in [facility] Office Visit from 02/09/2020 in [facility] Office Visit from 02/01/2019 in [facility] Visit from 11/05/2018 in [facility] Visit from 06/09/2018 in [facility]  PHQ-2 Total Score 2 2 3  0 0  PHQ-9 Total Score 13 6 10  -- --       Assessment and Plan:  This patient is a 59 year old female with a history of depression and anxiety.  She has had more physical ailments lately but for the most part her mood has been stable.  She will continue Effexor XR 300 mg daily for depression and Xanax 1 mg at bedtime for anxiety and sleep.  She will return to see me in 3 months  [name], MD 03/22/2020, 9:18 AM

## 2020-03-25 DIAGNOSIS — Z419 Encounter for procedure for purposes other than remedying health state, unspecified: Secondary | ICD-10-CM | POA: Diagnosis not present

## 2020-04-03 ENCOUNTER — Other Ambulatory Visit: Payer: Self-pay

## 2020-04-03 DIAGNOSIS — E1142 Type 2 diabetes mellitus with diabetic polyneuropathy: Secondary | ICD-10-CM

## 2020-04-03 DIAGNOSIS — E1165 Type 2 diabetes mellitus with hyperglycemia: Secondary | ICD-10-CM

## 2020-04-03 MED ORDER — METFORMIN HCL 1000 MG PO TABS: ORAL_TABLET | ORAL | 0 refills | Status: DC

## 2020-04-03 MED ORDER — PIOGLITAZONE HCL 15 MG PO TABS: ORAL_TABLET | ORAL | 0 refills | Status: DC

## 2020-04-03 MED ORDER — LISINOPRIL 5 MG PO TABS: ORAL_TABLET | ORAL | 0 refills | Status: DC

## 2020-04-07 ENCOUNTER — Other Ambulatory Visit: Payer: Self-pay

## 2020-04-07 ENCOUNTER — Telehealth: Payer: Self-pay

## 2020-04-07 ENCOUNTER — Telehealth: Payer: Self-pay | Admitting: Nurse Practitioner

## 2020-04-07 DIAGNOSIS — E1142 Type 2 diabetes mellitus with diabetic polyneuropathy: Secondary | ICD-10-CM

## 2020-04-07 MED ORDER — EMPAGLIFLOZIN 10 MG PO TABS: ORAL_TABLET | ORAL | 0 refills | Status: DC

## 2020-04-07 NOTE — Telephone Encounter (Signed)
Rx refilled.

## 2020-04-07 NOTE — Telephone Encounter (Signed)
Med refill Jardiance 10 mg  [facility]

## 2020-04-07 NOTE — Telephone Encounter (Signed)
error 

## 2020-04-14 ENCOUNTER — Ambulatory Visit: Payer: Medicaid Other | Admitting: Nurse Practitioner

## 2020-04-14 ENCOUNTER — Other Ambulatory Visit: Payer: Self-pay

## 2020-04-14 ENCOUNTER — Encounter: Payer: Self-pay | Admitting: Nurse Practitioner

## 2020-04-14 DIAGNOSIS — R109 Unspecified abdominal pain: Secondary | ICD-10-CM | POA: Insufficient documentation

## 2020-04-14 DIAGNOSIS — R1013 Epigastric pain: Secondary | ICD-10-CM | POA: Diagnosis not present

## 2020-04-14 DIAGNOSIS — R1010 Upper abdominal pain, unspecified: Secondary | ICD-10-CM | POA: Insufficient documentation

## 2020-04-14 MED ORDER — OMEPRAZOLE 40 MG PO CPDR: 40.0000 mg | DELAYED_RELEASE_CAPSULE | Freq: Every day | ORAL | 1 refills | Status: DC

## 2020-04-14 NOTE — Progress Notes (Signed)
Acute Office Visit  Subjective:    Patient ID: Charlotte Perez, female    DOB: 1960/08/09, 60 y.o.   MRN: [id number]  Chief Complaint  Patient presents with  . Abdominal Pain    Intermittent x 1 month. Sharp pains, bloating, swelling     HPI Patient is in today for abdominal pain.  She was seen in the ED on 03/21/20 for epigastric pain. She had been taking PPI and was feeling gassy. She felt better after a BM in the ED.  She had a plan to go to Dr. Laural Golden, but she has not seen him.  She rater her pain at 8.5 out of 10. Denies dark tarry stools or blood in stool except if she is straining to have BMs.  She is taking omeprazole currently.  She does not notice any difference in her pain before or after she eats.  Past Medical History:  Diagnosis Date  . 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 Arthritis   . Asthma   . Bipolar disorder ([place])   . Chronic back pain   . Chronic neck pain   . Constipation 09/05/2016  . COPD (chronic obstructive pulmonary disease) ([place])   . DDD (degenerative disc disease), cervical   . DDD (degenerative disc disease), lumbar   . Depression   . Diabetes mellitus ([place]) 06/27/2010  . Diabetes mellitus, type II ([place])   . Gallbladder sludge   . GERD (gastroesophageal reflux disease)   . Hyperlipidemia   . Hypertension   . Iron deficiency anemia 09/05/2016  . Lumbar radiculopathy   . Mole (skin)   . Neuropathy of foot   . Renal cyst   . Shingles   . Skin lesions, generalized    1.2 CM FLAT FAWN COLOR AT THE T10 AREA JUST RIGHT OF SPINE     Past Surgical History:  Procedure Laterality Date  . ABDOMINAL HYSTERECTOMY     total hysterectomy  . BACK SURGERY    . CESAREAN SECTION     x 2  . NECK SURGERY      Family History  Problem Relation Age of Onset  . ADD / ADHD Other   . COPD Father   . Alcohol abuse Father   . Depression Daughter   . Alcohol abuse Mother   . COPD Mother        on oxygen  . Colon cancer  Paternal Grandfather   . Heart disease Sister   . Heart disease Brother   . Arthritis Sister        knee replacement  . Diabetes Maternal Grandmother   . Diabetes Sister     Social History   Socioeconomic History  . Marital status: Married    Spouse name: [name]  . Number of children: 3  . Years of education: Not on file  . Highest education level: Not on file  Occupational History  . Occupation: disablility  Tobacco Use  . Smoking status: Passive Smoke Exposure - Never Smoker  . Smokeless tobacco: Never Used  Vaping Use  . Vaping Use: Never used  Substance and Sexual Activity  . Alcohol use: No  . Drug use: No  . Sexual activity: Yes    Birth control/protection: Surgical  Other Topics Concern  . Not on file  Social History Narrative  . Not on file   Social Determinants of Health   Financial Resource Strain: Not on file  Food Insecurity: Not on file  Transportation Needs: Not on  file  Physical Activity: Not on file  Stress: Not on file  Social Connections: Not on file  Intimate Partner Violence: Not on file    Outpatient Medications Prior to Visit  Medication Sig Dispense Refill  . ACCU-CHEK FASTCLIX LANCETS MISC EVERY DAY 102 each 2  . albuterol (PROVENTIL) (2.5 MG/3ML) 0.083% nebulizer solution Take 3 mLs (2.5 mg total) by nebulization every 6 (six) hours as needed for wheezing or shortness of breath. 75 mL 3  . ALPRAZolam (XANAX) 1 MG tablet Take 1 tablet (1 mg total) by mouth at bedtime as needed. 30 tablet 2  . Ascorbic Acid (VITAMIN C PO) Take by mouth. ImmuBlast    . aspirin 81 MG EC tablet Take 1 tablet (81 mg total) by mouth daily. Swallow whole.    Marland Kitchen atorvastatin (LIPITOR) 40 MG tablet Take 1 tablet (40 mg total) by mouth daily. 90 tablet 3  . cycloSPORINE (RESTASIS) 0.05 % ophthalmic emulsion 1 drop 2 (two) times daily.    . empagliflozin (JARDIANCE) 10 MG TABS tablet TAKE ONE (1) TABLET EACH DAY 90 tablet 0  . glucose blood (ACCU-CHEK GUIDE) test  strip Use daily Dx E11.9 100 each 3  . lisinopril (ZESTRIL) 5 MG tablet TAKE ONE (1) TABLET EACH DAY 90 tablet 0  . metFORMIN (GLUCOPHAGE) 1000 MG tablet TAKE ONE TABLET TWICE A DAY WITH FOOD 180 tablet 0  . naproxen (NAPROSYN) 500 MG tablet TAKE ONE TABLET TWICE A DAY WITH FOOD 60 tablet 0  . ondansetron (ZOFRAN-ODT) 4 MG disintegrating tablet Take 1 tablet (4 mg total) by mouth every 8 (eight) hours as needed for nausea or vomiting. 20 tablet 0  . OXYCODONE ER PO Take by mouth.    . pioglitazone (ACTOS) 15 MG tablet TAKE ONE (1) TABLET EACH DAY 90 tablet 0  . venlafaxine XR (EFFEXOR-XR) 150 MG 24 hr capsule Take 2 capsules (300 mg total) by mouth at bedtime. 60 capsule 2  . omeprazole (PRILOSEC) 20 MG capsule TAKE ONE (1) CAPSULE EACH DAY 90 capsule 1  . pregabalin (LYRICA) 50 MG capsule Take 1 capsule (50 mg total) by mouth 3 (three) times daily. (Patient not taking: Reported on 04/14/2020) 90 capsule 3   No facility-administered medications prior to visit.    Allergies  Allergen Reactions  . Cymbalta [Duloxetine Hcl]     Per pt it started making her mouth tingle and could not taste anything  . Gabapentin     Itching, fidgety  . Meloxicam Other (See Comments)    Tongue tingling, and lost sense of taste.  . Tramadol Other (See Comments)    Tongue tingling, and lost sense of taste.    Review of Systems  Constitutional: Negative.   Respiratory: Negative.   Cardiovascular: Negative.   Gastrointestinal: Positive for abdominal pain. Negative for constipation and diarrhea.       Scant blood in stool with straining       Objective:    Physical Exam Constitutional:      Appearance: She is well-developed.  Cardiovascular:     Heart sounds: Normal heart sounds.  Pulmonary:     Effort: Pulmonary effort is normal.     Breath sounds: Normal breath sounds.  Abdominal:     General: Abdomen is flat. Bowel sounds are normal.     Palpations: Abdomen is soft.     Tenderness: There is  abdominal tenderness in the right lower quadrant and epigastric area. There is no guarding or rebound. Negative signs include Murphy's  sign.  Neurological:     Mental Status: She is alert.     BP 94/63   Pulse 85   Temp 97.9 F (36.6 C)   Resp 20   Ht 5' (1.524 m)   Wt 140 lb (63.5 kg)   SpO2 97%   BMI 27.34 kg/m  Wt Readings from Last 3 Encounters:  04/14/20 140 lb (63.5 kg)  03/21/20 141 lb (64 kg)  02/16/20 137 lb (62.1 kg)    Health Maintenance Due  Topic Date Due  . OPHTHALMOLOGY EXAM  08/11/2019  . COVID-19 Vaccine (3 - Booster) 12/29/2019  . FOOT EXAM  01/14/2020    There are no preventive care reminders to display for this patient.   Lab Results  Component Value Date   TSH 1.760 07/26/2016   Lab Results  Component Value Date   WBC 3.9 (L) 03/21/2020   HGB 12.0 03/21/2020   HCT 39.1 03/21/2020   MCV 91.6 03/21/2020   PLT 205 03/21/2020   Lab Results  Component Value Date   NA 136 03/21/2020   K 4.3 03/21/2020   CO2 27 03/21/2020   GLUCOSE 88 03/21/2020   BUN 25 (H) 03/21/2020   CREATININE 0.65 03/21/2020   BILITOT 0.4 03/21/2020   ALKPHOS 49 03/21/2020   AST 23 03/21/2020   ALT 19 03/21/2020   PROT 7.2 03/21/2020   ALBUMIN 3.9 03/21/2020   CALCIUM 9.6 03/21/2020   ANIONGAP 10 03/21/2020   Lab Results  Component Value Date   CHOL 230 (H) 02/09/2020   Lab Results  Component Value Date   HDL 86 02/09/2020   Lab Results  Component Value Date   LDLCALC 135 (H) 02/09/2020   Lab Results  Component Value Date   TRIG 55 02/09/2020   Lab Results  Component Value Date   CHOLHDL 2.1 06/09/2018   Lab Results  Component Value Date   HGBA1C 6.5 (H) 02/09/2020       Assessment & Plan:   Problem List Items Addressed This Visit      Other   Abdominal pain    -refer to GI, [facility] -had CT in ED that was unremarkable -taking omeprazole currently -INCREASE omeprazole to 40 mg today      Relevant Orders   Ambulatory referral to  Gastroenterology       Meds ordered this encounter  Medications  . omeprazole (PRILOSEC) 40 MG capsule    Sig: Take 1 capsule (40 mg total) by mouth daily.    Dispense:  90 capsule    Refill:  [place], NP

## 2020-04-14 NOTE — Patient Instructions (Signed)
I sent in 40 mg of omeprazole, so this should be a dose increase today.  I also sent in the referral to Dr. Olevia Perches office.

## 2020-04-14 NOTE — Assessment & Plan Note (Signed)
-  refer to GI, Rehman -had CT in ED that was unremarkable -taking omeprazole currently -INCREASE omeprazole to 40 mg today

## 2020-04-17 ENCOUNTER — Encounter (INDEPENDENT_AMBULATORY_CARE_PROVIDER_SITE_OTHER): Payer: Self-pay | Admitting: *Deleted

## 2020-04-19 DIAGNOSIS — Z79899 Other long term (current) drug therapy: Secondary | ICD-10-CM | POA: Diagnosis not present

## 2020-04-19 DIAGNOSIS — M545 Low back pain, unspecified: Secondary | ICD-10-CM | POA: Diagnosis not present

## 2020-04-19 DIAGNOSIS — G8929 Other chronic pain: Secondary | ICD-10-CM | POA: Diagnosis not present

## 2020-04-25 DIAGNOSIS — Z419 Encounter for procedure for purposes other than remedying health state, unspecified: Secondary | ICD-10-CM | POA: Diagnosis not present

## 2020-05-03 ENCOUNTER — Telehealth (HOSPITAL_COMMUNITY): Payer: Self-pay | Admitting: Psychiatry

## 2020-05-03 NOTE — Telephone Encounter (Signed)
Called to schedule f/u appt, left vm 

## 2020-05-10 ENCOUNTER — Other Ambulatory Visit: Payer: Self-pay | Admitting: *Deleted

## 2020-05-10 NOTE — Patient Outreach (Signed)
Care Coordination  05/10/2020  CANYON WILLOW 1960/05/22 [id number]    Medicaid Managed Care   Unsuccessful Outreach Note  05/10/2020 Name: TRANIKA SCHOLLER MRN: [id number] DOB: 1961-02-29  Referred by: Noreene Larsson, NP Reason for referral : High Risk Managed Medicaid (Unsuccessful RNCM telephone outreach)   An unsuccessful telephone outreach was attempted today. The patient was referred to the case management team for assistance with care management and care coordination.   Follow Up Plan: The care management team will reach out to the patient again over the next 7-14 days.   Lurena Joiner RN, BSN   [employer] manager

## 2020-05-10 NOTE — Patient Instructions (Signed)
Visit Information  [name]  - as a part of your Medicaid benefit, you are eligible for care management and care coordination services at no cost or copay. I was unable to reach you by phone today but would be happy to help you with your health related needs. Please feel free to call me @ [phone].   A member of the Managed Medicaid care management team will reach out to you again over the next 7-14 days.   Lurena Joiner RN, BSN [place]  [employer] manager

## 2020-05-11 DIAGNOSIS — E785 Hyperlipidemia, unspecified: Secondary | ICD-10-CM | POA: Diagnosis not present

## 2020-05-11 DIAGNOSIS — E1169 Type 2 diabetes mellitus with other specified complication: Secondary | ICD-10-CM | POA: Diagnosis not present

## 2020-05-11 DIAGNOSIS — E1142 Type 2 diabetes mellitus with diabetic polyneuropathy: Secondary | ICD-10-CM | POA: Diagnosis not present

## 2020-05-12 LAB — CBC WITH DIFFERENTIAL/PLATELET
Basophils Absolute: 0 10*3/uL (ref 0.0–0.2)
Basos: 1 %
EOS (ABSOLUTE): 0.1 10*3/uL (ref 0.0–0.4)
Eos: 3 %
Hematocrit: 36.3 % (ref 34.0–46.6)
Hemoglobin: 11.7 g/dL (ref 11.1–15.9)
Immature Grans (Abs): 0 10*3/uL (ref 0.0–0.1)
Immature Granulocytes: 0 %
Lymphocytes Absolute: 1.3 10*3/uL (ref 0.7–3.1)
Lymphs: 27 %
MCH: 28.2 pg (ref 26.6–33.0)
MCHC: 32.2 g/dL (ref 31.5–35.7)
MCV: 88 fL (ref 79–97)
Monocytes Absolute: 0.4 10*3/uL (ref 0.1–0.9)
Monocytes: 8 %
Neutrophils Absolute: 2.9 10*3/uL (ref 1.4–7.0)
Neutrophils: 61 %
Platelets: 226 10*3/uL (ref 150–450)
RBC: 4.15 x10E6/uL (ref 3.77–5.28)
RDW: 13.4 % (ref 11.7–15.4)
WBC: 4.8 10*3/uL (ref 3.4–10.8)

## 2020-05-12 LAB — CMP14+EGFR
ALT: 15 IU/L (ref 0–32)
AST: 12 IU/L (ref 0–40)
Albumin/Globulin Ratio: 2.2 (ref 1.2–2.2)
Albumin: 4.2 g/dL (ref 3.8–4.9)
Alkaline Phosphatase: 61 IU/L (ref 44–121)
BUN/Creatinine Ratio: 25 (ref 12–28)
BUN: 15 mg/dL (ref 8–27)
Bilirubin Total: <0.2 mg/dL (ref 0.0–1.2)
CO2: 25 mmol/L (ref 20–29)
Calcium: 9.4 mg/dL (ref 8.7–10.3)
Chloride: 102 mmol/L (ref 96–106)
Creatinine, Ser: 0.61 mg/dL (ref 0.57–1.00)
GFR calc Af Amer: 114 mL/min/{1.73_m2} (ref >59)
GFR calc non Af Amer: 99 mL/min/{1.73_m2} (ref >59)
Globulin, Total: 1.9 g/dL (ref 1.5–4.5)
Glucose: 108 mg/dL — ABNORMAL HIGH (ref 65–99)
Potassium: 4.7 mmol/L (ref 3.5–5.2)
Sodium: 140 mmol/L (ref 134–144)
Total Protein: 6.1 g/dL (ref 6.0–8.5)

## 2020-05-12 LAB — LIPID PANEL WITH LDL/HDL RATIO
Cholesterol, Total: 154 mg/dL (ref 100–199)
HDL: 73 mg/dL (ref >39)
LDL Chol Calc (NIH): 69 mg/dL (ref 0–99)
LDL/HDL Ratio: 0.9 ratio (ref 0.0–3.2)
Triglycerides: 59 mg/dL (ref 0–149)
VLDL Cholesterol Cal: 12 mg/dL (ref 5–40)

## 2020-05-12 LAB — HEMOGLOBIN A1C
Est. average glucose Bld gHb Est-mCnc: 146 mg/dL
Hgb A1c MFr Bld: 6.7 % — ABNORMAL HIGH (ref 4.8–5.6)

## 2020-05-12 NOTE — Progress Notes (Signed)
Labs look great. No change in meds today, but we can discuss results at appointment on 2/24.

## 2020-05-18 ENCOUNTER — Other Ambulatory Visit: Payer: Self-pay

## 2020-05-18 ENCOUNTER — Encounter: Payer: Self-pay | Admitting: Nurse Practitioner

## 2020-05-18 ENCOUNTER — Ambulatory Visit: Payer: Medicaid Other | Admitting: Nurse Practitioner

## 2020-05-18 VITALS — BP 107/71 | HR 77 | Temp 97.8°F | Resp 20 | Ht 60.0 in | Wt 138.0 lb

## 2020-05-18 DIAGNOSIS — K649 Unspecified hemorrhoids: Secondary | ICD-10-CM | POA: Diagnosis not present

## 2020-05-18 DIAGNOSIS — E1165 Type 2 diabetes mellitus with hyperglycemia: Secondary | ICD-10-CM | POA: Diagnosis not present

## 2020-05-18 DIAGNOSIS — E1169 Type 2 diabetes mellitus with other specified complication: Secondary | ICD-10-CM

## 2020-05-18 DIAGNOSIS — E785 Hyperlipidemia, unspecified: Secondary | ICD-10-CM | POA: Diagnosis not present

## 2020-05-18 DIAGNOSIS — I10 Essential (primary) hypertension: Secondary | ICD-10-CM

## 2020-05-18 DIAGNOSIS — Z0000 Encounter for general adult medical examination without abnormal findings: Secondary | ICD-10-CM

## 2020-05-18 DIAGNOSIS — K5900 Constipation, unspecified: Secondary | ICD-10-CM | POA: Diagnosis not present

## 2020-05-18 MED ORDER — HYDROCORTISONE (PERIANAL) 2.5 % EX CREA
1.0000 | TOPICAL_CREAM | Freq: Two times a day (BID) | CUTANEOUS | 0 refills | Status: DC
Start: 2020-05-18 — End: 2021-03-05

## 2020-05-18 NOTE — Assessment & Plan Note (Signed)
-  well controlled today -no change to meds

## 2020-05-18 NOTE — Assessment & Plan Note (Signed)
-  Rx. anusol -she has upcoming appt with Dr. Laural Golden

## 2020-05-18 NOTE — Patient Instructions (Signed)
Constipation, Adult Constipation is when a person has trouble pooping (having a bowel movement). When you have this condition, you may poop fewer than 3 times a week. Your poop (stool) may also be dry, hard, or bigger than normal. Follow these instructions at home: Eating and drinking  Eat foods that have a lot of fiber, such as: ? Fresh fruits and vegetables. ? Whole grains. ? Beans.  Eat less of foods that are low in fiber and high in fat and sugar, such as: ? French fries. ? Hamburgers. ? Cookies. ? Candy. ? Soda.  Drink enough fluid to keep your pee (urine) pale yellow.   General instructions  Exercise regularly or as told by your doctor. Try to do 150 minutes of exercise each week.  Go to the restroom when you feel like you need to poop. Do not hold it in.  Take over-the-counter and prescription medicines only as told by your doctor. These include any fiber supplements.  When you poop: ? Do deep breathing while relaxing your lower belly (abdomen). ? Relax your pelvic floor. The pelvic floor is a group of muscles that support the rectum, bladder, and intestines (as well as the uterus in women).  Watch your condition for any changes. Tell your doctor if you notice any.  Keep all follow-up visits as told by your doctor. This is important. Contact a doctor if:  You have pain that gets worse.  You have a fever.  You have not pooped for 4 days.  You vomit.  You are not hungry.  You lose weight.  You are bleeding from the opening of the butt (anus).  You have thin, pencil-like poop. Get help right away if:  You have a fever, and your symptoms suddenly get worse.  You leak poop or have blood in your poop.  Your belly feels hard or bigger than normal (bloated).  You have very bad belly pain.  You feel dizzy or you faint. Summary  Constipation is when a person poops fewer than 3 times a week, has trouble pooping, or has poop that is dry, hard, or bigger than  normal.  Eat foods that have a lot of fiber.  Drink enough fluid to keep your pee (urine) pale yellow.  Take over-the-counter and prescription medicines only as told by your doctor. These include any fiber supplements. This information is not intended to replace advice given to you by your health care provider. Make sure you discuss any questions you have with your health care provider. Document Revised: 01/27/2019 Document Reviewed: 01/27/2019 Elsevier Patient Education  2021 Elsevier Inc.  

## 2020-05-18 NOTE — Assessment & Plan Note (Signed)
Lab Results  Component Value Date   HGBA1C 6.7 (H) 05/11/2020  -no change to meds -on ACEi and statin -A1c at goal

## 2020-05-18 NOTE — Assessment & Plan Note (Signed)
-  f/u with Re[facility] -taking OTC laxatives -included educational handout to prevent constipation

## 2020-05-18 NOTE — Progress Notes (Signed)
Established Patient Office Visit  Subjective:  Patient ID: Charlotte Perez, female    DOB: 1960-12-02  Age: 60 y.o. MRN: [id number]  CC:  Chief Complaint  Patient presents with  . Diabetes  . Anxiety  . Follow-up    HPI Charlotte Perez presents for lab follow-up.  She has hx of DM, HTN, and anxiety. Her BP has been well controlled with her current meds. No adverse medication effects. Se as been taking jardiance, metformin, and actos for her blood sugar, and that is well controlled.  She still has epigastric pain, and she has an appointment with Dr. Laural Golden in May. She states she has constipation and she has been taking milk of magnesia as well as magnesium citrate. The constipation is aggravating her hemorrhoids.  Past Medical History:  Diagnosis Date  . 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 Arthritis   . Asthma   . Bipolar disorder ([place])   . Chronic back pain   . Chronic neck pain   . Constipation 09/05/2016  . COPD (chronic obstructive pulmonary disease) ([place])   . DDD (degenerative disc disease), cervical   . DDD (degenerative disc disease), lumbar   . Depression   . Diabetes mellitus ([place]) 06/27/2010  . Diabetes mellitus, type II ([place])   . Gallbladder sludge   . GERD (gastroesophageal reflux disease)   . Hyperlipidemia   . Hypertension   . Iron deficiency anemia 09/05/2016  . Lumbar radiculopathy   . Mole (skin)   . Neuropathy of foot   . Renal cyst   . Shingles   . Skin lesions, generalized    1.2 CM FLAT FAWN COLOR AT THE T10 AREA JUST RIGHT OF SPINE     Past Surgical History:  Procedure Laterality Date  . ABDOMINAL HYSTERECTOMY     total hysterectomy  . BACK SURGERY    . CESAREAN SECTION     x 2  . NECK SURGERY      Family History  Problem Relation Age of Onset  . ADD / ADHD Other   . COPD Father   . Alcohol abuse Father   . Depression Daughter   . Alcohol abuse Mother   . COPD Mother        on oxygen  . Colon  cancer Paternal Grandfather   . Heart disease Sister   . Heart disease Brother   . Arthritis Sister        knee replacement  . Diabetes Maternal Grandmother   . Diabetes Sister     Social History   Socioeconomic History  . Marital status: Married    Spouse name: [name]  . Number of children: 3  . Years of education: Not on file  . Highest education level: Not on file  Occupational History  . Occupation: disablility  Tobacco Use  . Smoking status: Passive Smoke Exposure - Never Smoker  . Smokeless tobacco: Never Used  Vaping Use  . Vaping Use: Never used  Substance and Sexual Activity  . Alcohol use: No  . Drug use: No  . Sexual activity: Yes    Birth control/protection: Surgical  Other Topics Concern  . Not on file  Social History Narrative  . Not on file   Social Determinants of Health   Financial Resource Strain: Not on file  Food Insecurity: Not on file  Transportation Needs: Not on file  Physical Activity: Not on file  Stress: Not on file  Social Connections: Not on file  Intimate Partner Violence: Not on file    Outpatient Medications Prior to Visit  Medication Sig Dispense Refill  . ACCU-CHEK FASTCLIX LANCETS MISC EVERY DAY 102 each 2  . albuterol (PROVENTIL) (2.5 MG/3ML) 0.083% nebulizer solution Take 3 mLs (2.5 mg total) by nebulization every 6 (six) hours as needed for wheezing or shortness of breath. 75 mL 3  . ALPRAZolam (XANAX) 1 MG tablet Take 1 tablet (1 mg total) by mouth at bedtime as needed. 30 tablet 2  . Ascorbic Acid (VITAMIN C PO) Take by mouth. ImmuBlast    . aspirin 81 MG EC tablet Take 1 tablet (81 mg total) by mouth daily. Swallow whole.    Marland Kitchen atorvastatin (LIPITOR) 40 MG tablet Take 1 tablet (40 mg total) by mouth daily. 90 tablet 3  . cycloSPORINE (RESTASIS) 0.05 % ophthalmic emulsion 1 drop 2 (two) times daily.    . empagliflozin (JARDIANCE) 10 MG TABS tablet TAKE ONE (1) TABLET EACH DAY 90 tablet 0  . glucose blood (ACCU-CHEK GUIDE)  test strip Use daily Dx E11.9 100 each 3  . lisinopril (ZESTRIL) 5 MG tablet TAKE ONE (1) TABLET EACH DAY 90 tablet 0  . metFORMIN (GLUCOPHAGE) 1000 MG tablet TAKE ONE TABLET TWICE A DAY WITH FOOD 180 tablet 0  . naproxen (NAPROSYN) 500 MG tablet TAKE ONE TABLET TWICE A DAY WITH FOOD 60 tablet 0  . omeprazole (PRILOSEC) 40 MG capsule Take 1 capsule (40 mg total) by mouth daily. 90 capsule 1  . ondansetron (ZOFRAN-ODT) 4 MG disintegrating tablet Take 1 tablet (4 mg total) by mouth every 8 (eight) hours as needed for nausea or vomiting. 20 tablet 0  . OXYCODONE ER PO Take by mouth.    . pioglitazone (ACTOS) 15 MG tablet TAKE ONE (1) TABLET EACH DAY 90 tablet 0  . pregabalin (LYRICA) 50 MG capsule Take 1 capsule (50 mg total) by mouth 3 (three) times daily. 90 capsule 3  . venlafaxine XR (EFFEXOR-XR) 150 MG 24 hr capsule Take 2 capsules (300 mg total) by mouth at bedtime. 60 capsule 2   No facility-administered medications prior to visit.    Allergies  Allergen Reactions  . Cymbalta [Duloxetine Hcl]     Per pt it started making her mouth tingle and could not taste anything  . Gabapentin     Itching, fidgety  . Meloxicam Other (See Comments)    Tongue tingling, and lost sense of taste.  . Tramadol Other (See Comments)    Tongue tingling, and lost sense of taste.    ROS Review of Systems  Constitutional: Negative.   Respiratory: Negative.   Cardiovascular: Negative.   Gastrointestinal: Positive for abdominal pain and constipation.       Hemorrhoids  Psychiatric/Behavioral: Negative.       Objective:    Physical Exam Constitutional:      Appearance: Normal appearance.  Cardiovascular:     Rate and Rhythm: Normal rate and regular rhythm.     Pulses: Normal pulses.     Heart sounds: Normal heart sounds.  Pulmonary:     Effort: Pulmonary effort is normal.     Breath sounds: Normal breath sounds.  Neurological:     Mental Status: She is alert.  Psychiatric:        Mood and  Affect: Mood normal.        Behavior: Behavior normal.        Thought Content: Thought content normal.        Judgment: Judgment normal.  BP 107/71   Pulse 77   Temp 97.8 F (36.6 C)   Resp 20   Ht 5' (1.524 m)   Wt 138 lb (62.6 kg)   SpO2 96%   BMI 26.95 kg/m  Wt Readings from Last 3 Encounters:  05/18/20 138 lb (62.6 kg)  04/14/20 140 lb (63.5 kg)  03/21/20 141 lb (64 kg)     Health Maintenance Due  Topic Date Due  . OPHTHALMOLOGY EXAM  08/11/2019  . COVID-19 Vaccine (3 - Booster) 12/29/2019  . FOOT EXAM  01/14/2020    There are no preventive care reminders to display for this patient.  Lab Results  Component Value Date   TSH 1.760 07/26/2016   Lab Results  Component Value Date   WBC 4.8 05/11/2020   HGB 11.7 05/11/2020   HCT 36.3 05/11/2020   MCV 88 05/11/2020   PLT 226 05/11/2020   Lab Results  Component Value Date   NA 140 05/11/2020   K 4.7 05/11/2020   CO2 25 05/11/2020   GLUCOSE 108 (H) 05/11/2020   BUN 15 05/11/2020   CREATININE 0.61 05/11/2020   BILITOT <0.2 05/11/2020   ALKPHOS 61 05/11/2020   AST 12 05/11/2020   ALT 15 05/11/2020   PROT 6.1 05/11/2020   ALBUMIN 4.2 05/11/2020   CALCIUM 9.4 05/11/2020   ANIONGAP 10 03/21/2020   Lab Results  Component Value Date   CHOL 154 05/11/2020   Lab Results  Component Value Date   HDL 73 05/11/2020   Lab Results  Component Value Date   LDLCALC 69 05/11/2020   Lab Results  Component Value Date   TRIG 59 05/11/2020   Lab Results  Component Value Date   CHOLHDL 2.1 06/09/2018   Lab Results  Component Value Date   HGBA1C 6.7 (H) 05/11/2020      Assessment & Plan:   Problem List Items Addressed This Visit      Cardiovascular and Mediastinum   High blood pressure    -well controlled today -no change to meds      Relevant Orders   CBC with Differential/Platelet   CMP14+EGFR   Hemorrhoid    -Rx. anusol -she has upcoming appt with Dr. Laural Golden      Relevant Medications    hydrocortisone (ANUSOL-HC) 2.5 % rectal cream     Endocrine   Hyperlipidemia associated with type 2 diabetes mellitus ([place])    Lab Results  Component Value Date   HGBA1C 6.7 (H) 05/11/2020  -no change to meds -on ACEi and statin -A1c at goal       Relevant Orders   CBC with Differential/Platelet   CMP14+EGFR   Hemoglobin A1c   Lipid Panel With LDL/HDL Ratio   Microalbumin / creatinine urine ratio     Other   Constipation    -f/u with [facility] -taking OTC laxatives -included educational handout to prevent constipation       Other Visit Diagnoses    Preventative health care    -  Primary   Relevant Orders   Ambulatory referral to Obstetrics / Gynecology   Type 2 diabetes mellitus with hyperglycemia, without long-term current use of insulin ([place])       Relevant Orders   Hemoglobin A1c   Microalbumin / creatinine urine ratio      Meds ordered this encounter  Medications  . hydrocortisone (ANUSOL-HC) 2.5 % rectal cream    Sig: Place 1 application rectally 2 (two) times daily.    Dispense:  30  g    Refill:  0    Follow-up: Return in about 3 months (around 08/15/2020) for Lab follow-up (diabetes).    Noreene Larsson, NP

## 2020-05-19 DIAGNOSIS — G8929 Other chronic pain: Secondary | ICD-10-CM | POA: Diagnosis not present

## 2020-05-19 DIAGNOSIS — Z79899 Other long term (current) drug therapy: Secondary | ICD-10-CM | POA: Diagnosis not present

## 2020-05-19 DIAGNOSIS — M545 Low back pain, unspecified: Secondary | ICD-10-CM | POA: Diagnosis not present

## 2020-05-23 DIAGNOSIS — Z419 Encounter for procedure for purposes other than remedying health state, unspecified: Secondary | ICD-10-CM | POA: Diagnosis not present

## 2020-05-30 ENCOUNTER — Telehealth: Payer: Self-pay | Admitting: Nurse Practitioner

## 2020-05-30 NOTE — Telephone Encounter (Signed)
I attempted to reach Charlotte Perez today to get her rescheduled for her phone visit with the [facility] on the Managed Medicaid team. No one answered and the voicemail was not set up. I will reach out again in the next 7-14 days,

## 2020-05-31 ENCOUNTER — Other Ambulatory Visit: Payer: Self-pay | Admitting: Nurse Practitioner

## 2020-05-31 DIAGNOSIS — E1142 Type 2 diabetes mellitus with diabetic polyneuropathy: Secondary | ICD-10-CM

## 2020-05-31 DIAGNOSIS — E1165 Type 2 diabetes mellitus with hyperglycemia: Secondary | ICD-10-CM

## 2020-06-12 ENCOUNTER — Encounter (HOSPITAL_COMMUNITY): Payer: Self-pay | Admitting: Psychiatry

## 2020-06-12 ENCOUNTER — Other Ambulatory Visit: Payer: Self-pay

## 2020-06-12 ENCOUNTER — Telehealth (INDEPENDENT_AMBULATORY_CARE_PROVIDER_SITE_OTHER): Payer: Medicaid Other | Admitting: Psychiatry

## 2020-06-12 DIAGNOSIS — F321 Major depressive disorder, single episode, moderate: Secondary | ICD-10-CM | POA: Diagnosis not present

## 2020-06-12 MED ORDER — ALPRAZOLAM 1 MG PO TABS: 1.0000 mg | ORAL_TABLET | Freq: Every evening | ORAL | 2 refills | Status: DC | PRN

## 2020-06-12 MED ORDER — VENLAFAXINE HCL ER 150 MG PO CP24: 300.0000 mg | ORAL_CAPSULE | Freq: Every day | ORAL | 2 refills | Status: DC

## 2020-06-12 NOTE — Progress Notes (Signed)
Virtual Visit via Telephone Note  I connected with Charlotte Perez on 06/12/20 at 10:40 AM EDT by telephone and verified that I am speaking with the correct person using two identifiers.  Location: Patient: home Provider: home   I discussed the limitations, risks, security and privacy concerns of performing an evaluation and management service by telephone and the availability of in person appointments. I also discussed with the patient that there may be a patient responsible charge related to this service. The patient expressed understanding and agreed to proceed.    I discussed the assessment and treatment plan with the patient. The patient was provided an opportunity to ask questions and all were answered. The patient agreed with the plan and demonstrated an understanding of the instructions.   The patient was advised to call back or seek an in-person evaluation if the symptoms worsen or if the condition fails to improve as anticipated.  I provided 15 minutes of non-face-to-face time during this encounter.   [name], MD  [facility] MD/PA/NP OP Progress Note  06/12/2020 10:53 AM Charlotte Perez  MRN:  [id number]  Chief Complaint:  Chief Complaint    Anxiety; Depression; Follow-up     HPI: this patient is a 60 year old married white female who lives with her husband in [place]. She has 3 grown children from her first marriage and 8 grandchildren. She is on disability  The patient returns for follow-up after 3 months.  She states overall she is doing okay.  She is trying to take care of her husband who has early dementia and is doing a lot of the housework yard work Social research officer, government.  She has good and bad days.  However she denies severe depression or suicidal ideation.  She is sleeping fairly well.  The Xanax continues to help with her anxiety.  At times she still has mood swings but overall she feels that this is controlled by the Effexor XR.  She denies suicidal ideation Visit Diagnosis:     ICD-10-CM   1. Moderate single current episode of major depressive disorder ([place])  F32.1     Past Psychiatric History: Long-term outpatient treatment  Past Medical History:  Past Medical History:  Diagnosis Date  . 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 Arthritis   . Asthma   . Bipolar disorder ([place])   . Chronic back pain   . Chronic neck pain   . Constipation 09/05/2016  . COPD (chronic obstructive pulmonary disease) ([place])   . DDD (degenerative disc disease), cervical   . DDD (degenerative disc disease), lumbar   . Depression   . Diabetes mellitus ([place]) 06/27/2010  . Diabetes mellitus, type II ([place])   . Gallbladder sludge   . GERD (gastroesophageal reflux disease)   . Hyperlipidemia   . Hypertension   . Iron deficiency anemia 09/05/2016  . Lumbar radiculopathy   . Mole (skin)   . Neuropathy of foot   . Renal cyst   . Shingles   . Skin lesions, generalized    1.2 CM FLAT FAWN COLOR AT THE T10 AREA JUST RIGHT OF SPINE     Past Surgical History:  Procedure Laterality Date  . ABDOMINAL HYSTERECTOMY     total hysterectomy  . BACK SURGERY    . CESAREAN SECTION     x 2  . NECK SURGERY      Family Psychiatric History: see below  Family History:  Family History  Problem Relation Age of Onset  . ADD /  ADHD Other   . COPD Father   . Alcohol abuse Father   . Depression Daughter   . Alcohol abuse Mother   . COPD Mother        on oxygen  . Colon cancer Paternal Grandfather   . Heart disease Sister   . Heart disease Brother   . Arthritis Sister        knee replacement  . Diabetes Maternal Grandmother   . Diabetes Sister     Social History:  Social History   Socioeconomic History  . Marital status: Married    Spouse name: Charlotte Perez  . Number of children: 3  . Years of education: Not on file  . Highest education level: Not on file  Occupational History  . Occupation: disablility  Tobacco Use  . Smoking status: Passive Smoke Exposure  - Never Smoker  . Smokeless tobacco: Never Used  Vaping Use  . Vaping Use: Never used  Substance and Sexual Activity  . Alcohol use: No  . Drug use: No  . Sexual activity: Yes    Birth control/protection: Surgical  Other Topics Concern  . Not on file  Social History Narrative  . Not on file   Social Determinants of Health   Financial Resource Strain: Not on file  Food Insecurity: Not on file  Transportation Needs: Not on file  Physical Activity: Not on file  Stress: Not on file  Social Connections: Not on file    Allergies:  Allergies  Allergen Reactions  . Cymbalta [Duloxetine Hcl]     Per pt it started making her mouth tingle and could not taste anything  . Gabapentin     Itching, fidgety  . Meloxicam Other (See Comments)    Tongue tingling, and lost sense of taste.  . Tramadol Other (See Comments)    Tongue tingling, and lost sense of taste.    Metabolic Disorder Labs: Lab Results  Component Value Date   HGBA1C 6.7 (H) 05/11/2020   No results found for: PROLACTIN Lab Results  Component Value Date   CHOL 154 05/11/2020   TRIG 59 05/11/2020   HDL 73 05/11/2020   CHOLHDL 2.1 06/09/2018   LDLCALC 69 05/11/2020   LDLCALC 135 (H) 02/09/2020   Lab Results  Component Value Date   TSH 1.760 07/26/2016   TSH 1.300 07/20/2014    Therapeutic Level Labs: No results found for: LITHIUM No results found for: VALPROATE No components found for:  CBMZ  Current Medications: Current Outpatient Medications  Medication Sig Dispense Refill  . ACCU-CHEK FASTCLIX LANCETS MISC EVERY DAY 102 each 2  . albuterol (PROVENTIL) (2.5 MG/3ML) 0.083% nebulizer solution Take 3 mLs (2.5 mg total) by nebulization every 6 (six) hours as needed for wheezing or shortness of breath. 75 mL 3  . ALPRAZolam (XANAX) 1 MG tablet Take 1 tablet (1 mg total) by mouth at bedtime as needed. 30 tablet 2  . Ascorbic Acid (VITAMIN C PO) Take by mouth. ImmuBlast    . aspirin 81 MG EC tablet Take 1  tablet (81 mg total) by mouth daily. Swallow whole.    Marland Kitchen atorvastatin (LIPITOR) 40 MG tablet Take 1 tablet (40 mg total) by mouth daily. 90 tablet 3  . cycloSPORINE (RESTASIS) 0.05 % ophthalmic emulsion 1 drop 2 (two) times daily.    . empagliflozin (JARDIANCE) 10 MG TABS tablet TAKE ONE (1) TABLET EACH DAY 90 tablet 0  . glucose blood (ACCU-CHEK GUIDE) test strip Use daily Dx E11.9 100 each 3  .  hydrocortisone (ANUSOL-HC) 2.5 % rectal cream Place 1 application rectally 2 (two) times daily. 30 g 0  . lisinopril (ZESTRIL) 5 MG tablet TAKE ONE (1) TABLET EACH DAY 90 tablet 0  . metFORMIN (GLUCOPHAGE) 1000 MG tablet TAKE ONE TABLET TWICE A DAY WITH FOOD 180 tablet 0  . naproxen (NAPROSYN) 500 MG tablet TAKE ONE TABLET TWICE A DAY WITH FOOD 60 tablet 0  . omeprazole (PRILOSEC) 40 MG capsule Take 1 capsule (40 mg total) by mouth daily. 90 capsule 1  . ondansetron (ZOFRAN-ODT) 4 MG disintegrating tablet Take 1 tablet (4 mg total) by mouth every 8 (eight) hours as needed for nausea or vomiting. 20 tablet 0  . OXYCODONE ER PO Take by mouth.    . pioglitazone (ACTOS) 15 MG tablet TAKE ONE (1) TABLET EACH DAY 90 tablet 0  . pregabalin (LYRICA) 50 MG capsule TAKE ONE (1) CAPSULE THREE (3) TIMES EACH DAY 90 capsule 1  . venlafaxine XR (EFFEXOR-XR) 150 MG 24 hr capsule Take 2 capsules (300 mg total) by mouth at bedtime. 60 capsule 2   No current facility-administered medications for this visit.     Musculoskeletal: Strength & Muscle Tone: within normal limits Gait & Station: normal Patient leans: N/A  Psychiatric Specialty Exam: Review of Systems  Musculoskeletal: Positive for back pain.  Psychiatric/Behavioral: The patient is nervous/anxious.   All other systems reviewed and are negative.   There were no vitals taken for this visit.There is no height or weight on file to calculate BMI.  General Appearance: NA  Eye Contact:  NA  Speech:  Clear and Coherent  Volume:  Normal  Mood:  Euthymic   Affect:  Appropriate and Congruent  Thought Process:  Goal Directed  Orientation:  Full (Time, Place, and Person)  Thought Content: Rumination   Suicidal Thoughts:  No  Homicidal Thoughts:  No  Memory:  Immediate;   Good Recent;   Good Remote;   Fair  Judgement:  Good  Insight:  Good  Psychomotor Activity:  Decreased  Concentration:  Concentration: Fair and Attention Span: Fair  Recall:  Good  Fund of Knowledge: Good  Language: Good  Akathisia:  No  Handed:  Right  AIMS (if indicated): not done  Assets:  Communication Skills Desire for Improvement Resilience Social Support Talents/Skills  ADL's:  Intact  Cognition: WNL  Sleep:  Good   Screenings: PHQ2-9   Flowsheet Row Video Visit from 06/12/2020 in [facility] Office Visit from 05/18/2020 in [facility] Office Visit from 04/14/2020 in [facility] Office Visit from 02/16/2020 in [facility] Office Visit from 02/09/2020 in [facility]  PHQ-2 Total Score 2 2 4 2 2   PHQ-9 Total Score 7 7 11 13 6     Flowsheet Row Video Visit from 06/12/2020 in [facility] No Risk       Assessment and Plan: This patient is a 60 year old female with a history of depression and anxiety.  For the most part she has been stable.  She will continue Effexor XR 300 mg at bedtime for depression and Xanax 1 mg at bedtime for anxiety and sleep.  She will return to see me in 3 months   [name], MD 06/12/2020, 10:53 AM

## 2020-06-16 ENCOUNTER — Telehealth (HOSPITAL_COMMUNITY): Payer: Medicaid Other | Admitting: Psychiatry

## 2020-06-16 DIAGNOSIS — Z79899 Other long term (current) drug therapy: Secondary | ICD-10-CM | POA: Diagnosis not present

## 2020-06-16 DIAGNOSIS — G8929 Other chronic pain: Secondary | ICD-10-CM | POA: Diagnosis not present

## 2020-06-16 DIAGNOSIS — M545 Low back pain, unspecified: Secondary | ICD-10-CM | POA: Diagnosis not present

## 2020-06-23 DIAGNOSIS — Z419 Encounter for procedure for purposes other than remedying health state, unspecified: Secondary | ICD-10-CM | POA: Diagnosis not present

## 2020-06-30 DIAGNOSIS — R5383 Other fatigue: Secondary | ICD-10-CM | POA: Diagnosis not present

## 2020-06-30 DIAGNOSIS — E1142 Type 2 diabetes mellitus with diabetic polyneuropathy: Secondary | ICD-10-CM | POA: Diagnosis not present

## 2020-06-30 DIAGNOSIS — K5900 Constipation, unspecified: Secondary | ICD-10-CM | POA: Diagnosis not present

## 2020-06-30 DIAGNOSIS — R42 Dizziness and giddiness: Secondary | ICD-10-CM | POA: Diagnosis not present

## 2020-06-30 DIAGNOSIS — I10 Essential (primary) hypertension: Secondary | ICD-10-CM | POA: Diagnosis not present

## 2020-06-30 DIAGNOSIS — E119 Type 2 diabetes mellitus without complications: Secondary | ICD-10-CM | POA: Diagnosis not present

## 2020-07-08 NOTE — Progress Notes (Signed)
   WELL-WOMAN EXAMINATION Patient name: Charlotte Perez MRN [id number]  Date of birth: 01-Dec-1960 Chief Complaint:   Gynecologic Exam  History of Present Illness:   [name] is a 60 y.o. TAH (not sure about BSO) female being seen today for a routine well-woman exam.  Today she denies hot flashes/night sweats.  Not sexually active.  No acute gyn concerns.   No LMP recorded. Patient has had a hysterectomy.  Last pap 2016.  Last mammogram: 2020. Last colonoscopy: 2017  Depression screen [facility] 2/9 07/12/2020 05/18/2020 04/14/2020 02/16/2020 02/09/2020  Decreased Interest 3 1 2 1 1   Down, Depressed, Hopeless 3 1 2 1 1   PHQ - 2 Score 6 2 4 2 2   Altered sleeping 1 1 1 2 1   Tired, decreased energy 3 3 2 3 1   Change in appetite 2 0 2 0 0  Feeling bad or failure about yourself  2 1 2 3 1   Trouble concentrating 3 0 0 3 1  Moving slowly or fidgety/restless 2 0 0 0 0  Suicidal thoughts 0 0 0 0 0  PHQ-9 Score 19 7 11 13 6   Difficult doing work/chores - Somewhat difficult Somewhat difficult - -  Some encounter information is confidential and restricted. Go to Review Flowsheets activity to see all data.  Some recent data might be hidden      Review of Systems:   Pertinent items are noted in HPI Denies any headaches, blurred vision, fatigue, shortness of breath, chest pain, abdominal pain, bowel movements, urination, or intercourse unless otherwise stated above.  Pertinent History Reviewed:  Reviewed past medical,surgical, social and family history.  Reviewed problem list, medications and allergies. Physical Assessment:   Vitals:   07/12/20 0942  BP: 120/72  Pulse: 92  Weight: 138 lb (62.6 kg)  Height: 4' 10.5" (1.486 m)  Body mass index is 28.35 kg/m.        Physical Examination:   General appearance - well appearing, and in no distress  Mental status - alert, oriented to person, place, and time  Psych:  She has a normal mood and affect  Skin - warm and dry, normal color,  no suspicious lesions noted  Chest - effort normal, all lung fields clear to auscultation bilaterally  Heart - normal rate and regular rhythm  Neck:  midline trachea, no thyromegaly or nodules  Breasts - breasts appear normal, no suspicious masses, no skin or nipple changes or  axillary nodes  Abdomen - soft, nontender, nondistended, no masses or organomegaly  Pelvic - VULVA: normal appearing vulva with no masses, tenderness or lesions  VAGINA: pink flat atrophic changes noted, uterus and cervix surgically absent ADNEXA: No adnexal masses or tenderness noted.  Extremities:  No swelling or varicosities noted  Chaperone: Levy Pupa     Assessment & Plan:  1) Well-Woman Exam -Pap not indicated due to prior hysterectomy -mammogram to be scheduled   Orders Placed This Encounter  Procedures  . MM 3D SCREEN BREAST BILATERAL    Meds: No orders of the defined types were placed in this encounter.   Follow-up: Return for 2 yr annual.   Janyth Pupa, DO Attending [place], [facility] for [employer], [place]

## 2020-07-12 ENCOUNTER — Ambulatory Visit (INDEPENDENT_AMBULATORY_CARE_PROVIDER_SITE_OTHER): Payer: Medicaid Other | Admitting: Obstetrics & Gynecology

## 2020-07-12 ENCOUNTER — Other Ambulatory Visit: Payer: Self-pay

## 2020-07-12 ENCOUNTER — Encounter: Payer: Self-pay | Admitting: Obstetrics & Gynecology

## 2020-07-12 VITALS — BP 120/72 | HR 92 | Ht 58.5 in | Wt 138.0 lb

## 2020-07-12 DIAGNOSIS — Z1231 Encounter for screening mammogram for malignant neoplasm of breast: Secondary | ICD-10-CM

## 2020-07-12 DIAGNOSIS — Z01419 Encounter for gynecological examination (general) (routine) without abnormal findings: Secondary | ICD-10-CM | POA: Diagnosis not present

## 2020-07-17 DIAGNOSIS — G8929 Other chronic pain: Secondary | ICD-10-CM | POA: Diagnosis not present

## 2020-07-17 DIAGNOSIS — Z20822 Contact with and (suspected) exposure to covid-19: Secondary | ICD-10-CM | POA: Diagnosis not present

## 2020-07-17 DIAGNOSIS — M129 Arthropathy, unspecified: Secondary | ICD-10-CM | POA: Diagnosis not present

## 2020-07-17 DIAGNOSIS — M545 Low back pain, unspecified: Secondary | ICD-10-CM | POA: Diagnosis not present

## 2020-07-17 DIAGNOSIS — R5383 Other fatigue: Secondary | ICD-10-CM | POA: Diagnosis not present

## 2020-07-17 DIAGNOSIS — Z79899 Other long term (current) drug therapy: Secondary | ICD-10-CM | POA: Diagnosis not present

## 2020-07-23 DIAGNOSIS — Z419 Encounter for procedure for purposes other than remedying health state, unspecified: Secondary | ICD-10-CM | POA: Diagnosis not present

## 2020-07-24 ENCOUNTER — Ambulatory Visit (INDEPENDENT_AMBULATORY_CARE_PROVIDER_SITE_OTHER): Payer: Medicaid Other | Admitting: Gastroenterology

## 2020-07-28 DIAGNOSIS — I10 Essential (primary) hypertension: Secondary | ICD-10-CM | POA: Diagnosis not present

## 2020-07-28 DIAGNOSIS — Z1231 Encounter for screening mammogram for malignant neoplasm of breast: Secondary | ICD-10-CM | POA: Diagnosis not present

## 2020-07-28 DIAGNOSIS — E119 Type 2 diabetes mellitus without complications: Secondary | ICD-10-CM | POA: Diagnosis not present

## 2020-07-28 DIAGNOSIS — E538 Deficiency of other specified B group vitamins: Secondary | ICD-10-CM | POA: Diagnosis not present

## 2020-07-28 DIAGNOSIS — R42 Dizziness and giddiness: Secondary | ICD-10-CM | POA: Diagnosis not present

## 2020-08-03 DIAGNOSIS — Z1231 Encounter for screening mammogram for malignant neoplasm of breast: Secondary | ICD-10-CM | POA: Diagnosis not present

## 2020-08-10 DIAGNOSIS — Z885 Allergy status to narcotic agent status: Secondary | ICD-10-CM | POA: Diagnosis not present

## 2020-08-10 DIAGNOSIS — R109 Unspecified abdominal pain: Secondary | ICD-10-CM | POA: Diagnosis not present

## 2020-08-10 DIAGNOSIS — Z7982 Long term (current) use of aspirin: Secondary | ICD-10-CM | POA: Diagnosis not present

## 2020-08-10 DIAGNOSIS — K5900 Constipation, unspecified: Secondary | ICD-10-CM | POA: Diagnosis not present

## 2020-08-10 DIAGNOSIS — E119 Type 2 diabetes mellitus without complications: Secondary | ICD-10-CM | POA: Diagnosis not present

## 2020-08-16 ENCOUNTER — Ambulatory Visit: Payer: Medicaid Other | Admitting: Nurse Practitioner

## 2020-08-17 DIAGNOSIS — E559 Vitamin D deficiency, unspecified: Secondary | ICD-10-CM | POA: Diagnosis not present

## 2020-08-17 DIAGNOSIS — M545 Low back pain, unspecified: Secondary | ICD-10-CM | POA: Diagnosis not present

## 2020-08-17 DIAGNOSIS — G8929 Other chronic pain: Secondary | ICD-10-CM | POA: Diagnosis not present

## 2020-08-17 DIAGNOSIS — E119 Type 2 diabetes mellitus without complications: Secondary | ICD-10-CM | POA: Diagnosis not present

## 2020-08-17 DIAGNOSIS — Z79899 Other long term (current) drug therapy: Secondary | ICD-10-CM | POA: Diagnosis not present

## 2020-08-23 DIAGNOSIS — Z419 Encounter for procedure for purposes other than remedying health state, unspecified: Secondary | ICD-10-CM | POA: Diagnosis not present

## 2020-08-31 ENCOUNTER — Other Ambulatory Visit: Payer: Self-pay | Admitting: Nurse Practitioner

## 2020-08-31 DIAGNOSIS — E1165 Type 2 diabetes mellitus with hyperglycemia: Secondary | ICD-10-CM

## 2020-09-12 ENCOUNTER — Telehealth (HOSPITAL_COMMUNITY): Payer: Medicaid Other | Admitting: Psychiatry

## 2020-09-18 DIAGNOSIS — Z79899 Other long term (current) drug therapy: Secondary | ICD-10-CM | POA: Diagnosis not present

## 2020-09-18 DIAGNOSIS — E119 Type 2 diabetes mellitus without complications: Secondary | ICD-10-CM | POA: Diagnosis not present

## 2020-09-18 DIAGNOSIS — G8929 Other chronic pain: Secondary | ICD-10-CM | POA: Diagnosis not present

## 2020-09-18 DIAGNOSIS — M545 Low back pain, unspecified: Secondary | ICD-10-CM | POA: Diagnosis not present

## 2020-09-22 DIAGNOSIS — Z419 Encounter for procedure for purposes other than remedying health state, unspecified: Secondary | ICD-10-CM | POA: Diagnosis not present

## 2020-09-27 ENCOUNTER — Other Ambulatory Visit (HOSPITAL_COMMUNITY): Payer: Self-pay | Admitting: Psychiatry

## 2020-10-17 ENCOUNTER — Telehealth (HOSPITAL_COMMUNITY): Payer: Medicaid Other | Admitting: Psychiatry

## 2020-10-18 DIAGNOSIS — Z79899 Other long term (current) drug therapy: Secondary | ICD-10-CM | POA: Diagnosis not present

## 2020-10-18 DIAGNOSIS — G8929 Other chronic pain: Secondary | ICD-10-CM | POA: Diagnosis not present

## 2020-10-18 DIAGNOSIS — E119 Type 2 diabetes mellitus without complications: Secondary | ICD-10-CM | POA: Diagnosis not present

## 2020-10-18 DIAGNOSIS — M545 Low back pain, unspecified: Secondary | ICD-10-CM | POA: Diagnosis not present

## 2020-10-23 DIAGNOSIS — Z419 Encounter for procedure for purposes other than remedying health state, unspecified: Secondary | ICD-10-CM | POA: Diagnosis not present

## 2020-10-24 ENCOUNTER — Telehealth (HOSPITAL_COMMUNITY): Payer: Medicaid Other | Admitting: Psychiatry

## 2020-10-24 DIAGNOSIS — H2513 Age-related nuclear cataract, bilateral: Secondary | ICD-10-CM | POA: Diagnosis not present

## 2020-10-24 DIAGNOSIS — H40013 Open angle with borderline findings, low risk, bilateral: Secondary | ICD-10-CM | POA: Diagnosis not present

## 2020-11-14 ENCOUNTER — Other Ambulatory Visit: Payer: Self-pay

## 2020-11-14 ENCOUNTER — Telehealth (INDEPENDENT_AMBULATORY_CARE_PROVIDER_SITE_OTHER): Payer: Medicaid Other | Admitting: Psychiatry

## 2020-11-14 ENCOUNTER — Encounter (HOSPITAL_COMMUNITY): Payer: Self-pay | Admitting: Psychiatry

## 2020-11-14 DIAGNOSIS — F321 Major depressive disorder, single episode, moderate: Secondary | ICD-10-CM

## 2020-11-14 MED ORDER — ALPRAZOLAM 1 MG PO TABS: 1.0000 mg | ORAL_TABLET | Freq: Every evening | ORAL | 2 refills | Status: DC | PRN

## 2020-11-14 MED ORDER — VENLAFAXINE HCL ER 150 MG PO CP24: 300.0000 mg | ORAL_CAPSULE | Freq: Every day | ORAL | 2 refills | Status: DC

## 2020-11-14 MED ORDER — ARIPIPRAZOLE 2 MG PO TABS: 2.0000 mg | ORAL_TABLET | Freq: Every day | ORAL | 2 refills | Status: DC

## 2020-11-14 NOTE — Progress Notes (Signed)
Virtual Visit via Telephone Note  I connected with Charlotte Perez on 11/14/20 at 11:00 AM EDT by telephone and verified that I am speaking with the correct person using two identifiers.  Location: Patient: home Provider: home office   I discussed the limitations, risks, security and privacy concerns of performing an evaluation and management service by telephone and the availability of in person appointments. I also discussed with the patient that there may be a patient responsible charge related to this service. The patient expressed understanding and agreed to proceed.      I discussed the assessment and treatment plan with the patient. The patient was provided an opportunity to ask questions and all were answered. The patient agreed with the plan and demonstrated an understanding of the instructions.   The patient was advised to call back or seek an in-person evaluation if the symptoms worsen or if the condition fails to improve as anticipated.  I provided 15 minutes of non-face-to-face time during this encounter.   [name], MD  [facility] MD/PA/NP OP Progress Note  11/14/2020 11:53 AM Charlotte Perez  MRN:  [id number]  Chief Complaint:  Chief Complaint   Depression; Anxiety; Follow-up    HPI: This patient is a 60 year old married white female who lives with her husband in [place].  They have 3 grown children.  The husband's brother has recently been living with them as well.  She is on disability.  The patient states that she has been feeling more depressed recently.  She is having to help her husband a good deal as he has early dementia.  The husband's brother is now living with them and he is not working and she claims that he "abuses pills."  This has been putting a lot of pressure on both her and her husband financially.  She seems more down than usual although she denies suicidal ideation.  I suggested that we add Abilify to her regimen to help augment her antidepressant  and she agrees.  For the most part she is doing the best she can.  She denies any thoughts of self-harm or suicide visit Diagnosis:    ICD-10-CM   1. 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   Arthritis    Asthma    Bipolar disorder ([place])    Chronic back pain    Chronic neck pain    Constipation 09/05/2016   COPD (chronic obstructive pulmonary disease) ([place])    DDD (degenerative disc disease), cervical    DDD (degenerative disc disease), lumbar    Depression    Diabetes mellitus ([place]) 06/27/2010   Diabetes mellitus, type II (HCC)    Gallbladder sludge    GERD (gastroesophageal reflux disease)    Hyperlipidemia    Hypertension    Iron deficiency anemia 09/05/2016   Lumbar radiculopathy    Mole (skin)    Neuropathy of foot    Renal cyst    Shingles    Skin lesions, generalized    1.2 CM FLAT FAWN COLOR AT THE T10 AREA JUST RIGHT OF SPINE     Past Surgical History:  Procedure Laterality Date   ABDOMINAL HYSTERECTOMY     total hysterectomy   BACK SURGERY     CESAREAN SECTION     x 2  NECK SURGERY      Family Psychiatric History: see below  Family History:  Family History  Problem Relation Age of Onset   ADD / ADHD Other    COPD Father    Alcohol abuse Father    Depression Daughter    Alcohol abuse Mother    COPD Mother        on oxygen   Colon cancer Paternal Grandfather    Heart disease Sister    Heart disease Brother    Arthritis Sister        knee replacement   Diabetes Maternal Grandmother    Diabetes Sister     Social History:  Social History   Socioeconomic History   Marital status: Married    Spouse name: Ronnie   Number of children: 3   Years of education: Not on file   Highest education level: Not on file   Occupational History   Occupation: disablility  Tobacco Use   Smoking status: Passive Smoke Exposure - Never Smoker   Smokeless tobacco: Never  Vaping Use   Vaping Use: Never used  Substance and Sexual Activity   Alcohol use: No   Drug use: No   Sexual activity: Not Currently    Birth control/protection: Surgical    Comment: hyst  Other Topics Concern   Not on file  Social History Narrative   Not on file   Social Determinants of Health   Financial Resource Strain: Medium Risk   Difficulty of Paying Living Expenses: Somewhat hard  Food Insecurity: Food Insecurity Present   Worried About Running Out of Food in the Last Year: Sometimes true   Ran Out of Food in the Last Year: Sometimes true  Transportation Needs: No Transportation Needs   Lack of Transportation (Medical): No   Lack of Transportation (Non-Medical): No  Physical Activity: Inactive   Days of Exercise per Week: 0 days   Minutes of Exercise per Session: 0 min  Stress: No Stress Concern Present   Feeling of Stress : Only a little  Social Connections: Moderately Isolated   Frequency of Communication with Friends and Family: Three times a week   Frequency of Social Gatherings with Friends and Family: Never   Attends Religious Services: Never   Marine scientist or Organizations: No   Attends Archivist Meetings: Never   Marital Status: Married    Allergies:  Allergies  Allergen Reactions   Cymbalta [Duloxetine Hcl]     Per pt it started making her mouth tingle and could not taste anything   Gabapentin     Itching, fidgety   Meloxicam Other (See Comments)    Tongue tingling, and lost sense of taste.   Tramadol Other (See Comments)    Tongue tingling, and lost sense of taste.    Metabolic Disorder Labs: Lab Results  Component Value Date   HGBA1C 6.7 (H) 05/11/2020   No results found for: PROLACTIN Lab Results  Component Value Date   CHOL 154 05/11/2020   TRIG 59 05/11/2020   HDL 73  05/11/2020   CHOLHDL 2.1 06/09/2018   LDLCALC 69 05/11/2020   LDLCALC 135 (H) 02/09/2020   Lab Results  Component Value Date   TSH 1.760 07/26/2016   TSH 1.300 07/20/2014    Therapeutic Level Labs: No results found for: LITHIUM No results found for: VALPROATE No components found for:  CBMZ  Current Medications: Current Outpatient Medications  Medication Sig Dispense Refill   ARIPiprazole (ABILIFY) 2 MG tablet  Take 1 tablet (2 mg total) by mouth daily. 30 tablet 2   ACCU-CHEK FASTCLIX LANCETS MISC EVERY DAY 102 each 2   albuterol (PROVENTIL) (2.5 MG/3ML) 0.083% nebulizer solution Take 3 mLs (2.5 mg total) by nebulization every 6 (six) hours as needed for wheezing or shortness of breath. 75 mL 3   ALPRAZolam (XANAX) 1 MG tablet Take 1 tablet (1 mg total) by mouth at bedtime as needed. 30 tablet 2   atorvastatin (LIPITOR) 40 MG tablet Take 1 tablet (40 mg total) by mouth daily. 90 tablet 3   cycloSPORINE (RESTASIS) 0.05 % ophthalmic emulsion 1 drop 2 (two) times daily.     empagliflozin (JARDIANCE) 10 MG TABS tablet TAKE ONE (1) TABLET EACH DAY 90 tablet 0   glucose blood (ACCU-CHEK GUIDE) test strip Use daily Dx E11.9 100 each 3   hydrocortisone (ANUSOL-HC) 2.5 % rectal cream Place 1 application rectally 2 (two) times daily. 30 g 0   latanoprost (XALATAN) 0.005 % ophthalmic solution Place 1 drop into both eyes at bedtime.     lisinopril (ZESTRIL) 5 MG tablet TAKE ONE (1) TABLET EACH DAY 90 tablet 0   metFORMIN (GLUCOPHAGE) 1000 MG tablet TAKE ONE TABLET TWICE A DAY WITH FOOD 180 tablet 0   omeprazole (PRILOSEC) 40 MG capsule TAKE ONE (1) CAPSULE EACH DAY 90 capsule 1   ondansetron (ZOFRAN-ODT) 4 MG disintegrating tablet Take 1 tablet (4 mg total) by mouth every 8 (eight) hours as needed for nausea or vomiting. 20 tablet 0   OXYCODONE ER PO Take by mouth.     pregabalin (LYRICA) 50 MG capsule TAKE ONE (1) CAPSULE THREE (3) TIMES EACH DAY 90 capsule 1   venlafaxine XR (EFFEXOR-XR) 150  MG 24 hr capsule Take 2 capsules (300 mg total) by mouth at bedtime. 60 capsule 2   No current facility-administered medications for this visit.     Musculoskeletal: Strength & Muscle Tone: within normal limits Gait & Station: normal Patient leans: N/A  Psychiatric Specialty Exam: Review of Systems  Psychiatric/Behavioral:  Positive for dysphoric mood.   All other systems reviewed and are negative.  There were no vitals taken for this visit.There is no height or weight on file to calculate BMI.  General Appearance: NA  Eye Contact:  NA  Speech:  Clear and Coherent  Volume:  Normal  Mood:  Dysphoric  Affect:  NA  Thought Process:  Goal Directed  Orientation:  Full (Time, Place, and Person)  Thought Content: Rumination   Suicidal Thoughts:  No  Homicidal Thoughts:  No  Memory:  Immediate;   Good Recent;   Good Remote;   Fair  Judgement:  Good  Insight:  Fair  Psychomotor Activity:  Normal  Concentration:  Concentration: Fair and Attention Span: Fair  Recall:  Good  Fund of Knowledge: Good  Language: Good  Akathisia:  No  Handed:  Right  AIMS (if indicated): not done  Assets:  Communication Skills Desire for Improvement Resilience Social Support Talents/Skills  ADL's:  Intact  Cognition: WNL  Sleep:  Fair   Screenings: GAD-7    Flowsheet Row Office Visit from 07/12/2020 in [facility]  Total GAD-7 Score 3      PHQ2-9    Flowsheet Row Video Visit from 11/14/2020 in [place] Office Visit from 07/12/2020 in [facility] Video Visit from 06/12/2020 in [place] Office Visit from 05/18/2020 in [facility] Office Visit from 04/14/2020  in [facility]  PHQ-2 Total Score '5 6 2 2 4  '$ PHQ-9 Total Score '16 19 7 7 11      '$ Flowsheet Row Video Visit from 11/14/2020 in [place] Video Visit from  06/12/2020 in [place] No Risk No Risk        Assessment and Plan: This patient is a 60 year old female with a history of depression and anxiety.  She has been more depressed due to a lot of stress going on at home.  She will continue with Effexor XR 300 mg daily for depression but add Abilify 2 mg for augmentation.  She will continue Xanax 1 mg at bedtime for anxiety and sleep.  She will return to see me in 3 months   [name], MD 11/14/2020, 11:53 AM

## 2020-11-16 ENCOUNTER — Ambulatory Visit (INDEPENDENT_AMBULATORY_CARE_PROVIDER_SITE_OTHER): Payer: Medicaid Other | Admitting: Gastroenterology

## 2020-11-16 ENCOUNTER — Telehealth (INDEPENDENT_AMBULATORY_CARE_PROVIDER_SITE_OTHER): Payer: Self-pay

## 2020-11-16 ENCOUNTER — Other Ambulatory Visit: Payer: Self-pay

## 2020-11-16 ENCOUNTER — Encounter (INDEPENDENT_AMBULATORY_CARE_PROVIDER_SITE_OTHER): Payer: Self-pay | Admitting: Gastroenterology

## 2020-11-16 DIAGNOSIS — K581 Irritable bowel syndrome with constipation: Secondary | ICD-10-CM | POA: Diagnosis not present

## 2020-11-16 DIAGNOSIS — Z79891 Long term (current) use of opiate analgesic: Secondary | ICD-10-CM | POA: Insufficient documentation

## 2020-11-16 DIAGNOSIS — K589 Irritable bowel syndrome without diarrhea: Secondary | ICD-10-CM | POA: Insufficient documentation

## 2020-11-16 MED ORDER — PEG 3350-KCL-NA BICARB-NACL 420 G PO SOLR: 4000.0000 mL | Freq: Once | ORAL | 0 refills | Status: AC

## 2020-11-16 MED ORDER — LINACLOTIDE 145 MCG PO CAPS: 145.0000 ug | ORAL_CAPSULE | Freq: Every day | ORAL | 3 refills | Status: DC

## 2020-11-16 NOTE — Progress Notes (Signed)
Maylon Peppers, M.D. Gastroenterology & Hepatology [facility] [address] [place], [place] [address] Primary Care Physician: Noreene Larsson, NP [address]  [place] [address] [place] [address]  Referring MD: PCP  Chief Complaint:  constipation and bloating  History of Present Illness: Charlotte Perez is a 60 y.o. female with PMH asthma, ADD, bipolar disorder, COPD, diabetes, depression, chronic opiate use who presents for evaluation of constipation and bloating.  Patient reports that since she had her last C-section in 1983, she has presented issues with constipation. These issues have worsened for the last year as she is not able to move her bowels frequently and possibly has a BM every 1-2 weeks. She states her stools are very hard and she has to strain significantly. She has to do some manual maneuvers sometimes and can take up to 15 minutes to have bloating.  She has been taking Miralax 1 capful every morning without significant improvement. She tooks some Dulcolax yesterday night and was able to move her bowels.  Patient recalls that she has tried in the past Linzess with significant improvement of her symptoms.  She reports she has also presenting significant bloating but she states that it became worse for the last month. Has also presented occasional in the epigastric area, which sometimes gets better after she presses on her abdomen.  She takes hydrocodone 1-2 times a day for back pain when she needs it but sometimes she can last a week without taking the medication.  The patient denies having any nausea, vomiting, fever, chills, hematochezia, melena, hematemesis, diarrhea, jaundice, pruritus. Has lost some weight on purpose by changing her diet.  As part of the investigations for her abdominal pain, she underwent a CT scan of the abdomen and pelvis on 08/10/2020 at [facility].  The official report states it was 6  unremarkable for any acute abnormalities or alterations explain her symptoms, she had bilateral renal cysts.  Most recent blood work-up from 08/10/2020 showed a CMP with a normal calcium of 9.6, potassium of 5.0, normal renal function and liver function test.  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   Arthritis    Asthma    Bipolar disorder ([place])    Chronic back pain    Chronic neck pain    Constipation 09/05/2016   COPD (chronic obstructive pulmonary disease) ([place])    DDD (degenerative disc disease), cervical    DDD (degenerative disc disease), lumbar    Depression    Diabetes mellitus ([place]) 06/27/2010   Diabetes mellitus, type II ([place])    Gallbladder sludge    GERD (gastroesophageal reflux disease)    Hyperlipidemia    Hypertension    Iron deficiency anemia 09/05/2016   Lumbar radiculopathy    Mole (skin)    Neuropathy of foot    Renal cyst    Shingles    Skin lesions, generalized    1.2 CM FLAT FAWN COLOR AT THE T10 AREA JUST RIGHT OF SPINE     Past Surgical History: Past Surgical History:  Procedure Laterality Date   ABDOMINAL HYSTERECTOMY     total hysterectomy  BACK SURGERY     CESAREAN SECTION     x 2   NECK SURGERY      Family History: Family History  Problem Relation Age of Onset   ADD / ADHD Other    COPD Father    Alcohol abuse Father    Depression Daughter    Alcohol abuse Mother    COPD Mother        on oxygen   Colon cancer Paternal Grandfather    Heart disease Sister    Heart disease Brother    Arthritis Sister        knee replacement   Diabetes  Maternal Grandmother    Diabetes Sister     Social History: Social History   Tobacco Use  Smoking Status Passive Smoke Exposure - Never Smoker  Smokeless Tobacco Never   Social History   Substance and Sexual Activity  Alcohol Use No   Social History   Substance and Sexual Activity  Drug Use No    Allergies: Allergies  Allergen Reactions   Cymbalta [Duloxetine Hcl]     Per pt it started making her mouth tingle and could not taste anything   Gabapentin     Itching, fidgety   Meloxicam Other (See Comments)    Tongue tingling, and lost sense of taste.   Tramadol Other (See Comments)    Tongue tingling, and lost sense of taste.    Medications: Current Outpatient Medications  Medication Sig Dispense Refill   ACCU-CHEK FASTCLIX LANCETS MISC EVERY DAY 102 each 2   albuterol (PROVENTIL) (2.5 MG/3ML) 0.083% nebulizer solution Take 3 mLs (2.5 mg total) by nebulization every 6 (six) hours as needed for wheezing or shortness of breath. 75 mL 3   ALPRAZolam (XANAX) 1 MG tablet Take 1 tablet (1 mg total) by mouth at bedtime as needed. 30 tablet 2   ARIPiprazole (ABILIFY) 2 MG tablet Take 1 tablet (2 mg total) by mouth daily. 30 tablet 2   atorvastatin (LIPITOR) 40 MG tablet Take 1 tablet (40 mg total) by mouth daily. 90 tablet 3   cycloSPORINE (RESTASIS) 0.05 % ophthalmic emulsion 1 drop 2 (two) times daily.     empagliflozin (JARDIANCE) 10 MG TABS tablet TAKE ONE (1) TABLET EACH DAY 90 tablet 0   glucose blood (ACCU-CHEK GUIDE) test strip Use daily Dx E11.9 100 each 3   hydrocortisone (ANUSOL-HC) 2.5 % rectal cream Place 1 application rectally 2 (two) times daily. 30 g 0   latanoprost (XALATAN) 0.005 % ophthalmic solution Place 1 drop into both eyes at bedtime.     lisinopril (ZESTRIL) 5 MG tablet TAKE ONE (1) TABLET EACH DAY 90 tablet 0   metFORMIN (GLUCOPHAGE) 1000 MG tablet TAKE ONE TABLET TWICE A DAY WITH FOOD 180 tablet 0   omeprazole (PRILOSEC) 40 MG capsule TAKE ONE (1)  CAPSULE EACH DAY 90 capsule 1   ondansetron (ZOFRAN-ODT) 4 MG disintegrating tablet Take 1 tablet (4 mg total) by mouth every 8 (eight) hours as needed for nausea or vomiting. 20 tablet 0   OXYCODONE ER PO Take by mouth. As needed for lower back pain.     pregabalin (LYRICA) 50 MG capsule TAKE ONE (1) CAPSULE THREE (3) TIMES EACH DAY 90 capsule 1   venlafaxine XR (EFFEXOR-XR) 150 MG 24 hr capsule Take 2 capsules (300 mg total) by mouth at bedtime. 60 capsule 2   No current facility-administered medications for this visit.    Review of Systems: GENERAL: negative for malaise, night  sweats HEENT: No changes in hearing or vision, no nose bleeds or other nasal problems. NECK: Negative for lumps, goiter, pain and significant neck swelling RESPIRATORY: Negative for cough, wheezing CARDIOVASCULAR: Negative for chest pain, leg swelling, palpitations, orthopnea GI: SEE HPI MUSCULOSKELETAL: Negative for joint pain or swelling, back pain, and muscle pain. SKIN: Negative for lesions, rash PSYCH: Negative for sleep disturbance, mood disorder and recent psychosocial stressors. HEMATOLOGY Negative for prolonged bleeding, bruising easily, and swollen nodes. ENDOCRINE: Negative for cold or heat intolerance, polyuria, polydipsia and goiter. NEURO: negative for tremor, gait imbalance, syncope and seizures. The remainder of the review of systems is noncontributory.   Physical Exam: BP 121/78 (BP Location: Left Arm, Patient Position: Sitting, Cuff Size: Small)   Pulse 81   Temp 98.2 F (36.8 C) (Oral)   Ht 4' 10.5" (1.486 m)   Wt 130 lb 8 oz (59.2 kg)   BMI 26.81 kg/m  GENERAL: The patient is AO x3, in no acute distress. HEENT: Head is normocephalic and atraumatic. EOMI are intact. Mouth is well hydrated and without lesions. NECK: Supple. No masses LUNGS: Clear to auscultation. No presence of rhonchi/wheezing/rales. Adequate chest expansion HEART: RRR, normal s1 and s2. ABDOMEN:  mildly tender to  palpation diffusely but worse in epigastric area, no guarding, no peritoneal signs, and nondistended. BS +. No masses. EXTREMITIES: Without any cyanosis, clubbing, rash, lesions or edema. NEUROLOGIC: AOx3, no focal motor deficit. SKIN: no jaundice, no rashes   Imaging/Labs: as above  I personally reviewed and interpreted the available labs, imaging and endoscopic files.  Impression and Plan: Charlotte Perez is a 60 y.o. female with PMH asthma, ADD, bipolar disorder, COPD, diabetes, depression, chronic opiate use who presents for evaluation of constipation and bloating.  Patient has presented chronic history of constipation with recent onset of bloating.  In fact it has been severe enough that she has had to do manual maneuvers to relieve her constipation.  I do consider that she has a diagnosis of IBS-C which may be exacerbated intermittently by the occasional use of opiates.  We will initially manage this with a bowel prep to empty her bowels.  As she has presented significant improvement in the past with Linzess, I will start her on this medication at a dose of 145 mcg every day after finishing her bowel prep.  We will stop MiraLAX for now as he has not significantly improved her symptoms.  We will also check a TSH to evaluate for other reasons for constipation.  I requested the patient to bring the  -Bowel prep - Start Linzess 145 mcg qday after finishing bowel prep - Stop Miralax for now - Check TSH - Patient to bring report of most recent colonoscopy - RTC 3 months  All questions were answered.      Maylon Peppers, MD Gastroenterology and Hepatology [facility]

## 2020-11-16 NOTE — Telephone Encounter (Signed)
Patient aware of all.   Patient called questioning if she needed to take the prep first or the Linzess that were prescribed at todays office visit. Per Dr.Castaneda patient needs to take the prep today and she can start the linzess tomorrow.

## 2020-11-16 NOTE — Patient Instructions (Addendum)
Bowel prep Start Linzess 145 mcg qday Stop Miralax for now Perform blood workup Bring report of most recent colonoscopy

## 2020-11-17 LAB — TSH: TSH: 1.44 mIU/L (ref 0.40–4.50)

## 2020-11-20 DIAGNOSIS — E1140 Type 2 diabetes mellitus with diabetic neuropathy, unspecified: Secondary | ICD-10-CM | POA: Diagnosis not present

## 2020-11-20 DIAGNOSIS — Z79899 Other long term (current) drug therapy: Secondary | ICD-10-CM | POA: Diagnosis not present

## 2020-11-20 DIAGNOSIS — G8929 Other chronic pain: Secondary | ICD-10-CM | POA: Diagnosis not present

## 2020-11-20 DIAGNOSIS — M545 Low back pain, unspecified: Secondary | ICD-10-CM | POA: Diagnosis not present

## 2020-11-23 DIAGNOSIS — Z419 Encounter for procedure for purposes other than remedying health state, unspecified: Secondary | ICD-10-CM | POA: Diagnosis not present

## 2020-11-28 DIAGNOSIS — I10 Essential (primary) hypertension: Secondary | ICD-10-CM | POA: Diagnosis not present

## 2020-11-28 DIAGNOSIS — E119 Type 2 diabetes mellitus without complications: Secondary | ICD-10-CM | POA: Diagnosis not present

## 2020-11-30 ENCOUNTER — Other Ambulatory Visit: Payer: Self-pay

## 2020-11-30 NOTE — Patient Instructions (Signed)
Visit Information  [name] was given information about Medicaid Managed Care team care coordination services as a part of their [facility] Medicaid benefit. Charlotte Perez verbally consented to engagement with the [facility] Managed Care team.   If you are experiencing a medical emergency, please call 911 or report to your local emergency department or urgent care.   If you have a non-emergency medical problem during routine business hours, please contact your provider's office and ask to speak with a nurse.   For questions related to your [facility] health plan, please call: [phone] or go here:[email or web address]  If you would like to schedule transportation through your [facility] plan, please call the following number at least 2 days in advance of your appointment: [phone].  Call the [place] at [phone], at any time, 24 hours a day, 7 days a week. If you are in danger or need immediate medical attention call 911.  If you would like help to quit smoking, call 1-800-QUIT-NOW 306-399-7636) OR Espaol: 1-855-Djelo-Ya (7-106-269-4854) o para ms informacin haga clic aqu or Text READY to 200-400 to register via text  [name] - following are the goals we discussed in your visit today:   Goals Addressed             This Visit's Progress    RNCM - Glycemic Management       Timeframe:  Long-Range Goal Priority:  High Start Date:   11/30/2020                       Expected End Date: 03/30/2021          Patient Goals/Self-Care Activities: Patient will self administer medications as prescribed Patient will attend all scheduled provider appointments Patient will call pharmacy for medication refills Patient will continue to perform ADL's independently Patient will continue to perform IADL's independently Patient will call provider office for new concerns or questions Patient will work with BSW to address care coordination  needs and will continue to work with the clinical team to address health care and disease management related needs.                    RNCM - Hypertension monitored and managed       Timeframe:  Long-Range Goal Priority:  High Start Date:   11/30/2020                          Expected End Date:   03/30/2021         Patient Goals/Self-Care Activities: Patient will self administer medications as prescribed Patient will attend all scheduled provider appointments Patient will call pharmacy for medication refills Patient will continue to perform ADL's independently Patient will continue to perform IADL's independently Patient will call provider office for new concerns or questions Patient will work with BSW to address care coordination needs and will continue to work with the clinical team to address health care and disease management related needs.                   RNCM - Management of GI Issues/IBS with constipation       Timeframe:  Long-Range Goal Priority:  High Start Date: 11/30/2020                            Expected End Date:  03/30/2021   Patient Goals/Self-Care Activities:  Patient will self administer medications as prescribed Patient will attend all scheduled provider appointments Patient will call pharmacy for medication refills Patient will continue to perform ADL's independently Patient will continue to perform IADL's independently Patient will call provider office for new concerns or questions Patient will work with BSW to address care coordination needs and will continue to work with the clinical team to address health care and disease management related needs.                                 The patient verbalized understanding of instructions provided today and agreed to receive a mailed copy of patient instruction and/or educational materials.  The Managed Medicaid care management team will reach out to the patient again over the next 30 days.   Salvatore Marvel RN,  BSN Community Care Coordinator [place] [facility] Mobile: [phone]   Following is a copy of your plan of care:  Patient Care Plan: RNCM Plan of Care     Problem Identified: RNCM Plan of Care for Care Management and Care Coordination Needs for DM, HTN, GI Issues      Long-Range Goal: RNCM Plan of Care for Care Management and Care Coordination Needs   Start Date: 11/30/2020  Expected End Date: 03/30/2021  Priority: High  Note:   Current Barriers:  Knowledge Deficits related to plan of care for management of HTN, DMII, and GI Issues/IBS with Constipation  Care Coordination needs related to Limited social support, Social Isolation, Literacy concerns, Lacks knowledge of community resource: available foot doctors who accept Managed Medicaid patients, and is in need of referrals to foot doctor who accepts Managed Medicaid patients  Chronic Disease Management support and education needs related to HTN, DMII, and GI Issues/IBS with Constipation Literacy barriers No Advanced Directives in place  RNCM Clinical Goal(s):  Patient will verbalize understanding of plan for management of HTN, DMII, and GI Issues/IBS with Constipation verbalize basic understanding of HTN, DMII, and GI Issues/IBS with Constipation disease process and self health management plan   take all medications exactly as prescribed and will call provider for medication related questions attend all scheduled medical appointments: GI Doctor appointment on 03/05/21 demonstrate ongoing adherence to prescribed treatment plan for HTN, DMII, and GI Issues/IBS with Constipation as evidenced by daily monitoring and recording of CBG  adherence to ADA/ carb modified diet exercise 3 days/week adherence to prescribed medication regimen contacting provider for new or worsened symptoms or questions and monitor blood pressure at home 3 times a week.   continue to work with RN Care Manager to address care management and care  coordination needs related to HTN, DMII, and GI Issues/IBS with Constipation  work with Education officer, museum to address Limited social support, Environmental consultant, Literacy concerns, and Lacks knowledge of community resource: of available foot doctors who accept Managed Medicaid patients related to the management of HTN, DMII, and GI Issues/IBS with Constipation  through collaboration with Consulting civil engineer, provider, and care team.   Interventions: Inter-disciplinary care team collaboration (see longitudinal plan of care) Evaluation of current treatment plan related to self management and patient's adherence to plan as established by provider   Diabetes:  (Status: New goal.) Lab Results  Component Value Date   HGBA1C 6.7 (H) 05/11/2020  Assessed patient's understanding of A1c goal: <7% Provided education to patient about basic DM disease process; Reviewed medications with patient and discussed importance of medication  adherence;        Counseled on importance of regular laboratory monitoring as prescribed;        Discussed plans with patient for ongoing care management follow up and provided patient with direct contact information for care management team;      Provided patient with education related to hypo and hyperglycemia and importance of correct treatment;       Advised patient, providing education and rationale, to check cbg 3 times daily and record        call provider for findings outside established parameters;       Referral made to social work team for assistance with finding a foot doctor who accepts managed Medicaid patients;      Review of patient status, including review of consultants reports, relevant laboratory and other test results, and medications completed;       Screening for signs and symptoms of depression related to chronic disease state;        Assessed social determinant of health barriers;           GI Issues / IBS with Constipation (Status: New goal.) Evaluation of  current treatment plan related to  GI Issues / IBS with constipation , BSW assistance with Limited social support, Social Isolation, Literacy concerns, and Lacks knowledge of community resource: finding a foot doctor who accepts Managed Medicaid patients.  self-management and patient's adherence to plan as established by provider. Discussed plans with patient for ongoing care management follow up and provided patient with direct contact information for care management team Advised patient to continue taking Linzess as ordered; Reviewed medications with patient and discussed importance of taking medications, especially Linzess as prescribed; Screening for signs and symptoms of depression related to chronic disease state;  Assessed social determinant of health barriers;   Hypertension: (Status: New goal.) Last practice recorded BP readings:  BP Readings from Last 3 Encounters:  11/16/20 121/78  07/12/20 120/72  05/18/20 107/71  Most recent eGFR/CrCl: No results found for: EGFR  No components found for: CRCL  Evaluation of current treatment plan related to hypertension self management and patient's adherence to plan as established by provider;   Reviewed prescribed diet Low sodium. Reviewed medications with patient and discussed importance of compliance;  Continue to monitor blood pressure at home 3 times a week. Counseled on the importance of exercise goals with target of 150 minutes per week Discussed plans with patient for ongoing care management follow up and provided patient with direct contact information for care management team; Advised patient, providing education and rationale, to monitor blood pressure daily and record, calling PCP for findings outside established parameters;  Screening for signs and symptoms of depression related to chronic disease state;  Assessed social determinant of health barriers;   Patient Goals/Self-Care Activities: Patient will self administer medications  as prescribed Patient will attend all scheduled provider appointments Patient will call pharmacy for medication refills Patient will continue to perform ADL's independently Patient will continue to perform IADL's independently Patient will call provider office for new concerns or questions Patient will work with BSW to address care coordination needs and will continue to work with the clinical team to address health care and disease management related needs.

## 2020-11-30 NOTE — Patient Outreach (Addendum)
Medicaid Managed Care   Nurse Care Manager Note  9/8/Perez Name:  Charlotte Perez MRN:  [id number] DOB:  [birth year]  Charlotte Perez is an 60 y.o. year old female who is Perez primary patient of Charlotte Larsson, NP.  The [facility] Managed Care Coordination team was consulted for assistance with:    HTN DMII GI Issues / IBS with constipation  Charlotte Perez was given information about Medicaid Managed Care Coordination team services today. Charlotte Perez Patient agreed to services and verbal consent obtained.  Engaged with patient by telephone for initial visit in response to provider referral for case management and/or care coordination services.   Assessments/Interventions:  Review of past medical history, allergies, medications, health status, including review of consultants reports, laboratory and other test data, was performed as part of comprehensive evaluation and provision of chronic care management services.  SDOH (Social Determinants of Health) assessments and interventions performed: SDOH Interventions    Flowsheet Row Most Recent Value  SDOH Interventions   SDOH Interventions for the Following Domains Physical Activity  Food Insecurity Interventions Intervention Not Indicated  Financial Strain Interventions Intervention Not Indicated  Housing Interventions Intervention Not Indicated  Physical Activity Interventions Other (Comments)  Stress Interventions Intervention Not Indicated  Social Connections Interventions Intervention Not Indicated  Transportation Interventions Intervention Not Indicated       Care Plan  Allergies  Allergen Reactions   Cymbalta [Duloxetine Hcl]     Per pt it started making her mouth tingle and could not taste anything   Gabapentin     Itching, fidgety   Meloxicam Other (See Comments)    Tongue tingling, and lost sense of taste.   Tramadol Other (See Comments)    Tongue tingling, and lost sense of taste.    Medications Reviewed Today      Reviewed by Charlotte Rise, RN (Case Manager) on 11/30/20 at 1029  Med List Status: <None>   Medication Order Taking? Sig Documenting Provider Last Dose Status Informant  Charlotte Perez [id number] Yes EVERY DAY Charlotte Balloon, FNP Taking Active   albuterol (PROVENTIL) (2.5 MG/3ML) 0.083% nebulizer solution [id number] Yes Take 3 mLs (2.5 mg total) by nebulization every 6 (six) hours as needed for wheezing or shortness of breath. Perez, Charlotte Kaufmann, MD Taking Active   ALPRAZolam Charlotte Perez) 1 MG tablet [id number] No Take 1 tablet (1 mg total) by mouth at bedtime as needed.  Patient not taking: Reported on 9/8/Perez   Charlotte Spring, MD Not Taking Active            Med Note [facility], Charlotte Perez 10:11 AM) Taking 1/2 tablet  ARIPiprazole (ABILIFY) 2 MG tablet [id number] No Take 1 tablet (2 mg total) by mouth daily.  Patient not taking: Reported on 9/8/Perez   Charlotte Spring, MD Not Taking Active   atorvastatin (LIPITOR) 40 MG tablet [id number] Yes Take 1 tablet (40 mg total) by mouth daily. Charlotte Larsson, NP Taking Active   cycloSPORINE (RESTASIS) 0.05 % ophthalmic emulsion [id number] Yes 1 drop 2 (two) times daily. [provider] Taking Active   empagliflozin (JARDIANCE) 10 MG TABS tablet [id number] No TAKE ONE (1) TABLET EACH DAY  Patient not taking: Reported on 9/8/Perez   Charlotte Larsson, NP Not Taking Active            Med Note [facility], Charlotte Perez   Thu Sep 8, Perez 10:15 AM) Taking 25 mg tablet  glucose blood (  ACCU-CHEK GUIDE) test strip [id number] Yes Use daily Dx E11.9 Charlotte Larsson, NP Taking Active   hydrocortisone (ANUSOL-HC) 2.5 % rectal cream [id number] No Place 1 application rectally 2 (two) times daily.  Patient not taking: Reported on 9/8/Perez   Charlotte Larsson, NP Not Taking Active   latanoprost (XALATAN) 0.005 % ophthalmic solution [id number] Yes Place 1 drop into both eyes at bedtime. [provider] Taking Active   linaclotide  Rolan Lipa) 145 MCG CAPS capsule [id number] Yes Take 1 capsule (145 mcg total) by mouth daily before breakfast. Charlotte Perez, Daniel, MD Taking Active   lisinopril (ZESTRIL) 5 MG tablet [id number] No TAKE ONE (1) TABLET EACH DAY  Patient not taking: Reported on 9/8/Perez   Charlotte Larsson, NP Not Taking Active            Med Note [facility], Charlotte Perez   Thu Sep 8, Perez 10:15 AM) Taking 1/2 tablet  magnesium gluconate (MAGONATE) 500 MG tablet [id number] Yes Take 500 mg by mouth 2 (two) times daily. [provider] Taking Active            Med Note [facility], Charlotte Perez 10:28 AM) Taking 250 mg tablet 2 times daily  metFORMIN (GLUCOPHAGE) 1000 MG tablet [id number] Yes TAKE ONE TABLET TWICE Perez DAY WITH FOOD Charlotte Larsson, NP Taking Active   omeprazole (PRILOSEC) 40 MG capsule [id number] Yes TAKE ONE (1) CAPSULE EACH DAY Charlotte Larsson, NP Taking Active   ondansetron (ZOFRAN-ODT) 4 MG disintegrating tablet [id number] Yes Take 1 tablet (4 mg total) by mouth every 8 (eight) hours as needed for nausea or vomiting. Charlotte Rasmussen, MD Taking Active   OXYCODONE ER PO [id number] Yes Take by mouth. As needed for lower back pain. [provider] Taking Active            Med Note Charlotte Perez, Charlotte Perez   Tue Jun 09, 2018  8:51 AM) Given by pain management provider.  pregabalin (LYRICA) 50 MG capsule [id number] No TAKE ONE (1) CAPSULE THREE (3) TIMES EACH DAY  Patient not taking: Reported on 9/8/Perez   Charlotte Larsson, NP Not Taking Active   venlafaxine XR (EFFEXOR-XR) 150 MG 24 hr capsule [id number] Yes Take 2 capsules (300 mg total) by mouth at bedtime. Charlotte Spring, MD Taking Active   vitamin B-12 (CYANOCOBALAMIN) 1000 MCG tablet [id number] Yes Take 1,000 mcg by mouth daily. Take 2 tablets daily [provider] Taking Active             Patient Active Problem List   Diagnosis Date Noted   IBS (irritable bowel syndrome) 08/25/Perez   Chronic prescription opiate use  08/25/Perez   Hemorrhoid 02/24/Perez   Abdominal pain 01/21/Perez   Moderate single current episode of major depressive disorder ([place]) 06/09/2018   Constipation 09/05/2016   Chronic low back pain 06/05/2016   Diabetic peripheral neuropathy ([place]) 12/01/2015   Overweight (BMI 25.0-29.9) 08/31/2015   Migraine 12/26/2014   GAD (generalized anxiety disorder) 10/25/2013   GERD (gastroesophageal reflux disease) 10/25/2013   High blood pressure 06/27/2010   Hyperlipidemia associated with type 2 diabetes mellitus ([place]) 06/27/2010    Conditions to be addressed/monitored per PCP order:  HTN, DMII, and GI Issues / IBS with constipation  Care Plan : [facility] Plan of Care  Updates made by Charlotte Rise, RN since 9/8/Perez 12:00 AM     Problem: RNCM Plan of Care for Care Management and Care Coordination Needs for  DM, HTN, GI Issues      Long-Range Goal: RNCM Plan of Care for Care Management and Care Coordination Needs   Start Date: 9/8/Perez  Expected End Date: 03/30/2021  Priority: High  Note:   Current Barriers:  Knowledge Deficits related to plan of care for management of HTN, DMII, and GI Issues/IBS with Constipation  Care Coordination needs related to Limited social support, Social Isolation, Literacy concerns, Lacks knowledge of community resource: available foot doctors who accept Managed Medicaid patients, and is in need of referrals to foot doctor who accepts Managed Medicaid patients  Chronic Disease Management support and education needs related to HTN, DMII, and GI Issues/IBS with Constipation Literacy barriers No Advanced Directives in place  RNCM Clinical Goal(s):  Patient will verbalize understanding of plan for management of HTN, DMII, and GI Issues/IBS with Constipation verbalize basic understanding of HTN, DMII, and GI Issues/IBS with Constipation disease process and self health management plan   take all medications exactly as prescribed and will call provider for medication  related questions attend all scheduled medical appointments: GI Doctor appointment on 03/05/21 demonstrate ongoing adherence to prescribed treatment plan for HTN, DMII, and GI Issues/IBS with Constipation as evidenced by daily monitoring and recording of CBG  adherence to ADA/ carb modified diet exercise 3 days/week adherence to prescribed medication regimen contacting provider for new or worsened symptoms or questions and monitor blood pressure at home 3 times Perez week.   continue to work with RN Care Manager to address care management and care coordination needs related to HTN, DMII, and GI Issues/IBS with Constipation  work with Education officer, museum to address Limited social support, Environmental consultant, Literacy concerns, and Lacks knowledge of community resource: of available foot doctors who accept Managed Medicaid patients related to the management of HTN, DMII, and GI Issues/IBS with Constipation  through collaboration with Consulting civil engineer, provider, and care team.   Interventions: Inter-disciplinary care team collaboration (see longitudinal plan of care) Evaluation of current treatment plan related to self management and patient's adherence to plan as established by provider   Diabetes:  (Status: New goal.) Lab Results  Component Value Date   HGBA1C 6.7 (H) 02/17/Perez  Assessed patient's understanding of A1c goal: <7% Provided education to patient about basic DM disease process; Reviewed medications with patient and discussed importance of medication adherence;        Counseled on importance of regular laboratory monitoring as prescribed;        Discussed plans with patient for ongoing care management follow up and provided patient with direct contact information for care management team;      Provided patient with education related to hypo and hyperglycemia and importance of correct treatment;       Advised patient, providing education and rationale, to check cbg 3 times daily and record         call provider for findings outside established parameters;       Referral made to social work team for assistance with finding Perez foot doctor who accepts managed Medicaid patients;      Review of patient status, including review of consultants reports, relevant laboratory and other test results, and medications completed;       Screening for signs and symptoms of depression related to chronic disease state;        Assessed social determinant of health barriers;           GI Issues / IBS with Constipation (Status: New goal.) Evaluation of current treatment  plan related to  GI Issues / IBS with constipation , BSW assistance with Limited social support, Social Isolation, Literacy concerns, and Lacks knowledge of community resource: finding Perez foot doctor who accepts Managed Medicaid patients.  self-management and patient's adherence to plan as established by provider. Discussed plans with patient for ongoing care management follow up and provided patient with direct contact information for care management team Advised patient to continue taking Linzess as ordered; Reviewed medications with patient and discussed importance of taking medications, especially Linzess as prescribed; Screening for signs and symptoms of depression related to chronic disease state;  Assessed social determinant of health barriers;   Hypertension: (Status: New goal.) Last practice recorded BP readings:  BP Readings from Last 3 Encounters:  11/16/20 121/78  07/12/20 120/72  05/18/20 107/71  Most recent eGFR/CrCl: No results found for: EGFR  No components found for: CRCL  Evaluation of current treatment plan related to hypertension self management and patient's adherence to plan as established by provider;   Reviewed prescribed diet Low sodium. Reviewed medications with patient and discussed importance of compliance;  Continue to monitor blood pressure at home 3 times Perez week. Counseled on the importance of exercise goals  with target of 150 minutes per week Discussed plans with patient for ongoing care management follow up and provided patient with direct contact information for care management team; Advised patient, providing education and rationale, to monitor blood pressure daily and record, calling PCP for findings outside established parameters;  Screening for signs and symptoms of depression related to chronic disease state;  Assessed social determinant of health barriers;   Patient Goals/Self-Care Activities: Patient will self administer medications as prescribed Patient will attend all scheduled provider appointments Patient will call pharmacy for medication refills Patient will continue to perform ADL's independently Patient will continue to perform IADL's independently Patient will call provider office for new concerns or questions Patient will work with BSW to address care coordination needs and will continue to work with the clinical team to address health care and disease management related needs.         Follow Up:  Patient agrees to Care Plan and Follow-up.  Plan: The Managed Medicaid care management team will reach out to the patient again over the next 30 days.  Date/time of next scheduled RN care management/care coordination outreach:  10/6/Perez at 9:00 am  [place] [place] [facility] Mobile: [phone]

## 2020-12-08 ENCOUNTER — Other Ambulatory Visit: Payer: Self-pay

## 2020-12-08 NOTE — Patient Outreach (Signed)
Medicaid Managed Care Social Work Note  12/08/2020 Name:  Charlotte Perez MRN:  [id number] DOB:  Feb 07, 1961  Charlotte Perez is an 60 y.o. year old female who is Perez primary patient of [place], Tyler Deis, Perez.  The [facility] Managed Care Coordination team was consulted for assistance with:   foot doctor  Charlotte Perez was given information about Medicaid Managed Care Coordination team services today. Charlotte Perez Patient agreed to services and verbal consent obtained.  Engaged with patient  for by telephone forinitial visit in response to referral for case management and/or care coordination services.   Assessments/Interventions:  Review of past medical history, allergies, medications, health status, including review of consultants reports, laboratory and other test data, was performed as part of comprehensive evaluation and provision of chronic care management services.  SDOH: (Social Determinant of Health) assessments and interventions performed:  BSW contacted patient regarding foot doctors. Patient stated she went to Perez free appoinment however they did not accept Medicaid. BSW contacted 2 podiatrist in [place] and they do not accept Medicaid. BSW will continue to research. Patient did state she will go as far as [place]. Patient stated she would also like more information on her extra benefits for [facility].  Advanced Directives Status:  Not addressed in this encounter.  Care Plan                 Allergies  Allergen Reactions   Cymbalta [Duloxetine Hcl]     Per pt it started making her mouth tingle and could not taste anything   Gabapentin     Itching, fidgety   Meloxicam Other (See Comments)    Tongue tingling, and lost sense of taste.   Tramadol Other (See Comments)    Tongue tingling, and lost sense of taste.    Medications Reviewed Today     Reviewed by Charlotte Rise, RN (Case Manager) on 11/30/20 at 1029  Med List Status: <None>   Medication Order Taking?  Sig Documenting Provider Last Dose Status Informant  Charlotte Perez [id number] Yes EVERY DAY Charlotte Balloon, FNP Taking Active   albuterol (PROVENTIL) (2.5 MG/3ML) 0.083% nebulizer solution [id number] Yes Take 3 mLs (2.5 mg total) by nebulization every 6 (six) hours as needed for wheezing or shortness of breath. Charlotte Perez Taking Active   ALPRAZolam Duanne Moron) 1 MG tablet [id number] No Take 1 tablet (1 mg total) by mouth at bedtime as needed.  Patient not taking: Reported on 11/30/2020   Charlotte Spring, Perez Not Taking Active            Med Note [facility], Albertine Patricia Nov 30, 2020 10:11 AM) Taking 1/2 tablet  ARIPiprazole (ABILIFY) 2 MG tablet [id number] No Take 1 tablet (2 mg total) by mouth daily.  Patient not taking: Reported on 11/30/2020   Charlotte Spring, Perez Not Taking Active   atorvastatin (LIPITOR) 40 MG tablet [id number] Yes Take 1 tablet (40 mg total) by mouth daily. Charlotte Larsson, NP Taking Active   cycloSPORINE (RESTASIS) 0.05 % ophthalmic emulsion [id number] Yes 1 drop 2 (two) times daily. Provider, Historical, Perez Taking Active   empagliflozin (JARDIANCE) 10 MG TABS tablet [id number] No TAKE ONE (1) TABLET EACH DAY  Patient not taking: Reported on 11/30/2020   Charlotte Larsson, NP Not Taking Active            Med Note [facility], [facility] Perez   Thu Nov 30, 2020 10:15 AM) Taking 25  mg tablet  glucose blood (ACCU-CHEK GUIDE) test strip [id number] Yes Use daily Dx E11.9 Charlotte Larsson, NP Taking Active   hydrocortisone (ANUSOL-HC) 2.5 % rectal cream [id number] No Place 1 application rectally 2 (two) times daily.  Patient not taking: Reported on 11/30/2020   Charlotte Larsson, NP Not Taking Active   latanoprost (XALATAN) 0.005 % ophthalmic solution [id number] Yes Place 1 drop into both eyes at bedtime. Provider, Historical, Perez Taking Active   linaclotide Rolan Lipa) 145 MCG CAPS capsule [id number] Yes Take 1 capsule (145 mcg total) by mouth daily before breakfast. Charlotte Perez, Daniel, Perez Taking Active   lisinopril (ZESTRIL) 5 MG tablet [id number] No TAKE ONE (1) TABLET EACH DAY  Patient not taking: Reported on 11/30/2020   Charlotte Larsson, NP Not Taking Active            Med Note [facility], Charlotte Perez   Thu Nov 30, 2020 10:15 AM) Taking 1/2 tablet  magnesium gluconate (MAGONATE) 500 MG tablet [id number] Yes Take 500 mg by mouth 2 (two) times daily. Provider, Historical, Perez Taking Active            Med Note [facility], Albertine Patricia Nov 30, 2020 10:28 AM) Taking 250 mg tablet 2 times daily  metFORMIN (GLUCOPHAGE) 1000 MG tablet [id number] Yes TAKE ONE TABLET TWICE Perez DAY WITH FOOD Charlotte Larsson, NP Taking Active   omeprazole (PRILOSEC) 40 MG capsule [id number] Yes TAKE ONE (1) CAPSULE EACH DAY Charlotte Larsson, NP Taking Active   ondansetron (ZOFRAN-ODT) 4 MG disintegrating tablet [id number] Yes Take 1 tablet (4 mg total) by mouth every 8 (eight) hours as needed for nausea or vomiting. Charlotte Rasmussen, Perez Taking Active   OXYCODONE ER PO [id number] Yes Take by mouth. As needed for lower back pain. Provider, Historical, Perez Taking Active            Med Note Joaquim Lai, Cyril Loosen   Tue Jun 09, 2018  8:51 AM) Given by pain management provider.  pregabalin (LYRICA) 50 MG capsule [id number] No TAKE ONE (1) CAPSULE THREE (3) TIMES EACH DAY  Patient not taking: Reported on 11/30/2020   Charlotte Larsson, NP Not Taking Active   venlafaxine XR (EFFEXOR-XR) 150 MG 24 hr capsule [id number] Yes Take 2 capsules (300 mg total) by mouth at bedtime. Charlotte Spring, Perez Taking Active   vitamin B-12 (CYANOCOBALAMIN) 1000 MCG tablet [id number] Yes Take 1,000 mcg by mouth daily. Take 2 tablets daily Provider, Historical, Perez Taking Active             Patient Active Problem List   Diagnosis Date Noted   IBS (irritable bowel syndrome) 11/16/2020   Chronic prescription opiate use 11/16/2020   Hemorrhoid 05/18/2020   Abdominal pain 04/14/2020   Moderate single current episode of major depressive  disorder ([place]) 06/09/2018   Constipation 09/05/2016   Chronic low back pain 06/05/2016   Diabetic peripheral neuropathy ([place]) 12/01/2015   Overweight (BMI 25.0-29.9) 08/31/2015   Migraine 12/26/2014   GAD (generalized anxiety disorder) 10/25/2013   GERD (gastroesophageal reflux disease) 10/25/2013   High blood pressure 06/27/2010   Hyperlipidemia associated with type 2 diabetes mellitus ([place]) 06/27/2010    Conditions to be addressed/monitored per PCP order:   foot doctor  Care Plan : [facility] Plan of Care  Updates made by Ethelda Chick since 12/08/2020 12:00 AM     Problem: RNCM Plan of Care for Care Management and Care Coordination Needs for DM, HTN,  GI Issues      Long-Range Goal: RNCM Plan of Care for Care Management and Care Coordination Needs   Start Date: 11/30/2020  Expected End Date: 03/30/2021  Priority: High  Note:   Current Barriers:  Knowledge Deficits related to plan of care for management of HTN, DMII, and GI Issues/IBS with Constipation  Care Coordination needs related to Limited social support, Social Isolation, Literacy concerns, Lacks knowledge of community resource: available foot doctors who accept Managed Medicaid patients, and is in need of referrals to foot doctor who accepts Managed Medicaid patients  Chronic Disease Management support and education needs related to HTN, DMII, and GI Issues/IBS with Constipation Literacy barriers No Advanced Directives in place  RNCM Clinical Goal(s):  Patient will verbalize understanding of plan for management of HTN, DMII, and GI Issues/IBS with Constipation verbalize basic understanding of HTN, DMII, and GI Issues/IBS with Constipation disease process and self health management plan   take all medications exactly as prescribed and will call provider for medication related questions attend all scheduled medical appointments: GI Doctor appointment on 03/05/21 demonstrate ongoing adherence to prescribed treatment plan for HTN,  DMII, and GI Issues/IBS with Constipation as evidenced by daily monitoring and recording of CBG  adherence to ADA/ carb modified diet exercise 3 days/week adherence to prescribed medication regimen contacting provider for new or worsened symptoms or questions and monitor blood pressure at home 3 times Perez week.   continue to work with RN Care Manager to address care management and care coordination needs related to HTN, DMII, and GI Issues/IBS with Constipation  work with Education officer, museum to address Limited social support, Environmental consultant, Literacy concerns, and Lacks knowledge of community resource: of available foot doctors who accept Managed Medicaid patients related to the management of HTN, DMII, and GI Issues/IBS with Constipation  through collaboration with Consulting civil engineer, provider, and care team.   Interventions: Inter-disciplinary care team collaboration (see longitudinal plan of care) Evaluation of current treatment plan related to self management and patient's adherence to plan as established by provider   Diabetes:  (Status: New goal.) Lab Results  Component Value Date   HGBA1C 6.7 (H) 05/11/2020  Assessed patient's understanding of A1c goal: <7% Provided education to patient about basic DM disease process; Reviewed medications with patient and discussed importance of medication adherence;        Counseled on importance of regular laboratory monitoring as prescribed;        Discussed plans with patient for ongoing care management follow up and provided patient with direct contact information for care management team;      Provided patient with education related to hypo and hyperglycemia and importance of correct treatment;       Advised patient, providing education and rationale, to check cbg 3 times daily and record        call provider for findings outside established parameters;       Referral made to social work team for assistance with finding Perez foot doctor who accepts managed  Medicaid patients;      Review of patient status, including review of consultants reports, relevant laboratory and other test results, and medications completed;       Screening for signs and symptoms of depression related to chronic disease state;        Assessed social determinant of health barriers;           GI Issues / IBS with Constipation (Status: New goal.) Evaluation of current treatment plan related  to  GI Issues / IBS with constipation , BSW assistance with Limited social support, Social Isolation, Literacy concerns, and Lacks knowledge of community resource: finding Perez foot doctor who accepts Managed Medicaid patients.  self-management and patient's adherence to plan as established by provider. Discussed plans with patient for ongoing care management follow up and provided patient with direct contact information for care management team Advised patient to continue taking Linzess as ordered; Reviewed medications with patient and discussed importance of taking medications, especially Linzess as prescribed; Screening for signs and symptoms of depression related to chronic disease state;  Assessed social determinant of health barriers;  BSW contacted patient regarding foot doctors. Patient stated she went to [facility] free appoinment however they did not accept Medicaid. BSW contacted 2 podiatrist in [place] and they do not accept Medicaid. BSW will continue to research. Patient did state she will go as far as [place]. Patient stated she would also like more information on her extra benefits for [facility].  Hypertension: (Status: New goal.) Last practice recorded BP readings:  BP Readings from Last 3 Encounters:  11/16/20 121/78  07/12/20 120/72  05/18/20 107/71  Most recent eGFR/CrCl: No results found for: EGFR  No components found for: CRCL  Evaluation of current treatment plan related to hypertension self management and patient's adherence to plan as established by provider;    Reviewed prescribed diet Low sodium. Reviewed medications with patient and discussed importance of compliance;  Continue to monitor blood pressure at home 3 times Perez week. Counseled on the importance of exercise goals with target of 150 minutes per week Discussed plans with patient for ongoing care management follow up and provided patient with direct contact information for care management team; Advised patient, providing education and rationale, to monitor blood pressure daily and record, calling PCP for findings outside established parameters;  Screening for signs and symptoms of depression related to chronic disease state;  Assessed social determinant of health barriers;   Patient Goals/Self-Care Activities: Patient will self administer medications as prescribed Patient will attend all scheduled provider appointments Patient will call pharmacy for medication refills Patient will continue to perform ADL's independently Patient will continue to perform IADL's independently Patient will call provider office for new concerns or questions Patient will work with BSW to address care coordination needs and will continue to work with the clinical team to address health care and disease management related needs.         Follow up:  Patient agrees to Care Plan and Follow-up.  Plan: The Managed Medicaid care management team will reach out to the patient again over the next 7 days.  Date/time of next scheduled Social Work care management/care coordination outreach:  12/19/20  Mickel Fuchs, Arita Miss, [place] Medicaid Team  [phone]

## 2020-12-08 NOTE — Patient Instructions (Signed)
Visit Information  Ms. Hoeppner was given information about Medicaid Managed Care team care coordination services as a part of their [facility] Medicaid benefit. [name] verbally consented to engagement with [facility] team.   If you are experiencing a medical emergency, please call 911 or report to your local emergency department or urgent care.   If you have a non-emergency medical problem during routine business hours, please contact your provider's office and ask to speak with a nurse.   For questions related to your [facility] health plan, please call: [phone] or go here:[email or web address]  If you would like to schedule transportation through your [facility] plan, please call the following number at least 2 days in advance of your appointment: [phone].  Call the [place] at [phone], at any time, 24 hours a day, 7 days a week. If you are in danger or need immediate medical attention call 911.  If you would like help to quit smoking, call 1-800-QUIT-NOW (307)173-0087) OR Espaol: 1-855-Djelo-Ya (7-948-016-5537) o para ms informacin haga clic aqu or Text READY to 200-400 to register via text  Ms. Brun - following are the goals we discussed in your visit today:   Goals Addressed   None      Social Worker will follow up in 7 days.   Mickel Fuchs, BSW, [place]  High Risk Managed Medicaid Team  [phone]   Following is a copy of your plan of care:  Patient Care Plan: RNCM Plan of Care     Problem Identified: RNCM Plan of Care for Care Management and Care Coordination Needs for DM, HTN, GI Issues      Long-Range Goal: RNCM Plan of Care for Care Management and Care Coordination Needs   Start Date: 11/30/2020  Expected End Date: 03/30/2021  Priority: High  Note:   Current Barriers:  Knowledge Deficits related to plan of care for management of HTN, DMII,  and GI Issues/IBS with Constipation  Care Coordination needs related to Limited social support, Social Isolation, Literacy concerns, Lacks knowledge of community resource: available foot doctors who accept Managed Medicaid patients, and is in need of referrals to foot doctor who accepts Managed Medicaid patients  Chronic Disease Management support and education needs related to HTN, DMII, and GI Issues/IBS with Constipation Literacy barriers No Advanced Directives in place  RNCM Clinical Goal(s):  Patient will verbalize understanding of plan for management of HTN, DMII, and GI Issues/IBS with Constipation verbalize basic understanding of HTN, DMII, and GI Issues/IBS with Constipation disease process and self health management plan   take all medications exactly as prescribed and will call provider for medication related questions attend all scheduled medical appointments: GI Doctor appointment on 03/05/21 demonstrate ongoing adherence to prescribed treatment plan for HTN, DMII, and GI Issues/IBS with Constipation as evidenced by daily monitoring and recording of CBG  adherence to ADA/ carb modified diet exercise 3 days/week adherence to prescribed medication regimen contacting provider for new or worsened symptoms or questions and monitor blood pressure at home 3 times a week.   continue to work with RN Care Manager to address care management and care coordination needs related to HTN, DMII, and GI Issues/IBS with Constipation  work with Education officer, museum to address Limited social support, Social Isolation, Literacy concerns, and Lacks knowledge of community resource: of available foot doctors who accept Managed Medicaid patients related to the management of HTN, DMII, and GI Issues/IBS with Constipation  through collaboration with Consulting civil engineer, provider, and care team.   Interventions: Inter-disciplinary care team collaboration (see longitudinal plan of care) Evaluation of current treatment plan  related to self management and patient's adherence to plan as established by provider   Diabetes:  (Status: New goal.) Lab Results  Component Value Date   HGBA1C 6.7 (H) 05/11/2020  Assessed patient's understanding of A1c goal: <7% Provided education to patient about basic DM disease process; Reviewed medications with patient and discussed importance of medication adherence;        Counseled on importance of regular laboratory monitoring as prescribed;        Discussed plans with patient for ongoing care management follow up and provided patient with direct contact information for care management team;      Provided patient with education related to hypo and hyperglycemia and importance of correct treatment;       Advised patient, providing education and rationale, to check cbg 3 times daily and record        call provider for findings outside established parameters;       Referral made to social work team for assistance with finding a foot doctor who accepts managed Medicaid patients;      Review of patient status, including review of consultants reports, relevant laboratory and other test results, and medications completed;       Screening for signs and symptoms of depression related to chronic disease state;        Assessed social determinant of health barriers;           GI Issues / IBS with Constipation (Status: New goal.) Evaluation of current treatment plan related to  GI Issues / IBS with constipation , BSW assistance with Limited social support, Social Isolation, Literacy concerns, and Lacks knowledge of community resource: finding a foot doctor who accepts Managed Medicaid patients.  self-management and patient's adherence to plan as established by provider. Discussed plans with patient for ongoing care management follow up and provided patient with direct contact information for care management team Advised patient to continue taking Linzess as ordered; Reviewed medications with  patient and discussed importance of taking medications, especially Linzess as prescribed; Screening for signs and symptoms of depression related to chronic disease state;  Assessed social determinant of health barriers;  BSW contacted patient regarding foot doctors. Patient stated she went to a free appoinment however they did not accept Medicaid. BSW [place] 2 podiatrist in [place] and they do not accept Medicaid. BSW will continue to research. Patient did state she will go as far as [place]. Patient stated she would also like more information on her extra benefits for [facility].  Hypertension: (Status: New goal.) Last practice recorded BP readings:  BP Readings from Last 3 Encounters:  11/16/20 121/78  07/12/20 120/72  05/18/20 107/71  Most recent eGFR/CrCl: No results found for: EGFR  No components found for: CRCL  Evaluation of current treatment plan related to hypertension self management and patient's adherence to plan as established by provider;   Reviewed prescribed diet Low sodium. Reviewed medications with patient and discussed importance of compliance;  Continue to monitor blood pressure at home 3 times a week. Counseled on the importance of exercise goals with target of 150 minutes per week Discussed plans with patient for ongoing care management follow up and provided patient with direct contact information for care management team; Advised patient, providing education and rationale, to monitor blood pressure daily and record, calling PCP for findings outside  established parameters;  Screening for signs and symptoms of depression related to chronic disease state;  Assessed social determinant of health barriers;   Patient Goals/Self-Care Activities: Patient will self administer medications as prescribed Patient will attend all scheduled provider appointments Patient will call pharmacy for medication refills Patient will continue to perform ADL's  independently Patient will continue to perform IADL's independently Patient will call provider office for new concerns or questions Patient will work with BSW to address care coordination needs and will continue to work with the clinical team to address health care and disease management related needs.

## 2020-12-19 ENCOUNTER — Other Ambulatory Visit: Payer: Self-pay

## 2020-12-19 NOTE — Patient Instructions (Signed)
Visit Information  [name]  - as a part of your Medicaid benefit, you are eligible for care management and care coordination services at no cost or copay. I was unable to reach you by phone today but would be happy to help you with your health related needs. Please feel free to call me @ [phone].   A member of the Managed Medicaid care management team will reach out to you again over the next 14 days.   Mickel Fuchs, BSW, [place] Managed Medicaid Team  [phone]

## 2020-12-19 NOTE — Patient Outreach (Signed)
Care Coordination  12/19/2020  Charlotte Perez Jun 28, 1960 [id number]   Medicaid Managed Care   Unsuccessful Outreach Note  12/19/2020 Name: Charlotte Perez MRN: [id number] DOB: 1960/12/22  Referred by: Wilburt Finlay, MD Reason for referral : High Risk Managed Medicaid (MM Social Work Unsuccessful Telephone Call)   An unsuccessful telephone outreach was attempted today. The patient was referred to the case management team for assistance with care management and care coordination.   Follow Up Plan: The care management team will reach out to the patient again over the next 14 days.   Mickel Fuchs, BSW, [place] Managed Medicaid Team  [phone]

## 2020-12-20 DIAGNOSIS — Z79899 Other long term (current) drug therapy: Secondary | ICD-10-CM | POA: Diagnosis not present

## 2020-12-20 DIAGNOSIS — G8929 Other chronic pain: Secondary | ICD-10-CM | POA: Diagnosis not present

## 2020-12-20 DIAGNOSIS — M545 Low back pain, unspecified: Secondary | ICD-10-CM | POA: Diagnosis not present

## 2020-12-20 DIAGNOSIS — E1140 Type 2 diabetes mellitus with diabetic neuropathy, unspecified: Secondary | ICD-10-CM | POA: Diagnosis not present

## 2020-12-23 DIAGNOSIS — Z419 Encounter for procedure for purposes other than remedying health state, unspecified: Secondary | ICD-10-CM | POA: Diagnosis not present

## 2020-12-25 ENCOUNTER — Other Ambulatory Visit: Payer: Self-pay | Admitting: Nurse Practitioner

## 2020-12-27 ENCOUNTER — Other Ambulatory Visit: Payer: Self-pay

## 2020-12-27 NOTE — Patient Outreach (Signed)
Medicaid Managed Care   Nurse Care Manager Note  12/27/2020 Name:  Charlotte Perez MRN:  [id number] DOB:  09-18-1960  Charlotte Perez is an 60 y.o. year old female who is a primary patient of Kotturi, Tyler Deis, MD.  The [facility] Managed Care Coordination team was consulted for assistance with:    HTN DMII Chronic GI Issues? IBS with constipation  [name] was given information about Medicaid Managed Care Coordination team services today. [name] Patient agreed to services and verbal consent obtained.  Engaged with patient by telephone for follow up visit in response to provider referral for case management and/or care coordination services.   Assessments/Interventions:  Review of past medical history, allergies, medications, health status, including review of consultants reports, laboratory and other test data, was performed as part of comprehensive evaluation and provision of chronic care management services.  SDOH (Social Determinants of Health) assessments and interventions performed:   Care Plan  Allergies  Allergen Reactions   Cymbalta [Duloxetine Hcl]     Per pt it started making her mouth tingle and could not taste anything   Gabapentin     Itching, fidgety   Meloxicam Other (See Comments)    Tongue tingling, and lost sense of taste.   Tramadol Other (See Comments)    Tongue tingling, and lost sense of taste.    Medications Reviewed Today     Reviewed by [name], RN (Case Manager) on 12/27/20 at 66  Med List Status: <None>   Medication Order Taking? Sig Documenting Provider Last Dose Status Informant  ACCU-CHEK FASTCLIX LANCETS MISC [id number] No EVERY DAY Sharion Balloon, FNP Taking Active   albuterol (PROVENTIL) (2.5 MG/3ML) 0.083% nebulizer solution [id number] No Take 3 mLs (2.5 mg total) by nebulization every 6 (six) hours as needed for wheezing or shortness of breath. Dettinger, Fransisca Kaufmann, MD Taking Active   ALPRAZolam Duanne Moron) 1 MG tablet  [id number] No Take 1 tablet (1 mg total) by mouth at bedtime as needed.  Patient not taking: Reported on 11/30/2020   Cloria Spring, MD Not Taking Active            Med Note [facility], Albertine Patricia Nov 30, 2020 10:11 AM) Taking 1/2 tablet  ARIPiprazole (ABILIFY) 2 MG tablet [id number] No Take 1 tablet (2 mg total) by mouth daily.  Patient not taking: Reported on 11/30/2020   Cloria Spring, MD Not Taking Active   atorvastatin (LIPITOR) 40 MG tablet [id number]  TAKE ONE (1) TABLET EACH DAY Noreene Larsson, NP  Active   cycloSPORINE (RESTASIS) 0.05 % ophthalmic emulsion [id number] No 1 drop 2 (two) times daily. [provider] Taking Active   empagliflozin (JARDIANCE) 10 MG TABS tablet [id number] No TAKE ONE (1) TABLET EACH DAY  Patient not taking: Reported on 11/30/2020   Noreene Larsson, NP Not Taking Active            Med Note [facility], Marsa Matteo A   Thu Nov 30, 2020 10:15 AM) Taking 25 mg tablet  glucose blood (ACCU-CHEK GUIDE) test strip [id number] No Use daily Dx E11.9 Noreene Larsson, NP Taking Active   hydrocortisone (ANUSOL-HC) 2.5 % rectal cream [id number] No Place 1 application rectally 2 (two) times daily.  Patient not taking: Reported on 11/30/2020   Noreene Larsson, NP Not Taking Active   latanoprost (XALATAN) 0.005 % ophthalmic solution [id number] No Place 1 drop into both eyes at bedtime. [provider] Taking Active  linaclotide (LINZESS) 145 MCG CAPS capsule [id number] No Take 1 capsule (145 mcg total) by mouth daily before breakfast. Montez Morita, Daniel, MD Taking Active   lisinopril (ZESTRIL) 5 MG tablet [id number] No TAKE ONE (1) TABLET EACH DAY  Patient not taking: Reported on 11/30/2020   Noreene Larsson, NP Not Taking Active            Med Note [facility], Wrigley Winborne A   Thu Nov 30, 2020 10:15 AM) Taking 1/2 tablet  magnesium gluconate (MAGONATE) 500 MG tablet [id number] No Take 500 mg by mouth 2 (two) times daily. [provider] Taking Active             Med Note Gerome Apley, Albertine Patricia Nov 30, 2020 10:28 AM) Taking 250 mg tablet 2 times daily  metFORMIN (GLUCOPHAGE) 1000 MG tablet [id number] No TAKE ONE TABLET TWICE A DAY WITH FOOD Noreene Larsson, NP Taking Active   omeprazole (PRILOSEC) 40 MG capsule [id number] No TAKE ONE (1) CAPSULE EACH DAY Noreene Larsson, NP Taking Active   ondansetron (ZOFRAN-ODT) 4 MG disintegrating tablet [id number] No Take 1 tablet (4 mg total) by mouth every 8 (eight) hours as needed for nausea or vomiting. Hayden Rasmussen, MD Taking Active   OXYCODONE ER PO [id number] No Take by mouth. As needed for lower back pain. [provider] Taking Active            Med Note Joaquim Lai, Cyril Loosen   Tue Jun 09, 2018  8:51 AM) Given by pain management provider.  pregabalin (LYRICA) 50 MG capsule [id number] No TAKE ONE (1) CAPSULE THREE (3) TIMES EACH DAY  Patient not taking: Reported on 11/30/2020   Noreene Larsson, NP Not Taking Active   venlafaxine XR (EFFEXOR-XR) 150 MG 24 hr capsule [id number] No Take 2 capsules (300 mg total) by mouth at bedtime. Cloria Spring, MD Taking Active   vitamin B-12 (CYANOCOBALAMIN) 1000 MCG tablet [id number] No Take 1,000 mcg by mouth daily. Take 2 tablets daily [provider] Taking Active             Patient Active Problem List   Diagnosis Date Noted   IBS (irritable bowel syndrome) 11/16/2020   Chronic prescription opiate use 11/16/2020   Hemorrhoid 05/18/2020   Abdominal pain 04/14/2020   Moderate single current episode of major depressive disorder ([place]) 06/09/2018   Constipation 09/05/2016   Chronic low back pain 06/05/2016   Diabetic peripheral neuropathy ([place]) 12/01/2015   Overweight (BMI 25.0-29.9) 08/31/2015   Migraine 12/26/2014   GAD (generalized anxiety disorder) 10/25/2013   GERD (gastroesophageal reflux disease) 10/25/2013   High blood pressure 06/27/2010   Hyperlipidemia associated with type 2 diabetes mellitus ([place]) 06/27/2010    Conditions to be  addressed/monitored per PCP order:  HTN, DMII, and Chronic GI Issues/IBS with constipation  Care Plan : RNCM Plan of Care  Updates made by [name], RN since 12/27/2020 12:00 AM     Problem: RNCM Plan of Care for Care Management and Care Coordination Needs for DM, HTN, GI Issues      Long-Range Goal: RNCM Plan of Care for Care Management and Care Coordination Needs   Start Date: 11/30/2020  Expected End Date: 03/30/2021  Priority: High  Note:   Current Barriers:  Knowledge Deficits related to plan of care for management of HTN, DMII, and GI Issues/IBS with Constipation  Care Coordination needs related to Limited social support, Social Isolation, Literacy concerns, Lacks knowledge of community  resource: available foot doctors who accept Managed Medicaid patients, and is in need of referrals to foot doctor who accepts Managed Medicaid patients  Chronic Disease Management support and education needs related to HTN, DMII, and GI Issues/IBS with Constipation Literacy barriers No Advanced Directives in place  RNCM Clinical Goal(s):  Patient will verbalize understanding of plan for management of HTN, DMII, and GI Issues/IBS with Constipation verbalize basic understanding of HTN, DMII, and GI Issues/IBS with Constipation disease process and self health management plan  take all medications exactly as prescribed and will call provider for medication related questions attend all scheduled medical appointments: GI Doctor appointment on 03/05/21 demonstrate ongoing adherence to prescribed treatment plan for HTN, DMII, and GI Issues/IBS with Constipation as evidenced by daily monitoring and recording of CBG  adherence to ADA/ carb modified diet exercise 3 days/week adherence to prescribed medication regimen contacting provider for new or worsened symptoms or questions and monitor blood pressure at home 3 times a week.  continue to work with RN Care Manager to address care management and care  coordination needs related to HTN, DMII, and GI Issues/IBS with Constipation  work with Education officer, museum to address Limited social support, Environmental consultant, Literacy concerns, and Lacks knowledge of community resource: of available foot doctors who accept Managed Medicaid patients related to the management of HTN, DMII, and GI Issues/IBS with Constipation through collaboration with Consulting civil engineer, provider, and care team.   Interventions: Inter-disciplinary care team collaboration (see longitudinal plan of care) Evaluation of current treatment plan related to self management and patient's adherence to plan as established by provider   Diabetes:  (Status: New goal.) Lab Results  Component Value Date   HGBA1C 6.7 (H) 05/11/2020    Assessed patient's understanding of A1c goal: <7% Provided education to patient about basic DM disease process Reviewed medications with patient and discussed importance of medication adherence       Counseled on importance of regular laboratory monitoring as prescribed        Discussed plans with patient for ongoing care management follow up and provided patient with direct contact information for care management team     Provided patient with education related to hypo and hyperglycemia and importance of correct treatment     Advised patient, providing education and rationale, to check cbg 3 times daily and record. 12/27/20: Patient reports her blood sugars range is 130's for FBS in AM and 200 range in evenings.      Call provider for findings outside established parameters       Referral made to social work team on initial visit for assistance with finding a foot doctor who accepts [employer].  BSW working with patient currently.     Review of patient status, including review of consultants reports, relevant laboratory and other test results, and medications completed      Screening for signs and symptoms of depression related to chronic disease state        Assessed social determinant of health barriers          GI Issues / IBS with Constipation (Status: New goal.) Evaluation of current treatment plan related to  GI Issues / IBS with constipation . 12/27/2020 Patient reports constipation issues are greatly improved since starting on Linzess.  No further constipation currently. BSW assistance with Limited social support, Social Isolation, Literacy concerns, and Lacks knowledge of community resource: finding a foot doctor who accepts Managed Medicaid patients.  self-management and patient's adherence to plan as  established by provider. Discussed plans with patient for ongoing care management follow up and provided patient with direct contact information for care management team Advised patient to continue taking Linzess as ordered; Reviewed medications with patient and discussed importance of taking medications, especially Linzess as prescribed; Screening for signs and symptoms of depression related to chronic disease state;  Assessed social determinant of health barriers;   Hypertension: (Status: New goal.) Last practice recorded BP readings:  BP Readings from Last 3 Encounters:  11/16/20 121/78  07/12/20 120/72  05/18/20 107/71  Most recent eGFR/CrCl: No results found for: EGFR  No components found for: CRCL  Evaluation of current treatment plan related to hypertension self management and patient's adherence to plan as established by provider. 10/5/2022Fraser Din reports she continues to monitor her blood pressure at home at least 3 times/week.  Blood pressures range from 99/71 to 116/76.  Denies any signs or symptoms of Hypotension.  Reviewed prescribed diet Low sodium. Reviewed medications with patient and discussed importance of compliance;  Continue to monitor blood pressure at home 3 times a week. Counseled on the importance of exercise goals with target of 150 minutes per week.  12/27/2020: Patient reports she is trying to walk more often but  feet begin to hurt.  Patient states she would walk more often if she can see foot doctor. Discussed plans with patient for ongoing care management follow up and provided patient with direct contact information for care management team; Advised patient, providing education and rationale, to monitor blood pressure daily and record, calling PCP for findings outside established parameters;  Screening for signs and symptoms of depression related to chronic disease state;  Assessed social determinant of health barriers;   Patient Goals/Self-Care Activities: Patient will self administer medications as prescribed Patient will attend all scheduled provider appointments Patient will call pharmacy for medication refills Patient will continue to perform ADL's independently Patient will continue to perform IADL's independently Patient will call provider office for new concerns or questions Patient will work with BSW to address care coordination needs and will continue to work with the clinical team to address health care and disease management related needs.         Follow Up:  Patient agrees to Care Plan and Follow-up.  Plan: The Managed Medicaid care management team will reach out to the patient again over the next 30 days.  Date/time of next scheduled RN care management/care coordination outreach:  01/25/2021 at 9:00 am  [place], [facility] Mobile: [phone]

## 2020-12-27 NOTE — Patient Instructions (Signed)
Visit Information  Ms. Goforth was given information about Medicaid Managed Care team care coordination services as a part of their [facility] Medicaid benefit. [name] verbally consented to engagement with the [facility] Managed Care team.   If you are experiencing a medical emergency, please call 911 or report to your local emergency department or urgent care.   If you have a non-emergency medical problem during routine business hours, please contact your provider's office and ask to speak with a nurse.   For questions related to your [facility] health plan, please call: [phone] or go here:[email or web address]  If you would like to schedule transportation through your [facility] plan, please call the following number at least 2 days in advance of your appointment: [phone].  Call the [facility] at [phone], at any time, 24 hours a day, 7 days a week. If you are in danger or need immediate medical attention call 911.  If you would like help to quit smoking, call 1-800-QUIT-NOW 314-143-1061) OR Espaol: 1-855-Djelo-Ya (9-937-169-6789) o para ms informacin haga clic aqu or Text READY to 200-400 to register via text  Ms. Burnett Harry - following are the goals we discussed in your visit today:   Goals Addressed             This Visit's Progress    RNCM - Glycemic Management       Timeframe:  Long-Range Goal Priority:  High Start Date:   11/30/2020                       Expected End Date: 04/23/2021          Patient Goals/Self-Care Activities: Patient will self administer medications as prescribed Patient will attend all scheduled provider appointments Patient will call pharmacy for medication refills Patient will continue to perform ADL's independently Patient will continue to perform IADL's independently Patient will call provider office for new concerns or questions Patient will work with BSW to address care coordination  needs and will continue to work with the clinical team to address health care and disease management related needs.                    RNCM - Hypertension monitored and managed       Timeframe:  Long-Range Goal Priority:  High Start Date:   11/30/2020                          Expected End Date:   04/23/2021         Patient Goals/Self-Care Activities: Patient will self administer medications as prescribed Patient will attend all scheduled provider appointments Patient will call pharmacy for medication refills Patient will continue to perform ADL's independently Patient will continue to perform IADL's independently Patient will call provider office for new concerns or questions Patient will work with BSW to address care coordination needs and will continue to work with the clinical team to address health care and disease management related needs.                   RNCM - Management of GI Issues/IBS with constipation       Timeframe:  Long-Range Goal Priority:  High Start Date: 11/30/2020                            Expected End Date:  04/23/2021   Patient Goals/Self-Care Activities:  Patient will self administer medications as prescribed Patient will attend all scheduled provider appointments Patient will call pharmacy for medication refills Patient will continue to perform ADL's independently Patient will continue to perform IADL's independently Patient will call provider office for new concerns or questions Patient will work with BSW to address care coordination needs and will continue to work with the clinical team to address health care and disease management related needs.                             Please see education materials related to today's visit provided by MyChart link.  The patient verbalized understanding of instructions provided today and declined a print copy of patient instruction materials.   The Managed Medicaid care management team will reach out to the patient  again over the next 30 days.   Salvatore Marvel RN, BSN Community Care Coordinator [place] [facility] Mobile: [phone]   Following is a copy of your plan of care:  Patient Care Plan: RNCM Plan of Care     Problem Identified: RNCM Plan of Care for Care Management and Care Coordination Needs for DM, HTN, GI Issues      Long-Range Goal: RNCM Plan of Care for Care Management and Care Coordination Needs   Start Date: 11/30/2020  Expected End Date: 03/30/2021  Priority: High  Note:   Current Barriers:  Knowledge Deficits related to plan of care for management of HTN, DMII, and GI Issues/IBS with Constipation  Care Coordination needs related to Limited social support, Social Isolation, Literacy concerns, Lacks knowledge of community resource: available foot doctors who accept Managed Medicaid patients, and is in need of referrals to foot doctor who accepts Managed Medicaid patients  Chronic Disease Management support and education needs related to HTN, DMII, and GI Issues/IBS with Constipation Literacy barriers No Advanced Directives in place  RNCM Clinical Goal(s):  Patient will verbalize understanding of plan for management of HTN, DMII, and GI Issues/IBS with Constipation verbalize basic understanding of HTN, DMII, and GI Issues/IBS with Constipation disease process and self health management plan  take all medications exactly as prescribed and will call provider for medication related questions attend all scheduled medical appointments: GI Doctor appointment on 03/05/21 demonstrate ongoing adherence to prescribed treatment plan for HTN, DMII, and GI Issues/IBS with Constipation as evidenced by daily monitoring and recording of CBG  adherence to ADA/ carb modified diet exercise 3 days/week adherence to prescribed medication regimen contacting provider for new or worsened symptoms or questions and monitor blood pressure at home 3 times a week.  continue to work with RN  Care Manager to address care management and care coordination needs related to HTN, DMII, and GI Issues/IBS with Constipation  work with Education officer, museum to address Limited social support, Environmental consultant, Literacy concerns, and Lacks knowledge of community resource: of available foot doctors who accept Managed Medicaid patients related to the management of HTN, DMII, and GI Issues/IBS with Constipation through collaboration with Consulting civil engineer, provider, and care team.   Interventions: Inter-disciplinary care team collaboration (see longitudinal plan of care) Evaluation of current treatment plan related to self management and patient's adherence to plan as established by provider   Diabetes:  (Status: New goal.) Lab Results  Component Value Date   HGBA1C 6.7 (H) 05/11/2020    Assessed patient's understanding of A1c goal: <7% Provided education to patient about basic DM disease process Reviewed medications with patient  and discussed importance of medication adherence       Counseled on importance of regular laboratory monitoring as prescribed        Discussed plans with patient for ongoing care management follow up and provided patient with direct contact information for care management team     Provided patient with education related to hypo and hyperglycemia and importance of correct treatment     Advised patient, providing education and rationale, to check cbg 3 times daily and record. 12/27/20: Patient reports her blood sugars range is 130's for FBS in AM and 200 range in evenings.      Call provider for findings outside established parameters       Referral made to social work team on initial visit for assistance with finding a foot doctor who accepts [employer].  BSW working with patient currently.     Review of patient status, including review of consultants reports, relevant laboratory and other test results, and medications completed      Screening for signs and symptoms of  depression related to chronic disease state       Assessed social determinant of health barriers          GI Issues / IBS with Constipation (Status: New goal.) Evaluation of current treatment plan related to  GI Issues / IBS with constipation . 12/27/2020 Patient reports constipation issues are greatly improved since starting on Linzess.  No further constipation currently. BSW assistance with Limited social support, Social Isolation, Literacy concerns, and Lacks knowledge of community resource: finding a foot doctor who accepts Managed Medicaid patients.  self-management and patient's adherence to plan as established by provider. Discussed plans with patient for ongoing care management follow up and provided patient with direct contact information for care management team Advised patient to continue taking Linzess as ordered; Reviewed medications with patient and discussed importance of taking medications, especially Linzess as prescribed; Screening for signs and symptoms of depression related to chronic disease state;  Assessed social determinant of health barriers;   Hypertension: (Status: New goal.) Last practice recorded BP readings:  BP Readings from Last 3 Encounters:  11/16/20 121/78  07/12/20 120/72  05/18/20 107/71  Most recent eGFR/CrCl: No results found for: EGFR  No components found for: CRCL  Evaluation of current treatment plan related to hypertension self management and patient's adherence to plan as established by provider. 10/5/2022Fraser Din reports she continues to monitor her blood pressure at home at least 3 times/week.  Blood pressures range from 99/71 to 116/76.  Denies any signs or symptoms of Hypotension.  Reviewed prescribed diet Low sodium. Reviewed medications with patient and discussed importance of compliance;  Continue to monitor blood pressure at home 3 times a week. Counseled on the importance of exercise goals with target of 150 minutes per week.  12/27/2020:  Patient reports she is trying to walk more often but feet begin to hurt.  Patient states she would walk more often if she can see foot doctor. Discussed plans with patient for ongoing care management follow up and provided patient with direct contact information for care management team; Advised patient, providing education and rationale, to monitor blood pressure daily and record, calling PCP for findings outside established parameters;  Screening for signs and symptoms of depression related to chronic disease state;  Assessed social determinant of health barriers;   Patient Goals/Self-Care Activities: Patient will self administer medications as prescribed Patient will attend all scheduled provider appointments Patient will call pharmacy for medication  refills Patient will continue to perform ADL's independently Patient will continue to perform IADL's independently Patient will call provider office for new concerns or questions Patient will work with BSW to address care coordination needs and will continue to work with the clinical team to address health care and disease management related needs.

## 2020-12-28 ENCOUNTER — Ambulatory Visit: Payer: Self-pay

## 2020-12-28 ENCOUNTER — Other Ambulatory Visit: Payer: Self-pay

## 2020-12-28 NOTE — Patient Outreach (Signed)
Medicaid Managed Care   Nurse Care Manager Note  12/28/2020 Name:  Charlotte Perez MRN:  [id number] DOB:  [birth year]  Charlotte Perez is an 60 y.o. year old female who is a primary patient of Kotturi, Tyler Deis, MD.  The [facility] Managed Care Coordination team was consulted for assistance with:    HTN DMII Chronic GI Issues/IBS with constipation  Ms. Humbarger was given information about Medicaid Managed Care Coordination team services today. [name] Patient agreed to services and verbal consent obtained.  Engaged with patient by telephone for  notification that [place] Medicaid is closing the case due to patient being out of the service area and will be followed by [place]  in response to provider referral for case management and/or care coordination services.   Assessments/Interventions:  Review of past medical history, allergies, medications, health status, including review of consultants reports, laboratory and other test data, was performed as part of comprehensive evaluation and provision of chronic care management services.  SDOH (Social Determinants of Health) assessments and interventions performed:   Care Plan  Allergies  Allergen Reactions   Cymbalta [Duloxetine Hcl]     Per pt it started making her mouth tingle and could not taste anything   Gabapentin     Itching, fidgety   Meloxicam Other (See Comments)    Tongue tingling, and lost sense of taste.   Tramadol Other (See Comments)    Tongue tingling, and lost sense of taste.    Medications Reviewed Today     Reviewed by [name], RN (Case Manager) on 12/27/20 at 69  Med List Status: <None>   Medication Order Taking? Sig Documenting Provider Last Dose Status Informant  ACCU-CHEK FASTCLIX LANCETS MISC [id number] No EVERY DAY Sharion Balloon, FNP Taking Active   albuterol (PROVENTIL) (2.5 MG/3ML) 0.083% nebulizer solution [id number] No  Take 3 mLs (2.5 mg total) by nebulization every 6 (six) hours as needed for wheezing or shortness of breath. Dettinger, Fransisca Kaufmann, MD Taking Active   ALPRAZolam Duanne Moron) 1 MG tablet [id number] No Take 1 tablet (1 mg total) by mouth at bedtime as needed.  Patient not taking: Reported on 11/30/2020   Cloria Spring, MD Not Taking Active            Med Note [facility], Albertine Patricia Nov 30, 2020 10:11 AM) Taking 1/2 tablet  ARIPiprazole (ABILIFY) 2 MG tablet [id number] No Take 1 tablet (2 mg total) by mouth daily.  Patient not taking: Reported on 11/30/2020   Cloria Spring, MD Not Taking Active   atorvastatin (LIPITOR) 40 MG tablet [id number]  TAKE ONE (1) TABLET EACH DAY Noreene Larsson, NP  Active   cycloSPORINE (RESTASIS) 0.05 % ophthalmic emulsion [id number] No 1 drop 2 (two) times daily. [provider] Taking Active   empagliflozin (JARDIANCE) 10 MG TABS tablet [id number] No TAKE ONE (1) TABLET EACH DAY  Patient not taking: Reported on 11/30/2020   Noreene Larsson, NP Not Taking Active            Med Note [facility], Dove Gresham A   Thu Nov 30, 2020 10:15 AM) Taking 25 mg tablet  glucose blood (ACCU-CHEK GUIDE) test strip [id number] No Use daily Dx E11.9 Noreene Larsson, NP Taking Active   hydrocortisone (ANUSOL-HC) 2.5 % rectal cream [id number] No Place 1 application rectally 2 (two) times daily.  Patient not taking: Reported on 11/30/2020  Noreene Larsson, NP Not Taking Active   latanoprost (XALATAN) 0.005 % ophthalmic solution [id number] No Place 1 drop into both eyes at bedtime. [provider] Taking Active   linaclotide Rolan Lipa) 145 MCG CAPS capsule [id number] No Take 1 capsule (145 mcg total) by mouth daily before breakfast. Montez Morita, Daniel, MD Taking Active   lisinopril (ZESTRIL) 5 MG tablet [id number] No TAKE ONE (1) TABLET EACH DAY  Patient not taking: Reported on 11/30/2020   Noreene Larsson, NP Not Taking Active            Med Note [facility], Stavroula Rohde A   Thu Nov 30, 2020  10:15 AM) Taking 1/2 tablet  magnesium gluconate (MAGONATE) 500 MG tablet [id number] No Take 500 mg by mouth 2 (two) times daily. [provider] Taking Active            Med Note Ger[facility] Apley, Albertine Patricia Nov 30, 2020 10:28 AM) Taking 250 mg tablet 2 times daily  metFORMIN (GLUCOPHAGE) 1000 MG tablet [id number] No TAKE ONE TABLET TWICE A DAY WITH FOOD Noreene Larsson, NP Taking Active   omeprazole (PRILOSEC) 40 MG capsule [id number] No TAKE ONE (1) CAPSULE EACH DAY Noreene Larsson, NP Taking Active   ondansetron (ZOFRAN-ODT) 4 MG disintegrating tablet [id number] No Take 1 tablet (4 mg total) by mouth every 8 (eight) hours as needed for nausea or vomiting. Hayden Rasmussen, MD Taking Active   OXYCODONE ER PO [id number] No Take by mouth. As needed for lower back pain. [provider] Taking Active            Med Note Joaquim Lai, Cyril Loosen   Tue Jun 09, 2018  8:51 AM) Given by pain management provider.  pregabalin (LYRICA) 50 MG capsule [id number] No TAKE ONE (1) CAPSULE THREE (3) TIMES EACH DAY  Patient not taking: Reported on 11/30/2020   Noreene Larsson, NP Not Taking Active   venlafaxine XR (EFFEXOR-XR) 150 MG 24 hr capsule [id number] No Take 2 capsules (300 mg total) by mouth at bedtime. Cloria Spring, MD Taking Active   vitamin B-12 (CYANOCOBALAMIN) 1000 MCG tablet [id number] No Take 1,000 mcg by mouth daily. Take 2 tablets daily [provider] Taking Active             Patient Active Problem List   Diagnosis Date Noted   IBS (irritable bowel syndrome) 11/16/2020   Chronic prescription opiate use 11/16/2020   Hemorrhoid 05/18/2020   Abdominal pain 04/14/2020   Moderate single current episode of major depressive disorder ([place]) 06/09/2018   Constipation 09/05/2016   Chronic low back pain 06/05/2016   Diabetic peripheral neuropathy ([place]) 12/01/2015   Overweight (BMI 25.0-29.9) 08/31/2015   Migraine 12/26/2014   GAD (generalized anxiety disorder) 10/25/2013   GERD  (gastroesophageal reflux disease) 10/25/2013   High blood pressure 06/27/2010   Hyperlipidemia associated with type 2 diabetes mellitus ([place]) 06/27/2010    Conditions to be addressed/monitored per PCP order:  HTN, DMII, and Chronic GI Issues/IBS with constipation  Care Plan : [facility] Plan of Care  Updates made by [name], RN since 12/28/2020 12:00 AM  Completed 12/28/2020   Problem: RNCM Plan of Care for Care Management and Care Coordination Needs for DM, HTN, GI Issues Resolved 12/28/2020     Long-Range Goal: RNCM Plan of Care for Care Management and Care Coordination Needs Completed 12/28/2020  Start Date: 11/30/2020  Expected End Date: 03/30/2021  Priority: High  Note:   Current Barriers:  Knowledge Deficits related to plan of care for management of HTN, DMII, and GI Issues/IBS with Constipation  Care Coordination needs related to Limited social support, Social Isolation, Literacy concerns, Lacks knowledge of community resource: available foot doctors who accept Managed Medicaid patients, and is in need of referrals to foot doctor who accepts Managed Medicaid patients  Chronic Disease Management support and education needs related to HTN, DMII, and GI Issues/IBS with Constipation Literacy barriers No Advanced Directives in place  RNCM Clinical Goal(s):  Patient will verbalize understanding of plan for management of HTN, DMII, and GI Issues/IBS with Constipation verbalize basic understanding of HTN, DMII, and GI Issues/IBS with Constipation disease process and self health management plan  take all medications exactly as prescribed and will call provider for medication related questions attend all scheduled medical appointments: GI Doctor appointment on 03/05/21 demonstrate ongoing adherence to prescribed treatment plan for HTN, DMII, and GI Issues/IBS with Constipation as evidenced by daily monitoring and recording of CBG  adherence to ADA/ carb modified diet exercise 3 days/week  adherence to prescribed medication regimen contacting provider for new or worsened symptoms or questions and monitor blood pressure at home 3 times a week.  continue to work with RN Care Manager to address care management and care coordination needs related to HTN, DMII, and GI Issues/IBS with Constipation  work with Education officer, museum to address Limited social support, Environmental consultant, Literacy concerns, and Lacks knowledge of community resource: of available foot doctors who accept Managed Medicaid patients related to the management of HTN, DMII, and GI Issues/IBS with Constipation through collaboration with Consulting civil engineer, provider, and care team.   Interventions: Inter-disciplinary care team collaboration (see longitudinal plan of care) Evaluation of current treatment plan related to self management and patient's adherence to plan as established by provider   Diabetes:  (Status: New goal.) Lab Results  Component Value Date   HGBA1C 6.7 (H) 05/11/2020    Assessed patient's understanding of A1c goal: <7% Provided education to patient about basic DM disease process Reviewed medications with patient and discussed importance of medication adherence       Counseled on importance of regular laboratory monitoring as prescribed        Discussed plans with patient for ongoing care management follow up and provided patient with direct contact information for care management team     Provided patient with education related to hypo and hyperglycemia and importance of correct treatment     Advised patient, providing education and rationale, to check cbg 3 times daily and record. 12/27/20: Patient reports her blood sugars range is 130's for FBS in AM and 200 range in evenings.      Call provider for findings outside established parameters       Referral made to social work team on initial visit for assistance with finding a foot doctor who accepts [employer].  BSW working with patient currently.      Review of patient status, including review of consultants reports, relevant laboratory and other test results, and medications completed      Screening for signs and symptoms of depression related to chronic disease state       Assessed social determinant of health barriers          GI Issues / IBS with Constipation (Status: New goal.) Evaluation of current treatment plan related to  GI Issues / IBS with constipation . 12/27/2020 Patient reports constipation issues are greatly improved since starting on Linzess.  No further constipation  currently. BSW assistance with Limited social support, Social Isolation, Literacy concerns, and Lacks knowledge of community resource: finding a foot doctor who accepts Managed Medicaid patients.  self-management and patient's adherence to plan as established by provider. Discussed plans with patient for ongoing care management follow up and provided patient with direct contact information for care management team Advised patient to continue taking Linzess as ordered; Reviewed medications with patient and discussed importance of taking medications, especially Linzess as prescribed; Screening for signs and symptoms of depression related to chronic disease state;  Assessed social determinant of health barriers;   Hypertension: (Status: New goal.) Last practice recorded BP readings:  BP Readings from Last 3 Encounters:  11/16/20 121/78  07/12/20 120/72  05/18/20 107/71  Most recent eGFR/CrCl: No results found for: EGFR  No components found for: CRCL  Evaluation of current treatment plan related to hypertension self management and patient's adherence to plan as established by provider. 10/5/2022Fraser Din reports she continues to monitor her blood pressure at home at least 3 times/week.  Blood pressures range from 99/71 to 116/76.  Denies any signs or symptoms of Hypotension.  Reviewed prescribed diet Low sodium. Reviewed medications with patient and discussed  importance of compliance;  Continue to monitor blood pressure at home 3 times a week. Counseled on the importance of exercise goals with target of 150 minutes per week.  12/27/2020: Patient reports she is trying to walk more often but feet begin to hurt.  Patient states she would walk more often if she can see foot doctor. Discussed plans with patient for ongoing care management follow up and provided patient with direct contact information for care management team; Advised patient, providing education and rationale, to monitor blood pressure daily and record, calling PCP for findings outside established parameters;  Screening for signs and symptoms of depression related to chronic disease state;  Assessed social determinant of health barriers;   Patient Goals/Self-Care Activities: Patient will self administer medications as prescribed Patient will attend all scheduled provider appointments Patient will call pharmacy for medication refills Patient will continue to perform ADL's independently Patient will continue to perform IADL's independently Patient will call provider office for new concerns or questions Patient will work with BSW to address care coordination needs and will continue to work with the clinical team to address health care and disease management related needs.         Follow Up:  Patient verbalized understanding that [place] Medicaid is closing the case due to patient being out of the service area.  Patient understands she will be followed by [facility].  Plan: The Managed Medicaid care management team is available to follow up with the patient after provider conversation with the patient regarding recommendation for care management engagement and subsequent re-referral to the care management team.    Salvatore Marvel RN, BSN [facility] Coordinator [facility] Mobile: [phone]

## 2020-12-28 NOTE — Patient Instructions (Signed)
12/28/2020 Name: Charlotte Perez MRN: [id number] DOB: 1960/03/27  Referred by: Wilburt Finlay, MD Reason for referral : High Risk Managed Medicaid (RNCM Visit for High Risk Managed Medicaid Care Management and Care Coordination)   A telephone contact was made today to notify the patient that [place] Medicaid is closing the case due to patient is out of the service area.  This RN Case Manager informed patient that she will be followed by [facility] at this time since she resides in their service area.  The patient is in agreement with this plan.  Salvatore Marvel RN, BSN Community Care Coordinator [facility] [facility] Mobile: [phone]

## 2021-01-19 DIAGNOSIS — E119 Type 2 diabetes mellitus without complications: Secondary | ICD-10-CM | POA: Diagnosis not present

## 2021-01-19 DIAGNOSIS — M545 Low back pain, unspecified: Secondary | ICD-10-CM | POA: Diagnosis not present

## 2021-01-19 DIAGNOSIS — G8929 Other chronic pain: Secondary | ICD-10-CM | POA: Diagnosis not present

## 2021-01-19 DIAGNOSIS — Z79899 Other long term (current) drug therapy: Secondary | ICD-10-CM | POA: Diagnosis not present

## 2021-01-23 DIAGNOSIS — Z419 Encounter for procedure for purposes other than remedying health state, unspecified: Secondary | ICD-10-CM | POA: Diagnosis not present

## 2021-01-25 ENCOUNTER — Ambulatory Visit: Payer: Medicaid Other

## 2021-02-02 ENCOUNTER — Encounter (HOSPITAL_COMMUNITY): Payer: Self-pay | Admitting: Psychiatry

## 2021-02-02 ENCOUNTER — Other Ambulatory Visit: Payer: Self-pay

## 2021-02-02 ENCOUNTER — Telehealth (INDEPENDENT_AMBULATORY_CARE_PROVIDER_SITE_OTHER): Payer: Medicaid Other | Admitting: Psychiatry

## 2021-02-02 DIAGNOSIS — F321 Major depressive disorder, single episode, moderate: Secondary | ICD-10-CM

## 2021-02-02 MED ORDER — ALPRAZOLAM 1 MG PO TABS: 1.0000 mg | ORAL_TABLET | Freq: Every evening | ORAL | 2 refills | Status: DC | PRN

## 2021-02-02 MED ORDER — VENLAFAXINE HCL ER 150 MG PO CP24: 300.0000 mg | ORAL_CAPSULE | Freq: Every day | ORAL | 2 refills | Status: DC

## 2021-02-02 MED ORDER — ARIPIPRAZOLE 2 MG PO TABS: 2.0000 mg | ORAL_TABLET | Freq: Every day | ORAL | 2 refills | Status: DC

## 2021-02-02 NOTE — Progress Notes (Signed)
Virtual Visit via Telephone Note  I connected with Charlotte Perez on 02/02/21 at 11:00 AM EST by telephone and verified that I am speaking with the correct person using two identifiers.  Location: Patient: home Provider:home office   I discussed the limitations, risks, security and privacy concerns of performing an evaluation and management service by telephone and the availability of in person appointments. I also discussed with the patient that there may be a patient responsible charge related to this service. The patient expressed understanding and agreed to proceed.    I discussed the assessment and treatment plan with the patient. The patient was provided an opportunity to ask questions and all were answered. The patient agreed with the plan and demonstrated an understanding of the instructions.   The patient was advised to call back or seek an in-person evaluation if the symptoms worsen or if the condition fails to improve as anticipated.  I provided 15 minutes of non-face-to-face time during this encounter.   [name], MD  [facility] MD/PA/NP OP Progress Note  02/02/2021 12:17 PM Charlotte Perez  MRN:  [id number]  Chief Complaint:  Chief Complaint   Depression; Anxiety; Follow-up    HPI: This patient is a 60 year old married white female who lives with her husband in [place].  They have 3 grown children.  The husband's brother has recently been living with them as well.  She is on disability.  The patient returns for follow-up after 3 months.  She states she has been more depressed recently.  A lot of this has to do with her marriage.  Her husband has early dementia and she states he is difficult and irritable.  He often ignores her or else puts her down is tells her she is fat.  They do not have any sort of physical relationship.  She also feels estranged from her grown children.  She has been lonely and crying more often.  We had tried to add Abilify to her regimen and it  never got approved and we will try again.  That she states at times she wishes she would die but would never do anything active to hurt her self.  We definitely do need to add a mood stabilizer to help with her mood and I strongly urged her to to get back into therapy. Visit Diagnosis:    ICD-10-CM   1. 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   Arthritis    Asthma    Bipolar disorder ([place])    Chronic back pain    Chronic neck pain    Constipation 09/05/2016   COPD (chronic obstructive pulmonary disease) ([place])    DDD (degenerative disc disease), cervical    DDD (degenerative disc disease), lumbar    Depression    Diabetes mellitus ([place]) 06/27/2010   Diabetes mellitus, type II (HCC)    Gallbladder sludge    GERD (gastroesophageal reflux disease)    Hyperlipidemia    Hypertension    Iron deficiency anemia 09/05/2016   Lumbar radiculopathy    Mole (skin)    Neuropathy of foot    Renal cyst    Shingles    Skin lesions, generalized    1.2 CM FLAT FAWN COLOR AT THE T10 AREA JUST RIGHT OF SPINE  Past Surgical History:  Procedure Laterality Date   ABDOMINAL HYSTERECTOMY     total hysterectomy   BACK SURGERY     CESAREAN SECTION     x 2   NECK SURGERY      Family Psychiatric History: see below  Family History:  Family History  Problem Relation Age of Onset   ADD / ADHD Other    COPD Father    Alcohol abuse Father    Depression Daughter    Alcohol abuse Mother    COPD Mother        on oxygen   Colon cancer Paternal Grandfather    Heart disease Sister    Heart disease Brother    Arthritis Sister        knee replacement   Diabetes Maternal Grandmother    Diabetes Sister     Social History:  Social History   Socioeconomic History    Marital status: Married    Spouse name: Ronnie   Number of children: 3   Years of education: Not on file   Highest education level: Not on file  Occupational History   Occupation: disablility  Tobacco Use   Smoking status: Passive Smoke Exposure - Never Smoker   Smokeless tobacco: Never  Vaping Use   Vaping Use: Never used  Substance and Sexual Activity   Alcohol use: No   Drug use: No   Sexual activity: Not Currently    Birth control/protection: Surgical    Comment: hyst  Other Topics Concern   Not on file  Social History Narrative   Not on file   Social Determinants of Health   Financial Resource Strain: Low Risk    Difficulty of Paying Living Expenses: Not very hard  Food Insecurity: Food Insecurity Present   Worried About Running Out of Food in the Last Year: Sometimes true   Ran Out of Food in the Last Year: Sometimes true  Transportation Needs: No Transportation Needs   Lack of Transportation (Medical): No   Lack of Transportation (Non-Medical): No  Physical Activity: Insufficiently Active   Days of Exercise per Week: 1 day   Minutes of Exercise per Session: 60 min  Stress: No Stress Concern Present   Feeling of Stress : Only a little  Social Connections: Moderately Isolated   Frequency of Communication with Friends and Family: Three times a week   Frequency of Social Gatherings with Friends and Family: Never   Attends Religious Services: Never   Marine scientist or Organizations: No   Attends Archivist Meetings: Never   Marital Status: Married    Allergies:  Allergies  Allergen Reactions   Cymbalta [Duloxetine Hcl]     Per pt it started making her mouth tingle and could not taste anything   Gabapentin     Itching, fidgety   Meloxicam Other (See Comments)    Tongue tingling, and lost sense of taste.   Tramadol Other (See Comments)    Tongue tingling, and lost sense of taste.    Metabolic Disorder Labs: Lab Results  Component Value  Date   HGBA1C 6.7 (H) 05/11/2020   No results found for: PROLACTIN Lab Results  Component Value Date   CHOL 154 05/11/2020   TRIG 59 05/11/2020   HDL 73 05/11/2020   CHOLHDL 2.1 06/09/2018   LDLCALC 69 05/11/2020   LDLCALC 135 (H) 02/09/2020   Lab Results  Component Value Date   TSH 1.44 11/16/2020   TSH 1.760 07/26/2016  Therapeutic Level Labs: No results found for: LITHIUM No results found for: VALPROATE No components found for:  CBMZ  Current Medications: Current Outpatient Medications  Medication Sig Dispense Refill   ACCU-CHEK FASTCLIX LANCETS MISC EVERY DAY 102 each 2   albuterol (PROVENTIL) (2.5 MG/3ML) 0.083% nebulizer solution Take 3 mLs (2.5 mg total) by nebulization every 6 (six) hours as needed for wheezing or shortness of breath. 75 mL 3   ALPRAZolam (XANAX) 1 MG tablet Take 1 tablet (1 mg total) by mouth at bedtime as needed. 30 tablet 2   ARIPiprazole (ABILIFY) 2 MG tablet Take 1 tablet (2 mg total) by mouth daily. 30 tablet 2   atorvastatin (LIPITOR) 40 MG tablet TAKE ONE (1) TABLET EACH DAY 90 tablet 3   cycloSPORINE (RESTASIS) 0.05 % ophthalmic emulsion 1 drop 2 (two) times daily.     empagliflozin (JARDIANCE) 10 MG TABS tablet TAKE ONE (1) TABLET EACH DAY (Patient not taking: Reported on 11/30/2020) 90 tablet 0   glucose blood (ACCU-CHEK GUIDE) test strip Use daily Dx E11.9 100 each 3   hydrocortisone (ANUSOL-HC) 2.5 % rectal cream Place 1 application rectally 2 (two) times daily. (Patient not taking: Reported on 11/30/2020) 30 g 0   latanoprost (XALATAN) 0.005 % ophthalmic solution Place 1 drop into both eyes at bedtime.     linaclotide (LINZESS) 145 MCG CAPS capsule Take 1 capsule (145 mcg total) by mouth daily before breakfast. 90 capsule 3   lisinopril (ZESTRIL) 5 MG tablet TAKE ONE (1) TABLET EACH DAY (Patient not taking: Reported on 11/30/2020) 90 tablet 0   magnesium gluconate (MAGONATE) 500 MG tablet Take 500 mg by mouth 2 (two) times daily.      metFORMIN (GLUCOPHAGE) 1000 MG tablet TAKE ONE TABLET TWICE A DAY WITH FOOD 180 tablet 0   omeprazole (PRILOSEC) 40 MG capsule TAKE ONE (1) CAPSULE EACH DAY 90 capsule 1   ondansetron (ZOFRAN-ODT) 4 MG disintegrating tablet Take 1 tablet (4 mg total) by mouth every 8 (eight) hours as needed for nausea or vomiting. 20 tablet 0   OXYCODONE ER PO Take by mouth. As needed for lower back pain.     pregabalin (LYRICA) 50 MG capsule TAKE ONE (1) CAPSULE THREE (3) TIMES EACH DAY (Patient not taking: Reported on 11/30/2020) 90 capsule 1   venlafaxine XR (EFFEXOR-XR) 150 MG 24 hr capsule Take 2 capsules (300 mg total) by mouth at bedtime. 60 capsule 2   vitamin B-12 (CYANOCOBALAMIN) 1000 MCG tablet Take 1,000 mcg by mouth daily. Take 2 tablets daily     No current facility-administered medications for this visit.     Musculoskeletal: Strength & Muscle Tone: na Gait & Station: na Patient leans: N/A  Psychiatric Specialty Exam: Review of Systems  Psychiatric/Behavioral:  Positive for dysphoric mood.   All other systems reviewed and are negative.  There were no vitals taken for this visit.There is no height or weight on file to calculate BMI.  General Appearance: NA  Eye Contact:  NA  Speech:  Clear and Coherent  Volume:  Normal  Mood:  Depressed  Affect:  NA  Thought Process:  Goal Directed  Orientation:  Full (Time, Place, and Person)  Thought Content: Rumination   Suicidal Thoughts:  Yes.  without intent/plan  Homicidal Thoughts:  No  Memory:  Immediate;   Good Recent;   Good Remote;   Good  Judgement:  Good  Insight:  Good  Psychomotor Activity:  Decreased  Concentration:  Concentration: Fair and Attention  Span: Fair  Recall:  Roel Cluck of Knowledge: Fair  Language: Good  Akathisia:  No  Handed:  Right  AIMS (if indicated): not done  Assets:  Communication Skills Desire for Improvement Physical Health Resilience Talents/Skills  ADL's:  Intact  Cognition: WNL  Sleep:  Fair    Screenings: GAD-7    Flowsheet Row Office Visit from 07/12/2020 in [facility]  Total GAD-7 Score 3      PHQ2-9    Flowsheet Row Video Visit from 02/02/2021 in [facility] Video Visit from 11/14/2020 in [place] Office Visit from 07/12/2020 in [facility] Video Visit from 06/12/2020 in [place] Office Visit from 05/18/2020 in [facility]  PHQ-2 Total Score 4 5 6 2 2   PHQ-9 Total Score 20 16 19 7 7       Flowsheet Row Video Visit from 02/02/2021 in [place] Video Visit from 11/14/2020 in [place] Video Visit from 06/12/2020 in [place] Low Risk No Risk No Risk        Assessment and Plan: This patient is a 60 year old female with a history of depression and anxiety.  Recently her depression has worsened as she feels more lonely and isolated in her marriage.  We will again try to add Abilify 2 mg for augmentation.  She will continue Effexor Exar 300 mg daily for depression as well and Xanax 1 mg daily at bedtime for anxiety and sleep.  She will return to see me in 2 months and I will also insist that she start therapy.   [name], MD 02/02/2021, 12:17 PM

## 2021-02-14 ENCOUNTER — Telehealth (HOSPITAL_COMMUNITY): Payer: Medicaid Other | Admitting: Psychiatry

## 2021-02-19 ENCOUNTER — Other Ambulatory Visit: Payer: Self-pay | Admitting: Nurse Practitioner

## 2021-02-19 DIAGNOSIS — Z79899 Other long term (current) drug therapy: Secondary | ICD-10-CM | POA: Diagnosis not present

## 2021-02-19 DIAGNOSIS — G8929 Other chronic pain: Secondary | ICD-10-CM | POA: Diagnosis not present

## 2021-02-19 DIAGNOSIS — E119 Type 2 diabetes mellitus without complications: Secondary | ICD-10-CM | POA: Diagnosis not present

## 2021-02-19 DIAGNOSIS — M545 Low back pain, unspecified: Secondary | ICD-10-CM | POA: Diagnosis not present

## 2021-02-19 DIAGNOSIS — E2740 Unspecified adrenocortical insufficiency: Secondary | ICD-10-CM | POA: Diagnosis not present

## 2021-02-21 ENCOUNTER — Telehealth (HOSPITAL_COMMUNITY): Payer: Self-pay | Admitting: *Deleted

## 2021-02-21 NOTE — Telephone Encounter (Signed)
Patient called and stated she started the Abilify and she feels like its too strong for her. Per pt she is extremely sleepy the next day and groggy. Per pt she was wondering if provider could lower the dose or just allow her to take 1/2 tab

## 2021-02-22 DIAGNOSIS — Z419 Encounter for procedure for purposes other than remedying health state, unspecified: Secondary | ICD-10-CM | POA: Diagnosis not present

## 2021-02-22 NOTE — Telephone Encounter (Signed)
She can try the half

## 2021-02-22 NOTE — Telephone Encounter (Signed)
Spoke with patient and she agreed and verbalized understanding

## 2021-03-05 ENCOUNTER — Ambulatory Visit (INDEPENDENT_AMBULATORY_CARE_PROVIDER_SITE_OTHER): Payer: Medicaid Other | Admitting: Gastroenterology

## 2021-03-05 ENCOUNTER — Other Ambulatory Visit: Payer: Self-pay

## 2021-03-05 ENCOUNTER — Encounter (INDEPENDENT_AMBULATORY_CARE_PROVIDER_SITE_OTHER): Payer: Self-pay | Admitting: Gastroenterology

## 2021-03-05 ENCOUNTER — Encounter (INDEPENDENT_AMBULATORY_CARE_PROVIDER_SITE_OTHER): Payer: Self-pay

## 2021-03-05 ENCOUNTER — Other Ambulatory Visit (INDEPENDENT_AMBULATORY_CARE_PROVIDER_SITE_OTHER): Payer: Self-pay

## 2021-03-05 VITALS — BP 115/62 | HR 84 | Temp 98.0°F | Ht 59.0 in | Wt 122.6 lb

## 2021-03-05 DIAGNOSIS — R1013 Epigastric pain: Secondary | ICD-10-CM | POA: Insufficient documentation

## 2021-03-05 DIAGNOSIS — K581 Irritable bowel syndrome with constipation: Secondary | ICD-10-CM | POA: Diagnosis not present

## 2021-03-05 DIAGNOSIS — Z79891 Long term (current) use of opiate analgesic: Secondary | ICD-10-CM | POA: Diagnosis not present

## 2021-03-05 DIAGNOSIS — G8929 Other chronic pain: Secondary | ICD-10-CM | POA: Diagnosis not present

## 2021-03-05 MED ORDER — OMEPRAZOLE 40 MG PO CPDR: 40.0000 mg | DELAYED_RELEASE_CAPSULE | Freq: Every day | ORAL | 1 refills | Status: DC

## 2021-03-05 NOTE — H&P (View-Only) (Signed)
Maylon Peppers, M.D. Gastroenterology & Hepatology [facility] [address] [place], [place] [address]  Primary Care Physician: Wilburt Finlay, MD [place] [id number]  I will communicate my assessment and recommendations to the referring MD via EMR.  Problems: IBS-C Chronic opiate use epigastric pain   History of Present Illness: Charlotte Perez is a 60 y.o. female female with PMH asthma, ADD, bipolar disorder, COPD, diabetes, depression, IBS-C, chronic opiate use who presents for follow up of constipation and evaluation of epigastric pain.  The patient was last seen on 11/16/2021. At that time, the patient was given a bowel prep and she was started on Linzess 145 mcg every day.  Her TSH was within normal limits.  She brought today in the report of her most recent colonoscopy with findings described below..  Patient reports that she is having a bowel movement every other day as long as she takes the Linzess 145 mcg compliantly.  Only has to strain when she has skipped a few days of the Linzess. Has noticed occasionally she has a few white pellets in her toilet paper when wiping but denies feeling any lumps or rectal pain. The patient denies having any nausea, vomiting, fever, chills, hematochezia, melena, hematemesis, abdominal distention, abdominal pain, diarrhea, jaundice, pruritus. Has decreased her weight un purpose by doing dietary changes.  She has presented chronic pain in her epigastric area for the last 3 months. This has been off and on for years. She is taking omeprazole 40 mg qday. States she was put on it for management of GERD but she denies heartburn or dysphagia.   Patient is taking oxycodone 1-2 times a day for management of back pain.  Last EGD: never Last Colonoscopy: 07/21/2015, performed at [facility] by Dr. Britta Mccreedy.  Found to have small external and internal hemorrhoids, mild diverticulosis in the  sigmoid colon.  Recommended to repeat in 10 years.  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   Arthritis    Asthma    Bipolar disorder (HCC)    Chronic back pain    Chronic neck pain    Constipation 09/05/2016   COPD (chronic obstructive pulmonary disease) ([place])    DDD (degenerative disc disease), cervical    DDD (degenerative disc disease), lumbar    Depression    Diabetes mellitus ([place]) 06/27/2010   Diabetes mellitus, type II (HCC)    Gallbladder sludge    GERD (gastroesophageal reflux disease)    Hyperlipidemia    Hypertension    Iron deficiency anemia 09/05/2016   Lumbar radiculopathy    Mole (skin)    Neuropathy of foot    Renal cyst    Shingles    Skin lesions, generalized    1.2 CM FLAT FAWN COLOR AT THE T10 AREA JUST RIGHT OF SPINE     Past Surgical History: Past Surgical History:  Procedure Laterality Date   ABDOMINAL HYSTERECTOMY     total hysterectomy   BACK SURGERY     CESAREAN SECTION     x 2   NECK SURGERY      Family History: Family History  Problem Relation Age of Onset   ADD / ADHD Other    COPD Father    Alcohol abuse Father    Depression Daughter    Alcohol abuse Mother    COPD Mother        on oxygen  Colon cancer Paternal Grandfather    Heart disease Sister    Heart disease Brother    Arthritis Sister        knee replacement   Diabetes Maternal Grandmother    Diabetes Sister     Social History: Social History   Tobacco Use  Smoking Status Passive Smoke Exposure - Never Smoker  Smokeless Tobacco Never   Social History   Substance and Sexual Activity  Alcohol Use No   Social History   Substance and Sexual Activity  Drug Use No    Allergies: Allergies  Allergen Reactions   Cymbalta [Duloxetine Hcl]     Per pt it started making her mouth tingle and could not taste anything   Gabapentin     Itching, fidgety   Meloxicam Other (See  Comments)    Tongue tingling, and lost sense of taste.   Tramadol Other (See Comments)    Tongue tingling, and lost sense of taste.    Medications: Current Outpatient Medications  Medication Sig Dispense Refill   ACCU-CHEK FASTCLIX LANCETS MISC EVERY DAY 102 each 2   albuterol (PROVENTIL) (2.5 MG/3ML) 0.083% nebulizer solution Take 3 mLs (2.5 mg total) by nebulization every 6 (six) hours as needed for wheezing or shortness of breath. 75 mL 3   ALPRAZolam (XANAX) 1 MG tablet Take 1 tablet (1 mg total) by mouth at bedtime as needed. 30 tablet 2   ARIPiprazole (ABILIFY) 2 MG tablet Take 1 tablet (2 mg total) by mouth daily. 30 tablet 2   atorvastatin (LIPITOR) 40 MG tablet TAKE ONE (1) TABLET EACH DAY 90 tablet 3   cycloSPORINE (RESTASIS) 0.05 % ophthalmic emulsion 1 drop 2 (two) times daily.     empagliflozin (JARDIANCE) 10 MG TABS tablet TAKE ONE (1) TABLET EACH DAY 90 tablet 0   glucose blood (ACCU-CHEK GUIDE) test strip Use daily Dx E11.9 100 each 3   latanoprost (XALATAN) 0.005 % ophthalmic solution Place 1 drop into both eyes at bedtime.     linaclotide (LINZESS) 145 MCG CAPS capsule Take 1 capsule (145 mcg total) by mouth daily before breakfast. 90 capsule 3   lisinopril (ZESTRIL) 5 MG tablet TAKE ONE (1) TABLET EACH DAY 90 tablet 0   metFORMIN (GLUCOPHAGE) 1000 MG tablet TAKE ONE TABLET TWICE A DAY WITH FOOD 180 tablet 0   omeprazole (PRILOSEC) 40 MG capsule TAKE ONE (1) CAPSULE EACH DAY 90 capsule 1   OXYCODONE ER PO Take by mouth. As needed for lower back pain.     pregabalin (LYRICA) 50 MG capsule TAKE ONE (1) CAPSULE THREE (3) TIMES EACH DAY 90 capsule 1   venlafaxine XR (EFFEXOR-XR) 150 MG 24 hr capsule Take 2 capsules (300 mg total) by mouth at bedtime. 60 capsule 2   vitamin B-12 (CYANOCOBALAMIN) 1000 MCG tablet Take 1,000 mcg by mouth daily. Take 2 tablets daily     No current facility-administered medications for this visit.    Review of Systems: GENERAL: negative for  malaise, night sweats HEENT: No changes in hearing or vision, no nose bleeds or other nasal problems. NECK: Negative for lumps, goiter, pain and significant neck swelling RESPIRATORY: Negative for cough, wheezing CARDIOVASCULAR: Negative for chest pain, leg swelling, palpitations, orthopnea GI: SEE HPI MUSCULOSKELETAL: Negative for joint pain or swelling, back pain, and muscle pain. SKIN: Negative for lesions, rash PSYCH: Negative for sleep disturbance, mood disorder and recent psychosocial stressors. HEMATOLOGY Negative for prolonged bleeding, bruising easily, and swollen nodes. ENDOCRINE: Negative for cold or heat  intolerance, polyuria, polydipsia and goiter. NEURO: negative for tremor, gait imbalance, syncope and seizures. The remainder of the review of systems is noncontributory.   Physical Exam: BP 115/62 (BP Location: Left Arm, Patient Position: Sitting, Cuff Size: Small)    Pulse 84    Temp 98 F (36.7 C) (Oral)    Ht 4\' 11"  (1.499 m)    Wt 122 lb 9.6 oz (55.6 kg)    BMI 24.76 kg/m  GENERAL: The patient is AO x3, in no acute distress. HEENT: Head is normocephalic and atraumatic. EOMI are intact. Mouth is well hydrated and without lesions. NECK: Supple. No masses LUNGS: Clear to auscultation. No presence of rhonchi/wheezing/rales. Adequate chest expansion HEART: RRR, normal s1 and s2. ABDOMEN: tender to palpation in the epigastric area, no guarding, no peritoneal signs, and nondistended. BS +. No masses. EXTREMITIES: Without any cyanosis, clubbing, rash, lesions or edema. NEUROLOGIC: AOx3, no focal motor deficit. SKIN: no jaundice, no rashes  Imaging/Labs: as above  I personally reviewed and interpreted the available labs, imaging and endoscopic files.  Impression and Plan: Charlotte Perez is a 60 y.o. female female with PMH asthma, ADD, bipolar disorder, COPD, diabetes, depression, IBS-C, chronic opiate use who presents for follow up of constipation and evaluation of  epigastric pain.  The patient has presented major improvement in her constipation while taking Linzess 145 mcg.  It is likely her IBS-C has been affected by her chronic opiate use but fortunately she has responded to the current dosage.  I advised her to take it on a daily basis but if she does not tolerate this frequently she can take it every other day.  Regarding her chronic epigastric pain, which could be related to bowel hypersensitivity in the setting of chronic opiate use but she has not presented any red flag signs.  However, given her age we will explore this further when esophagogastroduodenospy.  She had an abdominal ultrasound on December 2021 that did not show any abnormalities, As well as a CT of the chest, abdomen and pelvis with IV contrast on June 2021 that did not show any acute changes to explain this pain.  - chedule EGD  -Continue Linzess 145 mcg every day . If having too many bowel movements, can decrease to every other day  All questions were answered.      Harvel Quale, MD Gastroenterology and Hepatology [facility]

## 2021-03-05 NOTE — Patient Instructions (Signed)
Charlotte Perez  03/05/2021     @PREFPERIOPPHARMACY @   Your procedure is scheduled on  03/07/2021.   Report to [facility] at  [facility].   Call this number if you have problems the morning of surgery:  [phone]   Remember:  Follow the diet and prep instructions given to you by the office.    Take these medicines the morning of surgery with A SIP OF WATER                           abilify, hydrocodone, prilosec.      Do not wear jewelry, make-up or nail polish.  Do not wear lotions, powders, or perfumes, or deodorant.  Do not shave 48 hours prior to surgery.  Men may shave face and neck.  Do not bring valuables to the hospital.  [facility] is not responsible for any belongings or valuables.  Contacts, dentures or bridgework may not be worn into surgery.  Leave your suitcase in the car.  After surgery it may be brought to your room.  For patients admitted to the hospital, discharge time will be determined by your treatment team.  Patients discharged the day of surgery will not be allowed to drive home and must have someone with them for 24 hours.    Special instructions:   DO NOT smoke tobacco or vape for 24 hours before your procedure.  Please read over the following fact sheets that you were given. Anesthesia Post-op Instructions and Care and Recovery After Surgery      Upper Endoscopy, Adult, Care After This sheet gives you information about how to care for yourself after your procedure. Your health care provider may also give you more specific instructions. If you have problems or questions, contact your health care provider. What can I expect after the procedure? After the procedure, it is common to have: A sore throat. Mild stomach pain or discomfort. Bloating. Nausea. Follow these instructions at home:  Follow instructions from your health care provider about what to eat or drink after your procedure. Return to your normal activities as told  by your health care provider. Ask your health care provider what activities are safe for you. Take over-the-counter and prescription medicines only as told by your health care provider. If you were given a sedative during the procedure, it can affect you for several hours. Do not drive or operate machinery until your health care provider says that it is safe. Keep all follow-up visits as told by your health care provider. This is important. Contact a health care provider if you have: A sore throat that lasts longer than one day. Trouble swallowing. Get help right away if: You vomit blood or your vomit looks like coffee grounds. You have: A fever. Bloody, black, or tarry stools. A severe sore throat or you cannot swallow. Difficulty breathing. Severe pain in your chest or abdomen. Summary After the procedure, it is common to have a sore throat, mild stomach discomfort, bloating, and nausea. If you were given a sedative during the procedure, it can affect you for several hours. Do not drive or operate machinery until your health care provider says that it is safe. Follow instructions from your health care provider about what to eat or drink after your procedure. Return to your normal activities as told by your health care provider. This information is not intended to replace advice given to you  by your health care provider. Make sure you discuss any questions you have with your health care provider. Document Revised: 01/15/2019 Document Reviewed: 08/11/2017 Elsevier Patient Education  2022 [place] After This sheet gives you information about how to care for yourself after your procedure. Your health care provider may also give you more specific instructions. If you have problems or questions, contact your health care provider. What can I expect after the procedure? After the procedure, it is common to have: Tiredness. Forgetfulness about what happened  after the procedure. Impaired judgment for important decisions. Nausea or vomiting. Some difficulty with balance. Follow these instructions at home: For the time period you were told by your health care provider:   Rest as needed. Do not participate in activities where you could fall or become injured. Do not drive or use machinery. Do not drink alcohol. Do not take sleeping pills or medicines that cause drowsiness. Do not make important decisions or sign legal documents. Do not take care of children on your own. Eating and drinking Follow the diet that is recommended by your health care provider. Drink enough fluid to keep your urine pale yellow. If you vomit: Drink water, juice, or soup when you can drink without vomiting. Make sure you have little or no nausea before eating solid foods. General instructions Have a responsible adult stay with you for the time you are told. It is important to have someone help care for you until you are awake and alert. Take over-the-counter and prescription medicines only as told by your health care provider. If you have sleep apnea, surgery and certain medicines can increase your risk for breathing problems. Follow instructions from your health care provider about wearing your sleep device: Anytime you are sleeping, including during daytime naps. While taking prescription pain medicines, sleeping medicines, or medicines that make you drowsy. Avoid smoking. Keep all follow-up visits as told by your health care provider. This is important. Contact a health care provider if: You keep feeling nauseous or you keep vomiting. You feel light-headed. You are still sleepy or having trouble with balance after 24 hours. You develop a rash. You have a fever. You have redness or swelling around the IV site. Get help right away if: You have trouble breathing. You have new-onset confusion at home. Summary For several hours after your procedure, you may feel  tired. You may also be forgetful and have poor judgment. Have a responsible adult stay with you for the time you are told. It is important to have someone help care for you until you are awake and alert. Rest as told. Do not drive or operate machinery. Do not drink alcohol or take sleeping pills. Get help right away if you have trouble breathing, or if you suddenly become confused. This information is not intended to replace advice given to you by your health care provider. Make sure you discuss any questions you have with your health care provider. Document Revised: 11/25/2019 Document Reviewed: 02/11/2019 Elsevier Patient Education  2022 [employer].

## 2021-03-05 NOTE — Progress Notes (Signed)
Charlotte Perez, M.D. Gastroenterology & Hepatology [facility] [address] [place], [place] [address]  Primary Care Physician: Charlotte Finlay, MD Charlotte Perez [phone]  I will communicate my assessment and recommendations to the referring MD via EMR.  Problems: IBS-C Chronic opiate use epigastric pain   History of Present Illness: Charlotte Perez is a 60 y.o. female female with PMH asthma, ADD, bipolar disorder, COPD, diabetes, depression, IBS-C, chronic opiate use who presents for follow up of constipation and evaluation of epigastric pain.  The patient was last seen on 11/16/2021. At that time, the patient was given a bowel prep and she was started on Linzess 145 mcg every day.  Her TSH was within normal limits.  She brought today in the report of her most recent colonoscopy with findings described below..  Patient reports that she is having a bowel movement every other day as long as she takes the Linzess 145 mcg compliantly.  Only has to strain when she has skipped a few days of the Linzess. Has noticed occasionally she has a few white pellets in her toilet paper when wiping but denies feeling any lumps or rectal pain. The patient denies having any nausea, vomiting, fever, chills, hematochezia, melena, hematemesis, abdominal distention, abdominal pain, diarrhea, jaundice, pruritus. Has decreased her weight un purpose by doing dietary changes.  She has presented chronic pain in her epigastric area for the last 3 months. This has been off and on for years. She is taking omeprazole 40 mg qday. States she was put on it for management of GERD but she denies heartburn or dysphagia.   Patient is taking oxycodone 1-2 times a day for management of back pain.  Last EGD: never Last Colonoscopy: 07/21/2015, performed at [facility] by Dr. Britta Perez.  Found to have small external and internal hemorrhoids, mild diverticulosis in the  sigmoid colon.  Recommended to repeat in 10 years.  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   Arthritis    Asthma    Bipolar disorder (HCC)    Chronic back pain    Chronic neck pain    Constipation 09/05/2016   COPD (chronic obstructive pulmonary disease) ([place])    DDD (degenerative disc disease), cervical    DDD (degenerative disc disease), lumbar    Depression    Diabetes mellitus ([place]) 06/27/2010   Diabetes mellitus, type II (HCC)    Gallbladder sludge    GERD (gastroesophageal reflux disease)    Hyperlipidemia    Hypertension    Iron deficiency anemia 09/05/2016   Lumbar radiculopathy    Mole (skin)    Neuropathy of foot    Renal cyst    Shingles    Skin lesions, generalized    1.2 CM FLAT FAWN COLOR AT THE T10 AREA JUST RIGHT OF SPINE     Past Surgical History: Past Surgical History:  Procedure Laterality Date   ABDOMINAL HYSTERECTOMY     total hysterectomy   BACK SURGERY     CESAREAN SECTION     x 2   NECK SURGERY      Family History: Family History  Problem Relation Age of Onset   ADD / ADHD Other    COPD Father    Alcohol abuse Father    Depression Daughter    Alcohol abuse Mother    COPD Mother        on oxygen  Colon cancer Paternal Grandfather    Heart disease Sister    Heart disease Brother    Arthritis Sister        knee replacement   Diabetes Maternal Grandmother    Diabetes Sister     Social History: Social History   Tobacco Use  Smoking Status Passive Smoke Exposure - Never Smoker  Smokeless Tobacco Never   Social History   Substance and Sexual Activity  Alcohol Use No   Social History   Substance and Sexual Activity  Drug Use No    Allergies: Allergies  Allergen Reactions   Cymbalta [Duloxetine Hcl]     Per pt it started making her mouth tingle and could not taste anything   Gabapentin     Itching, fidgety   Meloxicam Other (See  Comments)    Tongue tingling, and lost sense of taste.   Tramadol Other (See Comments)    Tongue tingling, and lost sense of taste.    Medications: Current Outpatient Medications  Medication Sig Dispense Refill   ACCU-CHEK FASTCLIX LANCETS MISC EVERY DAY 102 each 2   albuterol (PROVENTIL) (2.5 MG/3ML) 0.083% nebulizer solution Take 3 mLs (2.5 mg total) by nebulization every 6 (six) hours as needed for wheezing or shortness of breath. 75 mL 3   ALPRAZolam (XANAX) 1 MG tablet Take 1 tablet (1 mg total) by mouth at bedtime as needed. 30 tablet 2   ARIPiprazole (ABILIFY) 2 MG tablet Take 1 tablet (2 mg total) by mouth daily. 30 tablet 2   atorvastatin (LIPITOR) 40 MG tablet TAKE ONE (1) TABLET EACH DAY 90 tablet 3   cycloSPORINE (RESTASIS) 0.05 % ophthalmic emulsion 1 drop 2 (two) times daily.     empagliflozin (JARDIANCE) 10 MG TABS tablet TAKE ONE (1) TABLET EACH DAY 90 tablet 0   glucose blood (ACCU-CHEK GUIDE) test strip Use daily Dx E11.9 100 each 3   latanoprost (XALATAN) 0.005 % ophthalmic solution Place 1 drop into both eyes at bedtime.     linaclotide (LINZESS) 145 MCG CAPS capsule Take 1 capsule (145 mcg total) by mouth daily before breakfast. 90 capsule 3   lisinopril (ZESTRIL) 5 MG tablet TAKE ONE (1) TABLET EACH DAY 90 tablet 0   metFORMIN (GLUCOPHAGE) 1000 MG tablet TAKE ONE TABLET TWICE A DAY WITH FOOD 180 tablet 0   omeprazole (PRILOSEC) 40 MG capsule TAKE ONE (1) CAPSULE EACH DAY 90 capsule 1   OXYCODONE ER PO Take by mouth. As needed for lower back pain.     pregabalin (LYRICA) 50 MG capsule TAKE ONE (1) CAPSULE THREE (3) TIMES EACH DAY 90 capsule 1   venlafaxine XR (EFFEXOR-XR) 150 MG 24 hr capsule Take 2 capsules (300 mg total) by mouth at bedtime. 60 capsule 2   vitamin B-12 (CYANOCOBALAMIN) 1000 MCG tablet Take 1,000 mcg by mouth daily. Take 2 tablets daily     No current facility-administered medications for this visit.    Review of Systems: GENERAL: negative for  malaise, night sweats HEENT: No changes in hearing or vision, no nose bleeds or other nasal problems. NECK: Negative for lumps, goiter, pain and significant neck swelling RESPIRATORY: Negative for cough, wheezing CARDIOVASCULAR: Negative for chest pain, leg swelling, palpitations, orthopnea GI: SEE HPI MUSCULOSKELETAL: Negative for joint pain or swelling, back pain, and muscle pain. SKIN: Negative for lesions, rash PSYCH: Negative for sleep disturbance, mood disorder and recent psychosocial stressors. HEMATOLOGY Negative for prolonged bleeding, bruising easily, and swollen nodes. ENDOCRINE: Negative for cold or heat  intolerance, polyuria, polydipsia and goiter. NEURO: negative for tremor, gait imbalance, syncope and seizures. The remainder of the review of systems is noncontributory.   Physical Exam: BP 115/62 (BP Location: Left Arm, Patient Position: Sitting, Cuff Size: Small)   Pulse 84   Temp 98 F (36.7 C) (Oral)   Ht 4\' 11"  (1.499 m)   Wt 122 lb 9.6 oz (55.6 kg)   BMI 24.76 kg/m  GENERAL: The patient is AO x3, in no acute distress. HEENT: Head is normocephalic and atraumatic. EOMI are intact. Mouth is well hydrated and without lesions. NECK: Supple. No masses LUNGS: Clear to auscultation. No presence of rhonchi/wheezing/rales. Adequate chest expansion HEART: RRR, normal s1 and s2. ABDOMEN: tender to palpation in the epigastric area, no guarding, no peritoneal signs, and nondistended. BS +. No masses. EXTREMITIES: Without any cyanosis, clubbing, rash, lesions or edema. NEUROLOGIC: AOx3, no focal motor deficit. SKIN: no jaundice, no rashes  Imaging/Labs: as above  I personally reviewed and interpreted the available labs, imaging and endoscopic files.  Impression and Plan: Charlotte Perez is a 60 y.o. female female with PMH asthma, ADD, bipolar disorder, COPD, diabetes, depression, IBS-C, chronic opiate use who presents for follow up of constipation and evaluation of  epigastric pain.  The patient has presented major improvement in her constipation while taking Linzess 145 mcg.  It is likely her IBS-C has been affected by her chronic opiate use but fortunately she has responded to the current dosage.  I advised her to take it on a daily basis but if she does not tolerate this frequently she can take it every other day.  Regarding her chronic epigastric pain, which could be related to bowel hypersensitivity in the setting of chronic opiate use but she has not presented any red flag signs.  However, given her age we will explore this further when esophagogastroduodenospy.  She had an abdominal ultrasound on December 2021 that did not show any abnormalities, As well as a CT of the chest, abdomen and pelvis with IV contrast on June 2021 that did not show any acute changes to explain this pain.  - chedule EGD  -Continue Linzess 145 mcg every day . If having too many bowel movements, can decrease to every other day  All questions were answered.      Harvel Quale, MD Gastroenterology and Hepatology [facility]

## 2021-03-05 NOTE — Patient Instructions (Addendum)
Schedule EGD  Continue Linzess 145 mcg every day . If having too many bowel movements, can decrease to every other day

## 2021-03-06 ENCOUNTER — Encounter (HOSPITAL_COMMUNITY)
Admission: RE | Admit: 2021-03-06 | Discharge: 2021-03-06 | Disposition: A | Discharge status: Home / Self Care | Payer: Medicaid Other | Source: Ambulatory Visit | Attending: Gastroenterology | Admitting: Gastroenterology

## 2021-03-06 ENCOUNTER — Other Ambulatory Visit (INDEPENDENT_AMBULATORY_CARE_PROVIDER_SITE_OTHER): Payer: Self-pay

## 2021-03-06 ENCOUNTER — Encounter (HOSPITAL_COMMUNITY): Payer: Self-pay

## 2021-03-06 DIAGNOSIS — Z01818 Encounter for other preprocedural examination: Secondary | ICD-10-CM | POA: Insufficient documentation

## 2021-03-06 DIAGNOSIS — E1140 Type 2 diabetes mellitus with diabetic neuropathy, unspecified: Secondary | ICD-10-CM | POA: Diagnosis not present

## 2021-03-06 DIAGNOSIS — E1142 Type 2 diabetes mellitus with diabetic polyneuropathy: Secondary | ICD-10-CM

## 2021-03-06 LAB — BASIC METABOLIC PANEL
Anion gap: 9 (ref 5–15)
BUN: 12 mg/dL (ref 8–23)
CO2: 25 mmol/L (ref 22–32)
Calcium: 9.8 mg/dL (ref 8.9–10.3)
Chloride: 103 mmol/L (ref 98–111)
Creatinine, Ser: 0.66 mg/dL (ref 0.44–1.00)
GFR, Estimated: >60 mL/min (ref >60)
Glucose, Bld: 112 mg/dL — ABNORMAL HIGH (ref 70–99)
Potassium: 4.8 mmol/L (ref 3.5–5.1)
Sodium: 137 mmol/L (ref 135–145)

## 2021-03-07 ENCOUNTER — Other Ambulatory Visit: Payer: Self-pay

## 2021-03-07 ENCOUNTER — Encounter (HOSPITAL_COMMUNITY): Payer: Self-pay | Admitting: Gastroenterology

## 2021-03-07 ENCOUNTER — Ambulatory Visit (HOSPITAL_COMMUNITY): Payer: Medicaid Other | Admitting: Anesthesiology

## 2021-03-07 ENCOUNTER — Ambulatory Visit (HOSPITAL_COMMUNITY)
Admission: RE | Admit: 2021-03-07 | Discharge: 2021-03-07 | Disposition: A | Discharge status: Home / Self Care | Payer: Medicaid Other | Attending: Gastroenterology | Admitting: Gastroenterology

## 2021-03-07 ENCOUNTER — Encounter (HOSPITAL_COMMUNITY)
Admission: RE | Disposition: A | Discharge status: Home / Self Care | Payer: Self-pay | Source: Home / Self Care | Attending: Gastroenterology

## 2021-03-07 DIAGNOSIS — F909 Attention-deficit hyperactivity disorder, unspecified type: Secondary | ICD-10-CM | POA: Diagnosis not present

## 2021-03-07 DIAGNOSIS — Z7722 Contact with and (suspected) exposure to environmental tobacco smoke (acute) (chronic): Secondary | ICD-10-CM | POA: Diagnosis not present

## 2021-03-07 DIAGNOSIS — Z79891 Long term (current) use of opiate analgesic: Secondary | ICD-10-CM | POA: Insufficient documentation

## 2021-03-07 DIAGNOSIS — E1122 Type 2 diabetes mellitus with diabetic chronic kidney disease: Secondary | ICD-10-CM | POA: Diagnosis not present

## 2021-03-07 DIAGNOSIS — D649 Anemia, unspecified: Secondary | ICD-10-CM | POA: Insufficient documentation

## 2021-03-07 DIAGNOSIS — I129 Hypertensive chronic kidney disease with stage 1 through stage 4 chronic kidney disease, or unspecified chronic kidney disease: Secondary | ICD-10-CM | POA: Diagnosis not present

## 2021-03-07 DIAGNOSIS — N189 Chronic kidney disease, unspecified: Secondary | ICD-10-CM | POA: Insufficient documentation

## 2021-03-07 DIAGNOSIS — K219 Gastro-esophageal reflux disease without esophagitis: Secondary | ICD-10-CM | POA: Insufficient documentation

## 2021-03-07 DIAGNOSIS — Z79899 Other long term (current) drug therapy: Secondary | ICD-10-CM | POA: Insufficient documentation

## 2021-03-07 DIAGNOSIS — R1013 Epigastric pain: Secondary | ICD-10-CM | POA: Diagnosis not present

## 2021-03-07 DIAGNOSIS — E1140 Type 2 diabetes mellitus with diabetic neuropathy, unspecified: Secondary | ICD-10-CM | POA: Diagnosis not present

## 2021-03-07 DIAGNOSIS — Z7984 Long term (current) use of oral hypoglycemic drugs: Secondary | ICD-10-CM | POA: Diagnosis not present

## 2021-03-07 DIAGNOSIS — K31A0 Gastric intestinal metaplasia, unspecified: Secondary | ICD-10-CM | POA: Diagnosis not present

## 2021-03-07 DIAGNOSIS — K581 Irritable bowel syndrome with constipation: Secondary | ICD-10-CM | POA: Diagnosis not present

## 2021-03-07 DIAGNOSIS — M5416 Radiculopathy, lumbar region: Secondary | ICD-10-CM | POA: Insufficient documentation

## 2021-03-07 DIAGNOSIS — R519 Headache, unspecified: Secondary | ICD-10-CM | POA: Insufficient documentation

## 2021-03-07 DIAGNOSIS — K2950 Unspecified chronic gastritis without bleeding: Secondary | ICD-10-CM | POA: Insufficient documentation

## 2021-03-07 DIAGNOSIS — F319 Bipolar disorder, unspecified: Secondary | ICD-10-CM | POA: Diagnosis not present

## 2021-03-07 DIAGNOSIS — K31A19 Gastric intestinal metaplasia without dysplasia, unspecified site: Secondary | ICD-10-CM | POA: Diagnosis not present

## 2021-03-07 DIAGNOSIS — G8929 Other chronic pain: Secondary | ICD-10-CM

## 2021-03-07 DIAGNOSIS — J449 Chronic obstructive pulmonary disease, unspecified: Secondary | ICD-10-CM | POA: Diagnosis not present

## 2021-03-07 DIAGNOSIS — F419 Anxiety disorder, unspecified: Secondary | ICD-10-CM | POA: Insufficient documentation

## 2021-03-07 HISTORY — PX: BIOPSY: SHX5522

## 2021-03-07 HISTORY — PX: ESOPHAGOGASTRODUODENOSCOPY (EGD) WITH PROPOFOL: SHX5813

## 2021-03-07 LAB — GLUCOSE, CAPILLARY: Glucose-Capillary: 99 mg/dL (ref 70–99)

## 2021-03-07 SURGERY — ESOPHAGOGASTRODUODENOSCOPY (EGD) WITH PROPOFOL
Anesthesia: General

## 2021-03-07 MED ORDER — PROPOFOL 10 MG/ML IV BOLUS
INTRAVENOUS | Status: DC | PRN
  Administered 2021-03-07: 20 mg via INTRAVENOUS
  Administered 2021-03-07: 50 mg via INTRAVENOUS

## 2021-03-07 MED ORDER — DICYCLOMINE HCL 10 MG PO CAPS: 10.0000 mg | ORAL_CAPSULE | Freq: Two times a day (BID) | ORAL | 2 refills | Status: DC | PRN

## 2021-03-07 MED ORDER — LIDOCAINE HCL (CARDIAC) PF 100 MG/5ML IV SOSY
PREFILLED_SYRINGE | INTRAVENOUS | Status: DC | PRN
  Administered 2021-03-07: 40 mg via INTRAVENOUS

## 2021-03-07 MED ORDER — LACTATED RINGERS IV SOLN: INTRAVENOUS | Status: DC

## 2021-03-07 NOTE — Anesthesia Postprocedure Evaluation (Addendum)
Anesthesia Post Note  Patient: CARLOTA PHILLEY  Procedure(s) Performed: ESOPHAGOGASTRODUODENOSCOPY (EGD) WITH PROPOFOL BIOPSY  Patient location during evaluation: Phase II Anesthesia Type: General Level of consciousness: awake and alert and oriented Pain management: pain level controlled Vital Signs Assessment: post-procedure vital signs reviewed and stable Respiratory status: spontaneous breathing, nonlabored ventilation and respiratory function stable Cardiovascular status: blood pressure returned to baseline and stable Postop Assessment: no apparent nausea or vomiting Anesthetic complications: no   No notable events documented.   Last Vitals:  Vitals:   03/07/21 0707 03/07/21 0913  BP: 109/74 97/64  Pulse: 74 71  Resp: 16 18  Temp: 36.7 C (!) 36.4 C  SpO2: 99% 96%    Last Pain:  Vitals:   03/07/21 0913  TempSrc: Oral  PainSc: 8                  Laquia Rosano C Derrious Bologna

## 2021-03-07 NOTE — Addendum Note (Signed)
Addendum  created 03/07/21 1106 by Denese Killings, MD   Clinical Note Signed

## 2021-03-07 NOTE — Transfer of Care (Signed)
Immediate Anesthesia Transfer of Care Note  Patient: Charlotte Perez  Procedure(s) Performed: ESOPHAGOGASTRODUODENOSCOPY (EGD) WITH PROPOFOL BIOPSY  Patient Location: Short Stay  Anesthesia Type:General  Level of Consciousness: awake  Airway & Oxygen Therapy: Patient Spontanous Breathing  Post-op Assessment: Report given to RN and Post -op Vital signs reviewed and stable  Post vital signs: Reviewed and stable  Last Vitals:  Vitals Value Taken Time  BP    Temp    Pulse    Resp    SpO2      Last Pain:  Vitals:   03/07/21 0856  TempSrc:   PainSc: 8          Complications: No notable events documented.

## 2021-03-07 NOTE — Interval H&P Note (Signed)
History and Physical Interval Note:  03/07/2021 8:03 AM  Charlotte Perez  has presented today for surgery, with the diagnosis of Epigastric Pain.  The various methods of treatment have been discussed with the patient and family. After consideration of risks, benefits and other options for treatment, the patient has consented to  Procedure(s) with comments: ESOPHAGOGASTRODUODENOSCOPY (EGD) WITH PROPOFOL (N/A) - 9:15 as a surgical intervention.  The patient's history has been reviewed, patient examined, no change in status, stable for surgery.  I have reviewed the patient's chart and labs.  Questions were answered to the patient's satisfaction.     Maylon Peppers [place]

## 2021-03-07 NOTE — Op Note (Signed)
[facility] Patient Name: Charlotte Perez Procedure Date: 03/07/2021 8:50 AM MRN: [id number] Date of Birth: September 17, 1960 Attending MD: Maylon Peppers ,  CSN: [id number] Age: 60 Admit Type: Outpatient Procedure:                Upper GI endoscopy Indications:              Epigastric abdominal pain Providers:                Maylon Peppers, Lurline Del, RN, Kristine L. Risa Grill, Technician Referring MD:              Medicines:                Monitored Anesthesia Care Complications:            No immediate complications. Estimated Blood Loss:     Estimated blood loss: none. Procedure:                Pre-Anesthesia Assessment:                           - Prior to the procedure, a History and Physical                            was performed, and patient medications, allergies                            and sensitivities were reviewed. The patient's                            tolerance of previous anesthesia was reviewed.                           - The risks and benefits of the procedure and the                            sedation options and risks were discussed with the                            patient. All questions were answered and informed                            consent was obtained.                           After obtaining informed consent, the endoscope was                            passed under direct vision. Throughout the                            procedure, the patient's blood pressure, pulse, and                            oxygen saturations were monitored continuously. The  [id number]-H190 ([id number]) scope was introduced through the                            mouth, and advanced to the second part of duodenum.                            The upper GI endoscopy was accomplished without                            difficulty. The patient tolerated the procedure                            well. Scope In: 9:02:20  AM Scope Out: 9:07:29 AM Total Procedure Duration: 0 hours 5 minutes 9 seconds  Findings:      The examined esophagus was normal.      The Z-line was regular and was found 40 cm from the incisors.      The entire examined stomach was normal. Imaging was performed using       white light and narrow band imaging to visualize the mucosa. Biopsies       were taken with a cold forceps for Helicobacter pylori testing.      The examined duodenum was normal. Biopsies were taken with a cold       forceps for histology. Impression:               - Normal esophagus.                           - Z-line regular, 40 cm from the incisors.                           - Normal stomach. Biopsied.                           - Normal examined duodenum. Biopsied. Moderate Sedation:      Per Anesthesia Care Recommendation:           - Discharge patient to home (ambulatory).                           - Resume previous diet.                           - Await pathology results.                           - Consider CT abdomen and pelvis depending in                            pathology reports.                           - Take Bentyl as needed for abdominal pain Procedure Code(s):        --- Professional ---                           [phone], Esophagogastroduodenoscopy, flexible,  transoral; with biopsy, single or multiple Diagnosis Code(s):        --- Professional ---                           R10.13, Epigastric pain CPT copyright 2019 American Medical Association. All rights reserved. The codes documented in this report are preliminary and upon coder review may  be revised to meet current compliance requirements. Maylon Peppers, MD Maylon Peppers,  03/07/2021 9:12:05 AM This report has been signed electronically. Number of Addenda: 0

## 2021-03-07 NOTE — Anesthesia Preprocedure Evaluation (Signed)
Anesthesia Evaluation  Patient identified by MRN, date of birth, ID band Patient awake    Reviewed: Allergy & Precautions, H&P , NPO status , Patient's Chart, lab work & pertinent test results  Airway Mallampati: II  TM Distance: >3 FB Neck ROM: Full    Dental  (+) Dental Advisory Given, Missing   Pulmonary asthma , COPD,  COPD inhaler,    Pulmonary exam normal breath sounds clear to auscultation       Cardiovascular hypertension, Pt. on medications Normal cardiovascular exam Rhythm:Regular Rate:Normal     Neuro/Psych  Headaches, PSYCHIATRIC DISORDERS Anxiety Depression Bipolar Disorder  Neuromuscular disease (lumbar radiculopathy, neuropathy)    GI/Hepatic Neg liver ROS, GERD  Medicated and Controlled,  Endo/Other  negative endocrine ROSdiabetes, Well Controlled, Type 2, Oral Hypoglycemic Agents  Renal/GU Renal hypertensionRenal disease  negative genitourinary   Musculoskeletal  (+) Arthritis  (lumbar radiculopathy), Osteoarthritis,    Abdominal   Peds negative pediatric ROS (+) ADHD Hematology negative hematology ROS (+) anemia ,   Anesthesia Other Findings   Reproductive/Obstetrics negative OB ROS                            Anesthesia Physical Anesthesia Plan  ASA: 3  Anesthesia Plan: General   Post-op Pain Management:    Induction: Intravenous  PONV Risk Score and Plan: TIVA  Airway Management Planned: Nasal Cannula and Natural Airway  Additional Equipment:   Intra-op Plan:   Post-operative Plan:   Informed Consent: I have reviewed the patients History and Physical, chart, labs and discussed the procedure including the risks, benefits and alternatives for the proposed anesthesia with the patient or authorized representative who has indicated his/her understanding and acceptance.     Dental advisory given  Plan Discussed with: CRNA and Surgeon  Anesthesia Plan  Comments:       Anesthesia Quick Evaluation

## 2021-03-07 NOTE — Discharge Instructions (Signed)
Take Bentyl as needed for abdominal pain

## 2021-03-08 LAB — SURGICAL PATHOLOGY

## 2021-03-12 ENCOUNTER — Encounter (HOSPITAL_COMMUNITY): Payer: Self-pay | Admitting: Gastroenterology

## 2021-03-15 ENCOUNTER — Encounter (INDEPENDENT_AMBULATORY_CARE_PROVIDER_SITE_OTHER): Payer: Self-pay

## 2021-03-15 ENCOUNTER — Other Ambulatory Visit: Payer: Self-pay | Admitting: Nurse Practitioner

## 2021-03-21 DIAGNOSIS — E2740 Unspecified adrenocortical insufficiency: Secondary | ICD-10-CM | POA: Diagnosis not present

## 2021-03-21 DIAGNOSIS — E119 Type 2 diabetes mellitus without complications: Secondary | ICD-10-CM | POA: Diagnosis not present

## 2021-03-21 DIAGNOSIS — Z79899 Other long term (current) drug therapy: Secondary | ICD-10-CM | POA: Diagnosis not present

## 2021-03-21 DIAGNOSIS — M545 Low back pain, unspecified: Secondary | ICD-10-CM | POA: Diagnosis not present

## 2021-03-21 DIAGNOSIS — G8929 Other chronic pain: Secondary | ICD-10-CM | POA: Diagnosis not present

## 2021-03-25 DIAGNOSIS — Z419 Encounter for procedure for purposes other than remedying health state, unspecified: Secondary | ICD-10-CM | POA: Diagnosis not present

## 2021-04-20 DIAGNOSIS — M545 Low back pain, unspecified: Secondary | ICD-10-CM | POA: Diagnosis not present

## 2021-04-20 DIAGNOSIS — R5383 Other fatigue: Secondary | ICD-10-CM | POA: Diagnosis not present

## 2021-04-20 DIAGNOSIS — E559 Vitamin D deficiency, unspecified: Secondary | ICD-10-CM | POA: Diagnosis not present

## 2021-04-20 DIAGNOSIS — M129 Arthropathy, unspecified: Secondary | ICD-10-CM | POA: Diagnosis not present

## 2021-04-20 DIAGNOSIS — E119 Type 2 diabetes mellitus without complications: Secondary | ICD-10-CM | POA: Diagnosis not present

## 2021-04-20 DIAGNOSIS — Z79899 Other long term (current) drug therapy: Secondary | ICD-10-CM | POA: Diagnosis not present

## 2021-04-20 DIAGNOSIS — G8929 Other chronic pain: Secondary | ICD-10-CM | POA: Diagnosis not present

## 2021-04-22 ENCOUNTER — Other Ambulatory Visit (INDEPENDENT_AMBULATORY_CARE_PROVIDER_SITE_OTHER): Payer: Self-pay | Admitting: Gastroenterology

## 2021-04-23 DIAGNOSIS — H40013 Open angle with borderline findings, low risk, bilateral: Secondary | ICD-10-CM | POA: Diagnosis not present

## 2021-04-23 DIAGNOSIS — H2513 Age-related nuclear cataract, bilateral: Secondary | ICD-10-CM | POA: Diagnosis not present

## 2021-04-23 DIAGNOSIS — H16223 Keratoconjunctivitis sicca, not specified as Sjogren's, bilateral: Secondary | ICD-10-CM | POA: Diagnosis not present

## 2021-04-25 DIAGNOSIS — Z419 Encounter for procedure for purposes other than remedying health state, unspecified: Secondary | ICD-10-CM | POA: Diagnosis not present

## 2021-04-30 ENCOUNTER — Telehealth (INDEPENDENT_AMBULATORY_CARE_PROVIDER_SITE_OTHER): Payer: Medicaid Other | Admitting: Psychiatry

## 2021-04-30 ENCOUNTER — Other Ambulatory Visit: Payer: Self-pay

## 2021-04-30 ENCOUNTER — Encounter (HOSPITAL_COMMUNITY): Payer: Self-pay | Admitting: Psychiatry

## 2021-04-30 DIAGNOSIS — F321 Major depressive disorder, single episode, moderate: Secondary | ICD-10-CM | POA: Diagnosis not present

## 2021-04-30 MED ORDER — VENLAFAXINE HCL ER 150 MG PO CP24: 300.0000 mg | ORAL_CAPSULE | Freq: Every day | ORAL | 2 refills | Status: DC

## 2021-04-30 MED ORDER — ARIPIPRAZOLE 2 MG PO TABS
2.0000 mg | ORAL_TABLET | Freq: Every day | ORAL | 2 refills | Status: DC
Start: 2021-04-30 — End: 2021-08-09

## 2021-04-30 MED ORDER — ALPRAZOLAM 1 MG PO TABS: 1.0000 mg | ORAL_TABLET | Freq: Every evening | ORAL | 2 refills | Status: DC | PRN

## 2021-04-30 NOTE — Progress Notes (Signed)
Virtual Visit via Telephone Note  I connected with Charlotte Perez on 04/30/21 at 11:00 AM EST by telephone and verified that I am speaking with the correct person using two identifiers.  Location: Patient: home Provider: office   I discussed the limitations, risks, security and privacy concerns of performing an evaluation and management service by telephone and the availability of in person appointments. I also discussed with the patient that there may be a patient responsible charge related to this service. The patient expressed understanding and agreed to proceed.      I discussed the assessment and treatment plan with the patient. The patient was provided an opportunity to ask questions and all were answered. The patient agreed with the plan and demonstrated an understanding of the instructions.   The patient was advised to call back or seek an in-person evaluation if the symptoms worsen or if the condition fails to improve as anticipated.  I provided 12 minutes of non-face-to-face time during this encounter.   [name], MD  [facility] MD/PA/NP OP Progress Note  04/30/2021 11:30 AM Charlotte Perez  MRN:  [id number]  Chief Complaint:  Chief Complaint   Depression; Follow-up    HPI: This patient is a 61 year old married white female who lives with her husband in [place].  They have 3 grown children.  The husband's brother has recently been living with them as well.  She is on disability.  The patient returns for follow-up after 3 months.  For the most part she has been doing better.  The addition of Abilify seems to have helped her mood.  Her husband has early dementia and is in chronic pain from knee injury but she seems to be putting up with it a lot better.  She states that she is sleeping well her energy is good and she has minimal anxiety.  She denies suicidal ideation. Visit Diagnosis:    ICD-10-CM   1. 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   Arthritis    Asthma    Bipolar disorder (HCC)    Chronic back pain    Chronic neck pain    Constipation 09/05/2016   COPD (chronic obstructive pulmonary disease) ([place])    DDD (degenerative disc disease), cervical    DDD (degenerative disc disease), lumbar    Depression    Diabetes mellitus ([place]) 06/27/2010   Diabetes mellitus, type II ([place])    Gallbladder sludge    GERD (gastroesophageal reflux disease)    Hyperlipidemia    Hypertension    Iron deficiency anemia 09/05/2016   Lumbar radiculopathy    Mole (skin)    Neuropathy of foot    Renal cyst    Shingles    Skin lesions, generalized    1.2 CM FLAT FAWN COLOR AT THE T10 AREA JUST RIGHT OF SPINE     Past Surgical History:  Procedure Laterality Date   ABDOMINAL HYSTERECTOMY     total hysterectomy   BACK SURGERY     BIOPSY  03/07/2021   Procedure: BIOPSY;  Surgeon: Harvel Quale, MD;  [facility];  Service: Gastroenterology;;   CESAREAN SECTION     x 2   ESOPHAGOGASTRODUODENOSCOPY (EGD) WITH PROPOFOL N/A 03/07/2021   Procedure: ESOPHAGOGASTRODUODENOSCOPY (EGD) WITH PROPOFOL;  Surgeon: Montez Morita,  Quillian Quince, MD;  [facility];  Service: Gastroenterology;  Laterality: N/A;  9:15   NECK SURGERY      Family Psychiatric History: see below  Family History:  Family History  Problem Relation Age of Onset   ADD / ADHD Other    COPD Father    Alcohol abuse Father    Depression Daughter    Alcohol abuse Mother    COPD Mother        on oxygen   Colon cancer Paternal Grandfather    Heart disease Sister    Heart disease Brother    Arthritis Sister        knee replacement   Diabetes Maternal Grandmother    Diabetes Sister     Social History:  Social History   Socioeconomic  History   Marital status: Married    Spouse name: Ronnie   Number of children: 3   Years of education: Not on file   Highest education level: Not on file  Occupational History   Occupation: disablility  Tobacco Use   Smoking status: Passive Smoke Exposure - Never Smoker   Smokeless tobacco: Never  Vaping Use   Vaping Use: Never used  Substance and Sexual Activity   Alcohol use: No   Drug use: No   Sexual activity: Not Currently    Birth control/protection: Surgical    Comment: hyst  Other Topics Concern   Not on file  Social History Narrative   Not on file   Social Determinants of Health   Financial Resource Strain: Low Risk    Difficulty of Paying Living Expenses: Not very hard  Food Insecurity: Food Insecurity Present   Worried About Running Out of Food in the Last Year: Sometimes true   Ran Out of Food in the Last Year: Sometimes true  Transportation Needs: No Transportation Needs   Lack of Transportation (Medical): No   Lack of Transportation (Non-Medical): No  Physical Activity: Insufficiently Active   Days of Exercise per Week: 1 day   Minutes of Exercise per Session: 60 min  Stress: No Stress Concern Present   Feeling of Stress : Only a little  Social Connections: Moderately Isolated   Frequency of Communication with Friends and Family: Three times a week   Frequency of Social Gatherings with Friends and Family: Never   Attends Religious Services: Never   Marine scientist or Organizations: No   Attends Archivist Meetings: Never   Marital Status: Married    Allergies:  Allergies  Allergen Reactions   Cymbalta [Duloxetine Hcl]     Per pt it started making her mouth tingle and could not taste anything   Gabapentin     Itching, fidgety   Meloxicam Other (See Comments)    Tongue tingling, and lost sense of taste.   Tramadol Other (See Comments)    Tongue tingling, and lost sense of taste.    Metabolic Disorder Labs: Lab Results   Component Value Date   HGBA1C 6.7 (H) 05/11/2020   No results found for: PROLACTIN Lab Results  Component Value Date   CHOL 154 05/11/2020   TRIG 59 05/11/2020   HDL 73 05/11/2020   CHOLHDL 2.1 06/09/2018   LDLCALC 69 05/11/2020   LDLCALC 135 (H) 02/09/2020   Lab Results  Component Value Date   TSH 1.44 11/16/2020   TSH 1.760 07/26/2016    Therapeutic Level Labs: No results found for: LITHIUM No results found for: VALPROATE No components found for:  CBMZ  Current Medications: Current Outpatient Medications  Medication Sig Dispense Refill   ACCU-CHEK FASTCLIX LANCETS MISC EVERY DAY 102 each 2   albuterol (PROVENTIL) (2.5 MG/3ML) 0.083% nebulizer solution Take 3 mLs (2.5 mg total) by nebulization every 6 (six) hours as needed for wheezing or shortness of breath. 75 mL 3   ALPRAZolam (XANAX) 1 MG tablet Take 1 tablet (1 mg total) by mouth at bedtime as needed. 30 tablet 2   ARIPiprazole (ABILIFY) 2 MG tablet Take 1 tablet (2 mg total) by mouth daily. 30 tablet 2   atorvastatin (LIPITOR) 40 MG tablet TAKE ONE (1) TABLET EACH DAY 90 tablet 3   cycloSPORINE (RESTASIS) 0.05 % ophthalmic emulsion Place 1 drop into both eyes 2 (two) times daily.     dicyclomine (BENTYL) 10 MG capsule TAKE ONE CAPSULE EVERY 12 HOURS AS NEEDED 60 capsule 2   diphenhydrAMINE (BENADRYL) 25 MG tablet Take 25 mg by mouth daily as needed.     empagliflozin (JARDIANCE) 25 MG TABS tablet Take 25 mg by mouth daily.     glucose blood (ACCU-CHEK GUIDE) test strip Use daily Dx E11.9 100 each 3   HYDROcodone-acetaminophen (NORCO) 10-325 MG tablet Take 1 tablet by mouth 3 (three) times daily as needed for pain.     latanoprost (XALATAN) 0.005 % ophthalmic solution Place 1 drop into both eyes at bedtime.     linaclotide (LINZESS) 145 MCG CAPS capsule Take 1 capsule (145 mcg total) by mouth daily before breakfast. 90 capsule 3   lisinopril (ZESTRIL) 5 MG tablet TAKE ONE (1) TABLET EACH DAY (Patient taking  differently: Take 2.5 mg by mouth daily. TAKE ONE (1) TABLET EACH DAY) 90 tablet 0   Magnesium 250 MG TABS Take 500 mg by mouth daily.     metFORMIN (GLUCOPHAGE) 1000 MG tablet TAKE ONE TABLET TWICE A DAY WITH FOOD 180 tablet 0   omeprazole (PRILOSEC) 40 MG capsule TAKE ONE (1) CAPSULE EACH DAY 90 capsule 1   pregabalin (LYRICA) 50 MG capsule TAKE ONE (1) CAPSULE THREE (3) TIMES EACH DAY (Patient not taking: Reported on 03/05/2021) 90 capsule 1   venlafaxine XR (EFFEXOR-XR) 150 MG 24 hr capsule Take 2 capsules (300 mg total) by mouth at bedtime. 60 capsule 2   vitamin B-12 (CYANOCOBALAMIN) 1000 MCG tablet Take 1,000 mcg by mouth daily.     No current facility-administered medications for this visit.     Musculoskeletal: Strength & Muscle Tone: na Gait & Station: na Patient leans: N/A  Psychiatric Specialty Exam: Review of Systems  All other systems reviewed and are negative.  There were no vitals taken for this visit.There is no height or weight on file to calculate BMI.  General Appearance: NA  Eye Contact:  NA  Speech:  Clear and Coherent  Volume:  Normal  Mood:  Euthymic  Affect:  NA  Thought Process:  Goal Directed  Orientation:  Full (Time, Place, and Person)  Thought Content: WDL   Suicidal Thoughts:  No  Homicidal Thoughts:  No  Memory:  Immediate;   Good Recent;   Good Remote;   NA  Judgement:  Fair  Insight:  Shallow  Psychomotor Activity:  Normal  Concentration:  Concentration: Good and Attention Span: Good  Recall:  Good  Fund of Knowledge: Fair  Language: Good  Akathisia:  No  Handed:  Right  AIMS (if indicated): not done  Assets:  Communication Skills Desire for Improvement Physical Health Resilience Social Support Talents/Skills  ADL's:  Intact  Cognition: WNL  Sleep:  Good   Screenings: GAD-7    Flowsheet Row Office Visit from 07/12/2020 in [facility]  Total GAD-7 Score 3      PHQ2-9    Flowsheet Row Video Visit from 04/30/2021  in [place] Video Visit from 02/02/2021 in [place] Video Visit from 11/14/2020 in [place] Office Visit from 07/12/2020 in [facility] Video Visit from 06/12/2020 in [facility]  PHQ-2 Total Score 0 4 5 6 2   PHQ-9 Total Score -- 20 16 19 7       Flowsheet Row Video Visit from 04/30/2021 in [facility] [facility] (Discharged) from 03/07/2021 in [place] [facility] from 03/06/2021 in [place] No Risk No Risk No Risk        Assessment and Plan: This patient is a 61 year old female with a history of depression and anxiety.  She seems to be doing better with the addition of Abilify 2 mg for augmentation with the Effexor XR 300 mg daily for depression.  She also uses Xanax 1 mg at bedtime for anxiety and sleep.  She will return to see me in 3 months   [name], MD 04/30/2021, 11:30 AM

## 2021-05-21 DIAGNOSIS — Z79899 Other long term (current) drug therapy: Secondary | ICD-10-CM | POA: Diagnosis not present

## 2021-05-21 DIAGNOSIS — M545 Low back pain, unspecified: Secondary | ICD-10-CM | POA: Diagnosis not present

## 2021-05-21 DIAGNOSIS — G8929 Other chronic pain: Secondary | ICD-10-CM | POA: Diagnosis not present

## 2021-05-21 DIAGNOSIS — E119 Type 2 diabetes mellitus without complications: Secondary | ICD-10-CM | POA: Diagnosis not present

## 2021-05-23 DIAGNOSIS — Z419 Encounter for procedure for purposes other than remedying health state, unspecified: Secondary | ICD-10-CM | POA: Diagnosis not present

## 2021-05-29 DIAGNOSIS — E1169 Type 2 diabetes mellitus with other specified complication: Secondary | ICD-10-CM | POA: Diagnosis not present

## 2021-05-29 DIAGNOSIS — I10 Essential (primary) hypertension: Secondary | ICD-10-CM | POA: Diagnosis not present

## 2021-05-29 DIAGNOSIS — Z760 Encounter for issue of repeat prescription: Secondary | ICD-10-CM | POA: Diagnosis not present

## 2021-05-29 DIAGNOSIS — E119 Type 2 diabetes mellitus without complications: Secondary | ICD-10-CM | POA: Diagnosis not present

## 2021-05-29 DIAGNOSIS — E785 Hyperlipidemia, unspecified: Secondary | ICD-10-CM | POA: Diagnosis not present

## 2021-06-11 ENCOUNTER — Telehealth (HOSPITAL_COMMUNITY): Payer: Self-pay

## 2021-06-11 NOTE — Telephone Encounter (Signed)
Medication management - Prior authorization for patient's Venlafaxine 150 mg XR, 2 at bedtime, #60 for 30 day supply submitted online with CoverMyMeds to [facility] this date.  Decision pending review.  ?

## 2021-06-11 NOTE — Telephone Encounter (Signed)
error 

## 2021-06-11 NOTE — Telephone Encounter (Signed)
Medication management - Messgae left for patient that [facility] responded back for her Venlafaxine request and the medication does not require a prior authorization.  Pt. to call back if any issues filling medications ?

## 2021-06-12 ENCOUNTER — Telehealth (HOSPITAL_COMMUNITY): Payer: Self-pay | Admitting: *Deleted

## 2021-06-12 ENCOUNTER — Telehealth (HOSPITAL_COMMUNITY): Payer: Self-pay

## 2021-06-12 NOTE — Telephone Encounter (Signed)
Prior auth for patient Charlotte Perez was sent via cover my meds. ICD 10 used was F32.1 ?

## 2021-06-12 NOTE — Telephone Encounter (Signed)
Writer s/w the pharmacist at [facility] on [address] in [place] where pt's Venlafaxine XR (Effexor-XR) '150mg'$  was sent. They stated that the quantity exceeds the allowed limit per the patient's plan. Her plan allows 1 capsule but not 2. Two (2) capsules would require a Quantity Exception. 1 capsule would go through without an issue. Please review and advise. Thank you ?

## 2021-06-13 ENCOUNTER — Telehealth (HOSPITAL_COMMUNITY): Payer: Self-pay | Admitting: *Deleted

## 2021-06-13 NOTE — Telephone Encounter (Signed)
Charlotte Perez, please find out if we need to do a PA thanks

## 2021-06-13 NOTE — Telephone Encounter (Signed)
Insurance faxed form stating that it was a duplicate and it was already approved. Staff informed patient pharmacy to call insurance to see why they are not able to fill medication and he verbalized understanding.  ?

## 2021-06-13 NOTE — Telephone Encounter (Signed)
We don't need PA ?

## 2021-06-13 NOTE — Telephone Encounter (Signed)
Office received fax from [facility] about prior auth for patient Effexor being a duplicate request and how it was approved on 06-11-2021.  ? ?Staff called pharmacy and informed pharmacist and informed him he may have to call insurance to see why it's not going through for them. He verbalized understanding and stated ok.   ?

## 2021-06-16 DIAGNOSIS — Z885 Allergy status to narcotic agent status: Secondary | ICD-10-CM | POA: Diagnosis not present

## 2021-06-16 DIAGNOSIS — Z9889 Other specified postprocedural states: Secondary | ICD-10-CM | POA: Diagnosis not present

## 2021-06-16 DIAGNOSIS — S7001XA Contusion of right hip, initial encounter: Secondary | ICD-10-CM | POA: Diagnosis not present

## 2021-06-16 DIAGNOSIS — M1611 Unilateral primary osteoarthritis, right hip: Secondary | ICD-10-CM | POA: Diagnosis not present

## 2021-06-18 DIAGNOSIS — M545 Low back pain, unspecified: Secondary | ICD-10-CM | POA: Diagnosis not present

## 2021-06-18 DIAGNOSIS — Z79899 Other long term (current) drug therapy: Secondary | ICD-10-CM | POA: Diagnosis not present

## 2021-06-18 DIAGNOSIS — G8929 Other chronic pain: Secondary | ICD-10-CM | POA: Diagnosis not present

## 2021-06-23 DIAGNOSIS — Z419 Encounter for procedure for purposes other than remedying health state, unspecified: Secondary | ICD-10-CM | POA: Diagnosis not present

## 2021-07-08 DIAGNOSIS — E119 Type 2 diabetes mellitus without complications: Secondary | ICD-10-CM | POA: Diagnosis not present

## 2021-07-08 DIAGNOSIS — Z90710 Acquired absence of both cervix and uterus: Secondary | ICD-10-CM | POA: Diagnosis not present

## 2021-07-08 DIAGNOSIS — Z886 Allergy status to analgesic agent status: Secondary | ICD-10-CM | POA: Diagnosis not present

## 2021-07-08 DIAGNOSIS — Z888 Allergy status to other drugs, medicaments and biological substances status: Secondary | ICD-10-CM | POA: Diagnosis not present

## 2021-07-08 DIAGNOSIS — I10 Essential (primary) hypertension: Secondary | ICD-10-CM | POA: Diagnosis not present

## 2021-07-08 DIAGNOSIS — Z885 Allergy status to narcotic agent status: Secondary | ICD-10-CM | POA: Diagnosis not present

## 2021-07-08 DIAGNOSIS — L255 Unspecified contact dermatitis due to plants, except food: Secondary | ICD-10-CM | POA: Diagnosis not present

## 2021-07-08 DIAGNOSIS — Z7984 Long term (current) use of oral hypoglycemic drugs: Secondary | ICD-10-CM | POA: Diagnosis not present

## 2021-07-08 DIAGNOSIS — Z79899 Other long term (current) drug therapy: Secondary | ICD-10-CM | POA: Diagnosis not present

## 2021-07-08 DIAGNOSIS — Z90722 Acquired absence of ovaries, bilateral: Secondary | ICD-10-CM | POA: Diagnosis not present

## 2021-07-09 DIAGNOSIS — Z888 Allergy status to other drugs, medicaments and biological substances status: Secondary | ICD-10-CM | POA: Diagnosis not present

## 2021-07-09 DIAGNOSIS — Z7984 Long term (current) use of oral hypoglycemic drugs: Secondary | ICD-10-CM | POA: Diagnosis not present

## 2021-07-09 DIAGNOSIS — L255 Unspecified contact dermatitis due to plants, except food: Secondary | ICD-10-CM | POA: Diagnosis not present

## 2021-07-09 DIAGNOSIS — Z79899 Other long term (current) drug therapy: Secondary | ICD-10-CM | POA: Diagnosis not present

## 2021-07-09 DIAGNOSIS — Z885 Allergy status to narcotic agent status: Secondary | ICD-10-CM | POA: Diagnosis not present

## 2021-07-16 DIAGNOSIS — M545 Low back pain, unspecified: Secondary | ICD-10-CM | POA: Diagnosis not present

## 2021-07-16 DIAGNOSIS — Z79899 Other long term (current) drug therapy: Secondary | ICD-10-CM | POA: Diagnosis not present

## 2021-07-23 DIAGNOSIS — Z419 Encounter for procedure for purposes other than remedying health state, unspecified: Secondary | ICD-10-CM | POA: Diagnosis not present

## 2021-07-25 ENCOUNTER — Telehealth (HOSPITAL_COMMUNITY): Payer: Medicaid Other | Admitting: Psychiatry

## 2021-07-25 ENCOUNTER — Other Ambulatory Visit (INDEPENDENT_AMBULATORY_CARE_PROVIDER_SITE_OTHER): Payer: Self-pay

## 2021-07-25 DIAGNOSIS — K581 Irritable bowel syndrome with constipation: Secondary | ICD-10-CM

## 2021-07-25 DIAGNOSIS — R1013 Epigastric pain: Secondary | ICD-10-CM

## 2021-07-25 MED ORDER — DICYCLOMINE HCL 10 MG PO CAPS: ORAL_CAPSULE | ORAL | 2 refills | Status: DC

## 2021-07-27 ENCOUNTER — Telehealth (HOSPITAL_COMMUNITY): Payer: Medicaid Other | Admitting: Psychiatry

## 2021-08-03 ENCOUNTER — Telehealth (HOSPITAL_COMMUNITY): Payer: Medicaid Other | Admitting: Psychiatry

## 2021-08-09 ENCOUNTER — Telehealth (INDEPENDENT_AMBULATORY_CARE_PROVIDER_SITE_OTHER): Payer: Medicaid Other | Admitting: Psychiatry

## 2021-08-09 ENCOUNTER — Encounter (HOSPITAL_COMMUNITY): Payer: Self-pay | Admitting: Psychiatry

## 2021-08-09 DIAGNOSIS — F321 Major depressive disorder, single episode, moderate: Secondary | ICD-10-CM

## 2021-08-09 MED ORDER — VENLAFAXINE HCL ER 150 MG PO CP24: 300.0000 mg | ORAL_CAPSULE | Freq: Every day | ORAL | 2 refills | Status: DC

## 2021-08-09 MED ORDER — ARIPIPRAZOLE 2 MG PO TABS
2.0000 mg | ORAL_TABLET | Freq: Every day | ORAL | 2 refills | Status: DC
Start: 2021-08-09 — End: 2021-11-05

## 2021-08-09 MED ORDER — ALPRAZOLAM 1 MG PO TABS: 1.0000 mg | ORAL_TABLET | Freq: Every evening | ORAL | 2 refills | Status: DC | PRN

## 2021-08-09 NOTE — Progress Notes (Signed)
Virtual Visit via Telephone Note  I connected with Charlotte Perez on 08/09/21 at 11:00 AM EDT by telephone and verified that I am speaking with the correct person using two identifiers.  Location: Patient: home Provider: office   I discussed the limitations, risks, security and privacy concerns of performing an evaluation and management service by telephone and the availability of in person appointments. I also discussed with the patient that there may be a patient responsible charge related to this service. The patient expressed understanding and agreed to proceed.      I discussed the assessment and treatment plan with the patient. The patient was provided an opportunity to ask questions and all were answered. The patient agreed with the plan and demonstrated an understanding of the instructions.   The patient was advised to call back or seek an in-person evaluation if the symptoms worsen or if the condition fails to improve as anticipated.  I provided 15 minutes of non-face-to-face time during this encounter.   [name], MD  [facility] MD/PA/NP OP Progress Note  08/09/2021 11:44 AM Charlotte Perez  MRN:  [id number]  Chief Complaint:  Chief Complaint  Patient presents with   Depression   Anxiety   Follow-up   HPI: This patient is a 61 year old married white female who lives with her husband in [place].  They have 3 grown children.  The husband's brother has recently been living with them as well.  She is on disability.  The patient returns for follow-up after 3 months.  For the most part she has been doing okay.  She is still trying to help her husband a lot since he has early dementia depression and chronic pain.  This seems to keep her busy for the most part.  She denies significant depression or anxiety right now and she is sleeping well most of the time.  At times she has headaches. Visit Diagnosis:    ICD-10-CM   1. 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   Arthritis    Asthma    Bipolar disorder (HCC)    Chronic back pain    Chronic neck pain    Constipation 09/05/2016   COPD (chronic obstructive pulmonary disease) (HCC)    DDD (degenerative disc disease), cervical    DDD (degenerative disc disease), lumbar    Depression    Diabetes mellitus ([place]) 06/27/2010   Diabetes mellitus, type II (HCC)    Gallbladder sludge    GERD (gastroesophageal reflux disease)    Hyperlipidemia    Hypertension    Iron deficiency anemia 09/05/2016   Lumbar radiculopathy    Mole (skin)    Neuropathy of foot    Renal cyst    Shingles    Skin lesions, generalized    1.2 CM FLAT FAWN COLOR AT THE T10 AREA JUST RIGHT OF SPINE     Past Surgical History:  Procedure Laterality Date   ABDOMINAL HYSTERECTOMY     total hysterectomy   BACK SURGERY     BIOPSY  03/07/2021   Procedure: BIOPSY;  Surgeon: Harvel Quale, MD;  [facility];  Service: Gastroenterology;;   CESAREAN SECTION     x 2   ESOPHAGOGASTRODUODENOSCOPY (EGD) WITH PROPOFOL N/A 03/07/2021   Procedure: ESOPHAGOGASTRODUODENOSCOPY (EGD) WITH PROPOFOL;  Surgeon: Montez Morita, Quillian Quince, MD;  [facility];  Service: Gastroenterology;  Laterality: N/A;  9:15   NECK SURGERY      Family Psychiatric History: see below  Family History:  Family History  Problem Relation Age of Onset   ADD / ADHD Other    COPD Father    Alcohol abuse Father    Depression Daughter    Alcohol abuse Mother    COPD Mother        on oxygen   Colon cancer Paternal Grandfather    Heart disease Sister    Heart disease Brother    Arthritis Sister        knee replacement   Diabetes Maternal Grandmother    Diabetes Sister     Social History:  Social History    Socioeconomic History   Marital status: Married    Spouse name: Ronnie   Number of children: 3   Years of education: Not on file   Highest education level: Not on file  Occupational History   Occupation: disablility  Tobacco Use   Smoking status: Passive Smoke Exposure - Never Smoker   Smokeless tobacco: Never  Vaping Use   Vaping Use: Never used  Substance and Sexual Activity   Alcohol use: No   Drug use: No   Sexual activity: Not Currently    Birth control/protection: Surgical    Comment: hyst  Other Topics Concern   Not on file  Social History Narrative   Not on file   Social Determinants of Health   Financial Resource Strain: Low Risk    Difficulty of Paying Living Expenses: Not very hard  Food Insecurity: Food Insecurity Present   Worried About Running Out of Food in the Last Year: Sometimes true   Ran Out of Food in the Last Year: Sometimes true  Transportation Needs: No Transportation Needs   Lack of Transportation (Medical): No   Lack of Transportation (Non-Medical): No  Physical Activity: Insufficiently Active   Days of Exercise per Week: 1 day   Minutes of Exercise per Session: 60 min  Stress: No Stress Concern Present   Feeling of Stress : Only a little  Social Connections: Moderately Isolated   Frequency of Communication with Friends and Family: Three times a week   Frequency of Social Gatherings with Friends and Family: Never   Attends Religious Services: Never   Marine scientist or Organizations: No   Attends Archivist Meetings: Never   Marital Status: Married    Allergies:  Allergies  Allergen Reactions   Cymbalta [Duloxetine Hcl]     Per pt it started making her mouth tingle and could not taste anything   Gabapentin     Itching, fidgety   Meloxicam Other (See Comments)    Tongue tingling, and lost sense of taste.   Tramadol Other (See Comments)    Tongue tingling, and lost sense of taste.    Metabolic Disorder  Labs: Lab Results  Component Value Date   HGBA1C 6.7 (H) 05/11/2020   No results found for: PROLACTIN Lab Results  Component Value Date   CHOL 154 05/11/2020   TRIG 59 05/11/2020   HDL 73 05/11/2020   CHOLHDL 2.1 06/09/2018   LDLCALC 69 05/11/2020   LDLCALC 135 (H) 02/09/2020   Lab Results  Component Value Date   TSH 1.44 11/16/2020   TSH 1.760 07/26/2016    Therapeutic Level Labs: No results found for: LITHIUM No results found for: VALPROATE No  components found for:  CBMZ  Current Medications: Current Outpatient Medications  Medication Sig Dispense Refill   ACCU-CHEK FASTCLIX LANCETS MISC EVERY DAY 102 each 2   albuterol (PROVENTIL) (2.5 MG/3ML) 0.083% nebulizer solution Take 3 mLs (2.5 mg total) by nebulization every 6 (six) hours as needed for wheezing or shortness of breath. 75 mL 3   ALPRAZolam (XANAX) 1 MG tablet Take 1 tablet (1 mg total) by mouth at bedtime as needed. 30 tablet 2   ARIPiprazole (ABILIFY) 2 MG tablet Take 1 tablet (2 mg total) by mouth daily. 30 tablet 2   atorvastatin (LIPITOR) 40 MG tablet TAKE ONE (1) TABLET EACH DAY 90 tablet 3   cycloSPORINE (RESTASIS) 0.05 % ophthalmic emulsion Place 1 drop into both eyes 2 (two) times daily.     dicyclomine (BENTYL) 10 MG capsule TAKE ONE CAPSULE EVERY 12 HOURS AS NEEDED 60 capsule 2   diphenhydrAMINE (BENADRYL) 25 MG tablet Take 25 mg by mouth daily as needed.     empagliflozin (JARDIANCE) 25 MG TABS tablet Take 25 mg by mouth daily.     glucose blood (ACCU-CHEK GUIDE) test strip Use daily Dx E11.9 100 each 3   HYDROcodone-acetaminophen (NORCO) 10-325 MG tablet Take 1 tablet by mouth 3 (three) times daily as needed for pain.     latanoprost (XALATAN) 0.005 % ophthalmic solution Place 1 drop into both eyes at bedtime.     linaclotide (LINZESS) 145 MCG CAPS capsule Take 1 capsule (145 mcg total) by mouth daily before breakfast. 90 capsule 3   lisinopril (ZESTRIL) 5 MG tablet TAKE ONE (1) TABLET EACH DAY (Patient  taking differently: Take 2.5 mg by mouth daily. TAKE ONE (1) TABLET EACH DAY) 90 tablet 0   Magnesium 250 MG TABS Take 500 mg by mouth daily.     metFORMIN (GLUCOPHAGE) 1000 MG tablet TAKE ONE TABLET TWICE A DAY WITH FOOD 180 tablet 0   omeprazole (PRILOSEC) 40 MG capsule TAKE ONE (1) CAPSULE EACH DAY 90 capsule 1   pregabalin (LYRICA) 50 MG capsule TAKE ONE (1) CAPSULE THREE (3) TIMES EACH DAY (Patient not taking: Reported on 03/05/2021) 90 capsule 1   venlafaxine XR (EFFEXOR-XR) 150 MG 24 hr capsule Take 2 capsules (300 mg total) by mouth at bedtime. 60 capsule 2   vitamin B-12 (CYANOCOBALAMIN) 1000 MCG tablet Take 1,000 mcg by mouth daily.     No current facility-administered medications for this visit.     Musculoskeletal: Strength & Muscle Tone: na Gait & Station: na Patient leans: N/A  Psychiatric Specialty Exam: Review of Systems  Musculoskeletal:  Positive for arthralgias and back pain.  All other systems reviewed and are negative.  There were no vitals taken for this visit.There is no height or weight on file to calculate BMI.  General Appearance: NA  Eye Contact:  NA  Speech:  Clear and Coherent  Volume:  Normal  Mood:  Euthymic  Affect:  NA  Thought Process:  Goal Directed  Orientation:  Full (Time, Place, and Person)  Thought Content: WDL   Suicidal Thoughts:  No  Homicidal Thoughts:  No  Memory:  Immediate;   Good Recent;   Good Remote;   Good  Judgement:  Good  Insight:  Fair  Psychomotor Activity:  Normal  Concentration:  Concentration: Good and Attention Span: Good  Recall:  Good  Fund of Knowledge: Good  Language: Good  Akathisia:  No  Handed:  Right  AIMS (if indicated): not done  Assets:  Communication  Skills Desire for Improvement Resilience Social Support Talents/Skills  ADL's:  Intact  Cognition: WNL  Sleep:  Fair   Screenings: GAD-7    Flowsheet Row Office Visit from 07/12/2020 in [facility]  Total GAD-7 Score 3       PHQ2-9    Flowsheet Row Video Visit from 08/09/2021 in [facility] Video Visit from 04/30/2021 in [facility] Video Visit from 02/02/2021 in [facility] Video Visit from 11/14/2020 in [facility] Office Visit from 07/12/2020 in [facility]  PHQ-2 Total Score 1 0 '4 5 6  '$ PHQ-9 Total Score -- -- '20 16 19      '$ Flowsheet Row Video Visit from 08/09/2021 in [facility] Video Visit from 04/30/2021 in [facility] Admission (Discharged) from 03/07/2021 in [facility] No Risk No Risk No Risk        Assessment and Plan: This patient is a 61 year old female with a history of depression and anxiety.  She seems to be doing better with the addition of Abilify.  She will continue Abilify 2 mg for augmentation along with Effexor XR 300 mg daily for depression.  She will also continue Xanax 1 mg at bedtime for anxiety and sleep.  She will return to see me in 3 months  Collaboration of Care: Collaboration of Care: Primary Care Provider AEB chart notes are shared with PCP through the epic system  Patient/Guardian was advised Release of Information must be obtained prior to any record release in order to collaborate their care with an outside provider. Patient/Guardian was advised if they have not already done so to contact the registration department to sign all necessary forms in order for [place] to release information regarding their care.   Consent: Patient/Guardian gives verbal consent for treatment and assignment of benefits for services provided during this visit. Patient/Guardian expressed understanding and agreed to proceed.    [name], MD 08/09/2021, 11:44 AM

## 2021-08-13 DIAGNOSIS — Z79899 Other long term (current) drug therapy: Secondary | ICD-10-CM | POA: Diagnosis not present

## 2021-08-13 DIAGNOSIS — M545 Low back pain, unspecified: Secondary | ICD-10-CM | POA: Diagnosis not present

## 2021-08-23 DIAGNOSIS — Z419 Encounter for procedure for purposes other than remedying health state, unspecified: Secondary | ICD-10-CM | POA: Diagnosis not present

## 2021-09-03 ENCOUNTER — Ambulatory Visit (INDEPENDENT_AMBULATORY_CARE_PROVIDER_SITE_OTHER): Payer: Medicaid Other | Admitting: Gastroenterology

## 2021-09-03 ENCOUNTER — Encounter (INDEPENDENT_AMBULATORY_CARE_PROVIDER_SITE_OTHER): Payer: Self-pay | Admitting: Gastroenterology

## 2021-09-03 VITALS — BP 109/68 | HR 90 | Temp 98.1°F | Ht 59.0 in | Wt 126.8 lb

## 2021-09-03 DIAGNOSIS — Z79891 Long term (current) use of opiate analgesic: Secondary | ICD-10-CM

## 2021-09-03 DIAGNOSIS — R1013 Epigastric pain: Secondary | ICD-10-CM | POA: Diagnosis not present

## 2021-09-03 DIAGNOSIS — K581 Irritable bowel syndrome with constipation: Secondary | ICD-10-CM

## 2021-09-03 DIAGNOSIS — G8929 Other chronic pain: Secondary | ICD-10-CM

## 2021-09-03 NOTE — Progress Notes (Signed)
Maylon Peppers, M.D. Gastroenterology & Hepatology [facility] [address] [place], [place] [address]  Primary Care Physician: Wilburt Finlay, MD [place] [id number]  I will communicate my assessment and recommendations to the referring MD via EMR.  Problems: IBS-C Chronic opiate use epigastric pain Gastric intestinal metaplasia  History of Present Illness: Charlotte Perez is a 61 y.o. female female with PMH asthma, ADD, bipolar disorder, COPD, diabetes, depression, IBS-C, chronic opiate use who presents for follow up of constipation and evaluation of epigastric pain.  The patient was last seen on 03/05/2021. At that time, the patient was scheduled for an EGD and continued on Linzess 145 mcg every day.  EGD was performed on 03/07/21 which showed normal esophagus, stomach and duodenum.  Biopsies from small bowel were within normal limits.  Gastric biopsies showed presence of gastrointestinal metaplasia and chronic atrophy gastritis but negative for H. pylori.  Recommended to have a repeat EGD in 3 years for gastric mapping.  She reports that she is taking Linzess 145 mcg as needed, states  that she takes it every other day usually as she does not want to have diarrhea episodes. She reports having a bowel movement almost every day.  States her abdominal pain is located in the epigastric area, is up to 3/10 in intensity and is intermittent. She takes omeprazole 40 mg qday, which somewhat helps to decrease the burning pain in her abdomen. Also takes dicyclomine every 12 hours which she believes helps her.  The patient denies having any nausea, vomiting, fever, chills, hematochezia, melena, hematemesis, abdominal distention, jaundice, pruritus or weight loss.  Believes her grandfather had gastric cancer.  Last EGD: as above Last Colonoscopy: 07/21/2015, performed at [facility] by Dr. Britta Mccreedy.  Found to have small  external and internal hemorrhoids, mild diverticulosis in the sigmoid colon.  Recommended to repeat in 10 years.  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   Arthritis    Asthma    Bipolar disorder (HCC)    Chronic back pain    Chronic neck pain    Constipation 09/05/2016   COPD (chronic obstructive pulmonary disease) (HCC)    DDD (degenerative disc disease), cervical    DDD (degenerative disc disease), lumbar    Depression    Diabetes mellitus ([place]) 06/27/2010   Diabetes mellitus, type II ([place])    Gallbladder sludge    GERD (gastroesophageal reflux disease)    Hyperlipidemia    Hypertension    Iron deficiency anemia 09/05/2016   Lumbar radiculopathy    Mole (skin)    Neuropathy of foot    Renal cyst    Shingles    Skin lesions, generalized    1.2 CM FLAT FAWN COLOR AT THE T10 AREA JUST RIGHT OF SPINE     Past Surgical History: Past Surgical History:  Procedure Laterality Date   ABDOMINAL HYSTERECTOMY     total hysterectomy   BACK SURGERY     BIOPSY  03/07/2021   Procedure: BIOPSY;  Surgeon: Harvel Quale, MD;  [facility];  Service: Gastroenterology;;   CESAREAN SECTION     x 2   ESOPHAGOGASTRODUODENOSCOPY (EGD) WITH PROPOFOL N/A 03/07/2021   Procedure: ESOPHAGOGASTRODUODENOSCOPY (EGD) WITH PROPOFOL;  Surgeon: Harvel Quale, MD;  [facility];  Service: Gastroenterology;  Laterality: N/A;  9:15   NECK SURGERY      Family  History: Family History  Problem Relation Age of Onset   ADD / ADHD Other    COPD Father    Alcohol abuse Father    Depression Daughter    Alcohol abuse Mother    COPD Mother        on oxygen   Colon cancer Paternal Grandfather    Heart disease Sister    Heart disease Brother    Arthritis Sister        knee replacement   Diabetes Maternal Grandmother    Diabetes Sister     Social History: Social History    Tobacco Use  Smoking Status Passive Smoke Exposure - Never Smoker  Smokeless Tobacco Never   Social History   Substance and Sexual Activity  Alcohol Use No   Social History   Substance and Sexual Activity  Drug Use No    Allergies: Allergies  Allergen Reactions   Cymbalta [Duloxetine Hcl]     Per pt it started making her mouth tingle and could not taste anything   Gabapentin     Itching, fidgety   Meloxicam Other (See Comments)    Tongue tingling, and lost sense of taste.   Tramadol Other (See Comments)    Tongue tingling, and lost sense of taste.    Medications: Current Outpatient Medications  Medication Sig Dispense Refill   ACCU-CHEK FASTCLIX LANCETS MISC EVERY DAY 102 each 2   albuterol (PROVENTIL) (2.5 MG/3ML) 0.083% nebulizer solution Take 3 mLs (2.5 mg total) by nebulization every 6 (six) hours as needed for wheezing or shortness of breath. 75 mL 3   ALPRAZolam (XANAX) 1 MG tablet Take 1 tablet (1 mg total) by mouth at bedtime as needed. 30 tablet 2   ARIPiprazole (ABILIFY) 2 MG tablet Take 1 tablet (2 mg total) by mouth daily. 30 tablet 2   atorvastatin (LIPITOR) 40 MG tablet TAKE ONE (1) TABLET EACH DAY 90 tablet 3   cycloSPORINE (RESTASIS) 0.05 % ophthalmic emulsion Place 1 drop into both eyes 2 (two) times daily.     dicyclomine (BENTYL) 10 MG capsule TAKE ONE CAPSULE EVERY 12 HOURS AS NEEDED 60 capsule 2   diphenhydrAMINE (BENADRYL) 25 MG tablet Take 25 mg by mouth daily as needed.     empagliflozin (JARDIANCE) 25 MG TABS tablet Take 25 mg by mouth daily.     glucose blood (ACCU-CHEK GUIDE) test strip Use daily Dx E11.9 100 each 3   HYDROcodone-acetaminophen (NORCO) 10-325 MG tablet Take 1 tablet by mouth 3 (three) times daily as needed for pain.     latanoprost (XALATAN) 0.005 % ophthalmic solution Place 1 drop into both eyes at bedtime.     linaclotide (LINZESS) 145 MCG CAPS capsule Take 1 capsule (145 mcg total) by mouth daily before breakfast. 90 capsule  3   lisinopril (ZESTRIL) 5 MG tablet TAKE ONE (1) TABLET EACH DAY (Patient taking differently: Take 2.5 mg by mouth daily. TAKE ONE (1) TABLET EACH DAY) 90 tablet 0   metFORMIN (GLUCOPHAGE) 1000 MG tablet TAKE ONE TABLET TWICE A DAY WITH FOOD 180 tablet 0   omeprazole (PRILOSEC) 40 MG capsule TAKE ONE (1) CAPSULE EACH DAY 90 capsule 1   pregabalin (LYRICA) 50 MG capsule TAKE ONE (1) CAPSULE THREE (3) TIMES EACH DAY 90 capsule 1   venlafaxine XR (EFFEXOR-XR) 150 MG 24 hr capsule Take 2 capsules (300 mg total) by mouth at bedtime. 60 capsule 2   vitamin B-12 (CYANOCOBALAMIN) 1000 MCG tablet Take 1,000 mcg by mouth daily.  No current facility-administered medications for this visit.    Review of Systems: GENERAL: negative for malaise, night sweats HEENT: No changes in hearing or vision, no nose bleeds or other nasal problems. NECK: Negative for lumps, goiter, pain and significant neck swelling RESPIRATORY: Negative for cough, wheezing CARDIOVASCULAR: Negative for chest pain, leg swelling, palpitations, orthopnea GI: SEE HPI MUSCULOSKELETAL: Negative for joint pain or swelling, back pain, and muscle pain. SKIN: Negative for lesions, rash PSYCH: Negative for sleep disturbance, mood disorder and recent psychosocial stressors. HEMATOLOGY Negative for prolonged bleeding, bruising easily, and swollen nodes. ENDOCRINE: Negative for cold or heat intolerance, polyuria, polydipsia and goiter. NEURO: negative for tremor, gait imbalance, syncope and seizures. The remainder of the review of systems is noncontributory.   Physical Exam: BP 109/68 (BP Location: Left Arm, Patient Position: Sitting, Cuff Size: Small)   Pulse 90   Temp 98.1 F (36.7 C) (Oral)   Ht '4\' 11"'$  (1.499 m)   Wt 126 lb 12.8 oz (57.5 kg)   BMI 25.61 kg/m  GENERAL: The patient is AO x3, in no acute distress. HEENT: Head is normocephalic and atraumatic. EOMI are intact. Mouth is well hydrated and without lesions. NECK: Supple.  No masses LUNGS: Clear to auscultation. No presence of rhonchi/wheezing/rales. Adequate chest expansion HEART: RRR, normal s1 and s2. ABDOMEN: mildly tender in the periumbilical area, no guarding, no peritoneal signs, and nondistended. BS +. No masses. EXTREMITIES: Without any cyanosis, clubbing, rash, lesions or edema. NEUROLOGIC: AOx3, no focal motor deficit. SKIN: no jaundice, no rashes  Imaging/Labs: as above  I personally reviewed and interpreted the available labs, imaging and endoscopic files.  Impression and Plan: Charlotte Perez is a 61 y.o. female female with PMH asthma, ADD, bipolar disorder, COPD, diabetes, depression, IBS-C, chronic opiate use who presents for follow up of constipation and evaluation of epigastric pain.  The patient has presented recurrent episodes of abdominal pain and history of chronic constipation which have actually improved with Linzess.  She underwent recent endoscopic evaluation that only showed presence of gastrointestinal metaplasia but no other concerning features in endoscopy.  Her symptomatology is suggestive of IBS-C exacerbated by underlying chronic opiate use.  She has presented improvement with a combination of antispasmodic, PPI and Linzess which she will continue for now.  If her abdominal pain were to worsen or if she presents any red flag signs, we will explore her symptoms further with a CT of the abdomen and pelvis with IV contrast.  - Continue Bentyl as needed for abdominal pain - Continue Linzess 145 mcg qday - Continue omeprazole 40 mg qday - If abdominal pain worsens, patient will let us know and we will proceed with CT abdomen and pelvis with IV contrast  All questions were answered.      Harvel Quale, MD Gastroenterology and Hepatology [facility]

## 2021-09-03 NOTE — Patient Instructions (Signed)
Continue Bentyl as needed for abdominal pain Continue Linzess 145 mcg qday Continue omeprazole 40 mg qday If abdominal pain worsens, please let us know and we will proceed with CT abdomen and pelvis with Iv contras

## 2021-09-11 DIAGNOSIS — F419 Anxiety disorder, unspecified: Secondary | ICD-10-CM | POA: Diagnosis not present

## 2021-09-11 DIAGNOSIS — I10 Essential (primary) hypertension: Secondary | ICD-10-CM | POA: Diagnosis not present

## 2021-09-11 DIAGNOSIS — E119 Type 2 diabetes mellitus without complications: Secondary | ICD-10-CM | POA: Diagnosis not present

## 2021-09-11 DIAGNOSIS — Z7722 Contact with and (suspected) exposure to environmental tobacco smoke (acute) (chronic): Secondary | ICD-10-CM | POA: Diagnosis not present

## 2021-09-11 DIAGNOSIS — R0602 Shortness of breath: Secondary | ICD-10-CM | POA: Diagnosis not present

## 2021-09-11 DIAGNOSIS — R079 Chest pain, unspecified: Secondary | ICD-10-CM | POA: Diagnosis not present

## 2021-09-11 DIAGNOSIS — R110 Nausea: Secondary | ICD-10-CM | POA: Diagnosis not present

## 2021-09-11 DIAGNOSIS — Z90722 Acquired absence of ovaries, bilateral: Secondary | ICD-10-CM | POA: Diagnosis not present

## 2021-09-11 DIAGNOSIS — L299 Pruritus, unspecified: Secondary | ICD-10-CM | POA: Diagnosis not present

## 2021-09-11 DIAGNOSIS — R0789 Other chest pain: Secondary | ICD-10-CM | POA: Diagnosis not present

## 2021-09-11 DIAGNOSIS — Z90710 Acquired absence of both cervix and uterus: Secondary | ICD-10-CM | POA: Diagnosis not present

## 2021-09-11 DIAGNOSIS — H9319 Tinnitus, unspecified ear: Secondary | ICD-10-CM | POA: Diagnosis not present

## 2021-09-11 DIAGNOSIS — R42 Dizziness and giddiness: Secondary | ICD-10-CM | POA: Diagnosis not present

## 2021-09-11 DIAGNOSIS — Z885 Allergy status to narcotic agent status: Secondary | ICD-10-CM | POA: Diagnosis not present

## 2021-09-18 DIAGNOSIS — M545 Low back pain, unspecified: Secondary | ICD-10-CM | POA: Diagnosis not present

## 2021-09-18 DIAGNOSIS — Z79899 Other long term (current) drug therapy: Secondary | ICD-10-CM | POA: Diagnosis not present

## 2021-09-20 DIAGNOSIS — Z79899 Other long term (current) drug therapy: Secondary | ICD-10-CM | POA: Diagnosis not present

## 2021-09-22 DIAGNOSIS — Z419 Encounter for procedure for purposes other than remedying health state, unspecified: Secondary | ICD-10-CM | POA: Diagnosis not present

## 2021-10-16 DIAGNOSIS — E119 Type 2 diabetes mellitus without complications: Secondary | ICD-10-CM | POA: Diagnosis not present

## 2021-10-16 DIAGNOSIS — M545 Low back pain, unspecified: Secondary | ICD-10-CM | POA: Diagnosis not present

## 2021-10-16 DIAGNOSIS — E2740 Unspecified adrenocortical insufficiency: Secondary | ICD-10-CM | POA: Diagnosis not present

## 2021-10-16 DIAGNOSIS — Z79899 Other long term (current) drug therapy: Secondary | ICD-10-CM | POA: Diagnosis not present

## 2021-10-16 DIAGNOSIS — D492 Neoplasm of unspecified behavior of bone, soft tissue, and skin: Secondary | ICD-10-CM | POA: Diagnosis not present

## 2021-10-17 ENCOUNTER — Telehealth: Payer: Self-pay | Admitting: Orthopaedic Surgery

## 2021-10-17 NOTE — Telephone Encounter (Signed)
Called patient per voice message received earlier today requesting a call back. Reached patient's voice mail, left message.

## 2021-10-18 DIAGNOSIS — Z79899 Other long term (current) drug therapy: Secondary | ICD-10-CM | POA: Diagnosis not present

## 2021-10-23 DIAGNOSIS — Z419 Encounter for procedure for purposes other than remedying health state, unspecified: Secondary | ICD-10-CM | POA: Diagnosis not present

## 2021-10-24 ENCOUNTER — Other Ambulatory Visit (INDEPENDENT_AMBULATORY_CARE_PROVIDER_SITE_OTHER): Payer: Self-pay | Admitting: Gastroenterology

## 2021-10-24 DIAGNOSIS — R1013 Epigastric pain: Secondary | ICD-10-CM

## 2021-10-24 DIAGNOSIS — K581 Irritable bowel syndrome with constipation: Secondary | ICD-10-CM

## 2021-10-24 NOTE — Telephone Encounter (Signed)
Last office visit 09/03/21

## 2021-10-25 ENCOUNTER — Telehealth (HOSPITAL_COMMUNITY): Payer: Self-pay | Admitting: *Deleted

## 2021-10-25 NOTE — Telephone Encounter (Signed)
Okay thanks, please change her phone number in the chart

## 2021-10-25 NOTE — Telephone Encounter (Signed)
Per pt she just wanted provider to know that she and her husband are no longer together. [phone].

## 2021-11-05 ENCOUNTER — Encounter (HOSPITAL_COMMUNITY): Payer: Self-pay | Admitting: Psychiatry

## 2021-11-05 ENCOUNTER — Telehealth (HOSPITAL_COMMUNITY): Payer: Self-pay

## 2021-11-05 ENCOUNTER — Telehealth (INDEPENDENT_AMBULATORY_CARE_PROVIDER_SITE_OTHER): Payer: Medicaid Other | Admitting: Psychiatry

## 2021-11-05 DIAGNOSIS — F411 Generalized anxiety disorder: Secondary | ICD-10-CM | POA: Diagnosis not present

## 2021-11-05 DIAGNOSIS — F321 Major depressive disorder, single episode, moderate: Secondary | ICD-10-CM

## 2021-11-05 MED ORDER — ARIPIPRAZOLE 2 MG PO TABS: 2.0000 mg | ORAL_TABLET | Freq: Every day | ORAL | 2 refills | Status: DC

## 2021-11-05 MED ORDER — ALPRAZOLAM 1 MG PO TABS: 1.0000 mg | ORAL_TABLET | Freq: Every evening | ORAL | 2 refills | Status: DC | PRN

## 2021-11-05 MED ORDER — VENLAFAXINE HCL ER 150 MG PO CP24: 300.0000 mg | ORAL_CAPSULE | Freq: Every day | ORAL | 2 refills | Status: DC

## 2021-11-05 NOTE — Telephone Encounter (Signed)
new current phone number and reported address change to [address], [place].  Informed pt this nurse was submitting her medication prior authorization for continued Venlafaxine XR.  Completed prior authorization online with CoverMyMeds and was submitted to [facility] for review.  Approval pending.

## 2021-11-05 NOTE — Progress Notes (Signed)
Virtual Visit via Telephone Note  I connected with Charlotte Perez on 11/05/21 at  9:20 AM EDT by telephone and verified that I am speaking with the correct person using two identifiers.  Location: Patient: home Provider: home office   I discussed the limitations, risks, security and privacy concerns of performing an evaluation and management service by telephone and the availability of in person appointments. I also discussed with the patient that there may be a patient responsible charge related to this service. The patient expressed understanding and agreed to proceed.       I discussed the assessment and treatment plan with the patient. The patient was provided an opportunity to ask questions and all were answered. The patient agreed with the plan and demonstrated an understanding of the instructions.   The patient was advised to call back or seek an in-person evaluation if the symptoms worsen or if the condition fails to improve as anticipated.  I provided 15 minutes of non-face-to-face time during this encounter.   [name], MD  [facility] MD/PA/NP OP Progress Note  11/05/2021 10:16 AM Charlotte Perez  MRN:  [id number]  Chief Complaint:  Chief Complaint  Patient presents with   Anxiety   Depression   Follow-up   HPI: This patient is a 61 year old separated white female who is now living with her sister in [place].  She is on disability.  The patient returns for follow-up after 3 months.  She tells me that she moved out from her husband about a week ago.  She states that she could not take his negative behavior.  She states he was constantly berating her and putting her down.  She knows he has mild dementia but she could not be his caregiver anymore.  She states that since she moved in with her sister last week she has been feeling like a new person.  She is no longer having as much pain in her back.  She is sleeping better.  She feels like "the stress was lifted off my  shoulders."  She is still compliant with her antidepressant but does not use the Xanax every day only as needed.  She denies thoughts of self-harm or suicide.  Her energy is good and she has been getting out more. Visit Diagnosis:    ICD-10-CM   1. Moderate single current episode of major depressive disorder ([place])  F32.1     2. 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   Arthritis    Asthma    Bipolar disorder ([place])    Chronic back pain    Chronic neck pain    Constipation 09/05/2016   COPD (chronic obstructive pulmonary disease) ([place])    DDD (degenerative disc disease), cervical    DDD (degenerative disc disease), lumbar    Depression    Diabetes mellitus ([place]) 06/27/2010   Diabetes mellitus, type II ([place])    Gallbladder sludge    GERD (gastroesophageal reflux disease)    Hyperlipidemia    Hypertension    Iron deficiency anemia 09/05/2016   Lumbar radiculopathy    Mole (skin)    Neuropathy of foot    Renal cyst    Shingles    Skin lesions, generalized    1.2 CM FLAT FAWN COLOR AT THE T10 AREA JUST RIGHT OF SPINE  Past Surgical History:  Procedure Laterality Date   ABDOMINAL HYSTERECTOMY     total hysterectomy   BACK SURGERY     BIOPSY  03/07/2021   Procedure: BIOPSY;  Surgeon: Montez Morita, Quillian Quince, MD;  [facility];  Service: Gastroenterology;;   CESAREAN SECTION     x 2   ESOPHAGOGASTRODUODENOSCOPY (EGD) WITH PROPOFOL N/A 03/07/2021   Procedure: ESOPHAGOGASTRODUODENOSCOPY (EGD) WITH PROPOFOL;  Surgeon: Harvel Quale, MD;  [facility];  Service: Gastroenterology;  Laterality: N/A;  9:15   NECK SURGERY      Family Psychiatric History: see below  Family History:  Family History  Problem Relation Age of Onset   ADD / ADHD Other     COPD Father    Alcohol abuse Father    Depression Daughter    Alcohol abuse Mother    COPD Mother        on oxygen   Colon cancer Paternal Grandfather    Heart disease Sister    Heart disease Brother    Arthritis Sister        knee replacement   Diabetes Maternal Grandmother    Diabetes Sister     Social History:  Social History   Socioeconomic History   Marital status: Married    Spouse name: Ronnie   Number of children: 3   Years of education: Not on file   Highest education level: Not on file  Occupational History   Occupation: disablility  Tobacco Use   Smoking status: Passive Smoke Exposure - Never Smoker   Smokeless tobacco: Never  Vaping Use   Vaping Use: Never used  Substance and Sexual Activity   Alcohol use: No   Drug use: No   Sexual activity: Not Currently    Birth control/protection: Surgical    Comment: hyst  Other Topics Concern   Not on file  Social History Narrative   Not on file   Social Determinants of Health   Financial Resource Strain: Low Risk  (11/30/2020)   Overall Financial Resource Strain (CARDIA)    Difficulty of Paying Living Expenses: Not very hard  Food Insecurity: Food Insecurity Present (11/30/2020)   Hunger Vital Sign    Worried About Running Out of Food in the Last Year: Sometimes true    Ran Out of Food in the Last Year: Sometimes true  Transportation Needs: No Transportation Needs (11/30/2020)   PRAPARE - Hydrologist (Medical): No    Lack of Transportation (Non-Medical): No  Physical Activity: Insufficiently Active (11/30/2020)   Exercise Vital Sign    Days of Exercise per Week: 1 day    Minutes of Exercise per Session: 60 min  Stress: No Stress Concern Present (11/30/2020)   [place]    Feeling of Stress : Only a little  Social Connections: Moderately Isolated (11/30/2020)   Social Connection and Isolation Panel [NHANES]    Frequency  of Communication with Friends and Family: Three times a week    Frequency of Social Gatherings with Friends and Family: Never    Attends Religious Services: Never    Marine scientist or Organizations: No    Attends Archivist Meetings: Never    Marital Status: Married    Allergies:  Allergies  Allergen Reactions   Cymbalta [Duloxetine Hcl]     Per pt it started making her mouth tingle and could not taste anything   Gabapentin  Itching, fidgety   Meloxicam Other (See Comments)    Tongue tingling, and lost sense of taste.   Tramadol Other (See Comments)    Tongue tingling, and lost sense of taste.    Metabolic Disorder Labs: Lab Results  Component Value Date   HGBA1C 6.7 (H) 05/11/2020   No results found for: "PROLACTIN" Lab Results  Component Value Date   CHOL 154 05/11/2020   TRIG 59 05/11/2020   HDL 73 05/11/2020   CHOLHDL 2.1 06/09/2018   LDLCALC 69 05/11/2020   LDLCALC 135 (H) 02/09/2020   Lab Results  Component Value Date   TSH 1.44 11/16/2020   TSH 1.760 07/26/2016    Therapeutic Level Labs: No results found for: "LITHIUM" No results found for: "VALPROATE" No results found for: "CBMZ"  Current Medications: Current Outpatient Medications  Medication Sig Dispense Refill   ACCU-CHEK FASTCLIX LANCETS MISC EVERY DAY 102 each 2   albuterol (PROVENTIL) (2.5 MG/3ML) 0.083% nebulizer solution Take 3 mLs (2.5 mg total) by nebulization every 6 (six) hours as needed for wheezing or shortness of breath. 75 mL 3   ALPRAZolam (XANAX) 1 MG tablet Take 1 tablet (1 mg total) by mouth at bedtime as needed. 30 tablet 2   ARIPiprazole (ABILIFY) 2 MG tablet Take 1 tablet (2 mg total) by mouth daily. 30 tablet 2   atorvastatin (LIPITOR) 40 MG tablet TAKE ONE (1) TABLET EACH DAY 90 tablet 3   cycloSPORINE (RESTASIS) 0.05 % ophthalmic emulsion Place 1 drop into both eyes 2 (two) times daily.     dicyclomine (BENTYL) 10 MG capsule TAKE ONE CAPSULE EVERY 12 HOURS  AS NEEDED 60 capsule 2   diphenhydrAMINE (BENADRYL) 25 MG tablet Take 25 mg by mouth daily as needed.     empagliflozin (JARDIANCE) 25 MG TABS tablet Take 25 mg by mouth daily.     glucose blood (ACCU-CHEK GUIDE) test strip Use daily Dx E11.9 100 each 3   HYDROcodone-acetaminophen (NORCO) 10-325 MG tablet Take 1 tablet by mouth 3 (three) times daily as needed for pain.     latanoprost (XALATAN) 0.005 % ophthalmic solution Place 1 drop into both eyes at bedtime.     linaclotide (LINZESS) 145 MCG CAPS capsule Take 1 capsule (145 mcg total) by mouth daily before breakfast. 90 capsule 3   lisinopril (ZESTRIL) 5 MG tablet TAKE ONE (1) TABLET EACH DAY (Patient taking differently: Take 2.5 mg by mouth daily. TAKE ONE (1) TABLET EACH DAY) 90 tablet 0   metFORMIN (GLUCOPHAGE) 1000 MG tablet TAKE ONE TABLET TWICE A DAY WITH FOOD 180 tablet 0   omeprazole (PRILOSEC) 40 MG capsule TAKE ONE (1) CAPSULE EACH DAY 90 capsule 1   pregabalin (LYRICA) 50 MG capsule TAKE ONE (1) CAPSULE THREE (3) TIMES EACH DAY 90 capsule 1   venlafaxine XR (EFFEXOR-XR) 150 MG 24 hr capsule Take 2 capsules (300 mg total) by mouth at bedtime. 60 capsule 2   vitamin B-12 (CYANOCOBALAMIN) 1000 MCG tablet Take 1,000 mcg by mouth daily.     No current facility-administered medications for this visit.     Musculoskeletal: Strength & Muscle Tone: na Gait & Station: na Patient leans: N/A  Psychiatric Specialty Exam: Review of Systems  Musculoskeletal:  Positive for back pain.  All other systems reviewed and are negative.   There were no vitals taken for this visit.There is no height or weight on file to calculate BMI.  General Appearance: NA  Eye Contact:  NA  Speech:  Clear and  Coherent  Volume:  Normal  Mood:  Euthymic  Affect:  NA  Thought Process:  Goal Directed  Orientation:  Full (Time, Place, and Person)  Thought Content: WDL   Suicidal Thoughts:  No  Homicidal Thoughts:  No  Memory:  Immediate;   Good Recent;    Good Remote;   Fair  Judgement:  Good  Insight:  Good  Psychomotor Activity:  Normal  Concentration:  Concentration: Good and Attention Span: Good  Recall:  Good  Fund of Knowledge: Good  Language: Good  Akathisia:  No  Handed:  Right  AIMS (if indicated): not done  Assets:  Communication Skills Desire for Improvement Resilience Social Support  ADL's:  Intact  Cognition: WNL  Sleep:  Good   Screenings: GAD-7    Flowsheet Row Office Visit from 07/12/2020 in [facility]  Total GAD-7 Score 3      PHQ2-9    Flowsheet Row Video Visit from 11/05/2021 in [facility] Video Visit from 08/09/2021 in [facility] Video Visit from 04/30/2021 in [facility] Video Visit from 02/02/2021 in [facility] Video Visit from 11/14/2020 in [facility]  PHQ-2 Total Score 0 1 0 4 5  PHQ-9 Total Score -- -- -- 20 16      Flowsheet Row Video Visit from 11/05/2021 in [facility] Video Visit from 08/09/2021 in [facility] Video Visit from 04/30/2021 in [facility] No Risk No Risk No Risk        Assessment and Plan: This patient is a 61 year old female with a history of depression and anxiety.  She seems much better since she moved away from her husband.  She will continue Effexor XR 300 mg daily for depression, Abilify 2 mg daily for augmentation and Xanax 1 mg at bedtime for anxiety and sleep as needed.  She will return to see me in 3 months  Collaboration of Care: Collaboration of Care: Primary Care Provider AEB notes are shared with PCP on the epic system  Patient/Guardian was advised Release of Information must be obtained prior to any  record release in order to collaborate their care with an outside provider. Patient/Guardian was advised if they have not already done so to contact the registration department to sign all necessary forms in order for [place] to release information regarding their care.   Consent: Patient/Guardian gives verbal consent for treatment and assignment of benefits for services provided during this visit. Patient/Guardian expressed understanding and agreed to proceed.    [name], MD 11/05/2021, 10:16 AM

## 2021-11-12 ENCOUNTER — Telehealth (HOSPITAL_COMMUNITY): Payer: Self-pay | Admitting: *Deleted

## 2021-11-12 NOTE — Telephone Encounter (Signed)
PA for patient Effexor 300 mg total was completed. Waiting for appeal determination. PA number is [id number].

## 2021-11-14 DIAGNOSIS — Z79899 Other long term (current) drug therapy: Secondary | ICD-10-CM | POA: Diagnosis not present

## 2021-11-14 DIAGNOSIS — M545 Low back pain, unspecified: Secondary | ICD-10-CM | POA: Diagnosis not present

## 2021-11-14 DIAGNOSIS — E119 Type 2 diabetes mellitus without complications: Secondary | ICD-10-CM | POA: Diagnosis not present

## 2021-11-19 DIAGNOSIS — R42 Dizziness and giddiness: Secondary | ICD-10-CM | POA: Diagnosis not present

## 2021-11-19 DIAGNOSIS — H9313 Tinnitus, bilateral: Secondary | ICD-10-CM | POA: Diagnosis not present

## 2021-11-21 DIAGNOSIS — E119 Type 2 diabetes mellitus without complications: Secondary | ICD-10-CM | POA: Diagnosis not present

## 2021-11-21 DIAGNOSIS — H2513 Age-related nuclear cataract, bilateral: Secondary | ICD-10-CM | POA: Diagnosis not present

## 2021-11-21 DIAGNOSIS — H40013 Open angle with borderline findings, low risk, bilateral: Secondary | ICD-10-CM | POA: Diagnosis not present

## 2021-11-23 DIAGNOSIS — Z419 Encounter for procedure for purposes other than remedying health state, unspecified: Secondary | ICD-10-CM | POA: Diagnosis not present

## 2021-11-28 ENCOUNTER — Telehealth (HOSPITAL_COMMUNITY): Payer: Self-pay | Admitting: *Deleted

## 2021-11-28 ENCOUNTER — Emergency Department (HOSPITAL_COMMUNITY)
Admission: EM | Admit: 2021-11-28 | Discharge: 2021-11-29 | Disposition: A | Discharge status: Home / Self Care | Payer: Medicaid Other | Attending: Emergency Medicine | Admitting: Emergency Medicine

## 2021-11-28 ENCOUNTER — Other Ambulatory Visit: Payer: Self-pay

## 2021-11-28 ENCOUNTER — Encounter (HOSPITAL_COMMUNITY): Payer: Self-pay

## 2021-11-28 DIAGNOSIS — F29 Unspecified psychosis not due to a substance or known physiological condition: Secondary | ICD-10-CM | POA: Insufficient documentation

## 2021-11-28 DIAGNOSIS — R45851 Suicidal ideations: Secondary | ICD-10-CM | POA: Insufficient documentation

## 2021-11-28 DIAGNOSIS — Z7984 Long term (current) use of oral hypoglycemic drugs: Secondary | ICD-10-CM | POA: Insufficient documentation

## 2021-11-28 DIAGNOSIS — E119 Type 2 diabetes mellitus without complications: Secondary | ICD-10-CM | POA: Diagnosis not present

## 2021-11-28 DIAGNOSIS — Z79899 Other long term (current) drug therapy: Secondary | ICD-10-CM | POA: Insufficient documentation

## 2021-11-28 DIAGNOSIS — F32A Depression, unspecified: Secondary | ICD-10-CM | POA: Diagnosis present

## 2021-11-28 DIAGNOSIS — R9431 Abnormal electrocardiogram [ECG] [EKG]: Secondary | ICD-10-CM | POA: Diagnosis not present

## 2021-11-28 DIAGNOSIS — I10 Essential (primary) hypertension: Secondary | ICD-10-CM | POA: Diagnosis not present

## 2021-11-28 DIAGNOSIS — F329 Major depressive disorder, single episode, unspecified: Secondary | ICD-10-CM | POA: Insufficient documentation

## 2021-11-28 LAB — URINALYSIS, ROUTINE W REFLEX MICROSCOPIC
Bilirubin Urine: NEGATIVE
Glucose, UA: >=500 mg/dL — AB
Hgb urine dipstick: NEGATIVE
Ketones, ur: NEGATIVE mg/dL
Nitrite: NEGATIVE
Protein, ur: NEGATIVE mg/dL
Specific Gravity, Urine: 1.016 (ref 1.005–1.030)
pH: 5 (ref 5.0–8.0)

## 2021-11-28 LAB — CBC
HCT: 38.3 % (ref 36.0–46.0)
Hemoglobin: 12.3 g/dL (ref 12.0–15.0)
MCH: 29.4 pg (ref 26.0–34.0)
MCHC: 32.1 g/dL (ref 30.0–36.0)
MCV: 91.4 fL (ref 80.0–100.0)
Platelets: 216 10*3/uL (ref 150–400)
RBC: 4.19 MIL/uL (ref 3.87–5.11)
RDW: 13.5 % (ref 11.5–15.5)
WBC: 5.3 10*3/uL (ref 4.0–10.5)
nRBC: 0 % (ref 0.0–0.2)

## 2021-11-28 LAB — BASIC METABOLIC PANEL
Anion gap: 9 (ref 5–15)
BUN: 15 mg/dL (ref 8–23)
CO2: 26 mmol/L (ref 22–32)
Calcium: 9.6 mg/dL (ref 8.9–10.3)
Chloride: 105 mmol/L (ref 98–111)
Creatinine, Ser: 0.71 mg/dL (ref 0.44–1.00)
GFR, Estimated: >60 mL/min (ref >60)
Glucose, Bld: 104 mg/dL — ABNORMAL HIGH (ref 70–99)
Potassium: 4.3 mmol/L (ref 3.5–5.1)
Sodium: 140 mmol/L (ref 135–145)

## 2021-11-28 LAB — ACETAMINOPHEN LEVEL: Acetaminophen (Tylenol), Serum: <10 ug/mL — ABNORMAL LOW (ref 10–30)

## 2021-11-28 LAB — ETHANOL: Alcohol, Ethyl (B): <10 mg/dL (ref <10)

## 2021-11-28 LAB — RAPID URINE DRUG SCREEN, HOSP PERFORMED
Amphetamines: NOT DETECTED
Barbiturates: NOT DETECTED
Benzodiazepines: NOT DETECTED
Cocaine: NOT DETECTED
Opiates: NOT DETECTED
Tetrahydrocannabinol: NOT DETECTED

## 2021-11-28 LAB — SALICYLATE LEVEL: Salicylate Lvl: <7 mg/dL — ABNORMAL LOW (ref 7.0–30.0)

## 2021-11-28 MED ORDER — PREGABALIN 50 MG PO CAPS
50.0000 mg | ORAL_CAPSULE | Freq: Three times a day (TID) | ORAL | Status: DC
  Administered 2021-11-28 – 2021-11-29 (×2): 50 mg via ORAL
  Filled 2021-11-28 (×2): qty 1

## 2021-11-28 MED ORDER — EMPAGLIFLOZIN 25 MG PO TABS
25.0000 mg | ORAL_TABLET | Freq: Every day | ORAL | Status: DC
  Administered 2021-11-29: 25 mg via ORAL
  Filled 2021-11-28 (×3): qty 1

## 2021-11-28 MED ORDER — ARIPIPRAZOLE 2 MG PO TABS
2.0000 mg | ORAL_TABLET | Freq: Every day | ORAL | Status: DC
  Administered 2021-11-29: 2 mg via ORAL
  Filled 2021-11-28 (×3): qty 1

## 2021-11-28 MED ORDER — VENLAFAXINE HCL ER 37.5 MG PO CP24
300.0000 mg | ORAL_CAPSULE | Freq: Every day | ORAL | Status: DC
  Administered 2021-11-28: 300 mg via ORAL
  Filled 2021-11-28: qty 8

## 2021-11-28 MED ORDER — HYDROCODONE-ACETAMINOPHEN 10-325 MG PO TABS: 1.0000 | ORAL_TABLET | Freq: Three times a day (TID) | ORAL | Status: DC | PRN

## 2021-11-28 MED ORDER — VITAMIN B-12 1000 MCG PO TABS
1000.0000 ug | ORAL_TABLET | Freq: Every day | ORAL | Status: DC
  Administered 2021-11-29: 1000 ug via ORAL
  Filled 2021-11-28: qty 1

## 2021-11-28 MED ORDER — ALPRAZOLAM 0.5 MG PO TABS
1.0000 mg | ORAL_TABLET | Freq: Every evening | ORAL | Status: DC | PRN
  Administered 2021-11-28: 1 mg via ORAL
  Filled 2021-11-28: qty 2

## 2021-11-28 MED ORDER — PANTOPRAZOLE SODIUM 40 MG PO TBEC
40.0000 mg | DELAYED_RELEASE_TABLET | Freq: Every day | ORAL | Status: DC
  Administered 2021-11-29: 40 mg via ORAL
  Filled 2021-11-28: qty 1

## 2021-11-28 MED ORDER — METFORMIN HCL 500 MG PO TABS
1000.0000 mg | ORAL_TABLET | Freq: Two times a day (BID) | ORAL | Status: DC
  Administered 2021-11-28 – 2021-11-29 (×2): 1000 mg via ORAL
  Filled 2021-11-28 (×2): qty 2
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
## 2021-11-28 NOTE — ED Notes (Signed)
Pt dressed into burgundy scrubs and non-slip socks. Pt belongings included a dress, tennis shoes, two rings, a phone, and a handbag. Pt belongings put into a bag, labeled with pt stickers, and locked in a psych locker. Pt also had $106 in their handbag. Pt's money given to security and locked in the vault.
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
## 2021-11-29 DIAGNOSIS — R45851 Suicidal ideations: Secondary | ICD-10-CM

## 2021-11-29 NOTE — ED Provider Notes (Signed)
Emergency Medicine Observation Re-evaluation Note  Charlotte Perez is a 62 y.o. female, seen on rounds today.  Pt initially presented to the ED for complaints of Suicidal Currently, the patient is calm, resting in bed, no acute distress and eating a sandwich  Physical Exam  BP 104/67 (BP Location: Left Arm)   Pulse 91   Temp 98.2 F (36.8 C)   Resp 18   Ht '4\' 11"'$  (1.499 m)   Wt 58.1 kg   SpO2 97%   BMI 25.85 kg/m  Physical Exam General: Calm well-appearing nontoxic Cardiac: Regular rate Lungs: Breathing comfortably Psych: Calm and in no acute distress  ED Course / MDM  EKG:   I have reviewed the labs performed to date as well as medications administered while in observation.    Plan  Current plan is for psychiatrically cleared by psych provider Ntuen, Otila Kluver.  Patient feels safe with discharge home, she says she has plans to see the psychiatrist in the outpatient setting.  She has no acute plans for suicide attempt.  She is medically cleared.  She is safe for discharge.    Elgie Congo, MD 11/29/21 2358

## 2021-11-29 NOTE — Discharge Instructions (Signed)
Call 911 or come back to the ER if you have any new thoughts or plans to kill yourself, kill others or hurt others, or having new hallucinations or concerning thoughts.

## 2021-11-29 NOTE — ED Provider Notes (Signed)
Emergency Medicine Observation Re-evaluation Note  Charlotte Perez is a 61 y.o. female, seen on rounds today.  Pt initially presented to the ED for complaints of Suicidal Currently, the patient is resting quietly.  Physical Exam  BP 94/72 (BP Location: Right Arm)   Pulse 84   Temp 98.4 F (36.9 C)   Resp 18   Ht '4\' 11"'$  (1.499 m)   Wt 58.1 kg   SpO2 97%   BMI 25.85 kg/m  Physical Exam General: No acute distress Cardiac: Well-perfused Lungs: Nonlabored Psych: Cooperative  ED Course / MDM  EKG:   I have reviewed the labs performed to date as well as medications administered while in observation.  Recent changes in the last 24 hours include TTS eval.  Plan  Current plan is for reassessment this morning.    Hayden Rasmussen, MD 11/29/21 [phone]
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
## 2021-12-05 ENCOUNTER — Ambulatory Visit (INDEPENDENT_AMBULATORY_CARE_PROVIDER_SITE_OTHER): Payer: Medicaid Other | Admitting: Clinical

## 2021-12-05 ENCOUNTER — Encounter (HOSPITAL_COMMUNITY): Payer: Self-pay

## 2021-12-05 DIAGNOSIS — F411 Generalized anxiety disorder: Secondary | ICD-10-CM | POA: Diagnosis not present

## 2021-12-05 DIAGNOSIS — F321 Major depressive disorder, single episode, moderate: Secondary | ICD-10-CM | POA: Diagnosis not present
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
## 2021-12-05 NOTE — Plan of Care (Signed)
Verbal consent  

## 2021-12-07 DIAGNOSIS — Z1231 Encounter for screening mammogram for malignant neoplasm of breast: Secondary | ICD-10-CM | POA: Diagnosis not present

## 2021-12-13 DIAGNOSIS — M545 Low back pain, unspecified: Secondary | ICD-10-CM | POA: Diagnosis not present

## 2021-12-13 DIAGNOSIS — E119 Type 2 diabetes mellitus without complications: Secondary | ICD-10-CM | POA: Diagnosis not present

## 2021-12-13 DIAGNOSIS — Z79899 Other long term (current) drug therapy: Secondary | ICD-10-CM | POA: Diagnosis not present

## 2021-12-17 DIAGNOSIS — Z79899 Other long term (current) drug therapy: Secondary | ICD-10-CM | POA: Diagnosis not present

## 2021-12-23 DIAGNOSIS — Z419 Encounter for procedure for purposes other than remedying health state, unspecified: Secondary | ICD-10-CM | POA: Diagnosis not present

## 2021-12-31 ENCOUNTER — Ambulatory Visit (INDEPENDENT_AMBULATORY_CARE_PROVIDER_SITE_OTHER): Payer: Medicaid Other | Admitting: Clinical

## 2021-12-31 DIAGNOSIS — F411 Generalized anxiety disorder: Secondary | ICD-10-CM | POA: Diagnosis not present

## 2021-12-31 DIAGNOSIS — F321 Major depressive disorder, single episode, moderate: Secondary | ICD-10-CM | POA: Diagnosis not present
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
## 2022-01-03 DIAGNOSIS — I10 Essential (primary) hypertension: Secondary | ICD-10-CM | POA: Diagnosis not present

## 2022-01-03 DIAGNOSIS — J439 Emphysema, unspecified: Secondary | ICD-10-CM | POA: Diagnosis not present

## 2022-01-03 DIAGNOSIS — J029 Acute pharyngitis, unspecified: Secondary | ICD-10-CM | POA: Diagnosis not present

## 2022-01-03 DIAGNOSIS — Z794 Long term (current) use of insulin: Secondary | ICD-10-CM | POA: Diagnosis not present

## 2022-01-03 DIAGNOSIS — Z888 Allergy status to other drugs, medicaments and biological substances status: Secondary | ICD-10-CM | POA: Diagnosis not present

## 2022-01-03 DIAGNOSIS — B338 Other specified viral diseases: Secondary | ICD-10-CM | POA: Diagnosis not present

## 2022-01-03 DIAGNOSIS — E119 Type 2 diabetes mellitus without complications: Secondary | ICD-10-CM | POA: Diagnosis not present

## 2022-01-03 DIAGNOSIS — R059 Cough, unspecified: Secondary | ICD-10-CM | POA: Diagnosis not present

## 2022-01-03 DIAGNOSIS — Z7984 Long term (current) use of oral hypoglycemic drugs: Secondary | ICD-10-CM | POA: Diagnosis not present

## 2022-01-03 DIAGNOSIS — J441 Chronic obstructive pulmonary disease with (acute) exacerbation: Secondary | ICD-10-CM | POA: Diagnosis not present

## 2022-01-03 DIAGNOSIS — Z20822 Contact with and (suspected) exposure to covid-19: Secondary | ICD-10-CM | POA: Diagnosis not present

## 2022-01-03 DIAGNOSIS — Z1152 Encounter for screening for COVID-19: Secondary | ICD-10-CM | POA: Diagnosis not present

## 2022-01-03 DIAGNOSIS — B974 Respiratory syncytial virus as the cause of diseases classified elsewhere: Secondary | ICD-10-CM | POA: Diagnosis not present

## 2022-01-11 DIAGNOSIS — Z79899 Other long term (current) drug therapy: Secondary | ICD-10-CM | POA: Diagnosis not present

## 2022-01-11 DIAGNOSIS — R7309 Other abnormal glucose: Secondary | ICD-10-CM | POA: Diagnosis not present

## 2022-01-11 DIAGNOSIS — M545 Low back pain, unspecified: Secondary | ICD-10-CM | POA: Diagnosis not present

## 2022-01-11 DIAGNOSIS — K5909 Other constipation: Secondary | ICD-10-CM | POA: Diagnosis not present

## 2022-01-16 DIAGNOSIS — J329 Chronic sinusitis, unspecified: Secondary | ICD-10-CM | POA: Diagnosis not present

## 2022-01-16 DIAGNOSIS — B9689 Other specified bacterial agents as the cause of diseases classified elsewhere: Secondary | ICD-10-CM | POA: Diagnosis not present

## 2022-01-17 DIAGNOSIS — Z79899 Other long term (current) drug therapy: Secondary | ICD-10-CM | POA: Diagnosis not present

## 2022-01-21 ENCOUNTER — Ambulatory Visit (INDEPENDENT_AMBULATORY_CARE_PROVIDER_SITE_OTHER): Payer: Medicaid Other | Admitting: Clinical

## 2022-01-21 DIAGNOSIS — F321 Major depressive disorder, single episode, moderate: Secondary | ICD-10-CM | POA: Diagnosis not present

## 2022-01-21 DIAGNOSIS — F411 Generalized anxiety disorder: Secondary | ICD-10-CM
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
## 2022-01-23 DIAGNOSIS — Z419 Encounter for procedure for purposes other than remedying health state, unspecified: Secondary | ICD-10-CM | POA: Diagnosis not present

## 2022-01-24 ENCOUNTER — Telehealth: Payer: Self-pay | Admitting: Gastroenterology

## 2022-01-24 NOTE — Telephone Encounter (Signed)
Patient left a message at our office asking for a prescription to be called in for Linzess (sp?).... she didn't leave any other information.

## 2022-01-25 ENCOUNTER — Other Ambulatory Visit (INDEPENDENT_AMBULATORY_CARE_PROVIDER_SITE_OTHER): Payer: Self-pay | Admitting: Gastroenterology

## 2022-01-25 DIAGNOSIS — K581 Irritable bowel syndrome with constipation: Secondary | ICD-10-CM

## 2022-01-25 MED ORDER — LINACLOTIDE 145 MCG PO CAPS: 145.0000 ug | ORAL_CAPSULE | Freq: Every day | ORAL | 3 refills | Status: DC

## 2022-01-25 NOTE — Telephone Encounter (Signed)
Linzess refilled

## 2022-01-25 NOTE — Telephone Encounter (Signed)
I called to ask if she was still on as it is not on her medication list.It was mentioned in last office visit in June that patient to continue Linzess 145. Need to confirm patient is still taking 145 of Linzess  and what pharmacy then need it sent at. I left a message that we would be closing office today at 11:30 am.

## 2022-01-28 ENCOUNTER — Ambulatory Visit: Payer: Medicaid Other | Admitting: Family Medicine

## 2022-02-04 ENCOUNTER — Telehealth (HOSPITAL_COMMUNITY): Payer: Medicaid Other | Admitting: Psychiatry

## 2022-02-06 ENCOUNTER — Telehealth (INDEPENDENT_AMBULATORY_CARE_PROVIDER_SITE_OTHER): Payer: Medicaid Other | Admitting: Psychiatry

## 2022-02-06 ENCOUNTER — Encounter (HOSPITAL_COMMUNITY): Payer: Self-pay | Admitting: Psychiatry

## 2022-02-06 DIAGNOSIS — F411 Generalized anxiety disorder: Secondary | ICD-10-CM | POA: Diagnosis not present

## 2022-02-06 DIAGNOSIS — F321 Major depressive disorder, single episode, moderate: Secondary | ICD-10-CM | POA: Diagnosis not present

## 2022-02-06 MED ORDER — VENLAFAXINE HCL ER 150 MG PO CP24
300.0000 mg | ORAL_CAPSULE | Freq: Every day | ORAL | 2 refills | Status: DC
Start: 2022-02-06 — End: 2022-05-09

## 2022-02-06 MED ORDER — ALPRAZOLAM 1 MG PO TABS: 1.0000 mg | ORAL_TABLET | Freq: Every evening | ORAL | 2 refills | Status: DC | PRN

## 2022-02-06 MED ORDER — ARIPIPRAZOLE 2 MG PO TABS: 2.0000 mg | ORAL_TABLET | Freq: Every day | ORAL | 2 refills | Status: DC
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
## 2022-02-07 ENCOUNTER — Encounter: Payer: Self-pay | Admitting: Family Medicine

## 2022-02-07 ENCOUNTER — Ambulatory Visit: Payer: Medicaid Other | Admitting: Family Medicine

## 2022-02-07 VITALS — BP 107/67 | HR 83 | Temp 98.0°F | Ht 59.0 in | Wt 130.0 lb

## 2022-02-07 DIAGNOSIS — K219 Gastro-esophageal reflux disease without esophagitis: Secondary | ICD-10-CM

## 2022-02-07 DIAGNOSIS — Z79891 Long term (current) use of opiate analgesic: Secondary | ICD-10-CM | POA: Diagnosis not present

## 2022-02-07 DIAGNOSIS — Z23 Encounter for immunization: Secondary | ICD-10-CM

## 2022-02-07 DIAGNOSIS — K589 Irritable bowel syndrome without diarrhea: Secondary | ICD-10-CM

## 2022-02-07 DIAGNOSIS — E1165 Type 2 diabetes mellitus with hyperglycemia: Secondary | ICD-10-CM | POA: Diagnosis not present

## 2022-02-07 DIAGNOSIS — G8929 Other chronic pain: Secondary | ICD-10-CM

## 2022-02-07 DIAGNOSIS — E785 Hyperlipidemia, unspecified: Secondary | ICD-10-CM | POA: Diagnosis not present

## 2022-02-07 DIAGNOSIS — M545 Low back pain, unspecified: Secondary | ICD-10-CM

## 2022-02-07 DIAGNOSIS — E1159 Type 2 diabetes mellitus with other circulatory complications: Secondary | ICD-10-CM

## 2022-02-07 DIAGNOSIS — E1142 Type 2 diabetes mellitus with diabetic polyneuropathy: Secondary | ICD-10-CM | POA: Diagnosis not present

## 2022-02-07 DIAGNOSIS — E1169 Type 2 diabetes mellitus with other specified complication: Secondary | ICD-10-CM

## 2022-02-07 DIAGNOSIS — I152 Hypertension secondary to endocrine disorders: Secondary | ICD-10-CM

## 2022-02-07 LAB — CMP14+EGFR
ALT: 10 IU/L (ref 0–32)
AST: 16 IU/L (ref 0–40)
Albumin/Globulin Ratio: 1.6 (ref 1.2–2.2)
Albumin: 4 g/dL (ref 3.9–4.9)
Alkaline Phosphatase: 60 IU/L (ref 44–121)
BUN/Creatinine Ratio: 24 (ref 12–28)
BUN: 16 mg/dL (ref 8–27)
Bilirubin Total: 0.2 mg/dL (ref 0.0–1.2)
CO2: 23 mmol/L (ref 20–29)
Calcium: 9.2 mg/dL (ref 8.7–10.3)
Chloride: 101 mmol/L (ref 96–106)
Creatinine, Ser: 0.66 mg/dL (ref 0.57–1.00)
Globulin, Total: 2.5 g/dL (ref 1.5–4.5)
Glucose: 107 mg/dL — ABNORMAL HIGH (ref 70–99)
Potassium: 4.4 mmol/L (ref 3.5–5.2)
Sodium: 139 mmol/L (ref 134–144)
Total Protein: 6.5 g/dL (ref 6.0–8.5)
eGFR: 99 mL/min/{1.73_m2} (ref >59)

## 2022-02-07 LAB — LIPID PANEL
Chol/HDL Ratio: 2.3 ratio (ref 0.0–4.4)
Cholesterol, Total: 162 mg/dL (ref 100–199)
HDL: 70 mg/dL (ref >39)
LDL Chol Calc (NIH): 77 mg/dL (ref 0–99)
Triglycerides: 81 mg/dL (ref 0–149)
VLDL Cholesterol Cal: 15 mg/dL (ref 5–40)

## 2022-02-07 LAB — CBC WITH DIFFERENTIAL/PLATELET
Basophils Absolute: 0.1 10*3/uL (ref 0.0–0.2)
Basos: 1 %
EOS (ABSOLUTE): 0.1 10*3/uL (ref 0.0–0.4)
Eos: 3 %
Hematocrit: 36.9 % (ref 34.0–46.6)
Hemoglobin: 11.9 g/dL (ref 11.1–15.9)
Immature Grans (Abs): 0 10*3/uL (ref 0.0–0.1)
Immature Granulocytes: 0 %
Lymphocytes Absolute: 1.4 10*3/uL (ref 0.7–3.1)
Lymphs: 32 %
MCH: 28.3 pg (ref 26.6–33.0)
MCHC: 32.2 g/dL (ref 31.5–35.7)
MCV: 88 fL (ref 79–97)
Monocytes Absolute: 0.4 10*3/uL (ref 0.1–0.9)
Monocytes: 9 %
Neutrophils Absolute: 2.5 10*3/uL (ref 1.4–7.0)
Neutrophils: 55 %
Platelets: 239 10*3/uL (ref 150–450)
RBC: 4.2 x10E6/uL (ref 3.77–5.28)
RDW: 12.6 % (ref 11.7–15.4)
WBC: 4.5 10*3/uL (ref 3.4–10.8)

## 2022-02-07 LAB — BAYER DCA HB A1C WAIVED: HB A1C (BAYER DCA - WAIVED): 7.1 % — ABNORMAL HIGH (ref 4.8–5.6)

## 2022-02-07 MED ORDER — EMPAGLIFLOZIN 25 MG PO TABS: 25.0000 mg | ORAL_TABLET | Freq: Every day | ORAL | 3 refills | Status: DC

## 2022-02-07 NOTE — Progress Notes (Signed)
Established Patient Office Visit  Subjective   Patient ID: Charlotte Perez, female    DOB: 01/10/1961  Age: 61 y.o. MRN: [id number]  Chief Complaint  Patient presents with   New Patient (Initial Visit)   Diabetes   Hyperlipidemia    Diabetes She presents for her follow-up diabetic visit. She has type 2 diabetes mellitus. Her disease course has been stable. Pertinent negatives for hypoglycemia include no headaches or sweats. Associated symptoms include foot paresthesias (neuropathy). Pertinent negatives for diabetes include no blurred vision and no chest pain. Diabetic complications include peripheral neuropathy. Current diabetic treatment includes oral agent (dual therapy) (jardiance, metformin). Meal planning includes avoidance of concentrated sweets. She participates in exercise weekly. An ACE inhibitor/angiotensin II receptor blocker is being taken. Sees podiatrist: has been referred.Eye exam is current.  Hyperlipidemia Pertinent negatives include no chest pain, focal sensory loss, focal weakness, leg pain, myalgias or shortness of breath. Current antihyperlipidemic treatment includes statins. Compliance problems include adherence to diet and adherence to exercise.   Hypertension This is a chronic problem. The problem is controlled. Pertinent negatives include no blurred vision, chest pain, headaches, malaise/fatigue, neck pain, orthopnea, palpitations, peripheral edema, PND, shortness of breath or sweats. Past treatments include ACE inhibitors. Compliance problems include exercise and diet.    GERD Reports GERD is well controlled with omeprazole.   IBS See GI. On bentyl  Chronic Pain She sees pain management for chronic pain. She is on long term opiate medication for this. She also has neuropathy and takes lyrica for this.     Review of Systems  Constitutional:  Negative for malaise/fatigue.  Eyes:  Negative for blurred vision.  Respiratory:  Negative for shortness of breath.    Cardiovascular:  Negative for chest pain, palpitations, orthopnea and PND.  Musculoskeletal:  Negative for myalgias and neck pain.  Neurological:  Negative for focal weakness and headaches.      Objective:     BP 107/67   Pulse 83   Temp 98 F (36.7 C) (Temporal)   Ht 4' 11" (1.499 m)   Wt 130 lb (59 kg)   SpO2 97%   BMI 26.26 kg/m    Physical Exam Vitals and nursing note reviewed.  Constitutional:      General: She is not in acute distress.    Appearance: She is not ill-appearing, toxic-appearing or diaphoretic.  HENT:     Head: Normocephalic and atraumatic.     Nose: Nose normal.     Mouth/Throat:     Mouth: Mucous membranes are moist.     Pharynx: Oropharynx is clear.  Cardiovascular:     Rate and Rhythm: Normal rate and regular rhythm.     Heart sounds: Normal heart sounds. No murmur heard. Pulmonary:     Effort: Pulmonary effort is normal. No respiratory distress.     Breath sounds: Normal breath sounds.  Abdominal:     General: Bowel sounds are normal.     Palpations: Abdomen is soft.  Musculoskeletal:     Cervical back: Neck supple. No rigidity.     Right lower leg: No edema.     Left lower leg: No edema.  Skin:    General: Skin is warm and dry.  Neurological:     General: No focal deficit present.     Mental Status: She is alert and oriented to person, place, and time.  Psychiatric:        Mood and Affect: Mood normal.  Behavior: Behavior normal.        Thought Content: Thought content normal.        Judgment: Judgment normal.     No results found for any visits on 02/07/22.    The 10-year ASCVD risk score (Arnett DK, et al., 2019) is: 5%    Assessment & Plan:   Jamarria was seen today for new patient (initial visit), diabetes and hyperlipidemia.  Diagnoses and all orders for this visit:  Type 2 diabetes mellitus with hyperglycemia, without long-term current use of insulin (HCC) A1c is pending. Continue metformin and jardiance.  Foot exam today. Has been referred to podiatry. Eye exam is UTD. Labs pending. On ACE and statin.  -     CBC with Differential/Platelet -     CMP14+EGFR -     Lipid panel -     Microalbumin / creatinine urine ratio -     Bayer DCA Hb A1c Waived -     empagliflozin (JARDIANCE) 25 MG TABS tablet; Take 1 tablet (25 mg total) by mouth daily.  Diabetic peripheral neuropathy ([place]) On lyrica. Discussed our office's CSA policy. She will discuss this with her pain management provider to see if they will also take over prescribing lyrica for her. If not, I will take over this with a signed CSA and UDS.   Hyperlipidemia associated with type 2 diabetes mellitus ([place]) On statin. Labs pending.  -     CBC with Differential/Platelet -     CMP14+EGFR -     Lipid panel  Hypertension associated with diabetes ([place]) Well controlled on current regimen. Continue lisinopril.  -     CBC with Differential/Platelet -     CMP14+EGFR -     Lipid panel  Irritable bowel syndrome, unspecified type Managed by GI.   Gastroesophageal reflux disease without esophagitis Well controlled on current regimen. Continue omeprazole.  -     CBC with Differential/Platelet -     CMP14+EGFR  Chronic midline low back pain without sciatica Chronic prescription opiate use Managed by pain management  Need for immunization against influenza Flu vaccine today.   Return in about 3 months (around 05/10/2022) for chronic follow up.   The patient indicates understanding of these issues and agrees with the plan.  Gwenlyn Perking, FNP

## 2022-02-07 NOTE — Patient Instructions (Signed)

## 2022-02-08 ENCOUNTER — Other Ambulatory Visit: Payer: Self-pay | Admitting: Family Medicine

## 2022-02-08 DIAGNOSIS — E1165 Type 2 diabetes mellitus with hyperglycemia: Secondary | ICD-10-CM

## 2022-02-08 MED ORDER — SITAGLIPTIN PHOSPHATE 25 MG PO TABS: 25.0000 mg | ORAL_TABLET | Freq: Every day | ORAL | 1 refills | Status: DC

## 2022-02-09 LAB — MICROALBUMIN / CREATININE URINE RATIO
Creatinine, Urine: 52.1 mg/dL
Microalb/Creat Ratio: 9 mg/g creat (ref 0–29)
Microalbumin, Urine: 4.8 ug/mL

## 2022-02-11 ENCOUNTER — Ambulatory Visit (INDEPENDENT_AMBULATORY_CARE_PROVIDER_SITE_OTHER): Payer: Medicaid Other | Admitting: Clinical

## 2022-02-11 DIAGNOSIS — F321 Major depressive disorder, single episode, moderate: Secondary | ICD-10-CM

## 2022-02-11 DIAGNOSIS — F411 Generalized anxiety disorder: Secondary | ICD-10-CM

## 2022-02-11 NOTE — Progress Notes (Signed)
VIRTUAL TELEPHONE   I connected with Charlotte Perez on 02/11/22 at  8:00 AM EDT by telephone and verified that I am speaking with the correct person using two identifiers.   Location: Patient: Home Provider: Office   I discussed the limitations, risks, security and privacy concerns of performing an evaluation and management service by telephone and the availability of in person appointments. I also discussed with the patient that there may be a patient responsible charge related to this service. The patient expressed understanding and agreed to proceed.   THERAPIST PROGRESS NOTE   Session Time: 8:00 AM-8:30 AM   Participation Level: Active   Behavioral Response: CasualAlertDepressed   Type of Therapy: Individual Therapy   Treatment Goals addressed: Coping   Interventions: CBT, DBT, Solution Focused, Strength-based and Supportive   Summary: Charlotte Fesler. Perez is a 61 y.o. female who presents with Depression and GAD. The OPT therapist worked with the patient for her OPT treatment. The OPT therapist utilized Motivational Interviewing to assist in creating therapeutic repore. The patient in the session was engaged and work in collaboration giving feedback post her from RSV. The patient spoke about continuing to work towards no interactions with her ex as this has been a trigger for the patient post her leaving her ex. The OPT therapist utilized Cognitive Behavioral Therapy through cognitive restructuring as well as worked with the patient on coping strategies to assist in management of mood including ongoing motivation and empowerment for the patient to be as active as possible with her health limitations. The patient spoke about her recent health visits and continuing to work on lowering her A1C. The patient spoke about ongoing  adjusting to living with her sister after moving out of her situation with her partner being a protective factor and enjoying being more social and spending more time  with family members. The patient spoke about her plans for the upcoming Thanksgiving holiday. The patient did recently get her Flu shot with her pain management provider. The OPT therapist continued work with the patient on coping strategies and her work on writing this next chapter moving forward in her life. The OPT therapist overviewed all upcoming appointments from the patient [name].   Suicidal/Homicidal: Nowithout intent/plan   Therapist Response: The OPT therapist worked with the patient for the patients scheduled session. The patient was engaged in her session and gave feedback in relation to triggers, symptoms, and behavior responses over the past few weeks.The OPT therapist worked with the patient utilizing an in session Cognitive Behavioral Therapy exercise. The patient was responsive in the session and verbalized, " I just got into it with my ex last week when I went to take him his mail and again it just ended in a argument and I threw my hands in the air and said I am done and I have not talked with him since and I am not going back over there and I am not accepting his calls! ". The OPT therapist worked with the patient on limiting her interactions with known triggers (most notably her ex-partner), and utilizing her support network. The patient verbalized improvement with her mood and identified her medication and change in moving in with her sister have been enormously beneficial. The patient verbalized no S/I or H/I and spoke about her work towards consistency and stability. The OPT therapist will continue treatment work with the patient in her next scheduled session.      Plan: Return again in 2 weeks.   Diagnosis:  Axis I: Moderate single episode of Major Depressive Disorder / GAD                           Axis II: No diagnosis     Collaboration of Care: No additional collaboration of care for this session   Patient/Guardian was advised Release of Information must be obtained  prior to any record release in order to collaborate their care with an outside provider. Patient/Guardian was advised if they have not already done so to contact the registration department to sign all necessary forms in order for [place] to release information regarding their care.    Consent: Patient/Guardian gives verbal consent for treatment and assignment of benefits for services provided during this visit. Patient/Guardian expressed understanding and agreed to proceed.    I discussed the assessment and treatment plan with the patient. The patient was provided an opportunity to ask questions and all were answered. The patient agreed with the plan and demonstrated an understanding of the instructions.   The patient was advised to call back or seek an in-person evaluation if the symptoms worsen or if the condition fails to improve as anticipated.   I provided 30 minutes of non-face-to-face time during this encounter.   Lennox Grumbles, LCSW   02/11/2022

## 2022-02-13 ENCOUNTER — Telehealth: Payer: Self-pay | Admitting: Family Medicine

## 2022-02-13 DIAGNOSIS — E1142 Type 2 diabetes mellitus with diabetic polyneuropathy: Secondary | ICD-10-CM

## 2022-02-13 DIAGNOSIS — M545 Low back pain, unspecified: Secondary | ICD-10-CM | POA: Diagnosis not present

## 2022-02-13 DIAGNOSIS — E213 Hyperparathyroidism, unspecified: Secondary | ICD-10-CM | POA: Diagnosis not present

## 2022-02-13 DIAGNOSIS — Z79899 Other long term (current) drug therapy: Secondary | ICD-10-CM | POA: Diagnosis not present

## 2022-02-13 DIAGNOSIS — E1165 Type 2 diabetes mellitus with hyperglycemia: Secondary | ICD-10-CM

## 2022-02-13 NOTE — Telephone Encounter (Signed)
Referral to podiatry placed

## 2022-02-13 NOTE — Telephone Encounter (Signed)
Reason for Referral: issues with feet   Has the referral been discussed with the patient?: Patient said it was discussed 11/16  Designated contact for the referral if not the patient (name/phone number):   Has the patient seen a specialist for this issue before?: no  If so, who (practice/provider)?  Does the patient have a provider or location preference for the referral?: no Would the patient like to see previous specialist if applicable?

## 2022-02-13 NOTE — Telephone Encounter (Signed)
Patient aware.

## 2022-02-22 ENCOUNTER — Ambulatory Visit (INDEPENDENT_AMBULATORY_CARE_PROVIDER_SITE_OTHER): Payer: Medicaid Other | Admitting: Nurse Practitioner

## 2022-02-22 ENCOUNTER — Encounter: Payer: Self-pay | Admitting: Nurse Practitioner

## 2022-02-22 DIAGNOSIS — R051 Acute cough: Secondary | ICD-10-CM | POA: Diagnosis not present

## 2022-02-22 DIAGNOSIS — J0110 Acute frontal sinusitis, unspecified: Secondary | ICD-10-CM | POA: Diagnosis not present

## 2022-02-22 DIAGNOSIS — Z419 Encounter for procedure for purposes other than remedying health state, unspecified: Secondary | ICD-10-CM | POA: Diagnosis not present

## 2022-02-22 MED ORDER — FLUTICASONE PROPIONATE 50 MCG/ACT NA SUSP: 2.0000 | Freq: Every day | NASAL | 2 refills | Status: DC

## 2022-02-22 MED ORDER — AMOXICILLIN-POT CLAVULANATE 875-125 MG PO TABS: 1.0000 | ORAL_TABLET | Freq: Two times a day (BID) | ORAL | 0 refills | Status: DC

## 2022-02-22 NOTE — Progress Notes (Signed)
   Virtual Visit  Note Due to COVID-19 pandemic this visit was conducted virtually. This visit type was conducted due to national recommendations for restrictions regarding the COVID-19 Pandemic (e.g. social distancing, sheltering in place) in an effort to limit this patient's exposure and mitigate transmission in our community. All issues noted in this document were discussed and addressed.  A physical exam was not performed with this format.  I connected with Charlotte Perez on 02/22/22 at 11:40 am  by telephone and verified that I am speaking with the correct person using two identifiers. Charlotte Perez is currently located at home during visit. The provider, Ivy Lynn, NP is located in their office at time of visit.  I discussed the limitations, risks, security and privacy concerns of performing an evaluation and management service by telephone and the availability of in person appointments. I also discussed with the patient that there may be a patient responsible charge related to this service. The patient expressed understanding and agreed to proceed.   History and Present Illness:  Sinusitis Associated symptoms include coughing and headaches. Pertinent negatives include no chills, ear pain, shortness of breath or sore throat.  Cough The current episode started yesterday. The problem has been unchanged. The problem occurs constantly. The cough is Non-productive. Associated symptoms include headaches, nasal congestion and postnasal drip. Pertinent negatives include no chills, ear congestion, ear pain, fever, sore throat or shortness of breath. Nothing aggravates the symptoms. She has tried OTC cough suppressant for the symptoms. The treatment provided no relief.      Review of Systems  Constitutional:  Negative for chills and fever.  HENT:  Positive for postnasal drip. Negative for ear pain and sore throat.   Respiratory:  Positive for cough. Negative for shortness of breath.    Neurological:  Positive for headaches.     Observations/Objective: Tele-visit patient is not in distress  Assessment and Plan: Patient presents with URI in infection and sinus pressure. Symptoms are classic for sinusitis.  Take meds as prescribed - Use a cool mist humidifier  -Use saline nose sprays frequently -Force fluids -For fever or aches or pains- take Tylenol or ibuprofen. -If symptoms do not improve, she may need to be COVID tested to rule this out   Follow Up Instructions: Follow up with unresolved symptoms    I discussed the assessment and treatment plan with the patient. The patient was provided an opportunity to ask questions and all were answered. The patient agreed with the plan and demonstrated an understanding of the instructions.   The patient was advised to call back or seek an in-person evaluation if the symptoms worsen or if the condition fails to improve as anticipated.  The above assessment and management plan was discussed with the patient. The patient verbalized understanding of and has agreed to the management plan. Patient is aware to call the clinic if symptoms persist or worsen. Patient is aware when to return to the clinic for a follow-up visit. Patient educated on when it is appropriate to go to the emergency department.   Time call ended:  10: 53 am   I provided 13 minutes of  non face-to-face time during this encounter.    Ivy Lynn, NP

## 2022-02-22 NOTE — Patient Instructions (Signed)

## 2022-02-28 ENCOUNTER — Ambulatory Visit: Payer: Medicaid Other | Admitting: Podiatry

## 2022-02-28 VITALS — BP 120/73 | HR 87

## 2022-02-28 DIAGNOSIS — M7742 Metatarsalgia, left foot: Secondary | ICD-10-CM | POA: Diagnosis not present

## 2022-02-28 DIAGNOSIS — M7741 Metatarsalgia, right foot: Secondary | ICD-10-CM

## 2022-02-28 DIAGNOSIS — I739 Peripheral vascular disease, unspecified: Secondary | ICD-10-CM | POA: Diagnosis not present

## 2022-02-28 DIAGNOSIS — G629 Polyneuropathy, unspecified: Secondary | ICD-10-CM | POA: Diagnosis not present

## 2022-02-28 DIAGNOSIS — E119 Type 2 diabetes mellitus without complications: Secondary | ICD-10-CM

## 2022-02-28 MED ORDER — PREGABALIN 50 MG PO CAPS
ORAL_CAPSULE | ORAL | 1 refills | Status: DC
Start: 2022-02-28 — End: 2022-04-09

## 2022-02-28 NOTE — Patient Instructions (Signed)
Pregabalin Capsules What is this medication? PREGABALIN (pre GAB a lin) treats nerve pain. It may also be used to prevent and control seizures in people with epilepsy. It works by calming overactive nerves in your body. This medicine may be used for other purposes; ask your health care provider or pharmacist if you have questions. COMMON BRAND NAME(S): Lyrica What should I tell my care team before I take this medication? They need to know if you have any of these conditions: Heart failure Kidney disease Lung disease Substance use disorder Suicidal thoughts, plans or attempt by you or a family member An unusual or allergic reaction to pregabalin, other medications, foods, dyes, or preservatives Pregnant or trying to get pregnant Breast-feeding How should I use this medication? Take this medication by mouth with water. Take it as directed on the prescription label at the same time every day. You can take it with or without food. If it upsets your stomach, take it with food. Keep taking it unless your care team tells you to stop. A special MedGuide will be given to you by the pharmacist with each prescription and refill. Be sure to read this information carefully each time. Talk to your care team about the use of this medication in children. While it may be prescribed for children as young as 1 month for selected conditions, precautions do apply. Overdosage: If you think you have taken too much of this medicine contact a poison control center or emergency room at once. NOTE: This medicine is only for you. Do not share this medicine with others. What if I miss a dose? If you miss a dose, take it as soon as you can. If it is almost time for your next dose, take only that dose. Do not take double or extra doses. What may interact with this medication? This medication may interact with the following: Alcohol Antihistamines for allergy, cough, and cold Certain medications for anxiety or  sleep Certain medications for blood pressure, heart disease Certain medications for depression like amitriptyline, fluoxetine, sertraline Certain medications for diabetes, like pioglitazone, rosiglitazone Certain medications for seizures like phenobarbital, primidone General anesthetics like halothane, isoflurane, methoxyflurane, propofol Medications that relax muscles for surgery Opioid medications for pain Phenothiazines like chlorpromazine, mesoridazine, prochlorperazine, thioridazine This list may not describe all possible interactions. Give your health care provider a list of all the medicines, herbs, non-prescription drugs, or dietary supplements you use. Also tell them if you smoke, drink alcohol, or use illegal drugs. Some items may interact with your medicine. What should I watch for while using this medication? Visit your care team for regular checks on your progress. Tell your care team if your symptoms do not start to get better or if they get worse. Do not suddenly stop taking this medication. You may develop a severe reaction. Your care team will tell you how much medication to take. If your care team wants you to stop the medication, the dose may be slowly lowered over time to avoid any side effects. This medication may affect your coordination, reaction time, or judgment. Do not drive or operate machinery until you know how this medication affects you. Sit up or stand slowly to reduce the risk of dizzy or fainting spells. Drinking alcohol with this medication can increase the risk of these side effects. If you or your family notice any changes in your behavior, such as new or worsening depression, thoughts of harming yourself, anxiety, other unusual or disturbing thoughts, or memory loss, call your  care team right away. Wear a medical ID bracelet or chain if you are taking this medication for seizures. Carry a card that describes your condition. List the medications and doses you take  on the card. This medication may make it more difficult to father a child. Talk to your care team if you are concerned about your fertility. What side effects may I notice from receiving this medication? Side effects that you should report to your care team as soon as possible: Allergic reactions or angioedema--skin rash, itching, hives, swelling of the face, eyes, lips, tongue, arms, or legs, trouble swallowing or breathing Blurry vision Thoughts of suicide or self-harm, worsening mood, feelings of depression Trouble breathing Side effects that usually do not require medical attention (report to your care team if they continue or are bothersome): Dizziness Drowsiness Dry mouth Nausea Swelling of the ankles, feet, hands Vomiting Weight gain This list may not describe all possible side effects. Call your doctor for medical advice about side effects. You may report side effects to FDA at 1-800-FDA-1088. Where should I keep my medication? Keep out of the reach of children and pets. This medication can be abused. Keep it in a safe place to protect it from theft. Do not share it with anyone. It is only for you. Selling or giving away this medication is dangerous and against the law. Store at [employer] [address] (77 degrees F). Get rid of any unused medication after the expiration date. This medication may cause harm and death if it is taken by other adults, children, or pets. It is important to get rid of the medication as soon as you no longer need it, or it is expired. You can do this in two ways: Take the medication to a medication take-back program. Check with your pharmacy or law enforcement to find a location. If you cannot return the medication, check the label or package insert to see if the medication should be thrown out in the garbage or flushed down the toilet. If you are not sure, ask your care team. If it is safe to put it in the trash, take the medication out of the container. Mix the  medication with cat litter, dirt, coffee grounds, or other unwanted substance. Seal the mixture in a bag or container. Put it in the trash. NOTE: This sheet is a summary. It may not cover all possible information. If you have questions about this medicine, talk to your doctor, pharmacist, or health care provider.  2023 Elsevier/Gold Standard (2020-03-07 00:00:00) Diabetes Mellitus and Foot Care Diabetes, also called diabetes mellitus, may cause problems with your feet and legs because of poor blood flow (circulation). Poor circulation may make your skin: Become thinner and drier. Break more easily. Heal more slowly. Peel and crack. You may also have nerve damage (neuropathy). This can cause decreased feeling in your legs and feet. This means that you may not notice minor injuries to your feet that could lead to more serious problems. Finding and treating problems early is the best way to prevent future foot problems. How to care for your feet Foot hygiene  Wash your feet daily with warm water and mild soap. Do not use hot water. Then, pat your feet and the areas between your toes until they are fully dry. Do not soak your feet. This can dry your skin. Trim your toenails straight across. Do not dig under them or around the cuticle. File the edges of your nails with an emery board or  nail file. Apply a moisturizing lotion or petroleum jelly to the skin on your feet and to dry, brittle toenails. Use lotion that does not contain alcohol and is unscented. Do not apply lotion between your toes. Shoes and socks Wear clean socks or stockings every day. Make sure they are not too tight. Do not wear knee-high stockings. These may decrease blood flow to your legs. Wear shoes that fit well and have enough cushioning. Always look in your shoes before you put them on to be sure there are no objects inside. To break in new shoes, wear them for just a few hours a day. This prevents injuries on your  feet. Wounds, scrapes, corns, and calluses  Check your feet daily for blisters, cuts, bruises, sores, and redness. If you cannot see the bottom of your feet, use a mirror or ask someone for help. Do not cut off corns or calluses or try to remove them with medicine. If you find a minor scrape, cut, or break in the skin on your feet, keep it and the skin around it clean and dry. You may clean these areas with mild soap and water. Do not clean the area with peroxide, alcohol, or iodine. If you have a wound, scrape, corn, or callus on your foot, look at it several times a day to make sure it is healing and not infected. Check for: Redness, swelling, or pain. Fluid or blood. Warmth. Pus or a bad smell. General tips Do not cross your legs. This may decrease blood flow to your feet. Do not use heating pads or hot water bottles on your feet. They may burn your skin. If you have lost feeling in your feet or legs, you may not know this is happening until it is too late. Protect your feet from hot and cold by wearing shoes, such as at the beach or on hot pavement. Schedule a complete foot exam at least once a year or more often if you have foot problems. Report any cuts, sores, or bruises to your health care provider right away. Where to find more information American Diabetes Association: diabetes.org Association of Diabetes Care & Education Specialists: diabeteseducator.org Contact a health care provider if: You have a condition that increases your risk of infection, and you have any cuts, sores, or bruises on your feet. You have an injury that is not healing. You have redness on your legs or feet. You feel burning or tingling in your legs or feet. You have pain or cramps in your legs and feet. Your legs or feet are numb. Your feet always feel cold. You have pain around any toenails. Get help right away if: You have a wound, scrape, corn, or callus on your foot and: You have signs of  infection. You have a fever. You have a red line going up your leg. This information is not intended to replace advice given to you by your health care provider. Make sure you discuss any questions you have with your health care provider. Document Revised: 09/12/2021 Document Reviewed: 09/12/2021 Elsevier Patient Education  [place].

## 2022-02-28 NOTE — Progress Notes (Signed)
Subjective:   Patient ID: Charlotte Perez, female   DOB: 61 y.o.   MRN: [id number]   HPI Chief Complaint  Patient presents with   Diabetes    Diabetic- A1c- 7.1 BG- 117, patient came in today for burning sting, started 3 years ago, [place]: Lyrica     61 year old female presents the office as above concerns.  She has been on Lyrica '50mg'$  3-4 years now, on the same. Of note she brought in all her medicaitons and this is not in there so she thinks that she may not be taking it currently. The symptoms are constant. It hurts at night. She has to has to hang them off of the side of the bed to help. She gets pain in the calf/toes when walking.   No tobacco use.  A1c- 7.1   Review of Systems  All other systems reviewed and are negative.  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   Arthritis    Asthma    Bipolar disorder (HCC)    Chronic back pain    Chronic neck pain    Constipation 09/05/2016   COPD (chronic obstructive pulmonary disease) (HCC)    DDD (degenerative disc disease), cervical    DDD (degenerative disc disease), lumbar    Depression    Diabetes mellitus ([place]) 06/27/2010   Diabetes mellitus, type II ([place])    Gallbladder sludge    GERD (gastroesophageal reflux disease)    Hyperlipidemia    Hypertension    Iron deficiency anemia 09/05/2016   Lumbar radiculopathy    Mole (skin)    Neuropathy of foot    Renal cyst    Shingles    Skin lesions, generalized    1.2 CM FLAT FAWN COLOR AT THE T10 AREA JUST RIGHT OF SPINE     Past Surgical History:  Procedure Laterality Date   ABDOMINAL HYSTERECTOMY     total hysterectomy   BACK SURGERY     BIOPSY  03/07/2021   Procedure: BIOPSY;  Surgeon: Harvel Quale, MD;  Location: [facility];  Service: Gastroenterology;;   CESAREAN SECTION     x 2   ESOPHAGOGASTRODUODENOSCOPY (EGD) WITH PROPOFOL N/A 03/07/2021   Procedure: ESOPHAGOGASTRODUODENOSCOPY (EGD)  WITH PROPOFOL;  Surgeon: Harvel Quale, MD;  Location: [facility];  Service: Gastroenterology;  Laterality: N/A;  9:15   NECK SURGERY       Current Outpatient Medications:    ACCU-CHEK FASTCLIX LANCETS MISC, EVERY DAY, Disp: 102 each, Rfl: 2   albuterol (PROVENTIL) (2.5 MG/3ML) 0.083% nebulizer solution, Take 3 mLs (2.5 mg total) by nebulization every 6 (six) hours as needed for wheezing or shortness of breath. (Patient not taking: Reported on 02/07/2022), Disp: 75 mL, Rfl: 3   ALPRAZolam (XANAX) 1 MG tablet, Take 1 tablet (1 mg total) by mouth at bedtime as needed., Disp: 30 tablet, Rfl: 2   amoxicillin-clavulanate (AUGMENTIN) 875-125 MG tablet, Take 1 tablet by mouth 2 (two) times daily., Disp: 14 tablet, Rfl: 0   ARIPiprazole (ABILIFY) 2 MG tablet, Take 1 tablet (2 mg total) by mouth daily., Disp: 30 tablet, Rfl: 2   atorvastatin (LIPITOR) 40 MG tablet, TAKE ONE (1) TABLET EACH DAY (Patient taking differently: Take 40 mg by mouth daily.), Disp: 90 tablet, Rfl: 3   cycloSPORINE (RESTASIS) 0.05 % ophthalmic emulsion, Place 1 drop into both eyes at bedtime., Disp: , Rfl:    dicyclomine (BENTYL) 10 MG capsule, Take 10 mg  by mouth 2 times daily at 12 noon and 4 pm., Disp: , Rfl:    empagliflozin (JARDIANCE) 25 MG TABS tablet, Take 1 tablet (25 mg total) by mouth daily., Disp: 90 tablet, Rfl: 3   fluticasone (FLONASE) 50 MCG/ACT nasal spray, Place 2 sprays into both nostrils daily., Disp: 16 g, Rfl: 2   glucose blood (ACCU-CHEK GUIDE) test strip, Use daily Dx E11.9, Disp: 100 each, Rfl: 3   HYDROcodone-acetaminophen (NORCO) 10-325 MG tablet, Take 1 tablet by mouth 3 (three) times daily as needed for pain., Disp: , Rfl:    latanoprost (XALATAN) 0.005 % ophthalmic solution, Place 1 drop into both eyes at bedtime., Disp: , Rfl:    linaclotide (LINZESS) 145 MCG CAPS capsule, Take 1 capsule (145 mcg total) by mouth daily before breakfast., Disp: 90 capsule, Rfl: 3   lisinopril (ZESTRIL) 5  MG tablet, TAKE ONE (1) TABLET EACH DAY (Patient taking differently: Take 2.5 mg by mouth daily. TAKE ONE (1) TABLET EACH DAY), Disp: 90 tablet, Rfl: 0   meclizine (ANTIVERT) 25 MG tablet, Take 50 mg by mouth 2 (two) times daily., Disp: , Rfl:    metFORMIN (GLUCOPHAGE) 1000 MG tablet, TAKE ONE TABLET TWICE A DAY WITH FOOD (Patient taking differently: Take 1,000 mg by mouth 2 (two) times daily with a meal.), Disp: 180 tablet, Rfl: 0   omeprazole (PRILOSEC) 40 MG capsule, TAKE ONE (1) CAPSULE EACH DAY (Patient taking differently: Take 40 mg by mouth daily.), Disp: 90 capsule, Rfl: 1   pregabalin (LYRICA) 50 MG capsule, TAKE ONE (1) CAPSULE THREE (3) TIMES EACH DAY, Disp: 90 capsule, Rfl: 1   sitaGLIPtin (JANUVIA) 25 MG tablet, Take 1 tablet (25 mg total) by mouth daily., Disp: 90 tablet, Rfl: 1   venlafaxine XR (EFFEXOR-XR) 150 MG 24 hr capsule, Take 2 capsules (300 mg total) by mouth at bedtime., Disp: 60 capsule, Rfl: 2  Allergies  Allergen Reactions   Cymbalta [Duloxetine Hcl]     Per pt it started making her mouth tingle and could not taste anything   Gabapentin     Itching, fidgety   Meloxicam Other (See Comments)    Tongue tingling, and lost sense of taste.   Tramadol Other (See Comments)    Tongue tingling, and lost sense of taste.          Objective:  Physical Exam  General: AAO x3, NAD  Dermatological: Nails are mildly hypertrophic, dystrophic and elongated.  No edema, erythema or signs of infection.  No open lesions.  Vascular: DP, pulses 2/4, PT pulses decreased bilaterally.  Cap refill time less than 3 seconds.  There is no pain with calf compression, swelling, warmth, erythema.   Neruologic: Sensation decreased with Semmes Weinstein monofilament.  Musculoskeletal: She has not discomfort submetatarsal area but there is no specific area pinpoint tenderness.  No edema, erythema.  Flexor, extensor tendons appear to be intact.  Muscular strength 5/5 in all groups tested  bilateral.  Gait: Unassisted, Nonantalgic.       Assessment:   61 year old female with neuropathy     Plan:  -Treatment options discussed including all alternatives, risks, and complications -Etiology of symptoms were discussed -Since that she is currently not taking Lyrica.  I will go ahead and restart this for her.  Discussed side effects. -ABI ordered as well to rule out any vascular insufficiency which could be contributing to her symptoms. -Discussed using good arch supports.  This will hopefully help with the mild discomfort that she is  noticing submetatarsal area.  She can use metatarsal pads as well.  Trula Slade DPM

## 2022-03-01 ENCOUNTER — Ambulatory Visit (HOSPITAL_COMMUNITY)
Admission: RE | Admit: 2022-03-01 | Discharge: 2022-03-01 | Disposition: A | Discharge status: Home / Self Care | Payer: Medicaid Other | Source: Ambulatory Visit | Attending: Podiatry | Admitting: Podiatry

## 2022-03-01 DIAGNOSIS — I739 Peripheral vascular disease, unspecified: Secondary | ICD-10-CM

## 2022-03-05 ENCOUNTER — Telehealth: Payer: Self-pay | Admitting: Family Medicine

## 2022-03-06 ENCOUNTER — Ambulatory Visit: Payer: Medicaid Other | Admitting: Family Medicine

## 2022-03-06 ENCOUNTER — Ambulatory Visit (INDEPENDENT_AMBULATORY_CARE_PROVIDER_SITE_OTHER): Payer: Medicaid Other

## 2022-03-06 ENCOUNTER — Encounter: Payer: Self-pay | Admitting: Family Medicine

## 2022-03-06 VITALS — BP 113/72 | HR 86 | Temp 99.9°F | Ht 59.0 in | Wt 130.0 lb

## 2022-03-06 DIAGNOSIS — J441 Chronic obstructive pulmonary disease with (acute) exacerbation: Secondary | ICD-10-CM | POA: Diagnosis not present

## 2022-03-06 DIAGNOSIS — R051 Acute cough: Secondary | ICD-10-CM | POA: Diagnosis not present

## 2022-03-06 DIAGNOSIS — R059 Cough, unspecified: Secondary | ICD-10-CM | POA: Diagnosis not present

## 2022-03-06 DIAGNOSIS — R0602 Shortness of breath: Secondary | ICD-10-CM

## 2022-03-06 DIAGNOSIS — J45901 Unspecified asthma with (acute) exacerbation: Secondary | ICD-10-CM | POA: Diagnosis not present

## 2022-03-06 DIAGNOSIS — J4489 Other specified chronic obstructive pulmonary disease: Secondary | ICD-10-CM

## 2022-03-06 HISTORY — DX: Other specified chronic obstructive pulmonary disease: J44.89

## 2022-03-06 MED ORDER — ALBUTEROL SULFATE (2.5 MG/3ML) 0.083% IN NEBU: 2.5000 mg | INHALATION_SOLUTION | Freq: Four times a day (QID) | RESPIRATORY_TRACT | 3 refills | Status: AC | PRN

## 2022-03-06 MED ORDER — PREDNISONE 20 MG PO TABS: 40.0000 mg | ORAL_TABLET | Freq: Every day | ORAL | 0 refills | Status: AC

## 2022-03-06 MED ORDER — DOXYCYCLINE HYCLATE 100 MG PO TABS: 100.0000 mg | ORAL_TABLET | Freq: Two times a day (BID) | ORAL | 0 refills | Status: AC

## 2022-03-06 NOTE — Patient Instructions (Signed)

## 2022-03-06 NOTE — Progress Notes (Signed)
Acute Office Visit  Subjective:     Patient ID: Charlotte Perez, female    DOB: 12-31-1960, 61 y.o.   MRN: [id number]  Chief Complaint  Patient presents with   Cough    Finished augmentin from 2 wks ago, productive at times green phlegm     Cough This is a new problem. Episode onset: 2 weeks. The problem has been gradually worsening. The problem occurs every few minutes. The cough is Productive of sputum (green, brown). Associated symptoms include nasal congestion, rhinorrhea, shortness of breath (with cough and activity) and wheezing. Pertinent negatives include no chest pain, chills, ear congestion, ear pain, fever, headaches or sore throat. The symptoms are aggravated by exercise. Treatments tried: alka seltzer cold and cough, Tussin DM, augmentin. Her past medical history is significant for asthma and COPD. There is no history of pneumonia.   She has been out of her albuterol solution and her nebulizer machine is not functioning.   Review of Systems  Constitutional:  Negative for chills and fever.  HENT:  Positive for rhinorrhea. Negative for ear pain and sore throat.   Respiratory:  Positive for cough, shortness of breath (with cough and activity) and wheezing.   Cardiovascular:  Negative for chest pain.  Neurological:  Negative for headaches.        Objective:    BP 113/72   Pulse 86   Temp 99.9 F (37.7 C) (Temporal)   Ht '4\' 11"'$  (1.499 m)   Wt 130 lb (59 kg)   SpO2 96%   BMI 26.26 kg/m    Physical Exam Vitals and nursing note reviewed.  Constitutional:      General: She is not in acute distress.    Appearance: She is not ill-appearing, toxic-appearing or diaphoretic.  HENT:     Right Ear: Tympanic membrane, ear canal and external ear normal.     Left Ear: Tympanic membrane, ear canal and external ear normal.     Nose: Nose normal.     Mouth/Throat:     Mouth: Mucous membranes are moist.     Pharynx: Oropharynx is clear.  Cardiovascular:     Rate and  Rhythm: Normal rate and regular rhythm.     Heart sounds: Normal heart sounds. No murmur heard. Pulmonary:     Effort: Pulmonary effort is normal.     Breath sounds: Examination of the right-lower field reveals rhonchi. Examination of the left-lower field reveals rhonchi. Rhonchi present. No rales.  Musculoskeletal:     Cervical back: No rigidity.     Right lower leg: No edema.     Left lower leg: No edema.  Lymphadenopathy:     Cervical: No cervical adenopathy.  Skin:    General: Skin is warm and dry.  Neurological:     General: No focal deficit present.     Mental Status: She is alert and oriented to person, place, and time.  Psychiatric:        Mood and Affect: Mood normal.        Behavior: Behavior normal.        Thought Content: Thought content normal.        Judgment: Judgment normal.     No results found for any visits on 03/06/22.      Assessment & Plan:   [name] was seen today for cough.  Diagnoses and all orders for this visit:  Acute exacerbation of COPD with asthma ([place]) -     albuterol (PROVENTIL) (2.5 MG/3ML) 0.083%  nebulizer solution; Take 3 mLs (2.5 mg total) by nebulization every 6 (six) hours as needed for wheezing or shortness of breath. -     For home use only DME Nebulizer machine -     doxycycline (VIBRA-TABS) 100 MG tablet; Take 1 tablet (100 mg total) by mouth 2 (two) times daily for 7 days. 1 po bid -     predniSONE (DELTASONE) 20 MG tablet; Take 2 tablets (40 mg total) by mouth daily for 5 days.  Acute cough -     DG Chest 2 View; Future  Shortness of breath -     DG Chest 2 View; Future  CXR negative for pneumonia today. Doxycyline and prednisone for COPD exacerbation. Mucinex, hydration. Rx for nebulizer and albuterol prn for shortness of breath and/or wheezing. Strict return precautions given.    Return if symptoms worsen or fail to improve.  The patient indicates understanding of these issues and agrees with the plan.  Gwenlyn Perking, FNP

## 2022-03-07 NOTE — Progress Notes (Signed)
Nebulizer order faxed to [facility]

## 2022-03-11 ENCOUNTER — Telehealth: Payer: Self-pay | Admitting: Family Medicine

## 2022-03-11 NOTE — Telephone Encounter (Signed)
Called the drug store and they say her insurance would not pay for nebulizer- insurance states they will pay for it but needs pa

## 2022-03-11 NOTE — Telephone Encounter (Signed)
I spoke to [facility] and they state insurance would not pay for it but she can fill it for $29.99. I called patient and let her know and she states this is not something she can afford that she just doesn't have the cash to spend on this. I am unsure what to do at this point

## 2022-03-12 ENCOUNTER — Ambulatory Visit (HOSPITAL_COMMUNITY): Payer: Medicaid Other | Admitting: Clinical

## 2022-03-12 ENCOUNTER — Telehealth (HOSPITAL_COMMUNITY): Payer: Self-pay | Admitting: Clinical

## 2022-03-12 NOTE — Telephone Encounter (Signed)
The patient did not respond to contact attempts or Voice Mail

## 2022-03-13 ENCOUNTER — Telehealth: Payer: Self-pay | Admitting: Family Medicine

## 2022-03-13 NOTE — Telephone Encounter (Signed)
  Prescription Request  03/13/2022  Is this a "Controlled Substance" medicine? No   Have you seen your PCP in the last 2 weeks? pt has appt with PCP in Feb 2024 has 4 pills left   If YES, route message to pool  -  If NO, patient needs to be scheduled for appointment.  What is the name of the medication or equipment?  omeprazole (PRILOSEC) 40 MG capsule  Have you contacted your pharmacy to request a refill? yes   Which pharmacy would you like this sent to? [facility]   Patient notified that their request is being sent to the clinical staff for review and that they should receive a response within 2 business days.

## 2022-03-15 DIAGNOSIS — M545 Low back pain, unspecified: Secondary | ICD-10-CM | POA: Diagnosis not present

## 2022-03-15 DIAGNOSIS — H6993 Unspecified Eustachian tube disorder, bilateral: Secondary | ICD-10-CM | POA: Diagnosis not present

## 2022-03-15 DIAGNOSIS — Z79899 Other long term (current) drug therapy: Secondary | ICD-10-CM | POA: Diagnosis not present

## 2022-03-15 DIAGNOSIS — E213 Hyperparathyroidism, unspecified: Secondary | ICD-10-CM | POA: Diagnosis not present

## 2022-03-19 MED ORDER — OMEPRAZOLE 40 MG PO CPDR: 40.0000 mg | DELAYED_RELEASE_CAPSULE | Freq: Every day | ORAL | 1 refills | Status: DC

## 2022-03-22 DIAGNOSIS — Z79899 Other long term (current) drug therapy: Secondary | ICD-10-CM | POA: Diagnosis not present

## 2022-03-25 DIAGNOSIS — Z419 Encounter for procedure for purposes other than remedying health state, unspecified: Secondary | ICD-10-CM | POA: Diagnosis not present

## 2022-03-29 ENCOUNTER — Ambulatory Visit (HOSPITAL_COMMUNITY): Payer: Medicaid Other | Admitting: Clinical

## 2022-04-02 ENCOUNTER — Other Ambulatory Visit: Payer: Self-pay | Admitting: Podiatry

## 2022-04-05 ENCOUNTER — Ambulatory Visit: Payer: Medicaid Other | Admitting: Family Medicine

## 2022-04-05 ENCOUNTER — Encounter: Payer: Self-pay | Admitting: Family Medicine

## 2022-04-05 VITALS — BP 106/67 | HR 91 | Temp 97.9°F | Ht 59.0 in | Wt 130.4 lb

## 2022-04-05 DIAGNOSIS — J0101 Acute recurrent maxillary sinusitis: Secondary | ICD-10-CM

## 2022-04-05 MED ORDER — CEFDINIR 300 MG PO CAPS: 300.0000 mg | ORAL_CAPSULE | Freq: Two times a day (BID) | ORAL | 0 refills | Status: AC

## 2022-04-05 NOTE — Progress Notes (Signed)
Acute Office Visit  Subjective:     Patient ID: Charlotte Perez, female    DOB: [birth year], 62 y.o.   MRN: [id number]  Chief Complaint  Patient presents with   Cough   Nasal Congestion    URI  This is a new problem. Episode onset: 7 days. The problem has been unchanged. There has been no fever. Associated symptoms include chest pain (with coughing), congestion, coughing (productice), ear pain, a plugged ear sensation, sinus pain (right maxillary) and wheezing (baseline). Pertinent negatives include no headaches, sore throat or vomiting. She has tried antihistamine (mucinex) for the symptoms. The treatment provided no relief.     Review of Systems  HENT:  Positive for congestion, ear pain and sinus pain (right maxillary). Negative for sore throat.   Respiratory:  Positive for cough (productice) and wheezing (baseline).   Cardiovascular:  Positive for chest pain (with coughing).  Gastrointestinal:  Negative for vomiting.  Neurological:  Negative for headaches.        Objective:    BP 106/67   Pulse 91   Temp 97.9 F (36.6 C) (Oral)   Ht '4\' 11"'$  (1.499 m)   Wt 130 lb 6 oz (59.1 kg)   SpO2 97%   BMI 26.33 kg/m    Physical Exam Vitals and nursing note reviewed.  Constitutional:      General: She is not in acute distress.    Appearance: She is ill-appearing. She is not toxic-appearing or diaphoretic.  HENT:     Head: Normocephalic and atraumatic.     Right Ear: A middle ear effusion is present. Tympanic membrane is not erythematous, retracted or bulging.     Left Ear: A middle ear effusion is present. Tympanic membrane is bulging. Tympanic membrane is not erythematous or retracted.     Nose: Congestion present.     Right Sinus: Maxillary sinus tenderness present. No frontal sinus tenderness.     Left Sinus: Maxillary sinus tenderness present. No frontal sinus tenderness.     Mouth/Throat:     Mouth: Mucous membranes are moist.     Pharynx: Oropharynx is clear. No  oropharyngeal exudate or posterior oropharyngeal erythema.  Eyes:     General:        Right eye: No discharge.        Left eye: No discharge.     Conjunctiva/sclera: Conjunctivae normal.  Cardiovascular:     Rate and Rhythm: Normal rate and regular rhythm.     Heart sounds: Normal heart sounds. No murmur heard. Pulmonary:     Effort: Pulmonary effort is normal. No respiratory distress.     Breath sounds: Normal breath sounds. No wheezing, rhonchi or rales.  Chest:     Chest wall: No tenderness.  Musculoskeletal:     Cervical back: Neck supple. No rigidity.     Right lower leg: No edema.     Left lower leg: No edema.  Lymphadenopathy:     Cervical: No cervical adenopathy.  Skin:    General: Skin is warm and dry.  Neurological:     General: No focal deficit present.     Mental Status: She is alert and oriented to person, place, and time.  Psychiatric:        Mood and Affect: Mood normal.        Behavior: Behavior normal.     No results found for any visits on 04/05/22.      Assessment & Plan:   Charlotte Perez was seen today for  cough and nasal congestion.  Diagnoses and all orders for this visit:  Acute recurrent maxillary sinusitis Recently treated with augmentin for sinusitis. Omnicef as below. Discussed symptomatic care and return precautions.  -     cefdinir (OMNICEF) 300 MG capsule; Take 1 capsule (300 mg total) by mouth 2 (two) times daily for 7 days. 1 po BID  Return to office for new or worsening symptoms, or if symptoms persist.   The patient indicates understanding of these issues and agrees with the plan.   Gwenlyn Perking, FNP

## 2022-04-14 ENCOUNTER — Other Ambulatory Visit: Payer: Self-pay | Admitting: Nurse Practitioner

## 2022-04-14 DIAGNOSIS — J0110 Acute frontal sinusitis, unspecified: Secondary | ICD-10-CM

## 2022-04-15 ENCOUNTER — Ambulatory Visit: Payer: Medicaid Other | Admitting: Family Medicine

## 2022-04-15 ENCOUNTER — Other Ambulatory Visit: Payer: Self-pay | Admitting: Family Medicine

## 2022-04-15 DIAGNOSIS — E213 Hyperparathyroidism, unspecified: Secondary | ICD-10-CM | POA: Diagnosis not present

## 2022-04-15 DIAGNOSIS — Z79899 Other long term (current) drug therapy: Secondary | ICD-10-CM | POA: Diagnosis not present

## 2022-04-15 DIAGNOSIS — E119 Type 2 diabetes mellitus without complications: Secondary | ICD-10-CM | POA: Diagnosis not present

## 2022-04-15 DIAGNOSIS — M545 Low back pain, unspecified: Secondary | ICD-10-CM | POA: Diagnosis not present

## 2022-04-17 DIAGNOSIS — Z79899 Other long term (current) drug therapy: Secondary | ICD-10-CM | POA: Diagnosis not present

## 2022-04-25 DIAGNOSIS — Z419 Encounter for procedure for purposes other than remedying health state, unspecified: Secondary | ICD-10-CM | POA: Diagnosis not present

## 2022-05-06 ENCOUNTER — Ambulatory Visit: Payer: Medicaid Other | Admitting: Family Medicine

## 2022-05-06 ENCOUNTER — Ambulatory Visit (INDEPENDENT_AMBULATORY_CARE_PROVIDER_SITE_OTHER): Payer: Medicaid Other | Admitting: Nurse Practitioner

## 2022-05-06 ENCOUNTER — Encounter: Payer: Self-pay | Admitting: Nurse Practitioner

## 2022-05-06 VITALS — BP 112/76 | HR 97 | Temp 97.3°F | Resp 20 | Ht 59.0 in | Wt 133.0 lb

## 2022-05-06 DIAGNOSIS — J40 Bronchitis, not specified as acute or chronic: Secondary | ICD-10-CM | POA: Diagnosis not present

## 2022-05-06 MED ORDER — ALBUTEROL SULFATE HFA 108 (90 BASE) MCG/ACT IN AERS: 2.0000 | INHALATION_SPRAY | Freq: Four times a day (QID) | RESPIRATORY_TRACT | 0 refills | Status: AC | PRN

## 2022-05-06 MED ORDER — BENZONATATE 100 MG PO CAPS: 100.0000 mg | ORAL_CAPSULE | Freq: Three times a day (TID) | ORAL | 0 refills | Status: DC | PRN

## 2022-05-06 MED ORDER — PREDNISONE 20 MG PO TABS: 40.0000 mg | ORAL_TABLET | Freq: Every day | ORAL | 0 refills | Status: AC

## 2022-05-06 NOTE — Progress Notes (Signed)
Subjective:    Patient ID: Charlotte Perez, female    DOB: Mar 21, 1961, 62 y.o.   MRN: [id number]   Chief Complaint: Hoarse, Sore Throat, and Ear Pain   Sore Throat  This is a new problem. The current episode started in the past 7 days. The problem has been waxing and waning. Neither side of throat is experiencing more pain than the other. The pain is at a severity of 0/10. Associated symptoms include congestion, coughing, ear pain, shortness of breath and trouble swallowing. Pertinent negatives include no headaches. Treatments tried: robitussin. The treatment provided mild relief.       Review of Systems  Constitutional:  Negative for chills and fever.  HENT:  Positive for congestion, ear pain and trouble swallowing.   Respiratory:  Positive for cough and shortness of breath.   Neurological:  Positive for dizziness. Negative for headaches.       Objective:   Physical Exam Constitutional:      Appearance: Normal appearance.  HENT:     Right Ear: Tympanic membrane normal.     Left Ear: Tympanic membrane normal.     Nose: Congestion present. No rhinorrhea.     Mouth/Throat:     Mouth: Mucous membranes are moist.  Cardiovascular:     Rate and Rhythm: Normal rate and regular rhythm.     Heart sounds: Normal heart sounds.  Pulmonary:     Effort: Pulmonary effort is normal.     Breath sounds: Normal breath sounds.     Comments: Deep dry cough Musculoskeletal:     Cervical back: Normal range of motion and neck supple.  Skin:    General: Skin is warm.  Neurological:     General: No focal deficit present.     Mental Status: She is alert and oriented to person, place, and time.  Psychiatric:        Mood and Affect: Mood normal.        Behavior: Behavior normal.    BP 112/76   Pulse 97   Temp (!) 97.3 F (36.3 C) (Temporal)   Resp 20   Ht 4' 11"$  (1.499 m)   Wt 133 lb (60.3 kg)   SpO2 100%   BMI 26.86 kg/m         Assessment & Plan:   Charlotte Perez in  today with chief complaint of Hoarse, Sore Throat, and Ear Pain   1. Bronchitis 1. Take meds as prescribed 2. Use a cool mist humidifier especially during the winter months and when heat has been humid. 3. Use saline nose sprays frequently 4. Saline irrigations of the nose can be very helpful if done frequently.  * 4X daily for 1 week*  * Use of a nettie pot can be helpful with this. Follow directions with this* 5. Drink plenty of fluids 6. Keep thermostat turn down low 7.For any cough or congestion- tessalon perles 8. For fever or aces or pains- take tylenol or ibuprofen appropriate for age and weight.  * for fevers greater than 101 orally you may alternate ibuprofen and tylenol every  3 hours.   Meds ordered this encounter  Medications   predniSONE (DELTASONE) 20 MG tablet    Sig: Take 2 tablets (40 mg total) by mouth daily with breakfast for 5 days. 2 po daily for 5 days    Dispense:  10 tablet    Refill:  0    Order Specific Question:   Supervising Provider    Answer:  DETTINGER, JOSHUA A [[id number]]   albuterol (VENTOLIN HFA) 108 (90 Base) MCG/ACT inhaler    Sig: Inhale 2 puffs into the lungs every 6 (six) hours as needed for wheezing or shortness of breath.    Dispense:  8 g    Refill:  0    Order Specific Question:   Supervising Provider    Answer:   Caryl Pina A [[id number]]   benzonatate (TESSALON PERLES) 100 MG capsule    Sig: Take 1 capsule (100 mg total) by mouth 3 (three) times daily as needed.    Dispense:  20 capsule    Refill:  0    Order Specific Question:   Supervising Provider    Answer:   Caryl Pina A [id number]   Watch blood sugars because steroids will in increase blood sugar- strict low carb diet    The above assessment and management plan was discussed with the patient. The patient verbalized understanding of and has agreed to the management plan. Patient is aware to call the clinic if symptoms persist or worsen. Patient is aware when to  return to the clinic for a follow-up visit. Patient educated on when it is appropriate to go to the emergency department.   Mary-Margaret Hassell Done, FNP

## 2022-05-06 NOTE — Patient Instructions (Signed)
  Acute Bronchitis, Adult  Acute bronchitis is when air tubes in the lungs (bronchi) suddenly get swollen. The condition can make it hard for you to breathe. In adults, acute bronchitis usually goes away within 2 weeks. A cough caused by bronchitis may last up to 3 weeks. Smoking, allergies, and asthma can make the condition worse. What are the causes? Germs that cause cold and flu (viruses). The most common cause of this condition is the virus that causes the common cold. Bacteria. Substances that bother (irritate) the lungs, including: Smoke from cigarettes and other types of tobacco. Dust and pollen. Fumes from chemicals, gases, or burned fuel. Indoor or outdoor air pollution. What increases the risk? A weak body's defense system. This is also called the immune system. Any condition that affects your lungs and breathing, such as asthma. What are the signs or symptoms? A cough. Coughing up clear, yellow, or green mucus. Making high-pitched whistling sounds when you breathe, most often when you breathe out (wheezing). Runny or stuffy nose. Having too much mucus in your lungs (chest congestion). Shortness of breath. Body aches. A sore throat. How is this treated? Acute bronchitis may go away over time without treatment. Your doctor may tell you to: Drink more fluids. This will help thin your mucus so it is easier to cough up. Use a device that gets medicine into your lungs (inhaler). Use a vaporizer or a humidifier. These are machines that add water to the air. This helps with coughing and poor breathing. Take a medicine that thins mucus and helps clear it from your lungs. Take a medicine that prevents or stops coughing. It is not common to take an antibiotic medicine for this condition. Follow these instructions at home:  Take over-the-counter and prescription medicines only as told by your doctor. Use an inhaler, vaporizer, or humidifier as told by your doctor. Take two  teaspoons (10 mL) of honey at bedtime. This helps lessen your coughing at night. Drink enough fluid to keep your pee (urine) pale yellow. Do not smoke or use any products that contain nicotine or tobacco. If you need help quitting, ask your doctor. Get a lot of rest. Return to your normal activities when your doctor says that it is safe. Keep all follow-up visits. How is this prevented?  Wash your hands often with soap and water for at least 20 seconds. If you cannot use soap and water, use hand sanitizer. Avoid contact with people who have cold symptoms. Try not to touch your mouth, nose, or eyes with your hands. Avoid breathing in smoke or chemical fumes. Make sure to get the flu shot every year. Contact a doctor if: Your symptoms do not get better in 2 weeks. You have trouble coughing up the mucus. Your cough keeps you awake at night. You have a fever. Get help right away if: You cough up blood. You have chest pain. You have very bad shortness of breath. You faint or keep feeling like you are going to faint. You have a very bad headache. Your fever or chills get worse. These symptoms may be an emergency. Get help right away. Call your local emergency services (911 in the [place] Do not wait to see if the symptoms will go away. Do not drive yourself to the hospital. Summary Acute bronchitis is when air tubes in the lungs (bronchi) suddenly get swollen. In adults, acute bronchitis usually goes away within 2 weeks. Drink more fluids. This will help thin your mucus so it   is easier to cough up. Take over-the-counter and prescription medicines only as told by your doctor. Contact a doctor if your symptoms do not improve after 2 weeks of treatment. This information is not intended to replace advice given to you by your health care provider. Make sure you discuss any questions you have with your health care provider. Document Revised: 07/12/2020 Document Reviewed: 07/12/2020 Elsevier  Patient Education  2023 [employer]  

## 2022-05-08 ENCOUNTER — Ambulatory Visit (INDEPENDENT_AMBULATORY_CARE_PROVIDER_SITE_OTHER): Payer: Medicaid Other | Admitting: Clinical

## 2022-05-08 DIAGNOSIS — F321 Major depressive disorder, single episode, moderate: Secondary | ICD-10-CM

## 2022-05-08 DIAGNOSIS — F411 Generalized anxiety disorder: Secondary | ICD-10-CM | POA: Diagnosis not present

## 2022-05-08 NOTE — Addendum Note (Signed)
Addended by: Maye Hides T on: 05/08/2022 01:54 PM   Modules accepted: Level of Service

## 2022-05-08 NOTE — Progress Notes (Signed)
IN PERSON  I connected with Charlotte Perez on 05/08/22 at  9:00 AM EST in person and verified that I am speaking with the correct person using two identifiers.  Location: Patient: Office Provider: Office   I discussed the limitations of evaluation and management by telemedicine and the availability of in person appointments. The patient expressed understanding and agreed to proceed.(IN PERSON)   THERAPIST PROGRESS NOTE   Session Time: 9:00 AM-9:30 AM   Participation Level: Active   Behavioral Response: CasualAlertDepressed   Type of Therapy: Individual Therapy   Treatment Goals addressed: Coping   Interventions: CBT, DBT, Solution Focused, Strength-based and Supportive   Summary: Charlotte Perez. Hasselman is a 62 y.o. female who presents with Depression and GAD. The OPT therapist worked with the patient for her OPT treatment. The OPT therapist utilized Motivational Interviewing to assist in creating therapeutic repore. The patient in the session was engaged and work in Science writer. The patient spoke about getting back with  her ex who has been living with her for the past week. The OPT therapist utilized Cognitive Behavioral Therapy through cognitive restructuring as well as worked with the patient on coping strategies to assist in management of mood including ongoing motivation and empowerment for the patient to be as active as possible with her health limitations. The patient spoke about her plans for the future with her partner and her hopes things will not go back to the way that they were before.  The OPT therapist continued work with the patient on coping strategies and the patient spoke about her willingness to do her self check ins and use her coping skills/outlets as needed moving forward. The OPT therapist overviewed all upcoming appointments from the patient [name].   Suicidal/Homicidal: Nowithout intent/plan   Therapist Response: The OPT therapist worked with the  patient for the patients scheduled session. The patient was engaged in her session and gave feedback in relation to triggers, symptoms, and behavior responses over the past few weeks.The OPT therapist worked with the patient utilizing an in session Cognitive Behavioral Therapy exercise. The patient was responsive in the session and verbalized, " I just went to the beach with my ex and we are back together and he is changing and we talked about what has to happen moving forward and he knows if things go back to how they were before I am gone and I am going to cut my ties ". The OPT therapist worked with the patient on work towards consistency and stability and utilizing coping strategies as needed as she is in going back with her ex taking on a lot of caregiving responsibilities for her partner who struggles with health related problems.including early dementia. The OPT therapist overviewed with the patient her med therapy with upcoming med follow up appointment for tomorrow with [name] and the patient indicated she felt her medicine is working really well currently.  The OPT therapist will continue treatment work with the patient in her next scheduled session.      Plan: Return again in 2 weeks.   Diagnosis:      Axis I: Moderate single episode of Major Depressive Disorder / GAD                           Axis II: No diagnosis     Collaboration of Care: No additional collaboration of care for this session   Patient/Guardian was advised Release of Information must  be obtained prior to any record release in order to collaborate their care with an outside provider. Patient/Guardian was advised if they have not already done so to contact the registration department to sign all necessary forms in order for Charlotte Perez to release information regarding their care.    Consent: Patient/Guardian gives verbal consent for treatment and assignment of benefits for services provided during this visit. Patient/Guardian  expressed understanding and agreed to proceed.    I discussed the assessment and treatment plan with the patient. The patient was provided an opportunity to ask questions and all were answered. The patient agreed with the plan and demonstrated an understanding of the instructions.   The patient was advised to call back or seek an in-person evaluation if the symptoms worsen or if the condition fails to improve as anticipated.   I provided 30 minutes of face-to-face time during this encounter.   Lennox Grumbles, LCSW   05/08/2022

## 2022-05-09 ENCOUNTER — Ambulatory Visit (INDEPENDENT_AMBULATORY_CARE_PROVIDER_SITE_OTHER): Payer: Medicaid Other | Admitting: Psychiatry

## 2022-05-09 ENCOUNTER — Encounter (HOSPITAL_COMMUNITY): Payer: Self-pay | Admitting: Psychiatry

## 2022-05-09 VITALS — BP 113/76 | HR 89 | Ht 59.0 in | Wt 131.8 lb

## 2022-05-09 DIAGNOSIS — F411 Generalized anxiety disorder: Secondary | ICD-10-CM | POA: Diagnosis not present

## 2022-05-09 DIAGNOSIS — F321 Major depressive disorder, single episode, moderate: Secondary | ICD-10-CM

## 2022-05-09 MED ORDER — VENLAFAXINE HCL ER 150 MG PO CP24: 300.0000 mg | ORAL_CAPSULE | Freq: Every day | ORAL | 2 refills | Status: DC

## 2022-05-09 MED ORDER — ALPRAZOLAM 1 MG PO TABS: 1.0000 mg | ORAL_TABLET | Freq: Every evening | ORAL | 2 refills | Status: DC | PRN

## 2022-05-09 MED ORDER — ARIPIPRAZOLE 2 MG PO TABS: 2.0000 mg | ORAL_TABLET | Freq: Every day | ORAL | 2 refills | Status: DC

## 2022-05-09 NOTE — Progress Notes (Signed)
BH MD/PA/NP OP Progress Note  05/09/2022 9:05 AM Charlotte Perez  MRN:  [id number]  Chief Complaint:  Chief Complaint  Patient presents with   Depression   Anxiety   Follow-up   HPI: This patient is a 62 year old married white female who is now living with her husband in [place].  She is on disability.  The patient returns for follow-up after about 3 months.  Last time she had been separated from her husband and stayed apart for several months.  However he has early dementia and she felt responsible for them and went back to them.  He is to be mean and the rating to her but she states now he is nice and is helping her with a lot of chores around the house.  His brother lives there and the brother tends to abuse drugs and they are trying their best to get him out.  The patient's mood has generally been stable.  She denies significant depression or anxiety.  She finds she is often very sleepy during the day and wonders if she has sleep apnea.  She only takes oxycodone as needed and I do not think it is from any of her other medications.  She is going to talk to her PCP about referral for sleep apnea test. Visit Diagnosis:    ICD-10-CM   1. Moderate single current episode of major depressive disorder ([place])  F32.1     2. 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   DDD (degenerative disc disease), cervical    DDD (degenerative disc disease), lumbar    Depression    Diabetes mellitus ([place]) 06/27/2010   Diabetes mellitus, type II ([place])    Gallbladder sludge    GERD (gastroesophageal reflux  disease)    Hyperlipidemia    Hypertension    Iron deficiency anemia 09/05/2016   Lumbar radiculopathy    Mole (skin)    Neuropathy of foot    Renal cyst    Shingles    Skin lesions, generalized    1.2 CM FLAT FAWN COLOR AT THE T10 AREA JUST RIGHT OF SPINE     Past Surgical History:  Procedure Laterality Date   ABDOMINAL HYSTERECTOMY     total hysterectomy   BACK SURGERY     BIOPSY  03/07/2021   Procedure: BIOPSY;  Surgeon: Harvel Quale, MD;  [facility]: [facility];  Service: Gastroenterology;;   CESAREAN SECTION     x 2   ESOPHAGOGASTRODUODENOSCOPY (EGD) WITH PROPOFOL N/A 03/07/2021   Procedure: ESOPHAGOGASTRODUODENOSCOPY (EGD) WITH PROPOFOL;  Surgeon: Harvel Quale, MD;  Location: [facility];  Service: Gastroenterology;  Laterality: N/A;  9:15   NECK SURGERY      Family Psychiatric History: See below  Family History:  Family History  Problem Relation Age of Onset   ADD / ADHD Other    COPD Father    Alcohol abuse Father    Depression Daughter    Alcohol abuse Mother    COPD Mother        on oxygen  Colon cancer Paternal Grandfather    Heart disease Sister    Heart disease Brother    Arthritis Sister        knee replacement   Diabetes Maternal Grandmother    Diabetes Sister     Social History:  Social History   Socioeconomic History   Marital status: Legally Separated    Spouse name: Ronnie   Number of children: 3   Years of education: 9   Highest education level: 9th grade  Occupational History   Occupation: disablility  Tobacco Use   Smoking status: Never    Passive exposure: Yes   Smokeless tobacco: Never  Vaping Use   Vaping Use: Never used  Substance and Sexual Activity   Alcohol use: No   Drug use: No   Sexual activity: Not Currently    Birth control/protection: Surgical    Comment: hyst  Other Topics Concern   Not on file  Social History Narrative   Not on file   Social Determinants of Health    Financial Resource Strain: Low Risk  (11/30/2020)   Overall Financial Resource Strain (CARDIA)    Difficulty of Paying Living Expenses: Not very hard  Food Insecurity: Food Insecurity Present (11/30/2020)   Hunger Vital Sign    Worried About [place] in the Last Year: Sometimes true    Ran Out of Food in the Last Year: Sometimes true  Transportation Needs: No Transportation Needs (11/30/2020)   PRAPARE - Hydrologist (Medical): No    Lack of Transportation (Non-Medical): No  Physical Activity: Insufficiently Active (11/30/2020)   Exercise Vital Sign    Days of Exercise per Week: 1 day    Minutes of Exercise per Session: 60 min  Stress: No Stress Concern Present (11/30/2020)   [place]    Feeling of Stress : Only a little  Social Connections: Moderately Isolated (11/30/2020)   Social Connection and Isolation Panel [NHANES]    Frequency of Communication with Friends and Family: Three times a week    Frequency of Social Gatherings with Friends and Family: Never    Attends Religious Services: Never    Marine scientist or Organizations: No    Attends Archivist Meetings: Never    Marital Status: Married    Allergies:  Allergies  Allergen Reactions   Cymbalta [Duloxetine Hcl]     Per pt it started making her mouth tingle and could not taste anything   Gabapentin     Itching, fidgety   Meloxicam Other (See Comments)    Tongue tingling, and lost sense of taste.   Tramadol Other (See Comments)    Tongue tingling, and lost sense of taste.    Metabolic Disorder Labs: Lab Results  Component Value Date   HGBA1C 7.1 (H) 02/07/2022   No results found for: "PROLACTIN" Lab Results  Component Value Date   CHOL 162 02/07/2022   TRIG 81 02/07/2022   HDL 70 02/07/2022   CHOLHDL 2.3 02/07/2022   LDLCALC 77 02/07/2022   LDLCALC 69 05/11/2020   Lab Results  Component  Value Date   TSH 1.44 11/16/2020   TSH 1.760 07/26/2016    Therapeutic Level Labs: No results found for: "LITHIUM" No results found for: "VALPROATE" No results found for: "CBMZ"  Current Medications: Current Outpatient Medications  Medication Sig Dispense Refill   ACCU-CHEK FASTCLIX LANCETS MISC EVERY DAY 102 each 2   albuterol (  PROVENTIL) (2.5 MG/3ML) 0.083% nebulizer solution Take 3 mLs (2.5 mg total) by nebulization every 6 (six) hours as needed for wheezing or shortness of breath. 75 mL 3   albuterol (VENTOLIN HFA) 108 (90 Base) MCG/ACT inhaler Inhale 2 puffs into the lungs every 6 (six) hours as needed for wheezing or shortness of breath. 8 g 0   atorvastatin (LIPITOR) 40 MG tablet TAKE ONE (1) TABLET BY MOUTH EVERY DAY 90 tablet 3   benzonatate (TESSALON PERLES) 100 MG capsule Take 1 capsule (100 mg total) by mouth 3 (three) times daily as needed. 20 capsule 0   cycloSPORINE (RESTASIS) 0.05 % ophthalmic emulsion Place 1 drop into both eyes at bedtime.     dicyclomine (BENTYL) 10 MG capsule Take 10 mg by mouth 2 times daily at 12 noon and 4 pm.     empagliflozin (JARDIANCE) 25 MG TABS tablet Take 1 tablet (25 mg total) by mouth daily. 90 tablet 3   fluticasone (FLONASE) 50 MCG/ACT nasal spray USE 2 SPRAYS IN EACH NOSTRIL DAILY 16 g 2   glucose blood (ACCU-CHEK GUIDE) test strip Use daily Dx E11.9 100 each 3   HYDROcodone-acetaminophen (NORCO) 10-325 MG tablet Take 1 tablet by mouth 3 (three) times daily as needed for pain.     latanoprost (XALATAN) 0.005 % ophthalmic solution Place 1 drop into both eyes at bedtime.     linaclotide (LINZESS) 145 MCG CAPS capsule Take 1 capsule (145 mcg total) by mouth daily before breakfast. 90 capsule 3   lisinopril (ZESTRIL) 5 MG tablet TAKE ONE (1) TABLET EACH DAY (Patient taking differently: Take 2.5 mg by mouth daily. TAKE ONE (1) TABLET EACH DAY) 90 tablet 0   meclizine (ANTIVERT) 25 MG tablet Take 50 mg by mouth 2 (two) times daily.      metFORMIN (GLUCOPHAGE) 1000 MG tablet TAKE ONE TABLET TWICE A DAY WITH FOOD (Patient taking differently: Take 1,000 mg by mouth 2 (two) times daily with a meal.) 180 tablet 0   omeprazole (PRILOSEC) 40 MG capsule Take 1 capsule (40 mg total) by mouth daily. TAKE ONE (1) CAPSULE EACH DAY Strength: 40 mg 90 capsule 1   predniSONE (DELTASONE) 20 MG tablet Take 2 tablets (40 mg total) by mouth daily with breakfast for 5 days. 2 po daily for 5 days 10 tablet 0   pregabalin (LYRICA) 50 MG capsule TAKE ONE (1) CAPSULE THREE (3) TIMES EACH DAY 90 capsule 2   sitaGLIPtin (JANUVIA) 25 MG tablet Take 1 tablet (25 mg total) by mouth daily. 90 tablet 1   ALPRAZolam (XANAX) 1 MG tablet Take 1 tablet (1 mg total) by mouth at bedtime as needed. 30 tablet 2   ARIPiprazole (ABILIFY) 2 MG tablet Take 1 tablet (2 mg total) by mouth daily. 30 tablet 2   venlafaxine XR (EFFEXOR-XR) 150 MG 24 hr capsule Take 2 capsules (300 mg total) by mouth at bedtime. 60 capsule 2   No current facility-administered medications for this visit.     Musculoskeletal: Strength & Muscle Tone: within normal limits Gait & Station: normal Patient leans: N/A  Psychiatric Specialty Exam: Review of Systems  Constitutional:  Positive for fatigue.  Musculoskeletal:  Positive for back pain.  All other systems reviewed and are negative.   Blood pressure 113/76, pulse 89, height 4' 11"$  (1.499 m), weight 131 lb 12.8 oz (59.8 kg), SpO2 99 %.Body mass index is 26.62 kg/m.  General Appearance: Casual and Fairly Groomed  Eye Contact:  Good  Speech:  Clear and Coherent  Volume:  Normal  Mood:  Euthymic  Affect:  Congruent  Thought Process:  Goal Directed  Orientation:  Full (Time, Place, and Person)  Thought Content: WDL   Suicidal Thoughts:  No  Homicidal Thoughts:  No  Memory:  Immediate;   Good Recent;   Good Remote;   Good  Judgement:  Good  Insight:  Fair  Psychomotor Activity:  Normal  Concentration:  Concentration: Good and  Attention Span: Good  Recall:  Good  Fund of Knowledge: Good  Language: Good  Akathisia:  No  Handed:  Right  AIMS (if indicated): not done  Assets:  Communication Skills Desire for Improvement Resilience Social Support Talents/Skills  ADL's:  Intact  Cognition: WNL  Sleep:  Good   Screenings: GAD-7    Flowsheet Row Office Visit from 05/09/2022 in [facility] at [place] Visit from 04/05/2022 in [facility] Office Visit from 03/06/2022 in [facility] Office Visit from 02/07/2022 in [place] from 12/05/2021 in [place] at [place]  Total GAD-7 Score 6 6 11 5 9      $ PHQ2-9    [place] Office Visit from 05/09/2022 in [facility] at [place] Visit from 04/05/2022 in [facility] Office Visit from 03/06/2022 in [facility] Office Visit from 02/07/2022 in [place] Video Visit from 02/06/2022 in [place] at [facility] Total Score 2 3 3 2 $ 0  PHQ-9 Total Score 8 13 11 5 $ --      Flowsheet Row Video Visit from 02/06/2022 in [facility] at [place] from 12/05/2021 in [facility] at [place] ED from 11/28/2021 in [facility] at [place] No Risk Error: Q3, 4, or 5 should not be populated when Q2 is No High Risk        Assessment and Plan: Patient is a 62 year old female with a history of depression and anxiety.  She seems to be doing well.  She will continue Effexor XR 300 mg daily for depression, Abilify 2 mg for augmentation and Xanax 1 mg at bedtime for anxiety and sleep as needed.  She will return to  see me in 3 months  Collaboration of Care: Collaboration of Care: Referral or follow-up with counselor/therapist AEB patient will continue therapy with Maye Hides in our office  Patient/Guardian was advised Release of Information must be obtained prior to any record release in order to collaborate their care with an outside provider. Patient/Guardian was advised if they have not already done so to contact the registration department to sign all necessary forms in order for [place] to release information regarding their care.   Consent: Patient/Guardian gives verbal consent for treatment and assignment of benefits for services provided during this visit. Patient/Guardian expressed understanding and agreed to proceed.    [name], MD 05/09/2022, 9:05 AM

## 2022-05-10 ENCOUNTER — Ambulatory Visit: Payer: Medicaid Other | Admitting: Family Medicine

## 2022-05-14 DIAGNOSIS — E119 Type 2 diabetes mellitus without complications: Secondary | ICD-10-CM | POA: Diagnosis not present

## 2022-05-14 DIAGNOSIS — M545 Low back pain, unspecified: Secondary | ICD-10-CM | POA: Diagnosis not present

## 2022-05-14 DIAGNOSIS — Z79899 Other long term (current) drug therapy: Secondary | ICD-10-CM | POA: Diagnosis not present

## 2022-05-14 DIAGNOSIS — E213 Hyperparathyroidism, unspecified: Secondary | ICD-10-CM | POA: Diagnosis not present

## 2022-05-14 DIAGNOSIS — E559 Vitamin D deficiency, unspecified: Secondary | ICD-10-CM | POA: Diagnosis not present

## 2022-05-14 DIAGNOSIS — Z6826 Body mass index (BMI) 26.0-26.9, adult: Secondary | ICD-10-CM | POA: Diagnosis not present

## 2022-05-15 ENCOUNTER — Other Ambulatory Visit: Payer: Self-pay | Admitting: Family Medicine

## 2022-05-15 ENCOUNTER — Telehealth: Payer: Self-pay | Admitting: Family Medicine

## 2022-05-15 ENCOUNTER — Ambulatory Visit: Payer: Medicaid Other | Admitting: Family Medicine

## 2022-05-15 ENCOUNTER — Encounter: Payer: Self-pay | Admitting: Family Medicine

## 2022-05-15 VITALS — BP 111/68 | HR 82 | Temp 98.1°F | Ht 59.0 in | Wt 138.0 lb

## 2022-05-15 DIAGNOSIS — J4489 Other specified chronic obstructive pulmonary disease: Secondary | ICD-10-CM

## 2022-05-15 DIAGNOSIS — E785 Hyperlipidemia, unspecified: Secondary | ICD-10-CM

## 2022-05-15 DIAGNOSIS — Z23 Encounter for immunization: Secondary | ICD-10-CM

## 2022-05-15 DIAGNOSIS — I152 Hypertension secondary to endocrine disorders: Secondary | ICD-10-CM | POA: Diagnosis not present

## 2022-05-15 DIAGNOSIS — E1159 Type 2 diabetes mellitus with other circulatory complications: Secondary | ICD-10-CM

## 2022-05-15 DIAGNOSIS — R0683 Snoring: Secondary | ICD-10-CM | POA: Diagnosis not present

## 2022-05-15 DIAGNOSIS — E1165 Type 2 diabetes mellitus with hyperglycemia: Secondary | ICD-10-CM | POA: Diagnosis not present

## 2022-05-15 DIAGNOSIS — K219 Gastro-esophageal reflux disease without esophagitis: Secondary | ICD-10-CM | POA: Diagnosis not present

## 2022-05-15 DIAGNOSIS — E1169 Type 2 diabetes mellitus with other specified complication: Secondary | ICD-10-CM | POA: Diagnosis not present

## 2022-05-15 DIAGNOSIS — R400 Somnolence: Secondary | ICD-10-CM

## 2022-05-15 LAB — BAYER DCA HB A1C WAIVED: HB A1C (BAYER DCA - WAIVED): 7.8 % — ABNORMAL HIGH (ref 4.8–5.6)

## 2022-05-15 MED ORDER — TRELEGY ELLIPTA 100-62.5-25 MCG/ACT IN AEPB: 1.0000 | INHALATION_SPRAY | Freq: Every day | RESPIRATORY_TRACT | 3 refills | Status: DC

## 2022-05-15 MED ORDER — SITAGLIPTIN PHOSPHATE 50 MG PO TABS: 50.0000 mg | ORAL_TABLET | Freq: Every day | ORAL | 3 refills | Status: DC

## 2022-05-15 MED ORDER — TRELEGY ELLIPTA 100-62.5-25 MCG/ACT IN AEPB: 1.0000 | INHALATION_SPRAY | Freq: Every day | RESPIRATORY_TRACT | 11 refills | Status: DC

## 2022-05-15 NOTE — Progress Notes (Signed)
Established Patient Office Visit  Subjective   Patient ID: Charlotte Perez, female    DOB: 1961/03/12  Age: 62 y.o. MRN: [id number]  Chief Complaint  Patient presents with   Medical Management of Chronic Issues   Diabetes    Diabetes   T2DM Pt presents for follow up evaluation of Type 2 diabetes mellitus. Patient denies foot ulcerations, increased appetite, nausea, polydipsia, polyuria, visual disturbances, vomiting, and weight loss.  Current diabetic medications include jardiance, januvia, metformin Compliant with meds - Yes  Current monitoring regimen:  2-3x a week Home blood sugar records: fasting range: 130s Any episodes of hypoglycemia? no  Current diet:  regular, since reports diet has not been good lately Current exercise: none  Is She on ACE inhibitor or angiotensin II receptor blocker?  Yes, lisinopril Is She on statin? Yes atorvastatin  2. HTN Complaint with meds - Yes Current Medications - lisinopril Pertinent ROS:  Headache - No Fatigue - No Visual Disturbances - No Chest pain - No Dyspnea - No Palpitations - No LE edema - No  3. HLD On atorvastatin. Regular diet. No exercise. Denies side effects.   4. GERD Compliant with medications - No Current medications - omeprazole. She has been out of this for 6 weeks.  Sore throat - No Voice change - No Hemoptysis - No Dysphagia or dyspepsia - No Water brash - Yes Red Flags (weight loss, hematochezia, melena, weight loss, early satiety, fevers, odynophagia, or persistent vomiting) - No  5. COPD Report shortness of breath in the morning. She has a chronic cough. She denies wheezing typically. She is not on a daily inhaler. She is using her albuterol nebulizer once a day.   6. Snoring Reports nightly loud snoring. She also reports that she has woke herself up gasping. She reports that she doesn't feel rested when waking up. She also reports daytime somnolence and has trouble staying awake if she sits  for any length of time.    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    Objective:     BP 111/68   Pulse 82   Temp 98.1 F (36.7 C) (Temporal)   Ht 4' 11"$  (1.499 m)   Wt 138 lb (62.6 kg)   SpO2 98%   BMI 27.87 kg/m  BP Readings from Last 3 Encounters:  05/15/22 111/68  05/06/22 112/76  04/05/22 106/67      Physical Exam Vitals and nursing note reviewed.  Constitutional:      General: She is  not in acute distress.    Appearance: Normal appearance. She is not ill-appearing, toxic-appearing or diaphoretic.  Neck:     Vascular: No carotid bruit.  Cardiovascular:     Rate and Rhythm: Normal rate and regular rhythm.     Pulses: Normal pulses.     Heart sounds: Normal heart sounds. No murmur heard. Pulmonary:     Effort: Pulmonary effort is normal. No respiratory distress.     Breath sounds: Normal breath sounds.  Abdominal:     General: Bowel sounds are normal. There is no distension.     Palpations: Abdomen is soft. There is no mass.     Tenderness: There is no abdominal tenderness. There is no guarding or rebound.  Musculoskeletal:     Cervical back: Neck supple. No rigidity.     Right  lower leg: No edema.     Left lower leg: No edema.  Skin:    General: Skin is warm and dry.  Neurological:     General: No focal deficit present.     Mental Status: She is alert and oriented to person, place, and time.  Psychiatric:        Mood and Affect: Mood normal.        Behavior: Behavior normal.        Thought Content: Thought content normal.        Judgment: Judgment normal.      No results found for any visits on 05/15/22.    The 10-year ASCVD risk score (Arnett DK, et al., 2019) is: 5.9%    Assessment & Plan:   Brightyn was seen today for medical management of chronic issues and diabetes.  Diagnoses and all orders for this visit:  Type 2 diabetes mellitus with hyperglycemia, without long-term current use of insulin (HCC) A1c 7.8 today, not at goal of <7. Medication changes today: increase januvia to 25 mg. Continue jardiance and metformin. She is on an ACE/ARB and statin. Eye exam: UTD. Foot exam: UTD. Urine micro: UTD. Diet and exercise.  -     Vitamin B12 -     Bayer DCA Hb A1c Waived -     sitaGLIPtin (JANUVIA) 50 MG tablet; Take 1 tablet (50 mg total) by mouth daily.  Hypertension associated with diabetes ([place]) Well controlled on current regimen.   Hyperlipidemia associated with type 2 diabetes mellitus ([place]) On statin. Last LDL at goal.   Gastroesophageal reflux disease without esophagitis Uncontrolled. Restart omeprazole. No red flags.   COPD with asthma Start trelegy.  -     Fluticasone-Umeclidin-Vilant (TRELEGY ELLIPTA) 100-62.5-25 MCG/ACT AEPB; Inhale 1 puff into the lungs daily.  Loud snoring Daytime somnolence ? OSA. Referral placed.  -     Ambulatory referral to Sleep Studies  Need for vaccination -     Zoster Recombinant (Shingrix )   Return in about 3 months (around 08/13/2022) for chronic follow up.   The patient indicates understanding of these issues and agrees with the plan.  Gwenlyn Perking, FNP

## 2022-05-15 NOTE — Progress Notes (Signed)
Rx fixed.

## 2022-05-15 NOTE — Telephone Encounter (Signed)
Charlotte Perez called from [facility] to let PCP know that they received Trelegy Rx but says its not covered by insurance. Needs to resend with quantity of 60 because #60 equals 1 inhaler.

## 2022-05-15 NOTE — Telephone Encounter (Signed)
Rx fixed.

## 2022-05-16 LAB — VITAMIN B12: Vitamin B-12: 382 pg/mL (ref 232–1245)

## 2022-05-16 MED ORDER — BREZTRI AEROSPHERE 160-9-4.8 MCG/ACT IN AERO: 2.0000 | INHALATION_SPRAY | Freq: Two times a day (BID) | RESPIRATORY_TRACT | 11 refills | Status: DC

## 2022-05-16 NOTE — Telephone Encounter (Signed)
Breztri sent instead.

## 2022-05-16 NOTE — Addendum Note (Signed)
Addended by: Gwenlyn Perking on: 05/16/2022 02:02 PM   Modules accepted: Orders

## 2022-05-16 NOTE — Telephone Encounter (Signed)
Spoke with Aaron Edelman from [facility] who received corrected script and resent it to insurance to see if they would cover and Aaron Edelman says its still showing as non covered.   What does PCP want to do?

## 2022-05-20 DIAGNOSIS — E119 Type 2 diabetes mellitus without complications: Secondary | ICD-10-CM | POA: Diagnosis not present

## 2022-05-20 DIAGNOSIS — H2513 Age-related nuclear cataract, bilateral: Secondary | ICD-10-CM | POA: Diagnosis not present

## 2022-05-20 DIAGNOSIS — H5213 Myopia, bilateral: Secondary | ICD-10-CM | POA: Diagnosis not present

## 2022-05-20 DIAGNOSIS — H40013 Open angle with borderline findings, low risk, bilateral: Secondary | ICD-10-CM | POA: Diagnosis not present

## 2022-05-23 ENCOUNTER — Encounter: Payer: Self-pay | Admitting: Radiology

## 2022-05-30 ENCOUNTER — Ambulatory Visit: Payer: Medicaid Other | Admitting: Podiatry

## 2022-06-04 ENCOUNTER — Ambulatory Visit (INDEPENDENT_AMBULATORY_CARE_PROVIDER_SITE_OTHER): Payer: Medicaid Other

## 2022-06-04 ENCOUNTER — Ambulatory Visit (INDEPENDENT_AMBULATORY_CARE_PROVIDER_SITE_OTHER): Payer: Medicaid Other | Admitting: Podiatry

## 2022-06-04 DIAGNOSIS — M779 Enthesopathy, unspecified: Secondary | ICD-10-CM

## 2022-06-04 DIAGNOSIS — M722 Plantar fascial fibromatosis: Secondary | ICD-10-CM | POA: Diagnosis not present

## 2022-06-04 MED ORDER — PREGABALIN 75 MG PO CAPS: 75.0000 mg | ORAL_CAPSULE | Freq: Two times a day (BID) | ORAL | 0 refills | Status: DC

## 2022-06-04 NOTE — Patient Instructions (Addendum)
Plantar Fasciitis (Heel Spur Syndrome) with Rehab The plantar fascia is a fibrous, ligament-like, soft-tissue structure that spans the bottom of the foot. Plantar fasciitis is a condition that causes pain in the foot due to inflammation of the tissue. SYMPTOMS  Pain and tenderness on the underneath side of the foot. Pain that worsens with standing or walking. CAUSES  Plantar fasciitis is caused by irritation and injury to the plantar fascia on the underneath side of the foot. Common mechanisms of injury include: Direct trauma to bottom of the foot. Damage to a small nerve that runs under the foot where the main fascia attaches to the heel bone. Stress placed on the plantar fascia due to bone spurs. RISK INCREASES WITH:  Activities that place stress on the plantar fascia (running, jumping, pivoting, or cutting). Poor strength and flexibility. Improperly fitted shoes. Tight calf muscles. Flat feet. Failure to warm-up properly before activity. Obesity. PREVENTION Warm up and stretch properly before activity. Allow for adequate recovery between workouts. Maintain physical fitness: Strength, flexibility, and endurance. Cardiovascular fitness. Maintain a health body weight. Avoid stress on the plantar fascia. Wear properly fitted shoes, including arch supports for individuals who have flat feet.  PROGNOSIS  If treated properly, then the symptoms of plantar fasciitis usually resolve without surgery. However, occasionally surgery is necessary.  RELATED COMPLICATIONS  Recurrent symptoms that may result in a chronic condition. Problems of the lower back that are caused by compensating for the injury, such as limping. Pain or weakness of the foot during push-off following surgery. Chronic inflammation, scarring, and partial or complete fascia tear, occurring more often from repeated injections.  TREATMENT  Treatment initially involves the use of ice and medication to help reduce pain and  inflammation. The use of strengthening and stretching exercises may help reduce pain with activity, especially stretches of the Achilles tendon. These exercises may be performed at home or with a therapist. Your caregiver may recommend that you use heel cups of arch supports to help reduce stress on the plantar fascia. Occasionally, corticosteroid injections are given to reduce inflammation. If symptoms persist for greater than 6 months despite non-surgical (conservative), then surgery may be recommended.   MEDICATION  If pain medication is necessary, then nonsteroidal anti-inflammatory medications, such as aspirin and ibuprofen, or other minor pain relievers, such as acetaminophen, are often recommended. Do not take pain medication within 7 days before surgery. Prescription pain relievers may be given if deemed necessary by your caregiver. Use only as directed and only as much as you need. Corticosteroid injections may be given by your caregiver. These injections should be reserved for the most serious cases, because they may only be given a certain number of times.  HEAT AND COLD Cold treatment (icing) relieves pain and reduces inflammation. Cold treatment should be applied for 10 to 15 minutes every 2 to 3 hours for inflammation and pain and immediately after any activity that aggravates your symptoms. Use ice packs or massage the area with a piece of ice (ice massage). Heat treatment may be used prior to performing the stretching and strengthening activities prescribed by your caregiver, physical therapist, or athletic trainer. Use a heat pack or soak the injury in warm water.  SEEK IMMEDIATE MEDICAL CARE IF: Treatment seems to offer no benefit, or the condition worsens. Any medications produce adverse side effects.  EXERCISES- RANGE OF MOTION (ROM) AND STRETCHING EXERCISES - Plantar Fasciitis (Heel Spur Syndrome) These exercises may help you when beginning to rehabilitate your injury. Your  symptoms may resolve with or without further involvement from your physician, physical therapist or athletic trainer. While completing these exercises, remember:  Restoring tissue flexibility helps normal motion to return to the joints. This allows healthier, less painful movement and activity. An effective stretch should be held for at least 30 seconds. A stretch should never be painful. You should only feel a gentle lengthening or release in the stretched tissue.  RANGE OF MOTION - Toe Extension, Flexion Sit with your right / left leg crossed over your opposite knee. Grasp your toes and gently pull them back toward the top of your foot. You should feel a stretch on the bottom of your toes and/or foot. Hold this stretch for 10 seconds. Now, gently pull your toes toward the bottom of your foot. You should feel a stretch on the top of your toes and or foot. Hold this stretch for 10 seconds. Repeat  times. Complete this stretch 3 times per day.   RANGE OF MOTION - Ankle Dorsiflexion, Active Assisted Remove shoes and sit on a chair that is preferably not on a carpeted surface. Place right / left foot under knee. Extend your opposite leg for support. Keeping your heel down, slide your right / left foot back toward the chair until you feel a stretch at your ankle or calf. If you do not feel a stretch, slide your bottom forward to the edge of the chair, while still keeping your heel down. Hold this stretch for 10 seconds. Repeat 3 times. Complete this stretch 2 times per day.   STRETCH  Gastroc, Standing Place hands on wall. Extend right / left leg, keeping the front knee somewhat bent. Slightly point your toes inward on your back foot. Keeping your right / left heel on the floor and your knee straight, shift your weight toward the wall, not allowing your back to arch. You should feel a gentle stretch in the right / left calf. Hold this position for 10 seconds. Repeat 3 times. Complete this  stretch 2 times per day.  STRETCH  Soleus, Standing Place hands on wall. Extend right / left leg, keeping the other knee somewhat bent. Slightly point your toes inward on your back foot. Keep your right / left heel on the floor, bend your back knee, and slightly shift your weight over the back leg so that you feel a gentle stretch deep in your back calf. Hold this position for 10 seconds. Repeat 3 times. Complete this stretch 2 times per day.  STRETCH  Gastrocsoleus, Standing  Note: This exercise can place a lot of stress on your foot and ankle. Please complete this exercise only if specifically instructed by your caregiver.  Place the ball of your right / left foot on a step, keeping your other foot firmly on the same step. Hold on to the wall or a rail for balance. Slowly lift your other foot, allowing your body weight to press your heel down over the edge of the step. You should feel a stretch in your right / left calf. Hold this position for 10 seconds. Repeat this exercise with a slight bend in your right / left knee. Repeat 3 times. Complete this stretch 2 times per day.   STRENGTHENING EXERCISES - Plantar Fasciitis (Heel Spur Syndrome)  These exercises may help you when beginning to rehabilitate your injury. They may resolve your symptoms with or without further involvement from your physician, physical therapist or athletic trainer. While completing these exercises, remember:  Muscles can  gain both the endurance and the strength needed for everyday activities through controlled exercises. Complete these exercises as instructed by your physician, physical therapist or athletic trainer. Progress the resistance and repetitions only as guided.  STRENGTH - Towel Curls Sit in a chair positioned on a non-carpeted surface. Place your foot on a towel, keeping your heel on the floor. Pull the towel toward your heel by only curling your toes. Keep your heel on the floor. Repeat 3 times.  Complete this exercise 2 times per day.  STRENGTH - Ankle Inversion Secure one end of a rubber exercise band/tubing to a fixed object (table, pole). Loop the other end around your foot just before your toes. Place your fists between your knees. This will focus your strengthening at your ankle. Slowly, pull your big toe up and in, making sure the band/tubing is positioned to resist the entire motion. Hold this position for 10 seconds. Have your muscles resist the band/tubing as it slowly pulls your foot back to the starting position. Repeat 3 times. Complete this exercises 2 times per day.  Document Released: 03/11/2005 Document Revised: 06/03/2011 Document Reviewed: 06/23/2008 ExitCare Patient Information 2014 ExitCare, [place].  ----   Pregabalin Capsules What is this medication? PREGABALIN (pre GAB a lin) treats nerve pain. It may also be used to prevent and control seizures in people with epilepsy. It works by calming overactive nerves in your body. This medicine may be used for other purposes; ask your health care provider or pharmacist if you have questions. COMMON BRAND NAME(S): Lyrica What should I tell my care team before I take this medication? They need to know if you have any of these conditions: Heart failure Kidney disease Lung disease Substance use disorder Suicidal thoughts, plans or attempt by you or a family member An unusual or allergic reaction to pregabalin, other medications, foods, dyes, or preservatives Pregnant or trying to get pregnant Breast-feeding How should I use this medication? Take this medication by mouth with water. Take it as directed on the prescription label at the same time every day. You can take it with or without food. If it upsets your stomach, take it with food. Keep taking it unless your care team tells you to stop. A special MedGuide will be given to you by the pharmacist with each prescription and refill. Be sure to read this information  carefully each time. Talk to your care team about the use of this medication in children. While it may be prescribed for children as young as 1 month for selected conditions, precautions do apply. Overdosage: If you think you have taken too much of this medicine contact a poison control center or emergency room at once. NOTE: This medicine is only for you. Do not share this medicine with others. What if I miss a dose? If you miss a dose, take it as soon as you can. If it is almost time for your next dose, take only that dose. Do not take double or extra doses. What may interact with this medication? This medication may interact with the following: Alcohol Antihistamines for allergy, cough, and cold Certain medications for anxiety or sleep Certain medications for blood pressure, heart disease Certain medications for depression like amitriptyline, fluoxetine, sertraline Certain medications for diabetes, like pioglitazone, rosiglitazone Certain medications for seizures like phenobarbital, primidone General anesthetics like halothane, isoflurane, methoxyflurane, propofol Medications that relax muscles for surgery Opioid medications for pain Phenothiazines like chlorpromazine, mesoridazine, prochlorperazine, thioridazine This list may not describe all possible interactions.  Give your health care provider a list of all the medicines, herbs, non-prescription drugs, or dietary supplements you use. Also tell them if you smoke, drink alcohol, or use illegal drugs. Some items may interact with your medicine. What should I watch for while using this medication? Visit your care team for regular checks on your progress. Tell your care team if your symptoms do not start to get better or if they get worse. Do not suddenly stop taking this medication. You may develop a severe reaction. Your care team will tell you how much medication to take. If your care team wants you to stop the medication, the dose may be  slowly lowered over time to avoid any side effects. This medication may affect your coordination, reaction time, or judgment. Do not drive or operate machinery until you know how this medication affects you. Sit up or stand slowly to reduce the risk of dizzy or fainting spells. Drinking alcohol with this medication can increase the risk of these side effects. If you or your family notice any changes in your behavior, such as new or worsening depression, thoughts of harming yourself, anxiety, other unusual or disturbing thoughts, or memory loss, call your care team right away. Wear a medical ID bracelet or chain if you are taking this medication for seizures. Carry a card that describes your condition. List the medications and doses you take on the card. This medication may make it more difficult to father a child. Talk to your care team if you are concerned about your fertility. What side effects may I notice from receiving this medication? Side effects that you should report to your care team as soon as possible: Allergic reactions or angioedema--skin rash, itching, hives, swelling of the face, eyes, lips, tongue, arms, or legs, trouble swallowing or breathing Blurry vision Thoughts of suicide or self-harm, worsening mood, feelings of depression Trouble breathing Side effects that usually do not require medical attention (report to your care team if they continue or are bothersome): Dizziness Drowsiness Dry mouth Nausea Swelling of the ankles, feet, hands Vomiting Weight gain This list may not describe all possible side effects. Call your doctor for medical advice about side effects. You may report side effects to FDA at 1-800-FDA-1088. Where should I keep my medication? Keep out of the reach of children and pets. This medication can be abused. Keep it in a safe place to protect it from theft. Do not share it with anyone. It is only for you. Selling or giving away this medication is dangerous  and against the law. Store at [employer] [address] (77 degrees F). Get rid of any unused medication after the expiration date. This medication may cause harm and death if it is taken by other adults, children, or pets. It is important to get rid of the medication as soon as you no longer need it, or it is expired. You can do this in two ways: Take the medication to a medication take-back program. Check with your pharmacy or law enforcement to find a location. If you cannot return the medication, check the label or package insert to see if the medication should be thrown out in the garbage or flushed down the toilet. If you are not sure, ask your care team. If it is safe to put it in the trash, take the medication out of the container. Mix the medication with cat litter, dirt, coffee grounds, or other unwanted substance. Seal the mixture in a bag or container. Put it  in the trash. NOTE: This sheet is a summary. It may not cover all possible information. If you have questions about this medicine, talk to your doctor, pharmacist, or health care provider.  2023 Elsevier/Gold Standard (2020-12-01 00:00:00)

## 2022-06-04 NOTE — Progress Notes (Signed)
Subjective: Chief Complaint  Patient presents with   routine foot care     Patient was put on gabapentin for the neuropathy, stated is ineffective     62 year old female presents the office with above concerns.  She states that she is doing numbness and tingling to her feet.  Her heels hurt to put down so she has to put a pillow under. No recent injuries. The heels hurt at night mostly.    Objective: AAO x3, NAD DP/PT pulses palpable bilaterally, CRT less than 3 seconds Sensation decreased with Semmes Weinstein monofilament. Mild discomfort on plantar aspect of the heels.  There is no pain upon compression of calcaneus.  There is no pain with Achilles tendon.  There is no area pinpoint tenderness.  No edema, erythema.  Flexor, extensor tendons appear to be intact.  MMT 5/5. No pain with calf compression, swelling, warmth, erythema  Assessment: 62 year old female with neuropathy, heel pain  Plan: -All treatment options discussed with the patient including all alternatives, risks, complications.  -X-rays were obtained and reviewed bilaterally.  3 views of the feet were obtained.  No evidence of acute fracture.  Calcaneal spurring is present. -Again reviewed the arterial studies which are normal. -We discussed the Lyrica dose.  Since he is having muscular discomfort at nighttime we will try to change the way we take Lyrica 75 mg twice a day.  If needed can increase this dose. -The heel wound discussed shoes with good arch support.  Discussed stretching exercises daily.  Discussed offloading. -Patient encouraged to call the office with any questions, concerns, change in symptoms.   Trula Slade DPM

## 2022-06-07 DIAGNOSIS — Z79899 Other long term (current) drug therapy: Secondary | ICD-10-CM | POA: Diagnosis not present

## 2022-06-07 DIAGNOSIS — E213 Hyperparathyroidism, unspecified: Secondary | ICD-10-CM | POA: Diagnosis not present

## 2022-06-07 DIAGNOSIS — M545 Low back pain, unspecified: Secondary | ICD-10-CM | POA: Diagnosis not present

## 2022-06-07 DIAGNOSIS — Z6827 Body mass index (BMI) 27.0-27.9, adult: Secondary | ICD-10-CM | POA: Diagnosis not present

## 2022-06-07 DIAGNOSIS — E119 Type 2 diabetes mellitus without complications: Secondary | ICD-10-CM | POA: Diagnosis not present

## 2022-06-07 DIAGNOSIS — Z6826 Body mass index (BMI) 26.0-26.9, adult: Secondary | ICD-10-CM | POA: Diagnosis not present

## 2022-06-07 DIAGNOSIS — R030 Elevated blood-pressure reading, without diagnosis of hypertension: Secondary | ICD-10-CM | POA: Diagnosis not present

## 2022-06-12 ENCOUNTER — Ambulatory Visit (INDEPENDENT_AMBULATORY_CARE_PROVIDER_SITE_OTHER): Payer: Medicaid Other | Admitting: Clinical

## 2022-06-12 DIAGNOSIS — F321 Major depressive disorder, single episode, moderate: Secondary | ICD-10-CM | POA: Diagnosis not present

## 2022-06-12 DIAGNOSIS — F411 Generalized anxiety disorder: Secondary | ICD-10-CM

## 2022-06-12 NOTE — Progress Notes (Signed)
IN PERSON   I connected with Charlotte Perez on 06/12/22 at  11:00 AM EST in person and verified that I am speaking with the correct person using two identifiers.   Location: Patient: Office Provider: Office   I discussed the limitations of evaluation and management by telemedicine and the availability of in person appointments. The patient expressed understanding and agreed to proceed.(IN PERSON)     THERAPIST PROGRESS NOTE   Session Time: 11:00 AM-11:45 AM   Participation Level: Active   Behavioral Response: CasualAlertDepressed   Type of Therapy: Individual Therapy   Treatment Goals addressed: Coping   Interventions: CBT, DBT, Solution Focused, Strength-based and Supportive   Summary: Charlotte Perez is a 62 y.o. female who presents with Depression and GAD. The OPT therapist worked with the patient for her OPT treatment. The OPT therapist utilized Motivational Interviewing to assist in creating therapeutic repore. The patient in the session was engaged and work in Science writer. The patient spoke about ongoing interactions with her husband both the positives and negatives from her perspective. The OPT therapist utilized Cognitive Behavioral Therapy through cognitive restructuring as well as worked with the patient on coping strategies to assist in management of mood including ongoing motivation and empowerment for the patient to be as active as possible with her health limitations. The patient spoke about her plans for the future with her partner and her hopeto go on vacation again soon to [name] this time as she went to [name] in the last trip.  The OPT therapist continued work with the patient on coping strategies and the patient spoke about her willingness to do her self check ins and use her coping skills/outlets as needed moving forward. The OPT therapist worked with the patient in session on communication tips and strategies with relationship. The  OPT therapist overviewed all upcoming appointments from the patient Charlotte Perez.   Suicidal/Homicidal: Nowithout intent/plan   Therapist Response: The OPT therapist worked with the patient for the patients scheduled session. The patient was engaged in her session and gave feedback in relation to triggers, symptoms, and behavior responses over the past few weeks.The OPT therapist worked with the patient utilizing an in session Cognitive Behavioral Therapy exercise. The patient was responsive in the session and verbalized, " Charlotte Perez credit for being willing to go out of the house and go more places with me , but I also want my own freedom and to be able to go out with friends more ". The OPT therapist worked with the patient on work towards consistency and stability and utilizing coping strategies as needed as she has reunited and been back with her husband.  The OPT therapist worked with the patient in the session on communication, partnership , and compromise within the relationship with her partner.The OPT therapist overviewed with the patient her med therapy with upcoming med follow up appointment for tomorrow with [name] and the patient indicated she felt her medicine is working really well currently.  The OPT therapist will continue treatment work with the patient in her next scheduled session.      Plan: Return again in 2 weeks.   Diagnosis:      Axis I: Moderate single episode of Major Depressive Disorder / GAD                           Axis II: No diagnosis     Collaboration of Care: No additional  collaboration of care for this session   Patient/Guardian was advised Release of Information must be obtained prior to any record release in order to collaborate their care with an outside provider. Patient/Guardian was advised if they have not already done so to contact the registration department to sign all necessary forms in order for [place] to release information regarding their care.    Consent:  Patient/Guardian gives verbal consent for treatment and assignment of benefits for services provided during this visit. Patient/Guardian expressed understanding and agreed to proceed.    I discussed the assessment and treatment plan with the patient. The patient was provided an opportunity to ask questions and all were answered. The patient agreed with the plan and demonstrated an understanding of the instructions.   The patient was advised to call back or seek an in-person evaluation if the symptoms worsen or if the condition fails to improve as anticipated.   I provided 45 minutes of face-to-face time during this encounter.   Lennox Grumbles, LCSW   06/12/2022

## 2022-07-01 ENCOUNTER — Other Ambulatory Visit: Payer: Self-pay | Admitting: Family Medicine

## 2022-07-01 DIAGNOSIS — E1165 Type 2 diabetes mellitus with hyperglycemia: Secondary | ICD-10-CM

## 2022-07-01 MED ORDER — METFORMIN HCL 1000 MG PO TABS: 1000.0000 mg | ORAL_TABLET | Freq: Two times a day (BID) | ORAL | 1 refills | Status: DC

## 2022-07-01 NOTE — Telephone Encounter (Signed)
Please advise Previous request did not come into [place] Pt's Last OV 05/15/21 Last RF per chart 08/31/20 by Bjorn Pippin Np Next OV 08/26/22

## 2022-07-01 NOTE — Telephone Encounter (Signed)
  Prescription Request  07/01/2022  Is this a "Controlled Substance" medicine? no  Have you seen your PCP in the last 2 weeks? no  If YES, route message to pool  -  If NO, patient needs to be scheduled for appointment.  What is the name of the medication or equipment? Metformin 1000 mg. Patient will be out today and has appt for 3 month 6-3 and states pharmacy said it was denied,  Have you contacted your pharmacy to request a refill? yes   Which pharmacy would you like this sent to? [facility]   Patient notified that their request is being sent to the clinical staff for review and that they should receive a response within 2 business days.

## 2022-07-02 ENCOUNTER — Encounter (INDEPENDENT_AMBULATORY_CARE_PROVIDER_SITE_OTHER): Payer: Self-pay | Admitting: Gastroenterology

## 2022-07-02 DIAGNOSIS — K529 Noninfective gastroenteritis and colitis, unspecified: Secondary | ICD-10-CM | POA: Diagnosis not present

## 2022-07-02 DIAGNOSIS — R0789 Other chest pain: Secondary | ICD-10-CM | POA: Diagnosis not present

## 2022-07-02 DIAGNOSIS — R109 Unspecified abdominal pain: Secondary | ICD-10-CM | POA: Diagnosis not present

## 2022-07-02 NOTE — Telephone Encounter (Signed)
LMOVM refill sent to pharmacy 

## 2022-07-03 ENCOUNTER — Telehealth: Payer: Self-pay

## 2022-07-03 NOTE — Transitions of Care (Post Inpatient/ED Visit) (Signed)
   07/03/2022  Name: Charlotte Perez MRN: [id number] DOB: [birth year]  Today's TOC FU Call Status: Today's TOC FU Call Status:: Successful TOC FU Call Competed TOC FU Call Complete Date: 07/03/22  Transition Care Management Follow-up Telephone Call Date of Discharge: 07/02/22 Discharge Facility: Other ([facility]) Name of Other ([facility]) Discharge Facility: [facility] Type of Discharge: Emergency Department Reason for ED Visit: Other: (gastroenteritis) How have you been since you were released from the hospital?: Same Any questions or concerns?: No  Items Reviewed: Did you receive and understand the discharge instructions provided?: Yes Medications obtained and verified?: Yes (Medications Reviewed) Any new allergies since your discharge?: No Dietary orders reviewed?: Yes Do you have support at home?: No  Home Care and Equipment/Supplies: Were Home Health Services Ordered?: NA Any new equipment or medical supplies ordered?: NA  Functional Questionnaire: Do you need assistance with bathing/showering or dressing?: No Do you need assistance with meal preparation?: No Do you need assistance with eating?: No Do you have difficulty maintaining continence: No Do you need assistance with getting out of bed/getting out of a chair/moving?: No Do you have difficulty managing or taking your medications?: No  Follow up appointments reviewed: PCP Follow-up appointment confirmed?: NA Specialist Hospital Follow-up appointment confirmed?: NA Do you need transportation to your follow-up appointment?: No Do you understand care options if your condition(s) worsen?: Yes-patient verbalized understanding    SIGNATURE Karena Addison, LPN [facility] Nurse Health Advisor Direct Dial [phone]

## 2022-07-08 DIAGNOSIS — R030 Elevated blood-pressure reading, without diagnosis of hypertension: Secondary | ICD-10-CM | POA: Diagnosis not present

## 2022-07-08 DIAGNOSIS — E119 Type 2 diabetes mellitus without complications: Secondary | ICD-10-CM | POA: Diagnosis not present

## 2022-07-08 DIAGNOSIS — M545 Low back pain, unspecified: Secondary | ICD-10-CM | POA: Diagnosis not present

## 2022-07-08 DIAGNOSIS — Z6826 Body mass index (BMI) 26.0-26.9, adult: Secondary | ICD-10-CM | POA: Diagnosis not present

## 2022-07-08 DIAGNOSIS — E213 Hyperparathyroidism, unspecified: Secondary | ICD-10-CM | POA: Diagnosis not present

## 2022-07-08 DIAGNOSIS — Z79899 Other long term (current) drug therapy: Secondary | ICD-10-CM | POA: Diagnosis not present

## 2022-07-09 ENCOUNTER — Other Ambulatory Visit: Payer: Medicaid Other | Admitting: Obstetrics & Gynecology

## 2022-07-10 ENCOUNTER — Other Ambulatory Visit (HOSPITAL_COMMUNITY)
Admission: RE | Admit: 2022-07-10 | Discharge: 2022-07-10 | Disposition: A | Discharge status: Home / Self Care | Payer: Medicaid Other | Source: Ambulatory Visit | Attending: Obstetrics & Gynecology | Admitting: Obstetrics & Gynecology

## 2022-07-10 ENCOUNTER — Encounter: Payer: Self-pay | Admitting: Obstetrics & Gynecology

## 2022-07-10 ENCOUNTER — Ambulatory Visit (INDEPENDENT_AMBULATORY_CARE_PROVIDER_SITE_OTHER): Payer: Medicaid Other | Admitting: Obstetrics & Gynecology

## 2022-07-10 VITALS — BP 101/66 | HR 86 | Ht 58.5 in | Wt 140.0 lb

## 2022-07-10 DIAGNOSIS — L309 Dermatitis, unspecified: Secondary | ICD-10-CM | POA: Insufficient documentation

## 2022-07-10 DIAGNOSIS — N898 Other specified noninflammatory disorders of vagina: Secondary | ICD-10-CM | POA: Diagnosis not present

## 2022-07-10 MED ORDER — TRIAMCINOLONE ACETONIDE 0.5 % EX OINT
TOPICAL_OINTMENT | CUTANEOUS | 1 refills | Status: DC
Start: 2022-07-10 — End: 2022-10-16

## 2022-07-10 NOTE — Progress Notes (Signed)
   GYN VISIT Patient name: Charlotte Perez MRN [id number]  Date of birth: 08-28-1960 Chief Complaint:   Gynecologic Exam  History of Present Illness:   Charlotte Perez is a 62 y.o. G75P3003 PH female being seen today for the following concerns:   -Vaginal itching- about 1 mos, white discharge, maybe slight odor.  Irritation is mostly on the outside.  She notes that she finds herself constantly scratching due to the itch.  Patient is not sexually active denies vaginal dryness.  No LMP recorded. Patient has had a hysterectomy.     07/10/2022   11:38 AM 05/15/2022    8:42 AM 05/09/2022    8:47 AM 04/05/2022    9:23 AM 03/06/2022    9:39 AM  Depression screen PHQ 2/9  Decreased Interest Down, Depressed, Hopeless PHQ - 2 Score Altered sleeping 0  Tired, decreased energy Change in appetite 0 Feeling bad or failure about yourself  Trouble concentrating 0 Moving slowly or fidgety/restless 0 0  0 0  Suicidal thoughts 0 0  0 0  PHQ-9 Score Difficult doing work/chores  Somewhat difficult  Somewhat difficult Not difficult at all     Information is confidential and restricted. Go to Review Flowsheets to unlock data.     Review of Systems:   Pertinent items are noted in HPI Denies fever/chills, dizziness, headaches, visual disturbances, fatigue, shortness of breath, chest pain, abdominal pain, vomiting, no bowel movements, urination, or intercourse unless otherwise stated above.  Pertinent History Reviewed:  Reviewed past medical,surgical, social, obstetrical and family history.  Reviewed problem list, medications and allergies. Physical Assessment:   Vitals:   07/10/22 1134  BP: 101/66  Pulse: 86  Weight: 140 lb (63.5 kg)  Height: 4' 10.5" (1.486 m)  Body mass index is 28.76 kg/m.       Physical Examination:   General appearance: alert, well appearing, and in no distress  Psych: mood  appropriate, normal affect  Skin: warm & dry   Cardiovascular: normal heart rate noted  Respiratory: normal respiratory effort, no distress  Abdomen: soft, non-tender   Pelvic: VULVA: normal appearing vulva-slight erythematous appearance with no masses, tenderness or lesions, VAGINA: Flat mucosa with no discharge or lesions.  Uterus and cervix surgically absent.  Vaginal cuff small area of mucus noted.  On bimanual exam no tenderness or masses appreciated  Extremities: no edema, no calf tenderness bilaterally  Chaperone: Faith Rogue    Assessment & Plan:  1) vulvar dermatitis -Vaginitis panel obtained to rule out underlying infection - Concern for dermatitis, reviewed conservative management -Ointment sent in,  plan to follow-up in 2 to 3 months   Return in about 3 months (around 10/09/2022) for med follow up Consetta Cosner.   Myna Hidalgo, DO Attending Obstetrician & Gynecologist, [facility] for [employer], [facility]

## 2022-07-11 ENCOUNTER — Encounter (INDEPENDENT_AMBULATORY_CARE_PROVIDER_SITE_OTHER): Payer: Self-pay | Admitting: Gastroenterology

## 2022-07-11 ENCOUNTER — Other Ambulatory Visit: Payer: Self-pay | Admitting: Obstetrics & Gynecology

## 2022-07-11 DIAGNOSIS — B379 Candidiasis, unspecified: Secondary | ICD-10-CM

## 2022-07-11 DIAGNOSIS — Z79899 Other long term (current) drug therapy: Secondary | ICD-10-CM | POA: Diagnosis not present

## 2022-07-11 LAB — CERVICOVAGINAL ANCILLARY ONLY
Bacterial Vaginitis (gardnerella): NEGATIVE
Candida Glabrata: POSITIVE — AB
Candida Vaginitis: NEGATIVE
Comment: NEGATIVE
Comment: NEGATIVE
Comment: NEGATIVE

## 2022-07-11 MED ORDER — BORIC ACID VAGINAL 600 MG VA SUPP
600.0000 mg | Freq: Every evening | VAGINAL | 1 refills | Status: AC
Start: 2022-07-11 — End: 2022-07-25

## 2022-07-11 NOTE — Progress Notes (Signed)
Vaginal suppository for C. Everlene Farrier, DO Attending Obstetrician & Gynecologist, [facility] for [employer], [facility]

## 2022-07-25 ENCOUNTER — Ambulatory Visit (INDEPENDENT_AMBULATORY_CARE_PROVIDER_SITE_OTHER): Payer: Medicaid Other | Admitting: Clinical

## 2022-07-25 DIAGNOSIS — F321 Major depressive disorder, single episode, moderate: Secondary | ICD-10-CM

## 2022-07-25 DIAGNOSIS — F411 Generalized anxiety disorder: Secondary | ICD-10-CM | POA: Diagnosis not present

## 2022-07-25 NOTE — Progress Notes (Signed)
IN PERSON   I connected with Charlotte Perez on 07/25/22 at  11:00 AM EST in person and verified that I am speaking with the correct person using two identifiers.   Location: Patient: Office Provider: Office   I discussed the limitations of evaluation and management by telemedicine and the availability of in person appointments. The patient expressed understanding and agreed to proceed.(IN PERSON)     THERAPIST PROGRESS NOTE   Session Time: 11:00 AM-11:30 AM   Participation Level: Active   Behavioral Response: CasualAlertDepressed   Type of Therapy: Individual Therapy   Treatment Goals addressed: Coping   Interventions: CBT, DBT, Solution Focused, Strength-based and Supportive   Summary: Charlotte Sessler. Perez is a 62 y.o. female who presents with Depression and GAD. The OPT therapist worked with the patient for her OPT treatment. The OPT therapist utilized Motivational Interviewing to assist in creating therapeutic repore. The patient in the session was engaged and work in Tour manager. The patient spoke about ongoing interactions with her husband both the positives and negatives from her perspective. The OPT therapist utilized Cognitive Behavioral Therapy through cognitive restructuring as well as worked with the patient on coping strategies to assist in management of mood including ongoing motivation and empowerment for the patient to be as active as possible with her health limitations. The patient spoke about her interactions with her partner and coming to an agreement where she will still be able to socialize without this creating a conflict within the relationship..  The OPT therapist continued work with the patient on coping strategies and the patient spoke about her willingness to do her self check ins and use her coping skills/outlets as needed moving forward. The OPT therapist worked with the patient in session on communication tips and strategies with relationship.  The OPT therapist overviewed all upcoming appointments from the patient MyChart.including upcoming med management with Dr. Tenny Craw 08/06/2022   Suicidal/Homicidal: Nowithout intent/plan   Therapist Response: The OPT therapist worked with the patient for the patients scheduled session. The patient was engaged in her session and gave feedback in relation to triggers, symptoms, and behavior responses over the past few weeks.The OPT therapist worked with the patient utilizing an in session Cognitive Behavioral Therapy exercise. The patient was responsive in the session and verbalized, " We were able to go and meet my friend and my husband got to meet him and I think that helped everything went well and so now I am able to communicate with my friend and it not cause conflict in my relationship ". The OPT therapist worked with the patient on work towards consistency and stability and utilizing coping strategies .  The OPT therapist worked with the patient in the session on communication, partnership , and compromise within the relationship with her partner.The OPT therapist overviewed with the patient her med therapy with upcoming med follow up appointment 08/06/2022 with Dr. Tenny Craw and the patient indicated she felt her medicine continues to help and work well with management of her MH symptoms.  The OPT therapist will continue treatment work with the patient in her next scheduled session.      Plan: Return again in 2 weeks.   Diagnosis:      Axis I: Moderate single episode of Major Depressive Disorder / GAD                           Axis II: No diagnosis     Collaboration  of Care: No additional collaboration of care for this session   Patient/Guardian was advised Release of Information must be obtained prior to any record release in order to collaborate their care with an outside provider. Patient/Guardian was advised if they have not already done so to contact the registration department to sign all necessary  forms in order for [place] to release information regarding their care.    Consent: Patient/Guardian gives verbal consent for treatment and assignment of benefits for services provided during this visit. Patient/Guardian expressed understanding and agreed to proceed.    I discussed the assessment and treatment plan with the patient. The patient was provided an opportunity to ask questions and all were answered. The patient agreed with the plan and demonstrated an understanding of the instructions.   The patient was advised to call back or seek an in-person evaluation if the symptoms worsen or if the condition fails to improve as anticipated.   I provided 45 minutes of face-to-face time during this encounter.   Charlotte Burn, LCSW   07/25/2022

## 2022-07-31 ENCOUNTER — Encounter: Payer: Self-pay | Admitting: Family Medicine

## 2022-07-31 ENCOUNTER — Telehealth (INDEPENDENT_AMBULATORY_CARE_PROVIDER_SITE_OTHER): Payer: Medicaid Other | Admitting: Family Medicine

## 2022-07-31 DIAGNOSIS — R300 Dysuria: Secondary | ICD-10-CM | POA: Diagnosis not present

## 2022-07-31 DIAGNOSIS — R399 Unspecified symptoms and signs involving the genitourinary system: Secondary | ICD-10-CM | POA: Diagnosis not present

## 2022-07-31 LAB — URINALYSIS, ROUTINE W REFLEX MICROSCOPIC
Bilirubin, UA: NEGATIVE
Ketones, UA: NEGATIVE
Nitrite, UA: NEGATIVE
Specific Gravity, UA: 1.025 (ref 1.005–1.030)
Urobilinogen, Ur: 0.2 mg/dL (ref 0.2–1.0)
pH, UA: 5.5 (ref 5.0–7.5)

## 2022-07-31 LAB — MICROSCOPIC EXAMINATION
Epithelial Cells (non renal): NONE SEEN /hpf (ref 0–10)
RBC, Urine: NONE SEEN /hpf (ref 0–2)
Renal Epithel, UA: NONE SEEN /hpf
WBC, UA: >30 /hpf — AB (ref 0–5)

## 2022-07-31 MED ORDER — CEPHALEXIN 500 MG PO CAPS
500.0000 mg | ORAL_CAPSULE | Freq: Two times a day (BID) | ORAL | 0 refills | Status: DC
Start: 2022-07-31 — End: 2022-08-26

## 2022-07-31 NOTE — Progress Notes (Signed)
   Virtual Visit via video Note   Due to COVID-19 pandemic this visit was conducted virtually. This visit type was conducted due to national recommendations for restrictions regarding the COVID-19 Pandemic (e.g. social distancing, sheltering in place) in an effort to limit this patient's exposure and mitigate transmission in our community. All issues noted in this document were discussed and addressed.  A physical exam was not performed with this format.  I connected with  Charlotte Perez  on 07/31/22 at 12:54 by video and verified that I am speaking with the correct person using two identifiers. Charlotte Perez is currently located at home and no one is currently with her during the visit. The provider, Gabriel Earing, FNP is located in their office at time of visit.  I discussed the limitations, risks, security and privacy concerns of performing an evaluation and management service by video  and the availability of in person appointments. I also discussed with the patient that there may be a patient responsible charge related to this service. The patient expressed understanding and agreed to proceed.  CC: UTI symtpoms  History and Present Illness:  Urinary Tract Infection: Patient complains of burning with urination, frequency, and incomplete bladder emptying She has had symptoms for 2 days. Patient also complains of back pain. Patient denies fever, chills, nausea, vomiting, hematuria, congestion, cough, fever, headache, rhinitis, sorethroat, stomach ache, and vaginal discharge. Patient does not have a history of recurrent UTI.  Patient does not have a history of pyelonephritis.     ROS As per HPI.     Observations/Objective: Alert and oriented. Respirations unlabored. No cyanosis. Non toxic appearing. Normal mood and behavior.   Urine dipstick shows positive for RBC's, positive for protein, positive for glucose, and positive for leukocytes.  Micro exam: >30 WBC's per HPF, 0 RBC's per  HPF, and few + bacteria.   Assessment and Plan: Charlotte Perez was seen today for urinary tract infection.  Diagnoses and all orders for this visit:  Dysuria Keflex ordered as below pending culture. Push fluids. Strict return precautions given.  -     Urinalysis, Routine w reflex microscopic -     cephALEXin (KEFLEX) 500 MG capsule; Take 1 capsule (500 mg total) by mouth 2 (two) times daily. -     Urine Culture     Follow Up Instructions: As needed.     I discussed the assessment and treatment plan with the patient. The patient was provided an opportunity to ask questions and all were answered. The patient agreed with the plan and demonstrated an understanding of the instructions.   The patient was advised to call back or seek an in-person evaluation if the symptoms worsen or if the condition fails to improve as anticipated.  The above assessment and management plan was discussed with the patient. The patient verbalized understanding of and has agreed to the management plan. Patient is aware to call the clinic if symptoms persist or worsen. Patient is aware when to return to the clinic for a follow-up visit. Patient educated on when it is appropriate to go to the emergency department.   Time call ended: 1259  I provided 5 minutes of face-to-face time during this encounter.    Gabriel Earing, FNP

## 2022-08-01 ENCOUNTER — Telehealth: Payer: Self-pay

## 2022-08-01 ENCOUNTER — Other Ambulatory Visit (HOSPITAL_COMMUNITY): Payer: Self-pay

## 2022-08-01 NOTE — Telephone Encounter (Signed)
Patient Advocate Encounter  Prior Authorization for Jardiance 25MG  tablets has been approved with [employer].    PA# [id number] Effective dates: 08/01/22 through 08/01/23

## 2022-08-01 NOTE — Telephone Encounter (Signed)
Satira Sark (KeyMatilde Bash) PA Case ID #: [id number] Rx #: [id number] Need Help? Call us at [phone] Status sent iconSent to Plan today Drug Jardiance 25MG  tablets ePA cloud logo Form [employer] Electronic Prior Authorization Form [phone] NCPDP)

## 2022-08-02 LAB — URINE CULTURE

## 2022-08-06 ENCOUNTER — Encounter (HOSPITAL_COMMUNITY): Payer: Self-pay | Admitting: Psychiatry

## 2022-08-06 ENCOUNTER — Ambulatory Visit (INDEPENDENT_AMBULATORY_CARE_PROVIDER_SITE_OTHER): Payer: Medicaid Other | Admitting: Psychiatry

## 2022-08-06 VITALS — BP 114/76 | HR 88 | Ht 58.5 in | Wt 140.2 lb

## 2022-08-06 DIAGNOSIS — F411 Generalized anxiety disorder: Secondary | ICD-10-CM | POA: Diagnosis not present

## 2022-08-06 DIAGNOSIS — F321 Major depressive disorder, single episode, moderate: Secondary | ICD-10-CM

## 2022-08-06 LAB — URINE CULTURE

## 2022-08-06 MED ORDER — TRAZODONE HCL 50 MG PO TABS: 50.0000 mg | ORAL_TABLET | Freq: Every day | ORAL | 2 refills | Status: DC

## 2022-08-06 MED ORDER — VENLAFAXINE HCL ER 150 MG PO CP24: 300.0000 mg | ORAL_CAPSULE | Freq: Every day | ORAL | 2 refills | Status: DC

## 2022-08-06 MED ORDER — ARIPIPRAZOLE 2 MG PO TABS: 2.0000 mg | ORAL_TABLET | Freq: Every day | ORAL | 2 refills | Status: DC
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
## 2022-08-08 ENCOUNTER — Other Ambulatory Visit (HOSPITAL_COMMUNITY): Payer: Self-pay | Admitting: Psychiatry

## 2022-08-08 ENCOUNTER — Telehealth (HOSPITAL_COMMUNITY): Payer: Self-pay

## 2022-08-08 MED ORDER — ALPRAZOLAM 1 MG PO TABS: 1.0000 mg | ORAL_TABLET | Freq: Every evening | ORAL | 2 refills | Status: DC | PRN

## 2022-08-08 NOTE — Telephone Encounter (Signed)
Spoke with pt advised of Dr Charlott Rakes message she verbalized understanding

## 2022-08-08 NOTE — Telephone Encounter (Signed)
Pt called in stating that traZODone (DESYREL) 50 MG tablet is making right hand numb, stomach cramps, and itching. She states that it started last night after taking 2nd dose. Pt is wanting to know if she can get back on her xanax. Please advise.

## 2022-08-14 ENCOUNTER — Other Ambulatory Visit: Payer: Self-pay | Admitting: Podiatry

## 2022-08-15 DIAGNOSIS — Z79899 Other long term (current) drug therapy: Secondary | ICD-10-CM | POA: Diagnosis not present

## 2022-08-15 DIAGNOSIS — M545 Low back pain, unspecified: Secondary | ICD-10-CM | POA: Diagnosis not present

## 2022-08-15 DIAGNOSIS — Z6826 Body mass index (BMI) 26.0-26.9, adult: Secondary | ICD-10-CM | POA: Diagnosis not present

## 2022-08-15 DIAGNOSIS — E213 Hyperparathyroidism, unspecified: Secondary | ICD-10-CM | POA: Diagnosis not present

## 2022-08-15 DIAGNOSIS — E119 Type 2 diabetes mellitus without complications: Secondary | ICD-10-CM | POA: Diagnosis not present

## 2022-08-15 DIAGNOSIS — R030 Elevated blood-pressure reading, without diagnosis of hypertension: Secondary | ICD-10-CM | POA: Diagnosis not present

## 2022-08-20 DIAGNOSIS — Z79899 Other long term (current) drug therapy: Secondary | ICD-10-CM | POA: Diagnosis not present

## 2022-08-26 ENCOUNTER — Ambulatory Visit (INDEPENDENT_AMBULATORY_CARE_PROVIDER_SITE_OTHER): Payer: Medicaid Other | Admitting: Family Medicine

## 2022-08-26 ENCOUNTER — Encounter: Payer: Self-pay | Admitting: Family Medicine

## 2022-08-26 VITALS — BP 103/70 | HR 81 | Temp 98.3°F | Ht 58.5 in | Wt 139.2 lb

## 2022-08-26 DIAGNOSIS — J4489 Other specified chronic obstructive pulmonary disease: Secondary | ICD-10-CM | POA: Diagnosis not present

## 2022-08-26 DIAGNOSIS — E1159 Type 2 diabetes mellitus with other circulatory complications: Secondary | ICD-10-CM | POA: Diagnosis not present

## 2022-08-26 DIAGNOSIS — R0683 Snoring: Secondary | ICD-10-CM

## 2022-08-26 DIAGNOSIS — E785 Hyperlipidemia, unspecified: Secondary | ICD-10-CM | POA: Diagnosis not present

## 2022-08-26 DIAGNOSIS — I152 Hypertension secondary to endocrine disorders: Secondary | ICD-10-CM

## 2022-08-26 DIAGNOSIS — Z23 Encounter for immunization: Secondary | ICD-10-CM | POA: Diagnosis not present

## 2022-08-26 DIAGNOSIS — R42 Dizziness and giddiness: Secondary | ICD-10-CM

## 2022-08-26 DIAGNOSIS — E1165 Type 2 diabetes mellitus with hyperglycemia: Secondary | ICD-10-CM | POA: Diagnosis not present

## 2022-08-26 DIAGNOSIS — Z7984 Long term (current) use of oral hypoglycemic drugs: Secondary | ICD-10-CM

## 2022-08-26 DIAGNOSIS — K219 Gastro-esophageal reflux disease without esophagitis: Secondary | ICD-10-CM | POA: Diagnosis not present

## 2022-08-26 DIAGNOSIS — R400 Somnolence: Secondary | ICD-10-CM | POA: Diagnosis not present

## 2022-08-26 DIAGNOSIS — E1169 Type 2 diabetes mellitus with other specified complication: Secondary | ICD-10-CM | POA: Diagnosis not present

## 2022-08-26 LAB — CBC WITH DIFFERENTIAL/PLATELET
Basophils Absolute: 0.1 10*3/uL (ref 0.0–0.2)
EOS (ABSOLUTE): 0.3 10*3/uL (ref 0.0–0.4)
Eos: 5 %
Immature Granulocytes: 0 %
Lymphocytes Absolute: 1.4 10*3/uL (ref 0.7–3.1)
Neutrophils Absolute: 2.6 10*3/uL (ref 1.4–7.0)

## 2022-08-26 LAB — BMP8+EGFR

## 2022-08-26 LAB — BAYER DCA HB A1C WAIVED: HB A1C (BAYER DCA - WAIVED): 7.1 % — ABNORMAL HIGH (ref 4.8–5.6)

## 2022-08-26 MED ORDER — OMEPRAZOLE 40 MG PO CPDR
40.0000 mg | DELAYED_RELEASE_CAPSULE | Freq: Every day | ORAL | 3 refills | Status: AC
Start: 2022-08-26 — End: ?

## 2022-08-26 MED ORDER — OMEPRAZOLE 40 MG PO CPDR
40.0000 mg | DELAYED_RELEASE_CAPSULE | Freq: Every day | ORAL | 3 refills | Status: DC
Start: 2022-08-26 — End: 2022-08-26

## 2022-08-26 MED ORDER — SITAGLIPTIN PHOSPHATE 100 MG PO TABS
100.0000 mg | ORAL_TABLET | Freq: Every day | ORAL | 3 refills | Status: DC
Start: 2022-08-26 — End: 2022-11-27

## 2022-08-26 NOTE — Progress Notes (Signed)
Established Patient Office Visit  Subjective   Patient ID: Charlotte Perez, female    DOB: 02-18-1961  Age: 62 y.o. MRN: [id number]  Chief Complaint  Patient presents with   Medical Management of Chronic Issues   Diabetes    HPI T2DM Pt presents for follow up evaluation of Type 2 diabetes mellitus. Patient denies foot ulcerations, increased appetite, nausea, polydipsia, polyuria, visual disturbances, vomiting, and weight loss.  Current diabetic medications include jardiance, januvia, metformin Compliant with meds - Yes  Current monitoring regimen:  2-3x a week Home blood sugar records: fasting range: 130s Any episodes of hypoglycemia? no  Current diet:  regular, since reports diet has not been good lately Current exercise: none  Is She on ACE inhibitor or angiotensin II receptor blocker?  Yes, lisinopril Is She on statin? Yes atorvastatin  She will be doing seeing podiatry tomorrow.   2. HTN Complaint with meds - Yes Current Medications - lisinopril Pertinent ROS:  Headache - No Fatigue - No Visual Disturbances - No Chest pain - No Dyspnea - No Palpitations - No LE edema - No  She reports dizziness with standing. This last for a few minutes after. She feels lightheaded when this happens. She drinks a lot of water. She doesn't drink caffeine.   3. HLD On atorvastatin. Regular diet. No exercise. Denies side effects.   4. GERD Compliant with medications - No Current medications - omeprazole. She has been out of this for a few weeks Sore throat - No Voice change - No Hemoptysis - No Dysphagia or dyspepsia - No Water brash - Yes Red Flags (weight loss, hematochezia, melena, weight loss, early satiety, fevers, odynophagia, or persistent vomiting) - No  Reports intermittent heartburn.   5. COPD Reports stable. She has been using breztri daily with some improvement in symptoms. She is using her albuterol nebulizer once a day on average with good relief.   6.  Sleep apnea? Continues snore loudly at night and feel tired during the day. Didn't hear anything from sleep referral.   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    Objective:     BP 103/70   Pulse 81   Temp 98.3 F (36.8 C) (Temporal)   Ht 4' 10.5" (1.486 m)   Wt 139 lb 4 oz (63.2 kg)   SpO2 96%   BMI 28.61 kg/m  BP Readings from Last 3 Encounters:  08/26/22 103/70  07/10/22 101/66  05/15/22 111/68   Orthostatic VS for the past 72  hrs (Last 3 readings):  Orthostatic BP  08/26/22 0937 115/80  08/26/22 0936 118/82  08/26/22 0934 115/80    Physical Exam Vitals and nursing note reviewed.  Constitutional:      General: She is not in acute distress.    Appearance: Normal appearance. She is not ill-appearing, toxic-appearing or diaphoretic.  Neck:     Vascular: No carotid bruit.  Cardiovascular:     Rate and Rhythm: Normal rate and regular rhythm.     Pulses: Normal pulses.     Heart sounds: Normal heart sounds. No murmur heard. Pulmonary:     Effort: Pulmonary effort is normal. No respiratory distress.     Breath sounds: Normal breath sounds.  Abdominal:     General: Bowel sounds are  normal. There is no distension.     Palpations: Abdomen is soft. There is no mass.     Tenderness: There is no abdominal tenderness. There is no guarding or rebound.  Musculoskeletal:     Cervical back: Neck supple. No rigidity.     Right lower leg: No edema.     Left lower leg: No edema.  Skin:    General: Skin is warm and dry.  Neurological:     General: No focal deficit present.     Mental Status: She is alert and oriented to person, place, and time.  Psychiatric:        Mood and Affect: Mood normal.        Behavior: Behavior normal.        Thought Content: Thought content normal.        Judgment: Judgment normal.      No results found for any visits on 08/26/22.    The 10-year ASCVD risk score (Arnett DK, et al., 2019) is: 5.1%    Assessment & Plan:   Tybria was seen today for medical management of chronic issues and diabetes.  Diagnoses and all orders for this visit:  Type 2 diabetes mellitus with hyperglycemia, without long-term current use of insulin (HCC) A1c 7.1 today, not at goal of <7. Medication changes today: increase [place]. She is on an ACE/ARB and statin. Eye exam: reminded to schedule. Foot exam: UTD. Urine micro: UTD. Diet and exercise.  -     Bayer DCA Hb A1c Waived -     sitaGLIPtin (JANUVIA) 100 MG tablet; Take 1 tablet (100 mg total) by mouth daily.  Hypertension associated with diabetes (HCC) Well controlled on current regimen.   Dizziness Negative orthostatics. Will check labs as below today and determine plan of care pending lab results.  -     CBC with Differential/Platelet -     BMP8+EGFR -     TSH  Hyperlipidemia associated with type 2 diabetes mellitus (HCC) Last LDL was 77. Continue statin. Diet and exercise.   Gastroesophageal reflux disease without esophagitis -     omeprazole (PRILOSEC) 40 MG capsule; Take 1 capsule (40 mg total) by mouth daily. TAKE ONE (1) CAPSULE EACH DAY Strength: 40 mg  COPD with asthma Stable. Continue  breztri, albuterol.  -     Ambulatory referral to Sleep Studies  Daytime somnolence Loud snoring New referral for sleep study placed today.  -     Ambulatory referral to Sleep Studies  Need for vaccination -     Zoster Recombinant (Shingrix )   Return in about 3 months (around 11/26/2022) for chronic follow up. Sooner for new or worsening symptoms.   The patient indicates understanding of these  issues and agrees with the plan.  Gabriel Earing, FNP

## 2022-08-27 ENCOUNTER — Ambulatory Visit (INDEPENDENT_AMBULATORY_CARE_PROVIDER_SITE_OTHER): Payer: Medicaid Other | Admitting: Podiatry

## 2022-08-27 DIAGNOSIS — M7989 Other specified soft tissue disorders: Secondary | ICD-10-CM | POA: Diagnosis not present

## 2022-08-27 DIAGNOSIS — M71372 Other bursal cyst, left ankle and foot: Secondary | ICD-10-CM | POA: Diagnosis not present

## 2022-08-27 DIAGNOSIS — B351 Tinea unguium: Secondary | ICD-10-CM

## 2022-08-27 DIAGNOSIS — M216X2 Other acquired deformities of left foot: Secondary | ICD-10-CM | POA: Diagnosis not present

## 2022-08-27 DIAGNOSIS — G629 Polyneuropathy, unspecified: Secondary | ICD-10-CM | POA: Diagnosis not present

## 2022-08-27 LAB — BMP8+EGFR
BUN/Creatinine Ratio: 21 (ref 12–28)
BUN: 13 mg/dL (ref 8–27)
CO2: 21 mmol/L (ref 20–29)
Calcium: 9.3 mg/dL (ref 8.7–10.3)
Creatinine, Ser: 0.61 mg/dL (ref 0.57–1.00)
Glucose: 122 mg/dL — ABNORMAL HIGH (ref 70–99)
Potassium: 4.3 mmol/L (ref 3.5–5.2)
Sodium: 140 mmol/L (ref 134–144)

## 2022-08-27 LAB — CBC WITH DIFFERENTIAL/PLATELET
Basos: 2 %
Hematocrit: 36.4 % (ref 34.0–46.6)
Hemoglobin: 11.5 g/dL (ref 11.1–15.9)
Immature Grans (Abs): 0 10*3/uL (ref 0.0–0.1)
Lymphs: 30 %
MCH: 26.1 pg — ABNORMAL LOW (ref 26.6–33.0)
MCHC: 31.6 g/dL (ref 31.5–35.7)
MCV: 83 fL (ref 79–97)
Monocytes Absolute: 0.4 10*3/uL (ref 0.1–0.9)
Monocytes: 9 %
Neutrophils: 54 %
Platelets: 224 10*3/uL (ref 150–450)
RBC: 4.41 x10E6/uL (ref 3.77–5.28)
RDW: 14.4 % (ref 11.7–15.4)
WBC: 4.7 10*3/uL (ref 3.4–10.8)

## 2022-08-27 LAB — TSH: TSH: 0.644 u[IU]/mL (ref 0.450–4.500)

## 2022-08-27 MED ORDER — CEPHALEXIN 500 MG PO CAPS: 500.0000 mg | ORAL_CAPSULE | Freq: Three times a day (TID) | ORAL | 0 refills | Status: DC

## 2022-08-27 NOTE — Patient Instructions (Addendum)
Increase the Lyrica to 2 tablets at night (total 150mg ) and if no improvement go up to taking 2 tablets in the morning as well (total 150mg ) --  For the left 2nd toe keep a small amount of antibiotic ointment on the toe. Start the antibiotic, Keflex. Monitor for any signs/symptoms of infection. Call the office immediately if any occur or go directly to the emergency room. Call with any questions/concerns.  ---   Plantar Fasciitis (Heel Spur Syndrome) with Rehab The plantar fascia is a fibrous, ligament-like, soft-tissue structure that spans the bottom of the foot. Plantar fasciitis is a condition that causes pain in the foot due to inflammation of the tissue. SYMPTOMS  Pain and tenderness on the underneath side of the foot. Pain that worsens with standing or walking. CAUSES  Plantar fasciitis is caused by irritation and injury to the plantar fascia on the underneath side of the foot. Common mechanisms of injury include: Direct trauma to bottom of the foot. Damage to a small nerve that runs under the foot where the main fascia attaches to the heel bone. Stress placed on the plantar fascia due to bone spurs. RISK INCREASES WITH:  Activities that place stress on the plantar fascia (running, jumping, pivoting, or cutting). Poor strength and flexibility. Improperly fitted shoes. Tight calf muscles. Flat feet. Failure to warm-up properly before activity. Obesity. PREVENTION Warm up and stretch properly before activity. Allow for adequate recovery between workouts. Maintain physical fitness: Strength, flexibility, and endurance. Cardiovascular fitness. Maintain a health body weight. Avoid stress on the plantar fascia. Wear properly fitted shoes, including arch supports for individuals who have flat feet.  PROGNOSIS  If treated properly, then the symptoms of plantar fasciitis usually resolve without surgery. However, occasionally surgery is necessary.  RELATED COMPLICATIONS   Recurrent symptoms that may result in a chronic condition. Problems of the lower back that are caused by compensating for the injury, such as limping. Pain or weakness of the foot during push-off following surgery. Chronic inflammation, scarring, and partial or complete fascia tear, occurring more often from repeated injections.  TREATMENT  Treatment initially involves the use of ice and medication to help reduce pain and inflammation. The use of strengthening and stretching exercises may help reduce pain with activity, especially stretches of the Achilles tendon. These exercises may be performed at home or with a therapist. Your caregiver may recommend that you use heel cups of arch supports to help reduce stress on the plantar fascia. Occasionally, corticosteroid injections are given to reduce inflammation. If symptoms persist for greater than 6 months despite non-surgical (conservative), then surgery may be recommended.   MEDICATION  If pain medication is necessary, then nonsteroidal anti-inflammatory medications, such as aspirin and ibuprofen, or other minor pain relievers, such as acetaminophen, are often recommended. Do not take pain medication within 7 days before surgery. Prescription pain relievers may be given if deemed necessary by your caregiver. Use only as directed and only as much as you need. Corticosteroid injections may be given by your caregiver. These injections should be reserved for the most serious cases, because they may only be given a certain number of times.  HEAT AND COLD Cold treatment (icing) relieves pain and reduces inflammation. Cold treatment should be applied for 10 to 15 minutes every 2 to 3 hours for inflammation and pain and immediately after any activity that aggravates your symptoms. Use ice packs or massage the area with a piece of ice (ice massage). Heat treatment may be used prior to  performing the stretching and strengthening activities prescribed by your  caregiver, physical therapist, or athletic trainer. Use a heat pack or soak the injury in warm water.  SEEK IMMEDIATE MEDICAL CARE IF: Treatment seems to offer no benefit, or the condition worsens. Any medications produce adverse side effects.  EXERCISES- RANGE OF MOTION (ROM) AND STRETCHING EXERCISES - Plantar Fasciitis (Heel Spur Syndrome) These exercises may help you when beginning to rehabilitate your injury. Your symptoms may resolve with or without further involvement from your physician, physical therapist or athletic trainer. While completing these exercises, remember:  Restoring tissue flexibility helps normal motion to return to the joints. This allows healthier, less painful movement and activity. An effective stretch should be held for at least 30 seconds. A stretch should never be painful. You should only feel a gentle lengthening or release in the stretched tissue.  RANGE OF MOTION - Toe Extension, Flexion Sit with your right / left leg crossed over your opposite knee. Grasp your toes and gently pull them back toward the top of your foot. You should feel a stretch on the bottom of your toes and/or foot. Hold this stretch for 10 seconds. Now, gently pull your toes toward the bottom of your foot. You should feel a stretch on the top of your toes and or foot. Hold this stretch for 10 seconds. Repeat  times. Complete this stretch 3 times per day.   RANGE OF MOTION - Ankle Dorsiflexion, Active Assisted Remove shoes and sit on a chair that is preferably not on a carpeted surface. Place right / left foot under knee. Extend your opposite leg for support. Keeping your heel down, slide your right / left foot back toward the chair until you feel a stretch at your ankle or calf. If you do not feel a stretch, slide your bottom forward to the edge of the chair, while still keeping your heel down. Hold this stretch for 10 seconds. Repeat 3 times. Complete this stretch 2 times per day.    STRETCH  Gastroc, Standing Place hands on wall. Extend right / left leg, keeping the front knee somewhat bent. Slightly point your toes inward on your back foot. Keeping your right / left heel on the floor and your knee straight, shift your weight toward the wall, not allowing your back to arch. You should feel a gentle stretch in the right / left calf. Hold this position for 10 seconds. Repeat 3 times. Complete this stretch 2 times per day.  STRETCH  Soleus, Standing Place hands on wall. Extend right / left leg, keeping the other knee somewhat bent. Slightly point your toes inward on your back foot. Keep your right / left heel on the floor, bend your back knee, and slightly shift your weight over the back leg so that you feel a gentle stretch deep in your back calf. Hold this position for 10 seconds. Repeat 3 times. Complete this stretch 2 times per day.  STRETCH  Gastrocsoleus, Standing  Note: This exercise can place a lot of stress on your foot and ankle. Please complete this exercise only if specifically instructed by your caregiver.  Place the ball of your right / left foot on a step, keeping your other foot firmly on the same step. Hold on to the wall or a rail for balance. Slowly lift your other foot, allowing your body weight to press your heel down over the edge of the step. You should feel a stretch in your right / left calf. Hold  this position for 10 seconds. Repeat this exercise with a slight bend in your right / left knee. Repeat 3 times. Complete this stretch 2 times per day.   STRENGTHENING EXERCISES - Plantar Fasciitis (Heel Spur Syndrome)  These exercises may help you when beginning to rehabilitate your injury. They may resolve your symptoms with or without further involvement from your physician, physical therapist or athletic trainer. While completing these exercises, remember:  Muscles can gain both the endurance and the strength needed for everyday activities  through controlled exercises. Complete these exercises as instructed by your physician, physical therapist or athletic trainer. Progress the resistance and repetitions only as guided.  STRENGTH - Towel Curls Sit in a chair positioned on a non-carpeted surface. Place your foot on a towel, keeping your heel on the floor. Pull the towel toward your heel by only curling your toes. Keep your heel on the floor. Repeat 3 times. Complete this exercise 2 times per day.  STRENGTH - Ankle Inversion Secure one end of a rubber exercise band/tubing to a fixed object (table, pole). Loop the other end around your foot just before your toes. Place your fists between your knees. This will focus your strengthening at your ankle. Slowly, pull your big toe up and in, making sure the band/tubing is positioned to resist the entire motion. Hold this position for 10 seconds. Have your muscles resist the band/tubing as it slowly pulls your foot back to the starting position. Repeat 3 times. Complete this exercises 2 times per day.  Document Released: 03/11/2005 Document Revised: 06/03/2011 Document Reviewed: 06/23/2008 Exit[employer] Patient Information 2014 Exit[employer], [place]. ---  Pregabalin Capsules What is this medication? PREGABALIN (pre GAB a lin) treats nerve pain. It may also be used to prevent and control seizures in people with epilepsy. It works by calming overactive nerves in your body. This medicine may be used for other purposes; ask your health care provider or pharmacist if you have questions. COMMON BRAND NAME(S): Lyrica What should I tell my care team before I take this medication? They need to know if you have any of these conditions: Heart failure Kidney disease Lung disease Substance use disorder Suicidal thoughts, plans or attempt by you or a family member An unusual or allergic reaction to pregabalin, other medications, foods, dyes, or preservatives Pregnant or trying to get  pregnant Breast-feeding How should I use this medication? Take this medication by mouth with water. Take it as directed on the prescription label at the same time every day. You can take it with or without food. If it upsets your stomach, take it with food. Keep taking it unless your care team tells you to stop. A special MedGuide will be given to you by the pharmacist with each prescription and refill. Be sure to read this information carefully each time. Talk to your care team about the use of this medication in children. While it may be prescribed for children as young as 1 month for selected conditions, precautions do apply. Overdosage: If you think you have taken too much of this medicine contact a poison control center or emergency room at once. NOTE: This medicine is only for you. Do not share this medicine with others. What if I miss a dose? If you miss a dose, take it as soon as you can. If it is almost time for your next dose, take only that dose. Do not take double or extra doses. What may interact with this medication? This medication may interact with the  following: Alcohol Antihistamines for allergy, cough, and cold Certain medications for anxiety or sleep Certain medications for blood pressure, heart disease Certain medications for depression like amitriptyline, fluoxetine, sertraline Certain medications for diabetes, like pioglitazone, rosiglitazone Certain medications for seizures like phenobarbital, primidone General anesthetics like halothane, isoflurane, methoxyflurane, propofol Medications that relax muscles for surgery Opioid medications for pain Phenothiazines like chlorpromazine, mesoridazine, prochlorperazine, thioridazine This list may not describe all possible interactions. Give your health care provider a list of all the medicines, herbs, non-prescription drugs, or dietary supplements you use. Also tell them if you smoke, drink alcohol, or use illegal drugs. Some  items may interact with your medicine. What should I watch for while using this medication? Visit your care team for regular checks on your progress. Tell your care team if your symptoms do not start to get better or if they get worse. Do not suddenly stop taking this medication. You may develop a severe reaction. Your care team will tell you how much medication to take. If your care team wants you to stop the medication, the dose may be slowly lowered over time to avoid any side effects. This medication may affect your coordination, reaction time, or judgment. Do not drive or operate machinery until you know how this medication affects you. Sit up or stand slowly to reduce the risk of dizzy or fainting spells. Drinking alcohol with this medication can increase the risk of these side effects. If you or your family notice any changes in your behavior, such as new or worsening depression, thoughts of harming yourself, anxiety, other unusual or disturbing thoughts, or memory loss, call your care team right away. Wear a medical ID bracelet or chain if you are taking this medication for seizures. Carry a card that describes your condition. List the medications and doses you take on the card. This medication may make it more difficult to father a child. Talk to your care team if you are concerned about your fertility. What side effects may I notice from receiving this medication? Side effects that you should report to your care team as soon as possible: Allergic reactions or angioedema--skin rash, itching, hives, swelling of the face, eyes, lips, tongue, arms, or legs, trouble swallowing or breathing Blurry vision Thoughts of suicide or self-harm, worsening mood, feelings of depression Trouble breathing Side effects that usually do not require medical attention (report to your care team if they continue or are bothersome): Dizziness Drowsiness Dry mouth Nausea Swelling of the ankles, feet,  hands Vomiting Weight gain This list may not describe all possible side effects. Call your doctor for medical advice about side effects. You may report side effects to FDA at 1-800-FDA-1088. Where should I keep my medication? Keep out of the reach of children and pets. This medication can be abused. Keep it in a safe place to protect it from theft. Do not share it with anyone. It is only for you. Selling or giving away this medication is dangerous and against the law. Store at [employer] [address] (77 degrees F). Get rid of any unused medication after the expiration date. This medication may cause harm and death if it is taken by other adults, children, or pets. It is important to get rid of the medication as soon as you no longer need it, or it is expired. You can do this in two ways: Take the medication to a medication take-back program. Check with your pharmacy or law enforcement to find a location. If you cannot return  the medication, check the label or package insert to see if the medication should be thrown out in the garbage or flushed down the toilet. If you are not sure, ask your care team. If it is safe to put it in the trash, take the medication out of the container. Mix the medication with cat litter, dirt, coffee grounds, or other unwanted substance. Seal the mixture in a bag or container. Put it in the trash. NOTE: This sheet is a summary. It may not cover all possible information. If you have questions about this medicine, talk to your doctor, pharmacist, or health care provider.  2024 Elsevier/Gold Standard (2020-12-12 00:00:00)

## 2022-09-04 ENCOUNTER — Ambulatory Visit (HOSPITAL_COMMUNITY): Payer: Medicaid Other | Admitting: Clinical

## 2022-09-05 ENCOUNTER — Ambulatory Visit (INDEPENDENT_AMBULATORY_CARE_PROVIDER_SITE_OTHER): Payer: Medicaid Other | Admitting: Gastroenterology

## 2022-09-06 ENCOUNTER — Ambulatory Visit: Payer: Medicaid Other | Admitting: Podiatry

## 2022-09-06 NOTE — Progress Notes (Signed)
Subjective: Chief Complaint  Patient presents with   Diabetes    Patient Came in today for Diabetic foot care, Nail trim,  neuropathy, burning tingling numbness bilateral,  left 3rd toe soft tissue mass, patient would like to drain it today, rate of pain 7 out of 10, TX: Lyirca ( doesn't seem to help)  62 year old female presents for above concerns.  She said that she has not seen much improvement with Lyrica.  She has noticed the cyst on the left 2nd toe so that it drained today.  Also nails are thick and elongated causing discomfort.  No open lesions.  No other concerns.  Objective: AAO x3, NAD DP/PT pulses palpable bilaterally, CRT less than 3 seconds Nails are hypertrophic, dystrophic, brittle, discolored, elongated 10. No surrounding redness or drainage. Tenderness nails 1-5 bilaterally. No open lesions or pre-ulcerative lesions are identified today. Fluid-filled soft tissue mass noted along the left third toe dorsally on the DIPJ consistent mucoid versus ganglion cyst.  No surrounding erythema, ascending cellulitis.  No signs of infection. No pain with calf compression, swelling, warmth, erythema  Assessment: 62 year old female with neuropathy on Lyrica; soft tissue mass  Plan: -All treatment options discussed with the patient including all alternatives, risks, complications.  -We reviewed the cyst as well as conservative versus surgical options.  She wants proceed with steroid injection.  I cleaned the skin with alcohol and utilizing ethyl chloride I then introduced a 25-gauge needle into the area and clear, jellylike fluid was expressed.  No purulence.  I then infiltrated quarter cc of dexamethasone dressing, quarter cc Marcaine plain without complications.  Compressive bandage applied.  Discussed removing tomorrow.  Ice, elevation. -Increase Lyrica to 150 mg twice a day -Sharply debrided the nails x 10 without any complications or bleeding -Patient encouraged to call the office with  any questions, concerns, change in symptoms.   Vivi Barrack DPM

## 2022-09-10 ENCOUNTER — Telehealth: Payer: Self-pay | Admitting: Podiatry

## 2022-09-10 ENCOUNTER — Telehealth (INDEPENDENT_AMBULATORY_CARE_PROVIDER_SITE_OTHER): Payer: Self-pay | Admitting: Gastroenterology

## 2022-09-10 ENCOUNTER — Ambulatory Visit (INDEPENDENT_AMBULATORY_CARE_PROVIDER_SITE_OTHER): Payer: Medicaid Other | Admitting: Gastroenterology

## 2022-09-10 ENCOUNTER — Encounter (INDEPENDENT_AMBULATORY_CARE_PROVIDER_SITE_OTHER): Payer: Self-pay | Admitting: Gastroenterology

## 2022-09-10 VITALS — BP 113/75 | HR 77 | Temp 98.7°F | Ht 58.5 in | Wt 143.3 lb

## 2022-09-10 DIAGNOSIS — K219 Gastro-esophageal reflux disease without esophagitis: Secondary | ICD-10-CM | POA: Diagnosis not present

## 2022-09-10 DIAGNOSIS — R1013 Epigastric pain: Secondary | ICD-10-CM

## 2022-09-10 DIAGNOSIS — K581 Irritable bowel syndrome with constipation: Secondary | ICD-10-CM

## 2022-09-10 DIAGNOSIS — G8929 Other chronic pain: Secondary | ICD-10-CM | POA: Diagnosis not present

## 2022-09-10 MED ORDER — DICYCLOMINE HCL 10 MG PO CAPS: 10.0000 mg | ORAL_CAPSULE | Freq: Two times a day (BID) | ORAL | 2 refills | Status: DC

## 2022-09-10 NOTE — Telephone Encounter (Signed)
Requesting a refill on script pregabalin (LYRICA) 75 MG capsule patient stated they have used all the medication and would like a refill.

## 2022-09-10 NOTE — Patient Instructions (Signed)
Continue with linzess and omeprazole 40mg  daily Continue to use bentyl 10mg  up to twice a day as needed for abdominal discomfort  Aim for atleast 64 oz water per day Increase fruits, veggies and whole grains, kiwi and prunes are especially good for constipation We will obtain a CT of your abdomen to rule out other causes of your ongoing pain  Follow up 1 year

## 2022-09-10 NOTE — Telephone Encounter (Signed)
CT scheduled for tomorrow at 9:00am. Pt is to arrive at 7 am to [facility]. Pt contacted and made aware.   PA approved Auth # [id number] Expiration Date: 10/25/22

## 2022-09-10 NOTE — Progress Notes (Signed)
Referring Provider: Gabriel Earing, FNP Primary Care Physician:  Gabriel Earing, FNP Primary GI Physician:   Chief Complaint  Patient presents with   Abdominal Pain    Follow up on epi gastric pain. Reports she is still having this pain. About the same.    Constipation    Follow up on constipation. Reports better with linzess 145 mcg. Has 1 -3 stools most days. Sometimes may go several days in between BM.    HPI:   Charlotte Perez is a 62 y.o. female with past medical history of asthma, ADD, bipolar disorder, COPD, diabetes, depression, IBS-C, chronic opiate use   Patient presenting today for follow up of constipation, GERD, and epigastric pain   Last seen June 2023, at that time taking linzess PRN, usually every other day, having A BM almost daily. Having epigastric pain, takes omeprazole 40mg  daily which helps to decrease her abdominal burning, taking bentyl q12h which also helps   Recommended to continue with bentyl, linzess, omeprazole, consider CT A/P if pain persisted  Present:  Taking linzess 145 mcg daily, she was having good BMs up until 3 days ago but notes she feels when she gets hot she doesn't eat breakfast, she thinks this causes her linzess to not work as well. Her last BM was Thursday. Typically has 2-3 BMs per day that are very soft. No rectal bleeding or melena.  She has continued epigastric pain, usually a 2/10 in severity. She thinks stress might make it worse. She is taking her omeprazole 40mg  daily and felt it was working well, reports she just got back on it recently as she ran out and was unsure what happened, she restarted this last month and notes GERD is well controlled. She does not feel that eating changes her epigastric pain. She denies nausea or vomiting. She is taking bentyl once daily which she feels may help with her abdominal pain some. Reports appetite is good, she is hungry. No early satiety. Weight is stable.   Last Colonoscopy:  07/21/2015, performed at [facility] by Dr. Teena Dunk.  Found to have small external and internal hemorrhoids, mild diverticulosis in the sigmoid colon.  Recommended to repeat in 10 years. Last Endoscopy:03/07/21 which showed normal esophagus, stomach and duodenum.  Biopsies from small bowel were within normal limits.  Gastric biopsies showed presence of gastrointestinal metaplasia and chronic atrophy gastritis but negative for H. pylori.  Recommended to have a repeat EGD in 3 years for gastric mapping.   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   Arthritis    Asthma    Bipolar disorder (HCC)    Chronic back pain    Chronic neck pain    Constipation 09/05/2016   COPD (chronic obstructive pulmonary disease) (HCC)    COPD with asthma 03/06/2022   DDD (degenerative disc disease), cervical    DDD (degenerative disc disease), lumbar    Depression    Diabetes mellitus (HCC) 06/27/2010   Diabetes mellitus, type II (HCC)    Gallbladder sludge    GERD (gastroesophageal reflux disease)    Hyperlipidemia    Hypertension    Iron deficiency anemia 09/05/2016   Lumbar radiculopathy    Mole (skin)    Neuropathy of foot    Renal cyst    Shingles    Skin lesions, generalized    1.2 CM FLAT FAWN COLOR  AT THE T10 AREA JUST RIGHT OF SPINE     Past Surgical History:  Procedure Laterality Date   ABDOMINAL HYSTERECTOMY     total hysterectomy   BACK SURGERY     BIOPSY  03/07/2021   Procedure: BIOPSY;  Surgeon: Marguerita Merles, Reuel Boom, MD;  [facility];  Service: Gastroenterology;;   CESAREAN SECTION     x 2   ESOPHAGOGASTRODUODENOSCOPY (EGD) WITH PROPOFOL N/A 03/07/2021   Procedure: ESOPHAGOGASTRODUODENOSCOPY (EGD) WITH PROPOFOL;  Surgeon: Dolores Frame, MD;  [facility];  Service: Gastroenterology;  Laterality: N/A;  9:15    NECK SURGERY      Current Outpatient Medications  Medication Sig Dispense Refill   ACCU-CHEK FASTCLIX LANCETS MISC EVERY DAY 102 each 2   albuterol (PROVENTIL) (2.5 MG/3ML) 0.083% nebulizer solution Take 3 mLs (2.5 mg total) by nebulization every 6 (six) hours as needed for wheezing or shortness of breath. 75 mL 3   albuterol (VENTOLIN HFA) 108 (90 Base) MCG/ACT inhaler Inhale 2 puffs into the lungs every 6 (six) hours as needed for wheezing or shortness of breath. 8 g 0   ALPRAZolam (XANAX) 1 MG tablet Take 1 tablet (1 mg total) by mouth at bedtime as needed for anxiety. 30 tablet 2   ALPRAZolam (XANAX) 1 MG tablet Take 1 tablet (1 mg total) by mouth at bedtime as needed for anxiety. 30 tablet 2   ARIPiprazole (ABILIFY) 2 MG tablet Take 1 tablet (2 mg total) by mouth daily. 30 tablet 2   atorvastatin (LIPITOR) 40 MG tablet TAKE ONE (1) TABLET BY MOUTH EVERY DAY 90 tablet 3   Budeson-Glycopyrrol-Formoterol (BREZTRI AEROSPHERE) 160-9-4.8 MCG/ACT AERO Inhale 2 puffs into the lungs 2 (two) times daily. 10.7 g 11   cycloSPORINE (RESTASIS) 0.05 % ophthalmic emulsion Place 1 drop into both eyes at bedtime.     dicyclomine (BENTYL) 10 MG capsule Take 10 mg by mouth 2 times daily at 12 noon and 4 pm.     empagliflozin (JARDIANCE) 25 MG TABS tablet Take 1 tablet (25 mg total) by mouth daily. 90 tablet 3   glucose blood (ACCU-CHEK GUIDE) test strip Use daily Dx E11.9 100 each 3   HYDROcodone-acetaminophen (NORCO) 10-325 MG tablet Take 1 tablet by mouth 3 (three) times daily as needed for pain.     latanoprost (XALATAN) 0.005 % ophthalmic solution Place 1 drop into both eyes at bedtime.     linaclotide (LINZESS) 145 MCG CAPS capsule Take 1 capsule (145 mcg total) by mouth daily before breakfast. 90 capsule 3   lisinopril (ZESTRIL) 5 MG tablet TAKE ONE (1) TABLET EACH DAY (Patient taking differently: Take 2.5 mg by mouth daily. TAKE ONE (1) TABLET EACH DAY) 90 tablet 0   meclizine (ANTIVERT) 25 MG tablet  Take 50 mg by mouth 2 (two) times daily.     metFORMIN (GLUCOPHAGE) 1000 MG tablet Take 1 tablet (1,000 mg total) by mouth 2 (two) times daily with a meal. 180 tablet 1   omeprazole (PRILOSEC) 40 MG capsule Take 1 capsule (40 mg total) by mouth daily. TAKE ONE (1) CAPSULE EACH DAY Strength: 40 mg 90 capsule 3   pregabalin (LYRICA) 75 MG capsule TAKE ONE (1) CAPSULE BY MOUTH 2 TIMES DAILY 180 capsule 2   sitaGLIPtin (JANUVIA) 100 MG tablet Take 1 tablet (100 mg total) by mouth daily. 90 tablet 3   triamcinolone ointment (KENALOG) 0.5 % Pea-sized amount externally daily as needed 30 g 1   venlafaxine XR (EFFEXOR-XR)  150 MG 24 hr capsule Take 2 capsules (300 mg total) by mouth at bedtime. 60 capsule 2   No current facility-administered medications for this visit.    Allergies as of 09/10/2022 - Review Complete 09/10/2022  Allergen Reaction Noted   Cymbalta [duloxetine hcl]  07/27/2014   Gabapentin  06/27/2010   Meloxicam Other (See Comments) 12/26/2014   Tramadol Other (See Comments) 12/26/2014    Family History  Problem Relation Age of Onset   ADD / ADHD Other    COPD Father    Alcohol abuse Father    Depression Daughter    Alcohol abuse Mother    COPD Mother        on oxygen   Colon cancer Paternal Grandfather    Heart disease Sister    Heart disease Brother    Arthritis Sister        knee replacement   Diabetes Maternal Grandmother    Diabetes Sister     Social History   Socioeconomic History   Marital status: Legally Separated    Spouse name: Ronnie   Number of children: 3   Years of education: 9   Highest education level: 9th grade  Occupational History   Occupation: disablility  Tobacco Use   Smoking status: Never    Passive exposure: Yes   Smokeless tobacco: Never  Vaping Use   Vaping Use: Never used  Substance and Sexual Activity   Alcohol use: No   Drug use: No   Sexual activity: Not Currently    Birth control/protection: Surgical    Comment: hyst   Other Topics Concern   Not on file  Social History Narrative   Not on file   Social Determinants of Health   Financial Resource Strain: Medium Risk (07/10/2022)   Overall Financial Resource Strain (CARDIA)    Difficulty of Paying Living Expenses: Somewhat hard  Food Insecurity: Food Insecurity Present (07/10/2022)   Hunger Vital Sign    Worried About Running Out of Food in the Last Year: Often true    Ran Out of Food in the Last Year: Sometimes true  Transportation Needs: Unmet Transportation Needs (07/10/2022)   PRAPARE - Transportation    Lack of Transportation (Medical): No    Lack of Transportation (Non-Medical): Yes  Physical Activity: Insufficiently Active (07/10/2022)   Exercise Vital Sign    Days of Exercise per Week: 2 days    Minutes of Exercise per Session: 20 min  Stress: Stress Concern Present (07/10/2022)   [employer] of Occupational Health - Occupational Stress Questionnaire    Feeling of Stress : To some extent  Social Connections: Socially Isolated (07/10/2022)   Social Connection and Isolation Panel [NHANES]    Frequency of Communication with Friends and Family: Once a week    Frequency of Social Gatherings with Friends and Family: Never    Attends Religious Services: Never    Diplomatic Services operational officer: No    Attends Engineer, structural: Never    Marital Status: Married   Review of systems General: negative for malaise, night sweats, fever, chills, weight loss Neck: Negative for lumps, goiter, pain and significant neck swelling Resp: Negative for cough, wheezing, dyspnea at rest CV: Negative for chest pain, leg swelling, palpitations, orthopnea GI: denies melena, hematochezia, nausea, vomiting, diarrhea, dysphagia, odyonophagia, early satiety or unintentional weight loss. +constipation +epigastric pain  MSK: Negative for joint pain or swelling, back pain, and muscle pain. Derm: Negative for itching or rash Psych: Denies  depression, anxiety, memory loss, confusion. No homicidal or suicidal ideation.  Heme: Negative for prolonged bleeding, bruising easily, and swollen nodes. Endocrine: Negative for cold or heat intolerance, polyuria, polydipsia and goiter. Neuro: negative for tremor, gait imbalance, syncope and seizures. The remainder of the review of systems is noncontributory.  Physical Exam: BP 113/75 (BP Location: Left Arm, Patient Position: Sitting, Cuff Size: Normal)   Pulse 77   Temp 98.7 F (37.1 C) (Oral)   Ht 4' 10.5" (1.486 m)   Wt 143 lb 4.8 oz (65 kg)   BMI 29.44 kg/m  General:   Alert and oriented. No distress noted. Pleasant and cooperative.  Head:  Normocephalic and atraumatic. Eyes:  Conjuctiva clear without scleral icterus. Mouth:  Oral mucosa pink and moist. Good dentition. No lesions. Heart: Normal rate and rhythm, s1 and s2 heart sounds present.  Lungs: Clear lung sounds in all lobes. Respirations equal and unlabored. Abdomen:  +BS, soft, non-tender and non-distended. No rebound or guarding. No HSM or masses noted. Derm: No palmar erythema or jaundice Msk:  Symmetrical without gross deformities. Normal posture. Extremities:  Without edema. Neurologic:  Alert and  oriented x4 Psych:  Alert and cooperative. Normal mood and affect.  Invalid input(s): "6 MONTHS"   ASSESSMENT: TAIFA ZAHRADNIK is a 62 y.o. female presenting today for follow up of GERD, Epigastric pain and constipation   GERD: well controlled on omeprazole 40mg  daily, no dysphagia, odynophagia, no early satiety. Rare breakthrough. Will continue with PPI once daily.   Epigastric Pain: ongoing, not influenced by eating. No real alleviating or precipitating factors. Recent EGD without findings to explain pain. Denies radiation, no issues with pancreas/pancreatitis that she is aware of though notes her A1C was recently up despite her BG levels being good and no change in her diet. No weight loss or early satiety. She does  not drink ETOH. Unclear etiology of her pain, recommend proceeding with CT Abdomen with contrast for further evaluation as I cannot rule out pancreatic etiology.   Constipation:  likely heavily influenced by chronic opiate use. She is doing well on linzess daily, having 2-3 BMs per day on most days though notes as the weather has gotten hotter she does not eat breakfast and feels linzess does not work as well when she doesn't. Denies rectal bleeding or melena. Encouraged to continue with linzess daily and to take with food. Increase water intake, aim for atleast 64 oz per day, Increase fruits, veggies and whole grains, kiwi and prunes are especially good for constipation   PLAN:  Continue linzess daily, take with food  2. Increase water intake, aim for atleast 64 oz per day Increase fruits, veggies and whole grains, kiwi and prunes are especially good for constipation 3. Continue with omeprazole 40mg  daily 4. Continue bentyl 10mg  up to BID PRN 5. Will obtain CT Abdomen with contrast 6. Repeat EGD 02/2024 with gastric mapping  All questions were answered, patient verbalized understanding and is in agreement with plan as outlined above.   Follow Up: 1 year   Sriya Kroeze L. Jeanmarie Hubert, MSN, APRN, AGNP-C Adult-Gerontology Nurse Practitioner [facility]  I have reviewed the note and agree with the APP's assessment as described in this progress note  Katrinka Blazing, MD Gastroenterology and Hepatology [facility]

## 2022-09-11 ENCOUNTER — Other Ambulatory Visit: Payer: Self-pay | Admitting: Podiatry

## 2022-09-11 ENCOUNTER — Other Ambulatory Visit (HOSPITAL_COMMUNITY): Payer: Self-pay | Admitting: Family Medicine

## 2022-09-11 ENCOUNTER — Ambulatory Visit (HOSPITAL_COMMUNITY)
Admission: RE | Admit: 2022-09-11 | Discharge: 2022-09-11 | Disposition: A | Discharge status: Home / Self Care | Payer: Medicaid Other | Source: Ambulatory Visit | Attending: Gastroenterology | Admitting: Gastroenterology

## 2022-09-11 DIAGNOSIS — G8929 Other chronic pain: Secondary | ICD-10-CM | POA: Insufficient documentation

## 2022-09-11 DIAGNOSIS — N281 Cyst of kidney, acquired: Secondary | ICD-10-CM | POA: Diagnosis not present

## 2022-09-11 DIAGNOSIS — R1013 Epigastric pain: Secondary | ICD-10-CM | POA: Insufficient documentation

## 2022-09-11 DIAGNOSIS — N644 Mastodynia: Secondary | ICD-10-CM

## 2022-09-11 MED ORDER — IOHEXOL 300 MG/ML  SOLN
80.0000 mL | Freq: Once | INTRAMUSCULAR | Status: AC | PRN
  Administered 2022-09-11: 80 mL via INTRAVENOUS

## 2022-09-11 MED ORDER — PREGABALIN 150 MG PO CAPS: 150.0000 mg | ORAL_CAPSULE | Freq: Two times a day (BID) | ORAL | 2 refills | Status: DC

## 2022-09-13 DIAGNOSIS — E119 Type 2 diabetes mellitus without complications: Secondary | ICD-10-CM | POA: Diagnosis not present

## 2022-09-13 DIAGNOSIS — R030 Elevated blood-pressure reading, without diagnosis of hypertension: Secondary | ICD-10-CM | POA: Diagnosis not present

## 2022-09-13 DIAGNOSIS — Z79899 Other long term (current) drug therapy: Secondary | ICD-10-CM | POA: Diagnosis not present

## 2022-09-13 DIAGNOSIS — Z6826 Body mass index (BMI) 26.0-26.9, adult: Secondary | ICD-10-CM | POA: Diagnosis not present

## 2022-09-13 DIAGNOSIS — E213 Hyperparathyroidism, unspecified: Secondary | ICD-10-CM | POA: Diagnosis not present

## 2022-09-13 DIAGNOSIS — M545 Low back pain, unspecified: Secondary | ICD-10-CM | POA: Diagnosis not present

## 2022-09-16 ENCOUNTER — Telehealth: Payer: Self-pay

## 2022-09-16 NOTE — Telephone Encounter (Signed)
Patient called c/o chest pain and trouble breathing - advised the patient to go to the ED ASAP.

## 2022-09-17 DIAGNOSIS — Z79899 Other long term (current) drug therapy: Secondary | ICD-10-CM | POA: Diagnosis not present

## 2022-09-19 ENCOUNTER — Ambulatory Visit (HOSPITAL_COMMUNITY)
Admission: RE | Admit: 2022-09-19 | Discharge: 2022-09-19 | Disposition: A | Discharge status: Home / Self Care | Payer: Medicaid Other | Source: Ambulatory Visit | Attending: Family Medicine | Admitting: Family Medicine

## 2022-09-19 ENCOUNTER — Encounter (HOSPITAL_COMMUNITY): Payer: Self-pay

## 2022-09-19 DIAGNOSIS — N644 Mastodynia: Secondary | ICD-10-CM

## 2022-09-19 DIAGNOSIS — R92323 Mammographic fibroglandular density, bilateral breasts: Secondary | ICD-10-CM | POA: Diagnosis not present

## 2022-09-27 ENCOUNTER — Other Ambulatory Visit: Payer: Self-pay | Admitting: Family

## 2022-09-27 DIAGNOSIS — E1165 Type 2 diabetes mellitus with hyperglycemia: Secondary | ICD-10-CM

## 2022-10-02 ENCOUNTER — Other Ambulatory Visit (HOSPITAL_COMMUNITY): Payer: Self-pay | Admitting: Psychiatry

## 2022-10-16 ENCOUNTER — Ambulatory Visit (INDEPENDENT_AMBULATORY_CARE_PROVIDER_SITE_OTHER): Payer: Medicaid Other | Admitting: Obstetrics & Gynecology

## 2022-10-16 ENCOUNTER — Encounter: Payer: Self-pay | Admitting: Obstetrics & Gynecology

## 2022-10-16 ENCOUNTER — Other Ambulatory Visit (HOSPITAL_COMMUNITY)
Admission: RE | Admit: 2022-10-16 | Discharge: 2022-10-16 | Disposition: A | Discharge status: Home / Self Care | Payer: Medicaid Other | Source: Ambulatory Visit | Attending: Obstetrics & Gynecology | Admitting: Obstetrics & Gynecology

## 2022-10-16 VITALS — BP 99/60 | HR 93 | Ht 58.5 in | Wt 145.0 lb

## 2022-10-16 DIAGNOSIS — L309 Dermatitis, unspecified: Secondary | ICD-10-CM | POA: Insufficient documentation

## 2022-10-16 DIAGNOSIS — N898 Other specified noninflammatory disorders of vagina: Secondary | ICD-10-CM | POA: Diagnosis not present

## 2022-10-16 MED ORDER — CLOBETASOL PROPIONATE 0.05 % EX OINT
TOPICAL_OINTMENT | CUTANEOUS | 1 refills | Status: DC
Start: 2022-10-16 — End: 2023-10-08

## 2022-10-16 NOTE — Progress Notes (Signed)
GYN VISIT Patient name: Charlotte Perez MRN [id number]  Date of birth: January 01, 1961 Chief Complaint:   Follow-up  History of Present Illness:   Charlotte Perez is a 62 y.o. G3P3003 PM, PH female being seen today for follow up regarding:  -Vaginitis- C. Glabrata and Vulvar dermatitis:  Today she notes that after completion of boric acid she did not have any improvement.  Still having intermittent itching/irritation- mostly externally.  Using Triamcinolone- 3-4x daily.  Ointment will "calm it down" for a little bit and so then she will use the ointment again.  She also reports some relief with a cool compress as she feels "heat" in that area.  Denies any other acute complaints.  No LMP recorded. Patient has had a hysterectomy.    Review of Systems:   Pertinent items are noted in HPI Denies fever/chills, dizziness, headaches, visual disturbances, fatigue, shortness of breath, chest pain, abdominal pain.  Not sexually active Pertinent History Reviewed:   Past Surgical History:  Procedure Laterality Date   ABDOMINAL HYSTERECTOMY     total hysterectomy   BACK SURGERY     BIOPSY  03/07/2021   Procedure: BIOPSY;  Surgeon: Marguerita Merles, Reuel Boom, MD;  [facility]: [facility];  Service: Gastroenterology;;   CESAREAN SECTION     x 2   ESOPHAGOGASTRODUODENOSCOPY (EGD) WITH PROPOFOL N/A 03/07/2021   Procedure: ESOPHAGOGASTRODUODENOSCOPY (EGD) WITH PROPOFOL;  Surgeon: Dolores Frame, MD;  [facility];  Service: Gastroenterology;  Laterality: N/A;  9:15   NECK SURGERY      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   Arthritis    Asthma    Bipolar disorder (HCC)    Chronic back pain    Chronic neck pain    Constipation 09/05/2016   COPD (chronic obstructive pulmonary disease) (HCC)    COPD with asthma 03/06/2022   DDD (degenerative disc disease), cervical    DDD (degenerative disc disease),  lumbar    Depression    Diabetes mellitus (HCC) 06/27/2010   Diabetes mellitus, type II (HCC)    Gallbladder sludge    GERD (gastroesophageal reflux disease)    Hyperlipidemia    Hypertension    Iron deficiency anemia 09/05/2016   Lumbar radiculopathy    Mole (skin)    Neuropathy of foot    Renal cyst    Shingles    Skin lesions, generalized    1.2 CM FLAT FAWN COLOR AT THE T10 AREA JUST RIGHT OF SPINE    Reviewed problem list, medications and allergies. Physical Assessment:   Vitals:   10/16/22 0938  BP: 99/60  Pulse: 93  Weight: 145 lb (65.8 kg)  Height: 4' 10.5" (1.486 m)  Body mass index is 29.79 kg/m.       Physical Examination:   General appearance: alert, well appearing, and in no distress  Psych: mood appropriate, normal affect  Skin: warm & dry   Cardiovascular: normal heart rate noted  Respiratory: normal respiratory effort, no distress  Abdomen: soft, non-tender   Pelvic: VULVA: normal appearing vulva with no masses- some loss of labial architecture.  No tenderness or lesions, VAGINA: normal appearing vagina loss of vaginal rugae, uterus and cervix surgically absent.  Slight tan-white discharge noted.  No tenderness on exam  Extremities: no edema   Chaperone: Faith Rogue    Assessment & Plan:  1) Vulvar dermatitis, Vaginal itching -recheck vaginitis panel to confirm TOC -plan to transition from low  dose to high dose steroid -encouraged pt to use conservatively- at most twice daily and then decrease  []  if no improvement may consider estrogen/vaginal atrophy treatment  Meds ordered this encounter  Medications   clobetasol ointment (TEMOVATE) 0.05 %    Sig: Pea-sized amount to affected area as needed- at most nightly    Dispense:  30 g    Refill:  1     Return in about 6 weeks (around 11/27/2022) for Medication follow up, with Dr. Charlotta Newton.   Myna Hidalgo, DO Attending Obstetrician & Gynecologist, [facility] for [employer],  [facility]

## 2022-10-16 NOTE — Addendum Note (Signed)
Addended by: Annamarie Dawley on: 10/16/2022 03:49 PM   Modules accepted: Orders

## 2022-10-17 DIAGNOSIS — E213 Hyperparathyroidism, unspecified: Secondary | ICD-10-CM | POA: Diagnosis not present

## 2022-10-17 DIAGNOSIS — Z79899 Other long term (current) drug therapy: Secondary | ICD-10-CM | POA: Diagnosis not present

## 2022-10-17 DIAGNOSIS — Z6826 Body mass index (BMI) 26.0-26.9, adult: Secondary | ICD-10-CM | POA: Diagnosis not present

## 2022-10-17 DIAGNOSIS — R030 Elevated blood-pressure reading, without diagnosis of hypertension: Secondary | ICD-10-CM | POA: Diagnosis not present

## 2022-10-17 DIAGNOSIS — M545 Low back pain, unspecified: Secondary | ICD-10-CM | POA: Diagnosis not present

## 2022-10-17 DIAGNOSIS — E119 Type 2 diabetes mellitus without complications: Secondary | ICD-10-CM | POA: Diagnosis not present

## 2022-10-20 ENCOUNTER — Other Ambulatory Visit: Payer: Self-pay | Admitting: Obstetrics & Gynecology

## 2022-10-20 ENCOUNTER — Encounter: Payer: Self-pay | Admitting: Obstetrics & Gynecology

## 2022-10-20 DIAGNOSIS — N760 Acute vaginitis: Secondary | ICD-10-CM

## 2022-10-20 MED ORDER — METRONIDAZOLE 500 MG PO TABS: 500.0000 mg | ORAL_TABLET | Freq: Two times a day (BID) | ORAL | 0 refills | Status: AC

## 2022-10-20 MED ORDER — FLUCONAZOLE 150 MG PO TABS: ORAL_TABLET | ORAL | 0 refills | Status: DC

## 2022-10-20 MED ORDER — CVS BORIC ACID 600 MG VA SUPP
1.0000 | Freq: Every evening | VAGINAL | 3 refills | Status: DC
Start: 2022-10-20 — End: 2023-12-11

## 2022-10-20 NOTE — Progress Notes (Signed)
Prescriptions and mychart message sent in Pt also called with results that prescriptions were being sent in  Myna Hidalgo, DO Attending Obstetrician & Gynecologist, [facility] for [employer], [facility]

## 2022-10-22 DIAGNOSIS — Z79899 Other long term (current) drug therapy: Secondary | ICD-10-CM | POA: Diagnosis not present

## 2022-10-24 ENCOUNTER — Ambulatory Visit (INDEPENDENT_AMBULATORY_CARE_PROVIDER_SITE_OTHER): Payer: Medicaid Other | Admitting: Gastroenterology

## 2022-10-29 ENCOUNTER — Encounter (HOSPITAL_COMMUNITY): Payer: Self-pay | Admitting: Psychiatry

## 2022-10-29 ENCOUNTER — Ambulatory Visit (INDEPENDENT_AMBULATORY_CARE_PROVIDER_SITE_OTHER): Payer: Medicaid Other | Admitting: Psychiatry

## 2022-10-29 VITALS — BP 118/82 | HR 88 | Ht 59.0 in | Wt 144.0 lb

## 2022-10-29 DIAGNOSIS — F321 Major depressive disorder, single episode, moderate: Secondary | ICD-10-CM | POA: Diagnosis not present

## 2022-10-29 DIAGNOSIS — F411 Generalized anxiety disorder: Secondary | ICD-10-CM

## 2022-10-29 MED ORDER — ALPRAZOLAM 1 MG PO TABS: 1.0000 mg | ORAL_TABLET | Freq: Every evening | ORAL | 2 refills | Status: DC | PRN

## 2022-10-29 MED ORDER — VENLAFAXINE HCL ER 150 MG PO CP24: 300.0000 mg | ORAL_CAPSULE | Freq: Every day | ORAL | 2 refills | Status: DC

## 2022-10-29 NOTE — Progress Notes (Signed)
BH MD/PA/NP OP Progress Note  10/29/2022 10:27 AM Charlotte Perez  MRN:  [id number]  Chief Complaint:  Chief Complaint  Patient presents with   Follow-up   Anxiety   Depression   HPI: This patient is a 62 year old married white female who lives with her husband in [place].  She is on disability.  The patient returns for follow-up after 3 months regarding her depression and anxiety.  She states things are still difficult with her husband.  He has early dementia and can be very demanding and controlling.  He treats her like a start of it.  He constantly follows her around and will let her have any privacy or friends.  He does not even want her to come here for therapy.  She is getting very frustrated and depressed.  She is sleeping pretty well with the Xanax.  She does feel like the medications are helping as best they can but the whole situation has been difficult.  She still does not know if she can stay in the marriage.  She had to stop the Abilify because it was causing ringing in her ears.  She denies any thoughts of self-harm or suicide. Visit Diagnosis:    ICD-10-CM   1. Moderate single current episode of major depressive disorder (HCC)  F32.1     2. 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   DDD (degenerative disc disease), cervical    DDD (degenerative disc disease), lumbar    Depression    Diabetes mellitus (HCC) 06/27/2010   Diabetes mellitus, type II (HCC)    Gallbladder sludge    GERD (gastroesophageal reflux disease)    Hyperlipidemia    Hypertension    Iron deficiency anemia  09/05/2016   Lumbar radiculopathy    Mole (skin)    Neuropathy of foot    Renal cyst    Shingles    Skin lesions, generalized    1.2 CM FLAT FAWN COLOR AT THE T10 AREA JUST RIGHT OF SPINE     Past Surgical History:  Procedure Laterality Date   ABDOMINAL HYSTERECTOMY     total hysterectomy   BACK SURGERY     BIOPSY  03/07/2021   Procedure: BIOPSY;  Surgeon: Dolores Frame, MD;  [facility];  Service: Gastroenterology;;   CESAREAN SECTION     x 2   ESOPHAGOGASTRODUODENOSCOPY (EGD) WITH PROPOFOL N/A 03/07/2021   Procedure: ESOPHAGOGASTRODUODENOSCOPY (EGD) WITH PROPOFOL;  Surgeon: Dolores Frame, MD;  [facility];  Service: Gastroenterology;  Laterality: N/A;  9:15   NECK SURGERY      Family Psychiatric History: See below  Family History:  Family History  Problem Relation Age of Onset   ADD / ADHD Other    COPD Father    Alcohol abuse Father    Depression Daughter    Alcohol abuse Mother    COPD Mother        on oxygen   Colon cancer Paternal Grandfather    Heart disease Sister    Heart disease Brother  Arthritis Sister        knee replacement   Diabetes Maternal Grandmother    Diabetes Sister     Social History:  Social History   Socioeconomic History   Marital status: Legally Separated    Spouse name: Ronnie   Number of children: 3   Years of education: 9   Highest education level: 9th grade  Occupational History   Occupation: disablility  Tobacco Use   Smoking status: Never    Passive exposure: Yes   Smokeless tobacco: Never  Vaping Use   Vaping status: Never Used  Substance and Sexual Activity   Alcohol use: No   Drug use: No   Sexual activity: Not Currently    Birth control/protection: Surgical    Comment: hyst  Other Topics Concern   Not on file  Social History Narrative   Not on file   Social Determinants of Health   Financial Resource Strain: Medium Risk (07/10/2022)   Overall Financial  Resource Strain (CARDIA)    Difficulty of Paying Living Expenses: Somewhat hard  Food Insecurity: Food Insecurity Present (07/10/2022)   Hunger Vital Sign    Worried About Running Out of Food in the Last Year: Often true    Ran Out of Food in the Last Year: Sometimes true  Transportation Needs: Unmet Transportation Needs (07/10/2022)   PRAPARE - Transportation    Lack of Transportation (Medical): No    Lack of Transportation (Non-Medical): Yes  Physical Activity: Insufficiently Active (07/10/2022)   Exercise Vital Sign    Days of Exercise per Week: 2 days    Minutes of Exercise per Session: 20 min  Stress: Stress Concern Present (07/10/2022)   [employer] of Occupational Health - Occupational Stress Questionnaire    Feeling of Stress : To some extent  Social Connections: Socially Isolated (07/10/2022)   Social Connection and Isolation Panel [NHANES]    Frequency of Communication with Friends and Family: Once a week    Frequency of Social Gatherings with Friends and Family: Never    Attends Religious Services: Never    Database administrator or Organizations: No    Attends Banker Meetings: Never    Marital Status: Married    Allergies:  Allergies  Allergen Reactions   Cymbalta [Duloxetine Hcl]     Per pt it started making her mouth tingle and could not taste anything   Gabapentin     Itching, fidgety   Meloxicam Other (See Comments)    Tongue tingling, and lost sense of taste.   Tramadol Other (See Comments)    Tongue tingling, and lost sense of taste.    Metabolic Disorder Labs: Lab Results  Component Value Date   HGBA1C 7.1 (H) 08/26/2022   No results found for: "PROLACTIN" Lab Results  Component Value Date   CHOL 162 02/07/2022   TRIG 81 02/07/2022   HDL 70 02/07/2022   CHOLHDL 2.3 02/07/2022   LDLCALC 77 02/07/2022   LDLCALC 69 05/11/2020   Lab Results  Component Value Date   TSH 0.644 08/26/2022   TSH 1.44 11/16/2020    Therapeutic Level  Labs: No results found for: "LITHIUM" No results found for: "VALPROATE" No results found for: "CBMZ"  Current Medications: Current Outpatient Medications  Medication Sig Dispense Refill   ACCU-CHEK FASTCLIX LANCETS MISC EVERY DAY 102 each 2   albuterol (PROVENTIL) (2.5 MG/3ML) 0.083% nebulizer solution Take 3 mLs (2.5 mg total) by nebulization every 6 (six) hours as needed for wheezing or  shortness of breath. 75 mL 3   albuterol (VENTOLIN HFA) 108 (90 Base) MCG/ACT inhaler Inhale 2 puffs into the lungs every 6 (six) hours as needed for wheezing or shortness of breath. 8 g 0   atorvastatin (LIPITOR) 40 MG tablet TAKE ONE (1) TABLET BY MOUTH EVERY DAY 90 tablet 3   Boric Acid Vaginal ([employer] BORIC ACID) 600 MG SUPP Place 1 suppository vaginally at bedtime. 7 suppository 3   Budeson-Glycopyrrol-Formoterol (BREZTRI AEROSPHERE) 160-9-4.8 MCG/ACT AERO Inhale 2 puffs into the lungs 2 (two) times daily. 10.7 g 11   clobetasol ointment (TEMOVATE) 0.05 % Pea-sized amount to affected area as needed- at most nightly 30 g 1   cycloSPORINE (RESTASIS) 0.05 % ophthalmic emulsion Place 1 drop into both eyes at bedtime.     dicyclomine (BENTYL) 10 MG capsule Take 1 capsule (10 mg total) by mouth 2 times daily at 12 noon and 4 pm. 60 capsule 2   empagliflozin (JARDIANCE) 25 MG TABS tablet Take 1 tablet (25 mg total) by mouth daily. 90 tablet 3   fluconazole (DIFLUCAN) 150 MG tablet Take one tablet once then repeat in 2 days 1 tablet 0   glucose blood (ACCU-CHEK GUIDE) test strip Use daily Dx E11.9 100 each 3   HYDROcodone-acetaminophen (NORCO) 10-325 MG tablet Take 1 tablet by mouth 3 (three) times daily as needed for pain.     latanoprost (XALATAN) 0.005 % ophthalmic solution Place 1 drop into both eyes at bedtime.     linaclotide (LINZESS) 145 MCG CAPS capsule Take 1 capsule (145 mcg total) by mouth daily before breakfast. 90 capsule 3   lisinopril (ZESTRIL) 5 MG tablet TAKE ONE (1) TABLET EACH DAY (Patient  taking differently: Take 2.5 mg by mouth daily. TAKE ONE (1) TABLET EACH DAY) 90 tablet 0   meclizine (ANTIVERT) 25 MG tablet Take 50 mg by mouth 2 (two) times daily.     metFORMIN (GLUCOPHAGE) 1000 MG tablet TAKE ONE TABLET BY MOUTH TWICE DAILY WITH MEALS 180 tablet 0   omeprazole (PRILOSEC) 40 MG capsule Take 1 capsule (40 mg total) by mouth daily. TAKE ONE (1) CAPSULE EACH DAY Strength: 40 mg 90 capsule 3   pregabalin (LYRICA) 150 MG capsule Take 1 capsule (150 mg total) by mouth 2 (two) times daily. 60 capsule 2   sitaGLIPtin (JANUVIA) 100 MG tablet Take 1 tablet (100 mg total) by mouth daily. 90 tablet 3   ALPRAZolam (XANAX) 1 MG tablet Take 1 tablet (1 mg total) by mouth at bedtime as needed for anxiety. 30 tablet 2   venlafaxine XR (EFFEXOR-XR) 150 MG 24 hr capsule Take 2 capsules (300 mg total) by mouth at bedtime. 60 capsule 2   No current facility-administered medications for this visit.     Musculoskeletal: Strength & Muscle Tone: within normal limits Gait & Station: normal Patient leans: N/A  Psychiatric Specialty Exam: Review of Systems  Musculoskeletal:  Positive for back pain.  Psychiatric/Behavioral:  The patient is nervous/anxious.   All other systems reviewed and are negative.   Blood pressure 118/82, pulse 88, height 4\' 11"  (1.499 m), weight 144 lb (65.3 kg).Body mass index is 29.08 kg/m.  General Appearance: Casual and Fairly Groomed  Eye Contact:  Good  Speech:  Clear and Coherent  Volume:  Normal  Mood:  Anxious  Affect:  Constricted  Thought Process:  Goal Directed  Orientation:  Full (Time, Place, and Person)  Thought Content: Rumination   Suicidal Thoughts:  No  Homicidal Thoughts:  No  Memory:  Immediate;   Good Recent;   Good Remote;   Good  Judgement:  Good  Insight:  Fair  Psychomotor Activity:  Decreased  Concentration:  Concentration: Good and Attention Span: Good  Recall:  Good  Fund of Knowledge: Good  Language: Good  Akathisia:  No   Handed:  Right  AIMS (if indicated): not done  Assets:  Communication Skills Desire for Improvement Resilience Social Support Talents/Skills  ADL's:  Intact  Cognition: WNL  Sleep:  Good   Screenings: GAD-7    Loss adjuster, chartered Office Visit from 10/29/2022 in [facility] [place] Office Visit from 08/26/2022 in [facility] Office Visit from 08/06/2022 in [facility] [place] Office Visit from 07/10/2022 in [facility] Office Visit from 05/15/2022 in [facility]  Total GAD-7 Score 11 10 10 8 10       PHQ2-9    Flowsheet Row Office Visit from 10/29/2022 in [facility] [place] Office Visit from 08/26/2022 in [facility] Office Visit from 08/06/2022 in [facility] [place] Office Visit from 07/10/2022 in [facility] Office Visit from 05/15/2022 in [facility]  PHQ-2 Total Score 4 3 3 5 4   PHQ-9 Total Score 18 10 13 13 14       Flowsheet Row Office Visit from 08/06/2022 in [facility] [place] Video Visit from 02/06/2022 in [facility] [place] Counselor from 12/05/2021 in [facility] [place]  C-SSRS RISK CATEGORY No Risk No Risk Error: Q3, 4, or 5 should not be populated when Q2 is No        Assessment and Plan: This patient is a 62 year old female with a history of depression and anxiety.  Her main issue seems to be the difficulties with her husband.  She does think the medications are helping to some degree.  She will continue Effexor XR 300 mg daily for depression and Xanax 1 mg at bedtime for anxiety and sleep.  She will return to see me in  3 months  Collaboration of Care: Collaboration of Care: Primary Care Provider AEB notes are shared with PCP on the epic system  Patient/Guardian was advised Release of Information must be obtained prior to any record release in order to collaborate their care with an outside provider. Patient/Guardian was advised if they have not already done so to contact the registration department to sign all necessary forms in order for [place] to release information regarding their care.   Consent: Patient/Guardian gives verbal consent for treatment and assignment of benefits for services provided during this visit. Patient/Guardian expressed understanding and agreed to proceed.    Diannia Ruder, MD 10/29/2022, 10:27 AM

## 2022-10-31 ENCOUNTER — Ambulatory Visit (HOSPITAL_COMMUNITY): Payer: Medicaid Other

## 2022-11-03 ENCOUNTER — Other Ambulatory Visit (HOSPITAL_COMMUNITY): Payer: Self-pay | Admitting: Psychiatry

## 2022-11-07 ENCOUNTER — Institutional Professional Consult (permissible substitution): Payer: Medicaid Other | Admitting: Pulmonary Disease

## 2022-11-15 DIAGNOSIS — E119 Type 2 diabetes mellitus without complications: Secondary | ICD-10-CM | POA: Diagnosis not present

## 2022-11-15 DIAGNOSIS — M545 Low back pain, unspecified: Secondary | ICD-10-CM | POA: Diagnosis not present

## 2022-11-15 DIAGNOSIS — R030 Elevated blood-pressure reading, without diagnosis of hypertension: Secondary | ICD-10-CM | POA: Diagnosis not present

## 2022-11-15 DIAGNOSIS — Z79899 Other long term (current) drug therapy: Secondary | ICD-10-CM | POA: Diagnosis not present

## 2022-11-15 DIAGNOSIS — E213 Hyperparathyroidism, unspecified: Secondary | ICD-10-CM | POA: Diagnosis not present

## 2022-11-15 DIAGNOSIS — Z6826 Body mass index (BMI) 26.0-26.9, adult: Secondary | ICD-10-CM | POA: Diagnosis not present

## 2022-11-27 ENCOUNTER — Encounter: Payer: Self-pay | Admitting: Obstetrics & Gynecology

## 2022-11-27 ENCOUNTER — Other Ambulatory Visit (HOSPITAL_COMMUNITY)
Admission: RE | Admit: 2022-11-27 | Discharge: 2022-11-27 | Disposition: A | Discharge status: Home / Self Care | Payer: Medicaid Other | Source: Ambulatory Visit | Attending: Obstetrics & Gynecology | Admitting: Obstetrics & Gynecology

## 2022-11-27 ENCOUNTER — Ambulatory Visit (INDEPENDENT_AMBULATORY_CARE_PROVIDER_SITE_OTHER): Payer: Medicaid Other | Admitting: Family Medicine

## 2022-11-27 ENCOUNTER — Ambulatory Visit (INDEPENDENT_AMBULATORY_CARE_PROVIDER_SITE_OTHER): Payer: Medicaid Other | Admitting: Obstetrics & Gynecology

## 2022-11-27 ENCOUNTER — Encounter: Payer: Self-pay | Admitting: Family Medicine

## 2022-11-27 VITALS — BP 111/67 | HR 103 | Ht 58.5 in | Wt 143.0 lb

## 2022-11-27 VITALS — BP 111/71 | HR 91 | Temp 98.2°F | Ht 59.0 in | Wt 142.5 lb

## 2022-11-27 DIAGNOSIS — K219 Gastro-esophageal reflux disease without esophagitis: Secondary | ICD-10-CM

## 2022-11-27 DIAGNOSIS — E1165 Type 2 diabetes mellitus with hyperglycemia: Secondary | ICD-10-CM | POA: Diagnosis not present

## 2022-11-27 DIAGNOSIS — E1159 Type 2 diabetes mellitus with other circulatory complications: Secondary | ICD-10-CM

## 2022-11-27 DIAGNOSIS — E1169 Type 2 diabetes mellitus with other specified complication: Secondary | ICD-10-CM

## 2022-11-27 DIAGNOSIS — N760 Acute vaginitis: Secondary | ICD-10-CM | POA: Diagnosis not present

## 2022-11-27 DIAGNOSIS — N898 Other specified noninflammatory disorders of vagina: Secondary | ICD-10-CM

## 2022-11-27 DIAGNOSIS — E785 Hyperlipidemia, unspecified: Secondary | ICD-10-CM | POA: Diagnosis not present

## 2022-11-27 DIAGNOSIS — Z7984 Long term (current) use of oral hypoglycemic drugs: Secondary | ICD-10-CM | POA: Diagnosis not present

## 2022-11-27 DIAGNOSIS — F411 Generalized anxiety disorder: Secondary | ICD-10-CM | POA: Diagnosis not present

## 2022-11-27 DIAGNOSIS — J4489 Other specified chronic obstructive pulmonary disease: Secondary | ICD-10-CM | POA: Diagnosis not present

## 2022-11-27 DIAGNOSIS — L309 Dermatitis, unspecified: Secondary | ICD-10-CM | POA: Diagnosis not present

## 2022-11-27 DIAGNOSIS — I152 Hypertension secondary to endocrine disorders: Secondary | ICD-10-CM | POA: Diagnosis not present

## 2022-11-27 DIAGNOSIS — F321 Major depressive disorder, single episode, moderate: Secondary | ICD-10-CM | POA: Diagnosis not present

## 2022-11-27 LAB — BAYER DCA HB A1C WAIVED: HB A1C (BAYER DCA - WAIVED): 7.4 % — ABNORMAL HIGH (ref 4.8–5.6)

## 2022-11-27 MED ORDER — SEMAGLUTIDE(0.25 OR 0.5MG/DOS) 2 MG/3ML ~~LOC~~ SOPN
PEN_INJECTOR | SUBCUTANEOUS | 3 refills | Status: DC
Start: 2022-11-27 — End: 2022-12-10

## 2022-11-27 MED ORDER — EMPAGLIFLOZIN 25 MG PO TABS
25.0000 mg | ORAL_TABLET | Freq: Every day | ORAL | 3 refills | Status: DC
Start: 2022-11-27 — End: 2023-09-12

## 2022-11-27 NOTE — Progress Notes (Signed)
Established Patient Office Visit  Subjective   Patient ID: Charlotte Perez, female    DOB: 09/05/1960  Age: 62 y.o. MRN: [id number]  Chief Complaint  Patient presents with   Medical Management of Chronic Issues   Diabetes   Hypertension    HPI T2DM Pt presents for follow up evaluation of Type 2 diabetes mellitus. Patient denies foot ulcerations, increased appetite, nausea, polydipsia, polyuria, visual disturbances, vomiting, and weight loss.  Current diabetic medications include jardiance, januvia, metformin Compliant with meds - Yes  Current monitoring regimen:  2-3x a week Home blood sugar records: fasting 110-140s Any episodes of hypoglycemia? no  Current diet: regular, not good lately Current exercise: none  Is She on ACE inhibitor or angiotensin II receptor blocker?  Yes, lisinopril Is She on statin? Yes atorvastatin  2. HTN Complaint with meds - Yes Current Medications - lisinopril Pertinent ROS:  Headache - No Fatigue - No Visual Disturbances - No Chest pain - No Dyspnea - No Palpitations - No LE edema - No  3. HLD On atorvastatin. Regular diet. No exercise. Denies side effects.   4. GERD Compliant with medications - No Current medications - omeprazole.  Sore throat - No Voice change - No Hemoptysis - No Dysphagia or dyspepsia - No Water brash - Yes Red Flags (weight loss, hematochezia, melena, weight loss, early satiety, fevers, odynophagia, or persistent vomiting) - No  5. COPD/asthma Reports stable, well controlled. No cough. No shortness of breath or wheezing. Compliant with breztri.   6. Depression/anxiety Managed by [facility]. Reports stable. Denies SI.      11/27/2022    9:53 AM 10/29/2022    9:57 AM 08/26/2022    9:20 AM  Depression screen PHQ 2/9  Decreased Interest 2  1  Down, Depressed, Hopeless 1  2  PHQ - 2 Score 3  3  Altered sleeping 2  1  Tired, decreased energy 1  0  Change in appetite 0  1  Feeling bad or failure about yourself   2  3  Trouble concentrating 1  1  Moving slowly or fidgety/restless 0  1  Suicidal thoughts 0  0  PHQ-9 Score 9  10  Difficult doing work/chores Somewhat difficult  Somewhat difficult     Information is confidential and restricted. Go to Review Flowsheets to unlock data.      11/27/2022    9:52 AM 10/29/2022    9:58 AM 08/26/2022    9:24 AM 08/06/2022    8:52 AM  GAD 7 : Generalized Anxiety Score  Nervous, Anxious, on Edge 1  2   Control/stop worrying 2  2   Worry too much - different things 2  1   Trouble relaxing 3  2   Restless 1  1   Easily annoyed or irritable 1  1   Afraid - awful might happen 1  1   Total GAD 7 Score 11  10   Anxiety Difficulty Somewhat difficult  Somewhat difficult      Information is confidential and restricted. Go to Review Flowsheets to unlock data.       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    Objective:     BP 111/71   Pulse 91   Temp 98.2 F (36.8 C) (Temporal)   Ht 4\' 11"  (1.499 m)   Wt 142 lb 8 oz (64.6 kg)   SpO2 97%   BMI 28.78 kg/m  BP Readings from Last 3 Encounters:  11/27/22 111/71  10/16/22 99/60  09/10/22 113/75   No data  found.   Physical Exam Vitals and nursing note reviewed.  Constitutional:      General: She is not in acute distress.    Appearance: Normal appearance. She is not ill-appearing, toxic-appearing or diaphoretic.  HENT:     Nose: Nose normal.     Mouth/Throat:     Mouth: Mucous membranes are moist.     Pharynx: Oropharynx is clear. No oropharyngeal exudate or posterior oropharyngeal erythema.  Neck:     Vascular: No carotid bruit.  Cardiovascular:     Rate and Rhythm: Normal rate and regular rhythm.     Pulses: Normal pulses.     Heart sounds: Normal heart sounds. No murmur heard. Pulmonary:     Effort: Pulmonary effort is normal. No respiratory distress.     Breath sounds: Normal breath sounds.  Abdominal:     General: Bowel sounds are normal. There is no distension.     Palpations: Abdomen is soft. There is no mass.     Tenderness: There is no abdominal tenderness. There is no guarding or rebound.  Musculoskeletal:     Cervical back: Neck supple. No rigidity.     Right lower leg: No edema.     Left lower leg: No edema.  Skin:    General: Skin is warm and dry.  Neurological:     General: No focal deficit present.     Mental Status: She is alert and oriented to person, place, and time.  Psychiatric:        Mood and Affect: Mood normal.        Behavior: Behavior normal.        Thought Content: Thought content normal.        Judgment: Judgment normal.      No results found for any visits on 11/27/22.    The 10-year ASCVD risk score (Arnett DK, et al., 2019) is: 5.9%    Assessment & Plan:   Alathea was seen today for medical management of chronic issues, diabetes and hypertension.  Diagnoses and all orders for this visit:  Type 2 diabetes mellitus with hyperglycemia, without long-term current use of insulin (HCC) A1c 7.4 today, not at goal of <7. Medication changes today: start ozempic. Discontinue januvia. Continue metformin and jardiance. She is on an ACE/ARB and  statin. Eye exam: reminded to schedule. Foot exam: UTD. Urine micro: UTD. Diet and exercise.  -     Bayer DCA Hb A1c  Waived -     Semaglutide,0.25 or 0.5MG /DOS, 2 MG/3ML SOPN; Inject 0.25 mg into the skin once a week for 4 weeks. Then increase to 0.5 mg weekly. -     empagliflozin (JARDIANCE) 25 MG TABS tablet; Take 1 tablet (25 mg total) by mouth daily.  Hypertension associated with diabetes (HCC) Well controlled on current regimen.   Hyperlipidemia associated with type 2 diabetes mellitus (HCC) On statin. Last LDL 77. Diet and exercise. Will recheck fasting at next visit.   Gastroesophageal reflux disease without esophagitis Well controlled on current regimen.   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   Depression Anxiety Stabled. Denies SI. Managed by [facility].    Return in about 3 months (around 02/26/2023) for chronic follow up.   The patient indicates understanding of these issues and agrees with the plan.  Gabriel Earing, FNP

## 2022-11-27 NOTE — Progress Notes (Signed)
GYN VISIT Patient name: Charlotte Perez MRN [id number]  Date of birth: 02-27-1961 Chief Complaint:   Follow-up  History of Present Illness:   Charlotte Perez is a 62 y.o. G3P3003 PM, PH female being seen today for follow up regarding:  -Vulvar dermatitis: At her last visit, she was transitioned to [facility] and treated for yeast and BV.  Today she notes that she is not itching, but still feels like she has an infection.  Notes thick white on the outside of the labia.  Pt recently used a douche and noted discomfort.  She also was concerned that symptoms were due to self-stimulation- denies use of toys other objects.  No LMP recorded. Patient has had a hysterectomy.    Review of Systems:   Pertinent items are noted in HPI Denies fever/chills, dizziness, headaches, visual disturbances, fatigue, shortness of breath, chest pain, abdominal pain, vomiting, no problems with bowel movements, urination, or intercourse unless otherwise stated above.  Pertinent History Reviewed:   Past Surgical History:  Procedure Laterality Date   ABDOMINAL HYSTERECTOMY     total hysterectomy   BACK SURGERY     BIOPSY  03/07/2021   Procedure: BIOPSY;  Surgeon: Marguerita Merles, Reuel Boom, MD;  [facility];  Service: Gastroenterology;;   CESAREAN SECTION     x 2   ESOPHAGOGASTRODUODENOSCOPY (EGD) WITH PROPOFOL N/A 03/07/2021   Procedure: ESOPHAGOGASTRODUODENOSCOPY (EGD) WITH PROPOFOL;  Surgeon: Dolores Frame, MD;  [facility];  Service: Gastroenterology;  Laterality: N/A;  9:15   NECK SURGERY      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   Arthritis    Asthma    Bipolar disorder (HCC)    Chronic back pain    Chronic neck pain    Constipation 09/05/2016   COPD (chronic obstructive pulmonary disease) (HCC)    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 Physical Assessment:   Vitals:   11/27/22 1348  BP: 111/67  Pulse: (!) 103  Weight: 143 lb (64.9 kg)  Height: 4' 10.5" (1.486 m)  Body mass index is 29.38 kg/m.       Physical Examination:   General appearance: alert, well appearing, and in no distress  Psych: mood appropriate, normal affect  Skin: warm & dry   Cardiovascular: normal heart rate noted  Respiratory: normal respiratory effort, no distress  Abdomen: soft, non-tender   Pelvic: VULVA: labia appear slightly erythematous with ?scaling vs white discharge in labial folds- no discrete lesions, no loss of labial architecture.  VAGINA: normal appearing vagina with normal color and discharge, no lesions.  Extremities: no edema   Chaperone: Faith Rogue    Assessment & Plan:  1) Vulvar dermatitis, h/o vaginitis -recheck for vaginitis- pt previously had both yeast and BV -encouraged pt to  continue using clobetasol and ween off "other cream" discussed too much may also cause irritation -reviewed vulvar care and encouraged to avoid douching -Further management pending results    Return in about 6 months (around 05/27/2023) for follow up with Dr. Charlotta Newton.   Myna Hidalgo, DO Attending Obstetrician & Gynecologist, [facility] for [employer], [facility]

## 2022-11-28 ENCOUNTER — Ambulatory Visit: Payer: Medicaid Other | Admitting: Podiatry

## 2022-11-29 ENCOUNTER — Other Ambulatory Visit (HOSPITAL_COMMUNITY): Payer: Self-pay

## 2022-11-29 ENCOUNTER — Ambulatory Visit (INDEPENDENT_AMBULATORY_CARE_PROVIDER_SITE_OTHER): Payer: Medicaid Other

## 2022-11-29 ENCOUNTER — Telehealth: Payer: Self-pay

## 2022-11-29 DIAGNOSIS — E1165 Type 2 diabetes mellitus with hyperglycemia: Secondary | ICD-10-CM | POA: Diagnosis not present

## 2022-11-29 LAB — HM DIABETES EYE EXAM

## 2022-11-29 LAB — CERVICOVAGINAL ANCILLARY ONLY
Bacterial Vaginitis (gardnerella): NEGATIVE
Candida Glabrata: POSITIVE — AB
Candida Vaginitis: POSITIVE — AB
Comment: NEGATIVE
Comment: NEGATIVE
Comment: NEGATIVE

## 2022-11-29 NOTE — Progress Notes (Signed)
Charlotte Perez arrived 11/29/2022 and has given verbal consent to obtain images and complete their overdue diabetic retinal screening.  The images have been sent to an ophthalmologist or optometrist for review and interpretation.  Results will be sent back to Gabriel Earing, FNP for review.  Patient has been informed they will be contacted when we receive the results via telephone or MyChart

## 2022-11-29 NOTE — Telephone Encounter (Signed)
Pharmacy Patient Advocate Encounter   Received notification from CoverMyMeds that prior authorization for [facility] is required/requested.   Insurance verification completed.   The patient is insured through [facility] .   Per test claim: PA required; PA submitted to [facility] MEDICAID via CoverMyMeds Key/confirmation #/EOC Key: [id number]         Status is pending

## 2022-12-02 ENCOUNTER — Other Ambulatory Visit (HOSPITAL_COMMUNITY): Payer: Self-pay

## 2022-12-02 ENCOUNTER — Other Ambulatory Visit: Payer: Self-pay | Admitting: Obstetrics & Gynecology

## 2022-12-02 DIAGNOSIS — N760 Acute vaginitis: Secondary | ICD-10-CM

## 2022-12-02 MED ORDER — FLUCONAZOLE 150 MG PO TABS
ORAL_TABLET | ORAL | 2 refills | Status: DC
Start: 2022-12-02 — End: 2023-06-12

## 2022-12-02 NOTE — Progress Notes (Signed)
Called patient with results ye still present Plan for Diflucan every other day for 1 week then weekly tablet for 2 to 3 months Follow-up as scheduled  Myna Hidalgo, DO Attending Obstetrician & Gynecologist, [facility] for [employer], [facility]

## 2022-12-02 NOTE — Telephone Encounter (Signed)
Pharmacy Patient Advocate Encounter  Received notification from [facility] MEDICAID that Prior Authorization for [facility] has been APPROVED from 9.6.24 to 9.6.25. Ran test claim, Copay is $4.00. This test claim was processed through [facility]- copay amounts may vary at other pharmacies due to pharmacy/plan contracts, or as the patient moves through the different stages of their insurance plan.   PA #/Case ID/Reference #: Key: [id number]

## 2022-12-10 ENCOUNTER — Encounter: Payer: Self-pay | Admitting: Family Medicine

## 2022-12-10 ENCOUNTER — Telehealth (INDEPENDENT_AMBULATORY_CARE_PROVIDER_SITE_OTHER): Payer: Medicaid Other | Admitting: Family Medicine

## 2022-12-10 DIAGNOSIS — T50905A Adverse effect of unspecified drugs, medicaments and biological substances, initial encounter: Secondary | ICD-10-CM

## 2022-12-10 DIAGNOSIS — R112 Nausea with vomiting, unspecified: Secondary | ICD-10-CM

## 2022-12-10 MED ORDER — ONDANSETRON 8 MG PO TBDP: 8.0000 mg | ORAL_TABLET | Freq: Four times a day (QID) | ORAL | 1 refills | Status: DC | PRN

## 2022-12-10 NOTE — Progress Notes (Signed)
Subjective:    Patient ID: Charlotte Perez, female    DOB: Apr 14, 1960, 62 y.o.   MRN: [id number]   HPI: Charlotte Perez is a 62 y.o. female presenting for nauseated, Stomach hurting. Onset 3 days ago. Can't eat. Holding fluids down. Points to epigastrum. Took her first Ozempic shot the day before her symptoms started.       11/27/2022    9:53 AM 10/29/2022    9:57 AM 08/26/2022    9:20 AM 08/06/2022    8:52 AM 07/10/2022   11:38 AM  Depression screen PHQ 2/9  Decreased Interest 2  1  2   Down, Depressed, Hopeless 1  2  3   PHQ - 2 Score 3  3  5   Altered sleeping 2  1  2   Tired, decreased energy 1  0  3  Change in appetite 0  1  0  Feeling bad or failure about yourself  2  3  3   Trouble concentrating 1  1  0  Moving slowly or fidgety/restless 0  1  0  Suicidal thoughts 0  0  0  PHQ-9 Score 9  10  13   Difficult doing work/chores Somewhat difficult  Somewhat difficult       Information is confidential and restricted. Go to Review Flowsheets to unlock data.     Relevant past medical, surgical, family and social history reviewed and updated as indicated.  Interim medical history since our last visit reviewed. Allergies and medications reviewed and updated.  ROS:  Review of Systems  Constitutional: Negative.   HENT: Negative.    Eyes:  Negative for visual disturbance.  Respiratory:  Negative for shortness of breath.   Cardiovascular:  Negative for chest pain.  Gastrointestinal:  Positive for abdominal pain and nausea.  Musculoskeletal:  Negative for arthralgias.     Social History   Tobacco Use  Smoking Status Never   Passive exposure: Yes  Smokeless Tobacco Never       Objective:     Wt Readings from Last 3 Encounters:  11/27/22 143 lb (64.9 kg)  11/27/22 142 lb 8 oz (64.6 kg)  10/16/22 145 lb (65.8 kg)     Exam deferred. Video visit performed. Pt. Points to epigastrum as point of tenderness.   Assessment & Plan:   1. Medication reaction, initial  encounter   2. Nausea and vomiting in adult     Meds ordered this encounter  Medications   ondansetron (ZOFRAN-ODT) 8 MG disintegrating tablet    Sig: Take 1 tablet (8 mg total) by mouth every 6 (six) hours as needed for nausea or vomiting.    Dispense:  20 tablet    Refill:  1    No orders of the defined types were placed in this encounter.     Diagnoses and all orders for this visit:  Medication reaction, initial encounter  Nausea and vomiting in adult  Other orders -     ondansetron (ZOFRAN-ODT) 8 MG disintegrating tablet; Take 1 tablet (8 mg total) by mouth every 6 (six) hours as needed for nausea or vomiting.   DC ozempic until further evaluation with PCP  Virtual Visit  Note  I discussed the limitations, risks, security and privacy concerns of performing an evaluation and management service by video and the availability of in person appointments. The patient was identified with two identifiers. Pt.expressed understanding and agreed to proceed. Pt. Is at home. Dr. Darlyn Read is in his office.  Follow Up Instructions:  I discussed the assessment and treatment plan with the patient. The patient was provided an opportunity to ask questions and all were answered. The patient agreed with the plan and demonstrated an understanding of the instructions.   The patient was advised to call back or seek an in-person evaluation if the symptoms worsen or if the condition fails to improve as anticipated.   Total minutes contact time: 14   Follow up plan: Return in about 2 weeks (around 12/24/2022), or with PCP.  Mechele Claude, MD [facility]

## 2022-12-13 DIAGNOSIS — Z6826 Body mass index (BMI) 26.0-26.9, adult: Secondary | ICD-10-CM | POA: Diagnosis not present

## 2022-12-13 DIAGNOSIS — E213 Hyperparathyroidism, unspecified: Secondary | ICD-10-CM | POA: Diagnosis not present

## 2022-12-13 DIAGNOSIS — M545 Low back pain, unspecified: Secondary | ICD-10-CM | POA: Diagnosis not present

## 2022-12-13 DIAGNOSIS — E119 Type 2 diabetes mellitus without complications: Secondary | ICD-10-CM | POA: Diagnosis not present

## 2022-12-13 DIAGNOSIS — R030 Elevated blood-pressure reading, without diagnosis of hypertension: Secondary | ICD-10-CM | POA: Diagnosis not present

## 2022-12-13 DIAGNOSIS — Z79899 Other long term (current) drug therapy: Secondary | ICD-10-CM | POA: Diagnosis not present

## 2022-12-15 ENCOUNTER — Other Ambulatory Visit: Payer: Self-pay | Admitting: Family Medicine

## 2022-12-18 ENCOUNTER — Other Ambulatory Visit: Payer: Self-pay | Admitting: Family Medicine

## 2022-12-18 DIAGNOSIS — E1142 Type 2 diabetes mellitus with diabetic polyneuropathy: Secondary | ICD-10-CM

## 2022-12-23 ENCOUNTER — Telehealth: Payer: Self-pay | Admitting: Family Medicine

## 2022-12-23 DIAGNOSIS — E1165 Type 2 diabetes mellitus with hyperglycemia: Secondary | ICD-10-CM

## 2022-12-23 MED ORDER — SITAGLIPTIN PHOSPHATE 100 MG PO TABS
100.0000 mg | ORAL_TABLET | Freq: Every day | ORAL | 3 refills | Status: DC
Start: 2022-12-23 — End: 2023-03-06

## 2022-12-23 NOTE — Telephone Encounter (Signed)
Pt aware of provider feedback and voiced understanding. 

## 2022-12-23 NOTE — Telephone Encounter (Signed)
Seems like she is having pretty significant side effects based off the OV note with [facility]. She can discontinue ozempic. I have sent in [place] for her to restart 1 week after her last ozempic injection.

## 2022-12-24 ENCOUNTER — Ambulatory Visit: Payer: Medicaid Other | Admitting: Podiatry

## 2023-01-05 ENCOUNTER — Other Ambulatory Visit: Payer: Self-pay | Admitting: Family Medicine

## 2023-01-13 DIAGNOSIS — Z79899 Other long term (current) drug therapy: Secondary | ICD-10-CM | POA: Diagnosis not present

## 2023-01-13 DIAGNOSIS — E213 Hyperparathyroidism, unspecified: Secondary | ICD-10-CM | POA: Diagnosis not present

## 2023-01-13 DIAGNOSIS — E119 Type 2 diabetes mellitus without complications: Secondary | ICD-10-CM | POA: Diagnosis not present

## 2023-01-13 DIAGNOSIS — M545 Low back pain, unspecified: Secondary | ICD-10-CM | POA: Diagnosis not present

## 2023-01-13 DIAGNOSIS — R030 Elevated blood-pressure reading, without diagnosis of hypertension: Secondary | ICD-10-CM | POA: Diagnosis not present

## 2023-01-13 DIAGNOSIS — E559 Vitamin D deficiency, unspecified: Secondary | ICD-10-CM | POA: Diagnosis not present

## 2023-01-13 DIAGNOSIS — Z6826 Body mass index (BMI) 26.0-26.9, adult: Secondary | ICD-10-CM | POA: Diagnosis not present

## 2023-01-14 ENCOUNTER — Other Ambulatory Visit (HOSPITAL_COMMUNITY): Payer: Self-pay | Admitting: Psychiatry

## 2023-01-14 ENCOUNTER — Other Ambulatory Visit: Payer: Self-pay | Admitting: Family Medicine

## 2023-01-22 ENCOUNTER — Other Ambulatory Visit (INDEPENDENT_AMBULATORY_CARE_PROVIDER_SITE_OTHER): Payer: Self-pay | Admitting: Gastroenterology

## 2023-01-22 DIAGNOSIS — K581 Irritable bowel syndrome with constipation: Secondary | ICD-10-CM

## 2023-01-29 ENCOUNTER — Encounter (HOSPITAL_COMMUNITY): Payer: Self-pay | Admitting: Psychiatry

## 2023-01-29 ENCOUNTER — Ambulatory Visit (INDEPENDENT_AMBULATORY_CARE_PROVIDER_SITE_OTHER): Payer: Medicaid Other | Admitting: Psychiatry

## 2023-01-29 VITALS — BP 127/85 | HR 101 | Ht 59.0 in | Wt 136.2 lb

## 2023-01-29 DIAGNOSIS — F321 Major depressive disorder, single episode, moderate: Secondary | ICD-10-CM | POA: Diagnosis not present

## 2023-01-29 DIAGNOSIS — F411 Generalized anxiety disorder: Secondary | ICD-10-CM | POA: Diagnosis not present

## 2023-01-29 MED ORDER — VENLAFAXINE HCL ER 150 MG PO CP24: 300.0000 mg | ORAL_CAPSULE | Freq: Every day | ORAL | 2 refills | Status: DC

## 2023-01-29 MED ORDER — ALPRAZOLAM 1 MG PO TABS: 1.0000 mg | ORAL_TABLET | Freq: Every evening | ORAL | 2 refills | Status: DC | PRN

## 2023-01-29 NOTE — Progress Notes (Signed)
BH MD/PA/NP OP Progress Note  01/29/2023 10:48 AM Charlotte Perez  MRN:  [id number]  Chief Complaint:  Chief Complaint  Patient presents with   Depression   Anxiety   Follow-up   HPI: This patient is a 62 year old married white female who lives with her husband in [place].  She is on disability.  The patient returns for follow-up after 3 months regarding her depression and anxiety.  She is still struggling with dealing with her husband who has depression and early onset dementia and recently had a knee replacement surgery.  He can be irritable demanding and controlling.  She states that she tries to put up with that the best she can.  She denies significant depression.  The patient's husband does not want her to have contact with her children grandchildren and great-grandchildren but she sees them occasionally.  She does keep in touch with friends surreptitiously.  This does sound difficult but she seems to be managing okay.  She states that the medications for depression and anxiety have been helpful and she denies any thoughts of self-harm or suicide Visit Diagnosis:    ICD-10-CM   1. Moderate single current episode of major depressive disorder (HCC)  F32.1     2. 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   DDD (degenerative disc disease), cervical    DDD (degenerative disc disease), lumbar    Depression    Diabetes mellitus (HCC) 06/27/2010   Diabetes mellitus, type II (HCC)    Gallbladder sludge    GERD (gastroesophageal reflux disease)    Hyperlipidemia    Hypertension    Iron  deficiency anemia 09/05/2016   Lumbar radiculopathy    Mole (skin)    Neuropathy of foot    Renal cyst    Shingles    Skin lesions, generalized    1.2 CM FLAT FAWN COLOR AT THE T10 AREA JUST RIGHT OF SPINE     Past Surgical History:  Procedure Laterality Date   ABDOMINAL HYSTERECTOMY     total hysterectomy   BACK SURGERY     BIOPSY  03/07/2021   Procedure: BIOPSY;  Surgeon: Dolores Frame, MD;  [facility];  Service: Gastroenterology;;   CESAREAN SECTION     x 2   ESOPHAGOGASTRODUODENOSCOPY (EGD) WITH PROPOFOL N/A 03/07/2021   Procedure: ESOPHAGOGASTRODUODENOSCOPY (EGD) WITH PROPOFOL;  Surgeon: Dolores Frame, MD;  Location: [facility];  Service: Gastroenterology;  Laterality: N/A;  9:15   NECK SURGERY      Family Psychiatric History: See below  Family History:  Family History  Problem Relation Age of Onset   ADD / ADHD Other    COPD Father    Alcohol abuse Father    Depression Daughter    Alcohol abuse Mother    COPD Mother        on oxygen   Colon cancer Paternal Grandfather    Heart disease Sister    Heart disease Brother    Arthritis Sister        knee replacement  Diabetes Maternal Grandmother    Diabetes Sister     Social History:  Social History   Socioeconomic History   Marital status: Legally Separated    Spouse name: Ronnie   Number of children: 3   Years of education: 9   Highest education level: 9th grade  Occupational History   Occupation: disablility  Tobacco Use   Smoking status: Never    Passive exposure: Yes   Smokeless tobacco: Never  Vaping Use   Vaping status: Never Used  Substance and Sexual Activity   Alcohol use: No   Drug use: No   Sexual activity: Not Currently    Birth control/protection: Surgical    Comment: hyst  Other Topics Concern   Not on file  Social History Narrative   Not on file   Social Determinants of Health   Financial Resource Strain: Medium Risk (07/10/2022)    Overall Financial Resource Strain (CARDIA)    Difficulty of Paying Living Expenses: Somewhat hard  Food Insecurity: Food Insecurity Present (07/10/2022)   Hunger Vital Sign    Worried About Running Out of Food in the Last Year: Often true    Ran Out of Food in the Last Year: Sometimes true  Transportation Needs: Unmet Transportation Needs (07/10/2022)   PRAPARE - Transportation    Lack of Transportation (Medical): No    Lack of Transportation (Non-Medical): Yes  Physical Activity: Insufficiently Active (07/10/2022)   Exercise Vital Sign    Days of Exercise per Week: 2 days    Minutes of Exercise per Session: 20 min  Stress: Stress Concern Present (07/10/2022)   [employer] of Occupational Health - Occupational Stress Questionnaire    Feeling of Stress : To some extent  Social Connections: Socially Isolated (07/10/2022)   Social Connection and Isolation Panel [NHANES]    Frequency of Communication with Friends and Family: Once a week    Frequency of Social Gatherings with Friends and Family: Never    Attends Religious Services: Never    Database administrator or Organizations: No    Attends Banker Meetings: Never    Marital Status: Married    Allergies:  Allergies  Allergen Reactions   Cymbalta [Duloxetine Hcl]     Per pt it started making her mouth tingle and could not taste anything   Gabapentin     Itching, fidgety   Meloxicam Other (See Comments)    Tongue tingling, and lost sense of taste.   Tramadol Other (See Comments)    Tongue tingling, and lost sense of taste.    Metabolic Disorder Labs: Lab Results  Component Value Date   HGBA1C 7.4 (H) 11/27/2022   No results found for: "PROLACTIN" Lab Results  Component Value Date   CHOL 162 02/07/2022   TRIG 81 02/07/2022   HDL 70 02/07/2022   CHOLHDL 2.3 02/07/2022   LDLCALC 77 02/07/2022   LDLCALC 69 05/11/2020   Lab Results  Component Value Date   TSH 0.644 08/26/2022   TSH 1.44 11/16/2020     Therapeutic Level Labs: No results found for: "LITHIUM" No results found for: "VALPROATE" No results found for: "CBMZ"  Current Medications: Current Outpatient Medications  Medication Sig Dispense Refill   ACCU-CHEK FASTCLIX LANCETS MISC EVERY DAY 102 each 2   albuterol (PROVENTIL) (2.5 MG/3ML) 0.083% nebulizer solution Take 3 mLs (2.5 mg total) by nebulization every 6 (six) hours as needed for wheezing or shortness of breath. 75 mL 3   albuterol (VENTOLIN HFA) 108 (90  Base) MCG/ACT inhaler Inhale 2 puffs into the lungs every 6 (six) hours as needed for wheezing or shortness of breath. 8 g 0   atorvastatin (LIPITOR) 40 MG tablet TAKE ONE (1) TABLET BY MOUTH EVERY DAY 90 tablet 0   Boric Acid Vaginal ([employer] BORIC ACID) 600 MG SUPP Place 1 suppository vaginally at bedtime. 7 suppository 3   Budeson-Glycopyrrol-Formoterol (BREZTRI AEROSPHERE) 160-9-4.8 MCG/ACT AERO Inhale 2 puffs into the lungs 2 (two) times daily. 10.7 g 11   clobetasol ointment (TEMOVATE) 0.05 % Pea-sized amount to affected area as needed- at most nightly 30 g 1   cycloSPORINE (RESTASIS) 0.05 % ophthalmic emulsion Place 1 drop into both eyes at bedtime.     dicyclomine (BENTYL) 10 MG capsule Take 1 capsule (10 mg total) by mouth 2 times daily at 12 noon and 4 pm. 60 capsule 2   empagliflozin (JARDIANCE) 25 MG TABS tablet Take 1 tablet (25 mg total) by mouth daily. 90 tablet 3   ferrous sulfate 324 MG TBEC Take 324 mg by mouth.     fluconazole (DIFLUCAN) 150 MG tablet Take one tablet every other day for one week, then weekly for 2 mos 12 tablet 2   glucose blood (ACCU-CHEK GUIDE) test strip Use daily Dx E11.9 100 each 3   HYDROcodone-acetaminophen (NORCO) 10-325 MG tablet Take 1 tablet by mouth 3 (three) times daily as needed for pain.     latanoprost (XALATAN) 0.005 % ophthalmic solution Place 1 drop into both eyes at bedtime.     LINZESS 145 MCG CAPS capsule TAKE 1 CAPSULE BY MOUTH EVERY MORNING BEFORE BREAKFAST 90 capsule  3   lisinopril (ZESTRIL) 5 MG tablet TAKE 1/2 TABLET BY MOUTH ONCE DAILY 90 tablet 0   meclizine (ANTIVERT) 25 MG tablet Take 50 mg by mouth 2 (two) times daily.     metFORMIN (GLUCOPHAGE) 1000 MG tablet TAKE ONE TABLET BY MOUTH TWICE DAILY WITH MEALS 180 tablet 0   omeprazole (PRILOSEC) 40 MG capsule Take 1 capsule (40 mg total) by mouth daily. TAKE ONE (1) CAPSULE EACH DAY Strength: 40 mg 90 capsule 3   ondansetron (ZOFRAN-ODT) 8 MG disintegrating tablet TAKE 1 TABLET BY MOUTH EVERY 6 HOURS AS NEEDED FOR NAUSEA & VOMITING 20 tablet 1   pregabalin (LYRICA) 150 MG capsule Take 1 capsule (150 mg total) by mouth 2 (two) times daily. 60 capsule 2   sitaGLIPtin (JANUVIA) 100 MG tablet Take 1 tablet (100 mg total) by mouth daily. 90 tablet 3   ALPRAZolam (XANAX) 1 MG tablet Take 1 tablet (1 mg total) by mouth at bedtime as needed for anxiety. 30 tablet 2   venlafaxine XR (EFFEXOR-XR) 150 MG 24 hr capsule Take 2 capsules (300 mg total) by mouth at bedtime. 60 capsule 2   No current facility-administered medications for this visit.     Musculoskeletal: Strength & Muscle Tone: within normal limits Gait & Station: normal Patient leans: N/A  Psychiatric Specialty Exam: Review of Systems  All other systems reviewed and are negative.   Blood pressure 127/85, pulse (!) 101, height 4\' 11"  (1.499 m), weight 136 lb 3.2 oz (61.8 kg), SpO2 97%.Body mass index is 27.51 kg/m.  General Appearance: Casual and Fairly Groomed  Eye Contact:  Good  Speech:  Clear and Coherent  Volume:  Normal  Mood:  Euthymic  Affect:  Congruent  Thought Process:  Goal Directed  Orientation:  Full (Time, Place, and Person)  Thought Content: WDL   Suicidal Thoughts:  No  Homicidal Thoughts:  No  Memory:  Immediate;   Good Recent;   Good Remote;   Fair  Judgement:  Good  Insight:  Good  Psychomotor Activity:  Normal  Concentration:  Concentration: Fair and Attention Span: Fair  Recall:  Fair  Fund of Knowledge: Good   Language: Good  Akathisia:  No  Handed:  Right  AIMS (if indicated): not done  Assets:  Communication Skills Desire for Improvement Physical Health Resilience Social Support Talents/Skills  ADL's:  Intact  Cognition: WNL  Sleep:  Fair   Screenings: GAD-7    Garment/textile technologist Visit from 01/29/2023 in [facility] [place] Office Visit from 11/27/2022 in [facility] Office Visit from 10/29/2022 in [facility] [place] Office Visit from 08/26/2022 in [facility] Office Visit from 08/06/2022 in [facility] [place]  Total GAD-7 Score 6 11 11 10 10       PHQ2-9    Flowsheet Row Office Visit from 01/29/2023 in [facility] [place] Office Visit from 11/27/2022 in [facility] Office Visit from 10/29/2022 in [facility] [place] Office Visit from 08/26/2022 in [facility] Office Visit from 08/06/2022 in [facility] Total Score 5 3 4 3 3   PHQ-9 Total Score 13 9 18 10 13       Flowsheet Row Office Visit from 08/06/2022 in [facility] [place] Video Visit from 02/06/2022 in [facility] [place] Counselor from 12/05/2021 in [facility] [place]  C-SSRS RISK CATEGORY No Risk No Risk Error: Q3, 4, or 5 should not be populated when Q2 is No        Assessment and Plan: This patient is a 62 year old female with a history of depression and anxiety.  Again she is struggling with her husband but seems to be holding her own.  She is standing up for herself more often.  She continues to think the medications are helping to some degree.  She will continue Effexor XR  300 mg daily for depression and Xanax 1 mg at bedtime for anxiety and sleep.  She will return to see me in 3 months  Collaboration of Care: Collaboration of Care: Primary Care Provider AEB notes are shared with PCP on the epic system  Patient/Guardian was advised Release of Information must be obtained prior to any record release in order to collaborate their care with an outside provider. Patient/Guardian was advised if they have not already done so to contact the registration department to sign all necessary forms in order for [place] to release information regarding their care.   Consent: Patient/Guardian gives verbal consent for treatment and assignment of benefits for services provided during this visit. Patient/Guardian expressed understanding and agreed to proceed.    Diannia Ruder, MD 01/29/2023, 10:48 AM

## 2023-02-12 ENCOUNTER — Other Ambulatory Visit (HOSPITAL_COMMUNITY): Payer: Self-pay | Admitting: Psychiatry

## 2023-02-13 DIAGNOSIS — Z79899 Other long term (current) drug therapy: Secondary | ICD-10-CM | POA: Diagnosis not present

## 2023-02-13 DIAGNOSIS — E213 Hyperparathyroidism, unspecified: Secondary | ICD-10-CM | POA: Diagnosis not present

## 2023-02-13 DIAGNOSIS — Z6827 Body mass index (BMI) 27.0-27.9, adult: Secondary | ICD-10-CM | POA: Diagnosis not present

## 2023-02-13 DIAGNOSIS — E119 Type 2 diabetes mellitus without complications: Secondary | ICD-10-CM | POA: Diagnosis not present

## 2023-02-13 DIAGNOSIS — M545 Low back pain, unspecified: Secondary | ICD-10-CM | POA: Diagnosis not present

## 2023-02-13 DIAGNOSIS — E559 Vitamin D deficiency, unspecified: Secondary | ICD-10-CM | POA: Diagnosis not present

## 2023-02-17 DIAGNOSIS — Z79899 Other long term (current) drug therapy: Secondary | ICD-10-CM | POA: Diagnosis not present

## 2023-02-24 ENCOUNTER — Other Ambulatory Visit: Payer: Self-pay | Admitting: Family Medicine

## 2023-02-24 DIAGNOSIS — E1165 Type 2 diabetes mellitus with hyperglycemia: Secondary | ICD-10-CM

## 2023-03-06 ENCOUNTER — Encounter: Payer: Self-pay | Admitting: Family Medicine

## 2023-03-06 ENCOUNTER — Ambulatory Visit (INDEPENDENT_AMBULATORY_CARE_PROVIDER_SITE_OTHER): Payer: Medicaid Other

## 2023-03-06 ENCOUNTER — Ambulatory Visit (INDEPENDENT_AMBULATORY_CARE_PROVIDER_SITE_OTHER): Payer: Medicaid Other | Admitting: Family Medicine

## 2023-03-06 VITALS — BP 116/74 | HR 89 | Temp 98.4°F | Ht 59.0 in | Wt 134.4 lb

## 2023-03-06 DIAGNOSIS — E1165 Type 2 diabetes mellitus with hyperglycemia: Secondary | ICD-10-CM

## 2023-03-06 DIAGNOSIS — E1169 Type 2 diabetes mellitus with other specified complication: Secondary | ICD-10-CM | POA: Diagnosis not present

## 2023-03-06 DIAGNOSIS — G8929 Other chronic pain: Secondary | ICD-10-CM | POA: Insufficient documentation

## 2023-03-06 DIAGNOSIS — K219 Gastro-esophageal reflux disease without esophagitis: Secondary | ICD-10-CM

## 2023-03-06 DIAGNOSIS — J4489 Other specified chronic obstructive pulmonary disease: Secondary | ICD-10-CM | POA: Diagnosis not present

## 2023-03-06 DIAGNOSIS — H9193 Unspecified hearing loss, bilateral: Secondary | ICD-10-CM | POA: Insufficient documentation

## 2023-03-06 DIAGNOSIS — E1159 Type 2 diabetes mellitus with other circulatory complications: Secondary | ICD-10-CM

## 2023-03-06 DIAGNOSIS — E785 Hyperlipidemia, unspecified: Secondary | ICD-10-CM

## 2023-03-06 DIAGNOSIS — F321 Major depressive disorder, single episode, moderate: Secondary | ICD-10-CM | POA: Diagnosis not present

## 2023-03-06 DIAGNOSIS — Z7984 Long term (current) use of oral hypoglycemic drugs: Secondary | ICD-10-CM

## 2023-03-06 DIAGNOSIS — M19012 Primary osteoarthritis, left shoulder: Secondary | ICD-10-CM | POA: Diagnosis not present

## 2023-03-06 DIAGNOSIS — M79602 Pain in left arm: Secondary | ICD-10-CM | POA: Diagnosis not present

## 2023-03-06 DIAGNOSIS — I152 Hypertension secondary to endocrine disorders: Secondary | ICD-10-CM | POA: Diagnosis not present

## 2023-03-06 DIAGNOSIS — Z23 Encounter for immunization: Secondary | ICD-10-CM | POA: Diagnosis not present

## 2023-03-06 DIAGNOSIS — H6121 Impacted cerumen, right ear: Secondary | ICD-10-CM | POA: Diagnosis not present

## 2023-03-06 DIAGNOSIS — H9313 Tinnitus, bilateral: Secondary | ICD-10-CM | POA: Insufficient documentation

## 2023-03-06 LAB — BAYER DCA HB A1C WAIVED: HB A1C (BAYER DCA - WAIVED): 7.7 % — ABNORMAL HIGH (ref 4.8–5.6)

## 2023-03-06 MED ORDER — TIRZEPATIDE 5 MG/0.5ML ~~LOC~~ SOAJ: 5.0000 mg | SUBCUTANEOUS | 0 refills | Status: DC

## 2023-03-06 MED ORDER — TIRZEPATIDE 2.5 MG/0.5ML ~~LOC~~ SOAJ: 2.5000 mg | SUBCUTANEOUS | 0 refills | Status: DC

## 2023-03-06 NOTE — Progress Notes (Signed)
Established Patient Office Visit  Subjective   Patient ID: Charlotte Perez, female    DOB: 1961/03/03  Age: 62 y.o. MRN: [id number]  Chief Complaint  Patient presents with   Medical Management of Chronic Issues   Diabetes    HPI T2DM Pt presents for follow up evaluation of Type 2 diabetes mellitus. Patient denies foot ulcerations, increased appetite, nausea, polydipsia, polyuria, visual disturbances, vomiting, and weight loss.  Current diabetic medications include jardiance, januvia, metformin Compliant with meds - Yes  She did not tolerate ozempic.  Current monitoring regimen: most days Home blood sugar records: fasting 110-150s Any episodes of hypoglycemia? no  Current diet: "pretty good" Current exercise: none  Is She on ACE inhibitor or angiotensin II receptor blocker?  Yes, lisinopril Is She on statin? Yes atorvastatin  2. HTN Complaint with meds - Yes Current Medications - lisinopril Pertinent ROS:  Headache - No Fatigue - No Visual Disturbances - No Chest pain - No Dyspnea - No Palpitations - No LE edema - No  3. HLD On atorvastatin. Regular diet. No exercise. Denies side effects.   4. GERD Compliant with medications - No Current medications - omeprazole.  Sore throat - No Voice change - No Hemoptysis - No Dysphagia or dyspepsia - No Water brash - No Red Flags (weight loss, hematochezia, melena, weight loss, early satiety, fevers, odynophagia, or persistent vomiting) - No  5. COPD/asthma Reports stable. Symptoms have been better with cooler weather. No cough. No shortness of breath or wheezing. Compliant with breztri. Not having to use albuterol.   6. Depression/anxiety Managed by [facility]. Reports stable. Denies SI.   7. Left arm pain She has been been pain in her left bicep for abut 2 months. Pain is sharp. Sometimes has pain in left shoulder, radiates to left elbow. No numbness, tingling. Constant pain. Pain is worse with lifting items with her  arm.  Difficulty reaching behind her back. Denies alleviating factors. No injury. She has tried tylenol, advil, ice, heat without relief.   8. Tinnitis Chronic for at least a year. Getting worse. Sometimes has a hard time hearing. No pain, no drainage. Hears humming or ringing constantly. Worse at night. Saw audiology years ago when this started and reports that she did not have hearing loss.      01/29/2023    9:24 AM 11/27/2022    9:53 AM 10/29/2022    9:57 AM  Depression screen PHQ 2/9  Decreased Interest  2   Down, Depressed, Hopeless  1   PHQ - 2 Score  3   Altered sleeping  2   Tired, decreased energy  1   Change in appetite  0   Feeling bad or failure about yourself   2   Trouble concentrating  1   Moving slowly or fidgety/restless  0   Suicidal thoughts  0   PHQ-9 Score  9   Difficult doing work/chores  Somewhat difficult      Information is confidential and restricted. Go to Review Flowsheets to unlock data.      01/29/2023    9:25 AM 11/27/2022    9:52 AM 10/29/2022    9:58 AM 08/26/2022    9:24 AM  GAD 7 : Generalized Anxiety Score  Nervous, Anxious, on Edge  1  2  Control/stop worrying  2  2  Worry too much - different things  2  1  Trouble relaxing  3  2  Restless  1  1  Easily  annoyed or irritable  1  1  Afraid - awful might happen  1  1  Total GAD 7 Score  11  10  Anxiety Difficulty  Somewhat difficult  Somewhat difficult     Information is confidential and restricted. Go to Review Flowsheets to unlock data.       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    Objective:     BP 116/74   Pulse 89   Temp 98.4 F (36.9 C) (Temporal)   Ht 4\' 11"  (1.499 m)   Wt 134 lb 6 oz (61 kg)   SpO2 97%   BMI 27.14 kg/m  BP Readings from Last 3 Encounters:  03/06/23 116/74  11/27/22 111/67  11/27/22 111/71   No data found.   Physical Exam Vitals and nursing note reviewed.  Constitutional:      General: She is not in acute distress.    Appearance: Normal appearance. She is not ill-appearing, toxic-appearing or diaphoretic.  HENT:     Right Ear: External ear normal. There is impacted cerumen.     Left Ear: Tympanic membrane, ear canal and external ear normal. There is no impacted cerumen.     Nose: Nose normal.     Mouth/Throat:     Mouth: Mucous membranes are moist.     Pharynx: Oropharynx is clear. No oropharyngeal exudate or posterior oropharyngeal erythema.  Neck:     Vascular: No carotid bruit.  Cardiovascular:     Rate and Rhythm: Normal rate and regular rhythm.     Pulses: Normal pulses.     Heart sounds: Normal heart sounds. No murmur heard. Pulmonary:     Effort: Pulmonary effort is normal. No respiratory distress.     Breath sounds: Normal breath sounds.  Abdominal:     General: Bowel sounds are normal. There is no distension.     Palpations: Abdomen is soft. There is no mass.     Tenderness: There is no abdominal tenderness. There is no guarding or rebound.  Musculoskeletal:     Left shoulder: No swelling, tenderness or bony tenderness.     Left upper arm: Tenderness present. No swelling, edema or bony tenderness.     Cervical back: Neck  supple. No edema or rigidity. No spinous process tenderness or muscular tenderness.     Right lower leg: No edema.     Left lower leg: No edema.     Comments: + empty can test on left  Skin:  General: Skin is warm and dry.  Neurological:     General: No focal deficit present.     Mental Status: She is alert and oriented to person, place, and time.  Psychiatric:        Mood and Affect: Mood normal.        Behavior: Behavior normal.        Thought Content: Thought content normal.        Judgment: Judgment normal.    Ear Cerumen Removal  Date/Time: 03/06/2023 9:08 AM  Performed by: Hessie Diener, LPN Authorized by: Gabriel Earing, FNP   Anesthesia: Local Anesthetic: none Location details: right ear Comments: Discontinued due to patient's discomfort Procedure type: irrigation  Sedation: Patient sedated: no       No results found for any visits on 03/06/23.    The 10-year ASCVD risk score (Arnett DK, et al., 2019) is: 7.3%    Assessment & Plan:   Giovanna was seen today for medical management of chronic issues and diabetes.  Diagnoses and all orders for this visit:  Type 2 diabetes mellitus with hyperglycemia, without long-term current use of insulin (HCC) A1c 7.7 today, not at goal of <7. Medication changes today: D/c januvia. Start mounjaro as she had significant side effects. Continue metformin, jardiance. She is on an ACE/ARB and statin. Eye exam: UTD. Foot exam: today. Urine micro: UTD. Diet and exercise.  -     Microalbumin / creatinine urine ratio -     Bayer DCA Hb A1c Waived -     tirzepatide (MOUNJARO) 2.5 MG/0.5ML Pen; Inject 2.5 mg into the skin once a week. -     tirzepatide [facility]) 5 MG/0.5ML Pen; Inject 5 mg into the skin once a week.  Long term current use of oral hypoglycemic drug  Hypertension associated with diabetes (HCC) Well controlled on current regimen.   Hyperlipidemia associated with type 2 diabetes mellitus (HCC) On statin.  Last LDL 77.  Gastroesophageal reflux disease without esophagitis Well controlled on current regimen.   COPD with asthma (HCC) Well controlled on current regimen.   Moderate single current episode of major depressive disorder (HCC) Stable. Denies SI. Managed by [facility].   Left arm pain ? Rotator cuff pathology. Xrays obtained today. Will notify patient of results and plan of care when available.  -     DG Shoulder Left; Future -     DG Humerus Left; Future  Tinnitus of both ears  Decreased hearing of both ears Impacted cerumen of right ear Chronic tinnitus and decreased hearing. Attempted to irrigate right ear, however discontinued due to patient's discomfort. Discussed debrox OTC for 2 weeks and will have her return at that point to try irrigation again. Discussed referral to audiology after irrigation.  -     Ear wax removal   Encounter for immunization -     Flu vaccine trivalent PF, 6mos and older(Flulaval,Afluria,Fluarix,Fluzone)   Return in about 3 months (around 06/04/2023) for chronic follow up.   The patient indicates understanding of these issues and agrees with the plan.  Gabriel Earing, FNP

## 2023-03-07 LAB — MICROALBUMIN / CREATININE URINE RATIO
Creatinine, Urine: 61.4 mg/dL
Microalb/Creat Ratio: 7 mg/g{creat} (ref 0–29)
Microalbumin, Urine: 4.3 ug/mL

## 2023-03-10 ENCOUNTER — Other Ambulatory Visit: Payer: Self-pay | Admitting: Family Medicine

## 2023-03-10 DIAGNOSIS — M79602 Pain in left arm: Secondary | ICD-10-CM

## 2023-03-12 ENCOUNTER — Ambulatory Visit (INDEPENDENT_AMBULATORY_CARE_PROVIDER_SITE_OTHER): Payer: Medicaid Other | Admitting: Orthopaedic Surgery

## 2023-03-12 ENCOUNTER — Encounter: Payer: Self-pay | Admitting: Orthopaedic Surgery

## 2023-03-12 VITALS — BP 139/94 | HR 91 | Ht 59.0 in | Wt 135.0 lb

## 2023-03-12 DIAGNOSIS — M25512 Pain in left shoulder: Secondary | ICD-10-CM | POA: Diagnosis not present

## 2023-03-12 DIAGNOSIS — G8929 Other chronic pain: Secondary | ICD-10-CM | POA: Diagnosis not present

## 2023-03-12 MED ORDER — METHYLPREDNISOLONE ACETATE 40 MG/ML IJ SUSP
40.0000 mg | Freq: Once | INTRAMUSCULAR | Status: AC
  Administered 2023-03-12: 40 mg via INTRA_ARTICULAR

## 2023-03-12 NOTE — Progress Notes (Signed)
Subjective:    Patient ID: Charlotte Perez, female    DOB: 09/21/1960, 62 y.o.   MRN: [id number]  HPI Her left shoulder and upper arm have been hurting several months.  It has gotten progressively worse.  She has no trauma, no redness, no numbness, no swelling.  She has pain moving her hand over her head and laying on it at night.  She saw [place] and had X-rays done 03-09-23.  She has taken Tylenol and used heat and ice without help.  I have independently reviewed and interpreted x-rays of this patient done at another site by another physician or qualified health professional..  I have reviewed the notes from [facility].   Review of Systems  Constitutional:  Positive for activity change.  Respiratory:  Positive for shortness of breath.   Musculoskeletal:  Positive for arthralgias, back pain and myalgias.  All other systems reviewed and are negative. For Review of Systems, all other systems reviewed and are negative.  The following is a summary of the past history medically, past history surgically, known current medicines, social history and family history.  This information is gathered electronically by the computer from prior information and documentation.  I review this each visit and have found including this information at this point in the chart is beneficial and informative.   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   DDD (degenerative disc disease), cervical    DDD (degenerative disc disease), lumbar    Depression    Diabetes mellitus (HCC) 06/27/2010   Diabetes mellitus, type II (HCC)    Gallbladder sludge    GERD (gastroesophageal reflux disease)    Hyperlipidemia     Hypertension    Iron deficiency anemia 09/05/2016   Lumbar radiculopathy    Mole (skin)    Neuropathy of foot    Renal cyst    Shingles    Skin lesions, generalized    1.2 CM FLAT FAWN COLOR AT THE T10 AREA JUST RIGHT OF SPINE     Past Surgical History:  Procedure Laterality Date   ABDOMINAL HYSTERECTOMY     total hysterectomy   BACK SURGERY     BIOPSY  03/07/2021   Procedure: BIOPSY;  Surgeon: Dolores Frame, MD;  Location: [facility];  Service: Gastroenterology;;   CESAREAN SECTION     x 2   ESOPHAGOGASTRODUODENOSCOPY (EGD) WITH PROPOFOL N/A 03/07/2021   Procedure: ESOPHAGOGASTRODUODENOSCOPY (EGD) WITH PROPOFOL;  Surgeon: Dolores Frame, MD;  Location: [facility];  Service: Gastroenterology;  Laterality: N/A;  9:15   NECK SURGERY      Current Outpatient Medications on File Prior to Visit  Medication Sig Dispense Refill   ACCU-CHEK FASTCLIX LANCETS MISC EVERY DAY 102 each 2   albuterol (PROVENTIL) (2.5 MG/3ML) 0.083% nebulizer solution Take 3 mLs (2.5 mg total) by nebulization every 6 (six) hours as needed for wheezing or shortness of breath. 75 mL 3   albuterol (VENTOLIN HFA) 108 (90 Base) MCG/ACT inhaler Inhale 2 puffs into the lungs every 6 (six) hours as needed for wheezing or shortness of breath. 8 g 0   ALPRAZolam (XANAX) 1 MG tablet Take 1 tablet (1  mg total) by mouth at bedtime as needed for anxiety. 30 tablet 2   atorvastatin (LIPITOR) 40 MG tablet TAKE ONE (1) TABLET BY MOUTH EVERY DAY 90 tablet 0   Boric Acid Vaginal ([employer] BORIC ACID) 600 MG SUPP Place 1 suppository vaginally at bedtime. 7 suppository 3   Budeson-Glycopyrrol-Formoterol (BREZTRI AEROSPHERE) 160-9-4.8 MCG/ACT AERO Inhale 2 puffs into the lungs 2 (two) times daily. 10.7 g 11   clobetasol ointment (TEMOVATE) 0.05 % Pea-sized amount to affected area as needed- at most nightly 30 g 1   cycloSPORINE (RESTASIS) 0.05 % ophthalmic emulsion Place 1 drop into both eyes at bedtime.      dicyclomine (BENTYL) 10 MG capsule Take 1 capsule (10 mg total) by mouth 2 times daily at 12 noon and 4 pm. 60 capsule 2   empagliflozin (JARDIANCE) 25 MG TABS tablet Take 1 tablet (25 mg total) by mouth daily. 90 tablet 3   ferrous sulfate 324 MG TBEC Take 324 mg by mouth.     fluconazole (DIFLUCAN) 150 MG tablet Take one tablet every other day for one week, then weekly for 2 mos 12 tablet 2   glucose blood (ACCU-CHEK GUIDE) test strip Use daily Dx E11.9 100 each 3   HYDROcodone-acetaminophen (NORCO) 10-325 MG tablet Take 1 tablet by mouth 3 (three) times daily as needed for pain.     latanoprost (XALATAN) 0.005 % ophthalmic solution Place 1 drop into both eyes at bedtime.     LINZESS 145 MCG CAPS capsule TAKE 1 CAPSULE BY MOUTH EVERY MORNING BEFORE BREAKFAST 90 capsule 3   lisinopril (ZESTRIL) 5 MG tablet TAKE 1/2 TABLET BY MOUTH ONCE DAILY 90 tablet 0   meclizine (ANTIVERT) 25 MG tablet Take 50 mg by mouth 2 (two) times daily.     metFORMIN (GLUCOPHAGE) 1000 MG tablet TAKE ONE TABLET BY MOUTH TWICE DAILY WITH MEALS 180 tablet 0   omeprazole (PRILOSEC) 40 MG capsule Take 1 capsule (40 mg total) by mouth daily. TAKE ONE (1) CAPSULE EACH DAY Strength: 40 mg 90 capsule 3   ondansetron (ZOFRAN-ODT) 8 MG disintegrating tablet TAKE 1 TABLET BY MOUTH EVERY 6 HOURS AS NEEDED FOR NAUSEA & VOMITING 20 tablet 1   pregabalin (LYRICA) 150 MG capsule Take 1 capsule (150 mg total) by mouth 2 (two) times daily. 60 capsule 2   tirzepatide (MOUNJARO) 2.5 MG/0.5ML Pen Inject 2.5 mg into the skin once a week. 2 mL 0   tirzepatide (MOUNJARO) 5 MG/0.5ML Pen Inject 5 mg into the skin once a week. 6 mL 0   venlafaxine XR (EFFEXOR-XR) 150 MG 24 hr capsule Take 2 capsules (300 mg total) by mouth at bedtime. 60 capsule 2   No current facility-administered medications on file prior to visit.    Social History   Socioeconomic History   Marital status: Legally Separated    Spouse name: Ronnie   Number of children: 3    Years of education: 9   Highest education level: 9th grade  Occupational History   Occupation: disablility  Tobacco Use   Smoking status: Never    Passive exposure: Yes   Smokeless tobacco: Never  Vaping Use   Vaping status: Never Used  Substance and Sexual Activity   Alcohol use: No   Drug use: No   Sexual activity: Not Currently    Birth control/protection: Surgical    Comment: hyst  Other Topics Concern   Not on file  Social History Narrative   Not on file   Social  Drivers of Health   Financial Resource Strain: Medium Risk (07/10/2022)   Overall Financial Resource Strain (CARDIA)    Difficulty of Paying Living Expenses: Somewhat hard  Food Insecurity: Food Insecurity Present (07/10/2022)   Hunger Vital Sign    Worried About Running Out of Food in the Last Year: Often true    Ran Out of Food in the Last Year: Sometimes true  Transportation Needs: Unmet Transportation Needs (07/10/2022)   PRAPARE - Administrator, Civil Service (Medical): No    Lack of Transportation (Non-Medical): Yes  Physical Activity: Insufficiently Active (07/10/2022)   Exercise Vital Sign    Days of Exercise per Week: 2 days    Minutes of Exercise per Session: 20 min  Stress: Stress Concern Present (07/10/2022)   [employer] of Occupational Health - Occupational Stress Questionnaire    Feeling of Stress : To some extent  Social Connections: Socially Isolated (07/10/2022)   Social Connection and Isolation Panel [NHANES]    Frequency of Communication with Friends and Family: Once a week    Frequency of Social Gatherings with Friends and Family: Never    Attends Religious Services: Never    Database administrator or Organizations: No    Attends Banker Meetings: Never    Marital Status: Married  Catering manager Violence: Not At Risk (07/10/2022)   Humiliation, Afraid, Rape, and Kick questionnaire    Fear of Current or Ex-Partner: No    Emotionally Abused: No     Physically Abused: No    Sexually Abused: No    Family History  Problem Relation Age of Onset   ADD / ADHD Other    COPD Father    Alcohol abuse Father    Depression Daughter    Alcohol abuse Mother    COPD Mother        on oxygen   Colon cancer Paternal Grandfather    Heart disease Sister    Heart disease Brother    Arthritis Sister        knee replacement   Diabetes Maternal Grandmother    Diabetes Sister     BP (!) 139/94   Pulse 91   Ht 4\' 11"  (1.499 m)   Wt 135 lb (61.2 kg)   BMI 27.27 kg/m   Body mass index is 27.27 kg/m.      Objective:   Physical Exam Vitals and nursing note reviewed. Exam conducted with a chaperone present.  Constitutional:      Appearance: She is well-developed.  HENT:     Head: Normocephalic and atraumatic.  Eyes:     Conjunctiva/sclera: Conjunctivae normal.     Pupils: Pupils are equal, round, and reactive to light.  Cardiovascular:     Rate and Rhythm: Normal rate and regular rhythm.  Pulmonary:     Effort: Pulmonary effort is normal.  Abdominal:     Palpations: Abdomen is soft.  Musculoskeletal:       Arms:     Cervical back: Normal range of motion and neck supple.  Skin:    General: Skin is warm and dry.  Neurological:     Mental Status: She is alert and oriented to person, place, and time.     Cranial Nerves: No cranial nerve deficit.     Motor: No abnormal muscle tone.     Coordination: Coordination normal.     Deep Tendon Reflexes: Reflexes are normal and symmetric. Reflexes normal.  Psychiatric:  Behavior: Behavior normal.        Thought Content: Thought content normal.        Judgment: Judgment normal.           Assessment & Plan:   Encounter Diagnosis  Name Primary?   Chronic pain in left shoulder Yes   PROCEDURE NOTE:  The patient request injection, verbal consent was obtained.  The left shoulder was prepped appropriately after time out was performed.   Sterile technique was observed and  injection of 1 cc of DepoMedrol 40mg  with several cc's of plain xylocaine. Anesthesia was provided by ethyl chloride and a 20-gauge needle was used to inject the shoulder area. A posterior approach was used.  The injection was tolerated well.  A band aid dressing was applied.  The patient was advised to apply ice later today and tomorrow to the injection sight as needed.  Return in three weeks.  Try Advil two to three tablets twice a day.  Stop if it upsets stomach.  She may need MRI.  Call if any problem.  Precautions discussed.  Electronically Signed Darreld Mclean, MD 12/18/20248:23 AM

## 2023-03-17 DIAGNOSIS — E559 Vitamin D deficiency, unspecified: Secondary | ICD-10-CM | POA: Diagnosis not present

## 2023-03-17 DIAGNOSIS — R030 Elevated blood-pressure reading, without diagnosis of hypertension: Secondary | ICD-10-CM | POA: Diagnosis not present

## 2023-03-17 DIAGNOSIS — M545 Low back pain, unspecified: Secondary | ICD-10-CM | POA: Diagnosis not present

## 2023-03-17 DIAGNOSIS — Z6827 Body mass index (BMI) 27.0-27.9, adult: Secondary | ICD-10-CM | POA: Diagnosis not present

## 2023-03-17 DIAGNOSIS — Z79899 Other long term (current) drug therapy: Secondary | ICD-10-CM | POA: Diagnosis not present

## 2023-03-17 DIAGNOSIS — E119 Type 2 diabetes mellitus without complications: Secondary | ICD-10-CM | POA: Diagnosis not present

## 2023-03-21 DIAGNOSIS — Z79899 Other long term (current) drug therapy: Secondary | ICD-10-CM | POA: Diagnosis not present

## 2023-03-24 ENCOUNTER — Encounter: Payer: Self-pay | Admitting: Family Medicine

## 2023-03-24 ENCOUNTER — Ambulatory Visit: Payer: Medicaid Other | Admitting: Family Medicine

## 2023-03-24 VITALS — BP 119/81 | HR 91 | Temp 98.8°F | Ht 59.0 in | Wt 133.4 lb

## 2023-03-24 DIAGNOSIS — H9193 Unspecified hearing loss, bilateral: Secondary | ICD-10-CM

## 2023-03-24 DIAGNOSIS — H9313 Tinnitus, bilateral: Secondary | ICD-10-CM | POA: Diagnosis not present

## 2023-03-24 DIAGNOSIS — H6121 Impacted cerumen, right ear: Secondary | ICD-10-CM

## 2023-03-24 NOTE — Progress Notes (Signed)
   Acute Office Visit  Subjective:     Patient ID: Charlotte Perez, female    DOB: [birth year], 62 y.o.   MRN: [id number]  Chief Complaint  Patient presents with   Cerumen Impaction    HPI Patient is in today for follow up of cerumen impaction of right ear. She has been using debrox as directed. She continues to have tinnitus and decreased hearing in both ears. No pain, fever, or drainage.   ROS As per HPI.      Objective:    BP 119/81   Pulse 91   Temp 98.8 F (37.1 C) (Temporal)   Ht 4\' 11"  (1.499 m)   Wt 133 lb 6.4 oz (60.5 kg)   SpO2 96%   BMI 26.94 kg/m    Physical Exam Vitals and nursing note reviewed.  Constitutional:      General: She is not in acute distress.    Appearance: Normal appearance. She is not ill-appearing, toxic-appearing or diaphoretic.  HENT:     Right Ear: Tympanic membrane, ear canal and external ear normal.     Left Ear: Tympanic membrane, ear canal and external ear normal.  Cardiovascular:     Rate and Rhythm: Normal rate and regular rhythm.     Heart sounds: Normal heart sounds. No murmur heard. Skin:    General: Skin is warm and dry.  Neurological:     General: No focal deficit present.     Mental Status: She is alert and oriented to person, place, and time.  Psychiatric:        Mood and Affect: Mood normal.        Behavior: Behavior normal.     No results found for any visits on 03/24/23.      Assessment & Plan:   Charlotte Perez was seen today for cerumen impaction.  Diagnoses and all orders for this visit:  Impacted cerumen of right ear -     Ambulatory referral to Audiology  Tinnitus of both ears -     Ambulatory referral to Audiology  Decreased hearing of both ears -     Ambulatory referral to Audiology  No cerumen on exam today. Referral to audiology discussed and placed.   Keep schedule follow up. Sooner for new or worsening symptoms.   The patient indicates understanding of these issues and agrees with the  plan.  Gabriel Earing, FNP

## 2023-03-31 ENCOUNTER — Encounter: Payer: Self-pay | Admitting: *Deleted

## 2023-04-02 ENCOUNTER — Ambulatory Visit: Payer: Medicaid Other | Admitting: Orthopaedic Surgery

## 2023-04-02 ENCOUNTER — Encounter: Payer: Self-pay | Admitting: Orthopaedic Surgery

## 2023-04-02 VITALS — BP 117/81 | HR 119 | Ht 59.0 in | Wt 133.0 lb

## 2023-04-02 DIAGNOSIS — G8929 Other chronic pain: Secondary | ICD-10-CM

## 2023-04-02 DIAGNOSIS — M25512 Pain in left shoulder: Secondary | ICD-10-CM | POA: Diagnosis not present

## 2023-04-02 MED ORDER — METHYLPREDNISOLONE ACETATE 40 MG/ML IJ SUSP
40.0000 mg | Freq: Once | INTRAMUSCULAR | Status: AC
  Administered 2023-04-02: 40 mg via INTRA_ARTICULAR

## 2023-04-02 NOTE — Progress Notes (Signed)
 PROCEDURE NOTE:  The patient request injection, verbal consent was obtained.  The left shoulder was prepped appropriately after time out was performed.   Sterile technique was observed and injection of 1 cc of DepoMedrol 40mg  with several cc's of plain xylocaine . Anesthesia was provided by ethyl chloride and a 20-gauge needle was used to inject the shoulder area. A posterior approach was used.  The injection was tolerated well.  A band aid dressing was applied.  The patient was advised to apply ice later today and tomorrow to the injection sight as needed.  Encounter Diagnosis  Name Primary?   Chronic pain in left shoulder Yes   She is better after the last injection.  Return in one month.  Consider MRI if not improved.  Call if any problem.  Precautions discussed.  Electronically Signed Lemond Stable, MD 1/8/20252:23 PM

## 2023-04-02 NOTE — Addendum Note (Signed)
 Addended by: Baird Kay on: 04/02/2023 02:37 PM   Modules accepted: Orders

## 2023-04-14 ENCOUNTER — Encounter: Payer: Self-pay | Admitting: *Deleted

## 2023-04-14 ENCOUNTER — Telehealth: Payer: Self-pay | Admitting: Family Medicine

## 2023-04-14 NOTE — Telephone Encounter (Unsigned)
Copied from CRM [id number]. Topic: Clinical - Prescription Issue >> Apr 14, 2023  4:20 PM Fredrica W wrote: Reason for CRM: Patient called states she was prescribed a new medication injection for diabetes and is not able to fill due to requires prior authorization. Patient wanted to know I prior authorization had been submitted or is in process. Thank You

## 2023-04-15 ENCOUNTER — Other Ambulatory Visit (HOSPITAL_COMMUNITY): Payer: Self-pay

## 2023-04-15 ENCOUNTER — Telehealth: Payer: Self-pay

## 2023-04-15 DIAGNOSIS — E1165 Type 2 diabetes mellitus with hyperglycemia: Secondary | ICD-10-CM

## 2023-04-15 NOTE — Telephone Encounter (Signed)
Pharmacy Patient Advocate Encounter  Received notification from [facility] MEDICAID that Prior Authorization for [facility] 2.5MG /0.5ML auto-injectors  has been DENIED.  See denial reason below. No denial letter attached in CMM. Will attach denial letter to Media tab once received.   PA #/Case ID/Reference #: : [id number]   DENIAL REASON: Patient has not tried 2 preferred drug. Preferred drugs: Ozempic, Byetta, Trulicity, Victoza. Only Ozempic has been tried.

## 2023-04-15 NOTE — Telephone Encounter (Signed)
PA request has been Submitted. New Encounter created for follow up. For additional info see Pharmacy Prior Auth telephone encounter from 04/15/23.

## 2023-04-15 NOTE — Telephone Encounter (Signed)
Pharmacy Patient Advocate Encounter   Received notification from Pt Calls Messages that prior authorization for Mounjaro 2.5MG /0.5ML auto-injectors is required/requested.   Insurance verification completed.   The patient is insured through [facility] MEDICAID.   Per test claim: PA required; PA submitted to above mentioned insurance via CoverMyMeds Key/confirmation #/EOC [id number] Status is pending

## 2023-04-16 DIAGNOSIS — E119 Type 2 diabetes mellitus without complications: Secondary | ICD-10-CM | POA: Diagnosis not present

## 2023-04-16 DIAGNOSIS — M545 Low back pain, unspecified: Secondary | ICD-10-CM | POA: Diagnosis not present

## 2023-04-16 DIAGNOSIS — E559 Vitamin D deficiency, unspecified: Secondary | ICD-10-CM | POA: Diagnosis not present

## 2023-04-16 DIAGNOSIS — Z79899 Other long term (current) drug therapy: Secondary | ICD-10-CM | POA: Diagnosis not present

## 2023-04-16 DIAGNOSIS — E663 Overweight: Secondary | ICD-10-CM | POA: Diagnosis not present

## 2023-04-17 ENCOUNTER — Other Ambulatory Visit: Payer: Self-pay | Admitting: Family Medicine

## 2023-04-17 DIAGNOSIS — E1165 Type 2 diabetes mellitus with hyperglycemia: Secondary | ICD-10-CM

## 2023-04-17 MED ORDER — TRULICITY 0.75 MG/0.5ML ~~LOC~~ SOAJ: 0.7500 mg | SUBCUTANEOUS | 6 refills | Status: DC

## 2023-04-17 NOTE — Telephone Encounter (Signed)
Copied from CRM [id number]. Topic: Clinical - Prescription Issue >> Apr 17, 2023  4:06 PM Clayton Bibles wrote: Reason for CRM: Gracelee called and her Pharmacy said that it needs prior authorization from [employer]. Please start prior authorization.

## 2023-04-17 NOTE — Telephone Encounter (Signed)
Per chart review, medication sent to pharmacy today.

## 2023-04-17 NOTE — Telephone Encounter (Signed)
Patient notified

## 2023-04-17 NOTE — Telephone Encounter (Signed)
Insurance won't cover mounjaro until she fails 2 GLPs. I have sent in trulicity for her to try instead.

## 2023-04-17 NOTE — Addendum Note (Signed)
Addended by: Gabriel Earing on: 04/17/2023 03:46 PM   Modules accepted: Orders

## 2023-04-18 DIAGNOSIS — Z79899 Other long term (current) drug therapy: Secondary | ICD-10-CM | POA: Diagnosis not present

## 2023-04-20 DIAGNOSIS — M545 Low back pain, unspecified: Secondary | ICD-10-CM | POA: Diagnosis not present

## 2023-04-21 ENCOUNTER — Telehealth: Payer: Self-pay

## 2023-04-21 ENCOUNTER — Other Ambulatory Visit (HOSPITAL_COMMUNITY): Payer: Self-pay

## 2023-04-21 NOTE — Telephone Encounter (Signed)
Pharmacy Patient Advocate Encounter   Received notification from  [facility] Portal that prior authorization for Trulicity 0.75MG /0.5ML auto-injectors is required/requested.   Insurance verification completed.   The patient is insured through [facility] MEDICAID .   Per test claim: PA required; PA submitted to above mentioned insurance via CoverMyMeds Key/confirmation #/EOC [id number] Status is pending

## 2023-04-22 ENCOUNTER — Other Ambulatory Visit (HOSPITAL_COMMUNITY): Payer: Self-pay

## 2023-04-22 NOTE — Telephone Encounter (Signed)
Pharmacy Patient Advocate Encounter  Received notification from [facility] MEDICAID that Prior Authorization for Trulicity 0.75MG /0.5ML auto-injectors has been APPROVED from 001/27/25 to 04/20/24. Ran test claim, Copay is $4. This test claim was processed through [facility]- copay amounts may vary at other pharmacies due to pharmacy/plan contracts, or as the patient moves through the different stages of their insurance plan.   PA #/Case ID/Reference #: [id number]

## 2023-04-24 ENCOUNTER — Ambulatory Visit: Payer: Medicaid Other | Admitting: Family Medicine

## 2023-04-28 ENCOUNTER — Ambulatory Visit (INDEPENDENT_AMBULATORY_CARE_PROVIDER_SITE_OTHER): Payer: Medicaid Other | Admitting: Psychiatry

## 2023-04-28 ENCOUNTER — Encounter (HOSPITAL_COMMUNITY): Payer: Self-pay | Admitting: Psychiatry

## 2023-04-28 VITALS — BP 121/77 | HR 121 | Ht 59.0 in | Wt 132.2 lb

## 2023-04-28 DIAGNOSIS — F321 Major depressive disorder, single episode, moderate: Secondary | ICD-10-CM

## 2023-04-28 DIAGNOSIS — F411 Generalized anxiety disorder: Secondary | ICD-10-CM | POA: Diagnosis not present

## 2023-04-28 MED ORDER — ALPRAZOLAM 1 MG PO TABS: 1.0000 mg | ORAL_TABLET | Freq: Every evening | ORAL | 2 refills | Status: DC | PRN

## 2023-04-28 MED ORDER — VENLAFAXINE HCL ER 150 MG PO CP24: 300.0000 mg | ORAL_CAPSULE | Freq: Every day | ORAL | 2 refills | Status: DC

## 2023-04-28 NOTE — Progress Notes (Signed)
BH MD/PA/NP OP Progress Note  04/28/2023 9:44 AM Charlotte Perez  MRN:  [id number]  Chief Complaint:  Chief Complaint  Patient presents with   Depression   Anxiety   Follow-up   HPI: This patient is a 63 year old married white female who lives with her husband and her brother-in-law and her brother-in-law's daughter in [place].  She is on disability.  The patient returns for follow-up after 3 months regarding her depression and anxiety.  She states most the time she is doing well.  She does feel tired even though she sleeps about 9 to 10 hours per night.  Her labs look okay except for her A1c being 7.7.  She is being put on Trulicity.  I asked her to discuss the fatigue with her PCP.  She denies significant depression.  She has the usual stressors are due to of dealing with her husband who has mild dementia and irritability.   she seems to be managing okay.  She denies severe depression thoughts of health self-harm or suicide and still feels that her medications are helpful. Visit Diagnosis:    ICD-10-CM   1. Moderate single current episode of major depressive disorder (HCC)  F32.1     2. 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   DDD (degenerative disc disease), cervical    DDD (degenerative disc disease), lumbar    Depression    Diabetes mellitus (HCC) 06/27/2010   Diabetes mellitus, type II (HCC)    Gallbladder sludge    GERD (gastroesophageal reflux disease)    Hyperlipidemia    Hypertension    Iron deficiency anemia 09/05/2016   Lumbar radiculopathy    Mole (skin)     Neuropathy of foot    Renal cyst    Shingles    Skin lesions, generalized    1.2 CM FLAT FAWN COLOR AT THE T10 AREA JUST RIGHT OF SPINE     Past Surgical History:  Procedure Laterality Date   ABDOMINAL HYSTERECTOMY     total hysterectomy   BACK SURGERY     BIOPSY  03/07/2021   Procedure: BIOPSY;  Surgeon: Dolores Frame, MD;  [facility];  Service: Gastroenterology;;   CESAREAN SECTION     x 2   ESOPHAGOGASTRODUODENOSCOPY (EGD) WITH PROPOFOL N/A 03/07/2021   Procedure: ESOPHAGOGASTRODUODENOSCOPY (EGD) WITH PROPOFOL;  Surgeon: Dolores Frame, MD;  Location: [facility];  Service: Gastroenterology;  Laterality: N/A;  9:15   NECK SURGERY      Family Psychiatric History: See below  Family History:  Family History  Problem Relation Age of Onset   ADD / ADHD Other    COPD Father    Alcohol abuse Father    Depression Daughter    Alcohol abuse Mother    COPD Mother        on oxygen   Colon cancer Paternal Grandfather    Heart disease Sister    Heart disease Brother    Arthritis Sister        knee replacement   Diabetes Maternal  Grandmother    Diabetes Sister     Social History:  Social History   Socioeconomic History   Marital status: Legally Separated    Spouse name: Ronnie   Number of children: 3   Years of education: 9   Highest education level: 9th grade  Occupational History   Occupation: disablility  Tobacco Use   Smoking status: Never    Passive exposure: Yes   Smokeless tobacco: Never  Vaping Use   Vaping status: Never Used  Substance and Sexual Activity   Alcohol use: No   Drug use: No   Sexual activity: Not Currently    Birth control/protection: Surgical    Comment: hyst  Other Topics Concern   Not on file  Social History Narrative   Not on file   Social Drivers of Health   Financial Resource Strain: Medium Risk (07/10/2022)   Overall Financial Resource Strain (CARDIA)    Difficulty of Paying Living  Expenses: Somewhat hard  Food Insecurity: Food Insecurity Present (07/10/2022)   Hunger Vital Sign    Worried About Running Out of Food in the Last Year: Often true    Ran Out of Food in the Last Year: Sometimes true  Transportation Needs: Unmet Transportation Needs (07/10/2022)   PRAPARE - Transportation    Lack of Transportation (Medical): No    Lack of Transportation (Non-Medical): Yes  Physical Activity: Insufficiently Active (07/10/2022)   Exercise Vital Sign    Days of Exercise per Week: 2 days    Minutes of Exercise per Session: 20 min  Stress: Stress Concern Present (07/10/2022)   [employer] of Occupational Health - Occupational Stress Questionnaire    Feeling of Stress : To some extent  Social Connections: Socially Isolated (07/10/2022)   Social Connection and Isolation Panel [NHANES]    Frequency of Communication with Friends and Family: Once a week    Frequency of Social Gatherings with Friends and Family: Never    Attends Religious Services: Never    Database administrator or Organizations: No    Attends Banker Meetings: Never    Marital Status: Married    Allergies:  Allergies  Allergen Reactions   Cymbalta [Duloxetine Hcl]     Per pt it started making her mouth tingle and could not taste anything   Gabapentin     Itching, fidgety   Meloxicam Other (See Comments)    Tongue tingling, and lost sense of taste.   Tramadol Other (See Comments)    Tongue tingling, and lost sense of taste.    Metabolic Disorder Labs: Lab Results  Component Value Date   HGBA1C 7.7 (H) 03/06/2023   No results found for: "PROLACTIN" Lab Results  Component Value Date   CHOL 162 02/07/2022   TRIG 81 02/07/2022   HDL 70 02/07/2022   CHOLHDL 2.3 02/07/2022   LDLCALC 77 02/07/2022   LDLCALC 69 05/11/2020   Lab Results  Component Value Date   TSH 0.644 08/26/2022   TSH 1.44 11/16/2020    Therapeutic Level Labs: No results found for: "LITHIUM" No results found  for: "VALPROATE" No results found for: "CBMZ"  Current Medications: Current Outpatient Medications  Medication Sig Dispense Refill   ACCU-CHEK FASTCLIX LANCETS MISC EVERY DAY 102 each 2   albuterol (PROVENTIL) (2.5 MG/3ML) 0.083% nebulizer solution Take 3 mLs (2.5 mg total) by nebulization every 6 (six) hours as needed for wheezing or shortness of breath. 75 mL 3   albuterol (VENTOLIN HFA) 108 (90 Base) MCG/ACT  inhaler Inhale 2 puffs into the lungs every 6 (six) hours as needed for wheezing or shortness of breath. 8 g 0   atorvastatin (LIPITOR) 40 MG tablet TAKE ONE (1) TABLET BY MOUTH EVERY DAY 90 tablet 0   Boric Acid Vaginal ([employer] BORIC ACID) 600 MG SUPP Place 1 suppository vaginally at bedtime. 7 suppository 3   Budeson-Glycopyrrol-Formoterol (BREZTRI AEROSPHERE) 160-9-4.8 MCG/ACT AERO Inhale 2 puffs into the lungs 2 (two) times daily. 10.7 g 11   clobetasol ointment (TEMOVATE) 0.05 % Pea-sized amount to affected area as needed- at most nightly 30 g 1   cycloSPORINE (RESTASIS) 0.05 % ophthalmic emulsion Place 1 drop into both eyes at bedtime.     dicyclomine (BENTYL) 10 MG capsule Take 1 capsule (10 mg total) by mouth 2 times daily at 12 noon and 4 pm. 60 capsule 2   Dulaglutide (TRULICITY) 0.75 MG/0.5ML SOAJ Inject 0.75 mg into the skin once a week. 2 mL 6   empagliflozin (JARDIANCE) 25 MG TABS tablet Take 1 tablet (25 mg total) by mouth daily. 90 tablet 3   ferrous sulfate 324 MG TBEC Take 324 mg by mouth.     fluconazole (DIFLUCAN) 150 MG tablet Take one tablet every other day for one week, then weekly for 2 mos 12 tablet 2   glucose blood (ACCU-CHEK GUIDE) test strip Use daily Dx E11.9 100 each 3   HYDROcodone-acetaminophen (NORCO) 10-325 MG tablet Take 1 tablet by mouth 3 (three) times daily as needed for pain.     latanoprost (XALATAN) 0.005 % ophthalmic solution Place 1 drop into both eyes at bedtime.     LINZESS 145 MCG CAPS capsule TAKE 1 CAPSULE BY MOUTH EVERY MORNING BEFORE  BREAKFAST 90 capsule 3   lisinopril (ZESTRIL) 5 MG tablet TAKE 1/2 TABLET BY MOUTH ONCE DAILY 90 tablet 0   meclizine (ANTIVERT) 25 MG tablet Take 50 mg by mouth 2 (two) times daily.     metFORMIN (GLUCOPHAGE) 1000 MG tablet TAKE ONE TABLET BY MOUTH TWICE DAILY WITH MEALS 180 tablet 0   omeprazole (PRILOSEC) 40 MG capsule Take 1 capsule (40 mg total) by mouth daily. TAKE ONE (1) CAPSULE EACH DAY Strength: 40 mg 90 capsule 3   ondansetron (ZOFRAN-ODT) 8 MG disintegrating tablet TAKE 1 TABLET BY MOUTH EVERY 6 HOURS AS NEEDED FOR NAUSEA & VOMITING 20 tablet 1   pregabalin (LYRICA) 150 MG capsule Take 1 capsule (150 mg total) by mouth 2 (two) times daily. 60 capsule 2   ALPRAZolam (XANAX) 1 MG tablet Take 1 tablet (1 mg total) by mouth at bedtime as needed for anxiety. 30 tablet 2   venlafaxine XR (EFFEXOR-XR) 150 MG 24 hr capsule Take 2 capsules (300 mg total) by mouth at bedtime. 60 capsule 2   No current facility-administered medications for this visit.     Musculoskeletal: Strength & Muscle Tone: within normal limits Gait & Station: normal Patient leans: N/A  Psychiatric Specialty Exam: Review of Systems  Musculoskeletal:  Positive for arthralgias.  All other systems reviewed and are negative.   Blood pressure 121/77, pulse (!) 121, height 4\' 11"  (1.499 m), weight 132 lb 3.2 oz (60 kg), SpO2 97%.Body mass index is 26.7 kg/m.  General Appearance: Casual and Fairly Groomed  Eye Contact:  Good  Speech:  Clear and Coherent  Volume:  Normal  Mood:  Euthymic  Affect:  Congruent  Thought Process:  Goal Directed  Orientation:  Full (Time, Place, and Person)  Thought Content: WDL  Suicidal Thoughts:  No  Homicidal Thoughts:  No  Memory:  Immediate;   Good Recent;   Good Remote;   Good  Judgement:  Good  Insight:  Good  Psychomotor Activity:  Normal  Concentration:  Concentration: Good and Attention Span: Good  Recall:  Good  Fund of Knowledge: Good  Language: Good  Akathisia:   No  Handed:  Right  AIMS (if indicated): not done  Assets:  Communication Skills Desire for Improvement Resilience Social Support Talents/Skills  ADL's:  Intact  Cognition: WNL  Sleep:  Good   Screenings: GAD-7    Flowsheet Row Office Visit from 03/24/2023 in [facility] Office Visit from 01/29/2023 in [facility] [place] Office Visit from 11/27/2022 in [facility] Office Visit from 10/29/2022 in [facility] [place] Office Visit from 08/26/2022 in [facility]  Total GAD-7 Score 9 6 11 11 10       PHQ2-9    Flowsheet Row Office Visit from 03/24/2023 in [facility] Office Visit from 01/29/2023 in [facility] [place] Office Visit from 11/27/2022 in [facility] Office Visit from 10/29/2022 in [facility] [place] Office Visit from 08/26/2022 in [facility]  PHQ-2 Total Score 5 5 3 4 3   PHQ-9 Total Score 13 13 9 18 10       Flowsheet Row Office Visit from 08/06/2022 in [facility] [place] Video Visit from 02/06/2022 in [facility] [place] Counselor from 12/05/2021 in [facility]   C-SSRS RISK CATEGORY No Risk No Risk Error: Q3, 4, or 5 should not be populated when Q2 is No        Assessment and Plan: This patient is a 63 year old female with a history of depression and anxiety.  She seems to be doing well on her current regimen.  She we will continue Effexor XR 300 mg daily for depression and Xanax 1 mg at bedtime as needed for anxiety or sleep.  She will return to see me in 3 months  Collaboration of Care: Collaboration of Care: Primary Care  Provider AEB notes are shared with PCP on the epic system  Patient/Guardian was advised Release of Information must be obtained prior to any record release in order to collaborate their care with an outside provider. Patient/Guardian was advised if they have not already done so to contact the registration department to sign all necessary forms in order for [place] to release information regarding their care.   Consent: Patient/Guardian gives verbal consent for treatment and assignment of benefits for services provided during this visit. Patient/Guardian expressed understanding and agreed to proceed.    Diannia Ruder, MD 04/28/2023, 9:44 AM

## 2023-04-30 ENCOUNTER — Ambulatory Visit: Payer: Medicaid Other | Attending: Family Medicine | Admitting: Audiologist

## 2023-04-30 ENCOUNTER — Ambulatory Visit: Payer: Medicaid Other | Admitting: Orthopaedic Surgery

## 2023-04-30 DIAGNOSIS — H9313 Tinnitus, bilateral: Secondary | ICD-10-CM | POA: Diagnosis present

## 2023-04-30 DIAGNOSIS — H903 Sensorineural hearing loss, bilateral: Secondary | ICD-10-CM | POA: Insufficient documentation

## 2023-04-30 NOTE — Procedures (Signed)
  Outpatient Audiology and [facility] [address] [place], [place]  [address] [phone]  AUDIOLOGICAL  EVALUATION  NAME: Charlotte Perez     DOB:   11/16/1960      MRN: [id number]                                                                                     DATE: 04/30/2023     REFERENT: Joesph Annabella HERO, FNP STATUS: Outpatient DIAGNOSIS: Sensorineural Hearing Loss, Tinnitus   History: [name] was seen for an audiological evaluation due to loud humming sound that started in both ears in September. [name] was under a lot of new stress at the time of the tinnitus onset. The sound is like a dead TV station. It it now keeping her up at night. It is always present and bothersome. [name] denies any difficulty hearing, except that she cannot her her husband when he is at a distance. [name] denies pain or pressure in either ear. [name] denies history of hazardous noise exposure.  Medical history shows type 2 diabetes which is a risk for hearing loss.    Evaluation:  Otoscopy showed a clear view of the tympanic membranes, bilaterally Tympanometry results were consistent with normal middle ear function, bilaterally   Audiometric testing was completed using Conventional Audiometry techniques with insert earphones and supraural headphones. Test results are consistent with mild sloping to moderate severe sensorineural hearing loss bilaterally. Speech Recognition Thresholds were obtained at 45dB HL in the right ear and at 45dB HL in the left ear. Word Recognition Testing was completed at  40dB SL and [name] scored 100% in each ear.    Results:  The test results were reviewed with [name] and her husband. [name] has a moderately severe high pitched sensorineural  hearing loss that is most likely the source of the buzzing sound. The stress will make tinnitus worse. Audiogram printed and provided to [name].    Recommendations: Hearing aids recommended for both ears.  Patient given list of local hearing aid providers. Hearing aids are the best course of treatment for tinnitus. [name] was given a handout on how to call [facility] to check for a benefit through there insurance.  Annual audiometric testing recommended to monitor hearing loss for progression.    36 minutes spent testing and counseling on results.   If you have any questions please feel free to contact me at [phone].  [name] Au.D.  Audiologist   04/30/2023  9:50 AM  Cc: Joesph Annabella HERO, FNP

## 2023-05-01 ENCOUNTER — Ambulatory Visit: Payer: Medicaid Other | Admitting: Orthopaedic Surgery

## 2023-05-01 ENCOUNTER — Ambulatory Visit (INDEPENDENT_AMBULATORY_CARE_PROVIDER_SITE_OTHER): Payer: Medicaid Other | Admitting: Family Medicine

## 2023-05-01 ENCOUNTER — Encounter: Payer: Self-pay | Admitting: Family Medicine

## 2023-05-01 ENCOUNTER — Ambulatory Visit: Payer: Medicaid Other | Admitting: Family Medicine

## 2023-05-01 ENCOUNTER — Encounter: Payer: Self-pay | Admitting: Orthopaedic Surgery

## 2023-05-01 VITALS — BP 113/78 | HR 99 | Ht 59.0 in | Wt 129.1 lb

## 2023-05-01 VITALS — BP 127/89 | HR 99 | Temp 97.2°F | Ht 59.0 in | Wt 129.6 lb

## 2023-05-01 DIAGNOSIS — M25512 Pain in left shoulder: Secondary | ICD-10-CM | POA: Diagnosis not present

## 2023-05-01 DIAGNOSIS — G8929 Other chronic pain: Secondary | ICD-10-CM

## 2023-05-01 DIAGNOSIS — R112 Nausea with vomiting, unspecified: Secondary | ICD-10-CM

## 2023-05-01 MED ORDER — ONDANSETRON HCL 4 MG PO TABS: 4.0000 mg | ORAL_TABLET | Freq: Three times a day (TID) | ORAL | 0 refills | Status: DC | PRN

## 2023-05-01 NOTE — Progress Notes (Signed)
 Subjective:  Patient ID: Charlotte Perez, female    DOB: [birth year], 63 y.o.   MRN: [id number]  Patient Care Team: Joesph Annabella HERO, FNP as PCP - General (Family Medicine)   Chief Complaint:  Abdominal Pain, Nausea, and Vomiting (X 4 days after eating spaghetti and salad )   HPI: Charlotte Perez is a 63 y.o. female presenting on 05/01/2023 for Abdominal Pain, Nausea, and Vomiting (X 4 days after eating spaghetti and salad )   Discussed the use of AI scribe software for clinical note transcription with the patient, who gave verbal consent to proceed.  History of Present Illness   Charlotte Perez is a 63 year old female who presents with abdominal pain and vomiting.  She has been experiencing abdominal pain and vomiting for three days. The vomiting occurs every few minutes, and she is unable to retain food or liquids, leading to significant discomfort. No diarrhea is present.  The symptoms began after consuming a meal of spaghetti and salad. The salad contained sliced ham, turkey, cheese, and lettuce, which was washed prior to consumption. She notes that she could only taste the salad the following day. No one else who ate the salad has reported similar symptoms.  She urinates approximately three to four times a day, suggesting adequate hydration despite her symptoms.  She mentions that her blood sugars have been fluctuating, although she does not report any extremely high values.          Relevant past medical, surgical, family, and social history reviewed and updated as indicated.  Allergies and medications reviewed and updated. Data reviewed: Chart in Epic.   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   Arthritis    Asthma    Bipolar disorder (HCC)    Chronic back pain    Chronic neck pain    Constipation 09/05/2016   COPD (chronic obstructive pulmonary disease) (HCC)    COPD with asthma (HCC)  03/06/2022   DDD (degenerative disc disease), cervical    DDD (degenerative disc disease), lumbar    Depression    Diabetes mellitus (HCC) 06/27/2010   Diabetes mellitus, type II (HCC)    Gallbladder sludge    GERD (gastroesophageal reflux disease)    Hyperlipidemia    Hypertension    Iron deficiency anemia 09/05/2016   Lumbar radiculopathy    Mole (skin)    Neuropathy of foot    Renal cyst    Shingles    Skin lesions, generalized    1.2 CM FLAT FAWN COLOR AT THE T10 AREA JUST RIGHT OF SPINE     Past Surgical History:  Procedure Laterality Date   ABDOMINAL HYSTERECTOMY     total hysterectomy   BACK SURGERY     BIOPSY  03/07/2021   Procedure: BIOPSY;  Surgeon: Eartha Angelia Sieving, MD;  Location: [facility];  Service: Gastroenterology;;   CESAREAN SECTION     x 2   ESOPHAGOGASTRODUODENOSCOPY (EGD) WITH PROPOFOL  N/A 03/07/2021   Procedure: ESOPHAGOGASTRODUODENOSCOPY (EGD) WITH PROPOFOL ;  Surgeon: Eartha Angelia Sieving, MD;  Location: [facility];  Service: Gastroenterology;  Laterality: N/A;  9:15   NECK SURGERY      Social History   Socioeconomic History   Marital status: Legally Separated    Spouse name: Lyndy   Number of children: 3   Years of education: 9   Highest education level: 9th grade  Occupational History   Occupation: disablility  Tobacco Use   Smoking status: Never    Passive exposure: Yes   Smokeless tobacco: Never  Vaping Use   Vaping status: Never Used  Substance and Sexual Activity   Alcohol use: No   Drug use: No   Sexual activity: Not Currently    Birth control/protection: Surgical    Comment: hyst  Other Topics Concern   Not on file  Social History Narrative   Not on file   Social Drivers of Health   Financial Resource Strain: Medium Risk (07/10/2022)   Overall Financial Resource Strain (CARDIA)    Difficulty of Paying Living Expenses: Somewhat hard  Food Insecurity: Food Insecurity Present (07/10/2022)   Hunger Vital  Sign    Worried About Running Out of Food in the Last Year: Often true    Ran Out of Food in the Last Year: Sometimes true  Transportation Needs: Unmet Transportation Needs (07/10/2022)   PRAPARE - Transportation    Lack of Transportation (Medical): No    Lack of Transportation (Non-Medical): Yes  Physical Activity: Insufficiently Active (07/10/2022)   Exercise Vital Sign    Days of Exercise per Week: 2 days    Minutes of Exercise per Session: 20 min  Stress: Stress Concern Present (07/10/2022)   [employer] of Occupational Health - Occupational Stress Questionnaire    Feeling of Stress : To some extent  Social Connections: Socially Isolated (07/10/2022)   Social Connection and Isolation Panel [NHANES]    Frequency of Communication with Friends and Family: Once a week    Frequency of Social Gatherings with Friends and Family: Never    Attends Religious Services: Never    Database Administrator or Organizations: No    Attends Banker Meetings: Never    Marital Status: Married  Catering Manager Violence: Not At Risk (07/10/2022)   Humiliation, Afraid, Rape, and Kick questionnaire    Fear of Current or Ex-Partner: No    Emotionally Abused: No    Physically Abused: No    Sexually Abused: No    Outpatient Encounter Medications as of 05/01/2023  Medication Sig   ACCU-CHEK FASTCLIX LANCETS MISC EVERY DAY   albuterol  (PROVENTIL ) (2.5 MG/3ML) 0.083% nebulizer solution Take 3 mLs (2.5 mg total) by nebulization every 6 (six) hours as needed for wheezing or shortness of breath.   albuterol  (VENTOLIN  HFA) 108 (90 Base) MCG/ACT inhaler Inhale 2 puffs into the lungs every 6 (six) hours as needed for wheezing or shortness of breath.   ALPRAZolam  (XANAX ) 1 MG tablet Take 1 tablet (1 mg total) by mouth at bedtime as needed for anxiety.   atorvastatin  (LIPITOR) 40 MG tablet TAKE ONE (1) TABLET BY MOUTH EVERY DAY   Boric Acid Vaginal ([employer] BORIC ACID) 600 MG SUPP Place 1 suppository  vaginally at bedtime.   Budeson-Glycopyrrol-Formoterol (BREZTRI  AEROSPHERE) 160-9-4.8 MCG/ACT AERO Inhale 2 puffs into the lungs 2 (two) times daily.   clobetasol  ointment (TEMOVATE ) 0.05 % Pea-sized amount to affected area as needed- at most nightly   cycloSPORINE (RESTASIS) 0.05 % ophthalmic emulsion Place 1 drop into both eyes at bedtime.   dicyclomine  (BENTYL ) 10 MG capsule Take 1 capsule (10 mg total) by mouth 2 times daily at 12 noon and 4 pm.   Dulaglutide  (TRULICITY ) 0.75 MG/0.5ML SOAJ Inject 0.75 mg into the skin once a week.   empagliflozin  (JARDIANCE ) 25 MG TABS tablet Take 1 tablet (25 mg total) by mouth daily.   ferrous sulfate 324 MG TBEC Take 324 mg by mouth.  fluconazole  (DIFLUCAN ) 150 MG tablet Take one tablet every other day for one week, then weekly for 2 mos   glucose blood (ACCU-CHEK GUIDE) test strip Use daily Dx E11.9   HYDROcodone -acetaminophen  (NORCO) 10-325 MG tablet Take 1 tablet by mouth 3 (three) times daily as needed for pain.   latanoprost (XALATAN) 0.005 % ophthalmic solution Place 1 drop into both eyes at bedtime.   LINZESS  145 MCG CAPS capsule TAKE 1 CAPSULE BY MOUTH EVERY MORNING BEFORE BREAKFAST   lisinopril  (ZESTRIL ) 5 MG tablet TAKE 1/2 TABLET BY MOUTH ONCE DAILY   meclizine (ANTIVERT) 25 MG tablet Take 50 mg by mouth 2 (two) times daily.   metFORMIN  (GLUCOPHAGE ) 1000 MG tablet TAKE ONE TABLET BY MOUTH TWICE DAILY WITH MEALS   omeprazole  (PRILOSEC) 40 MG capsule Take 1 capsule (40 mg total) by mouth daily. TAKE ONE (1) CAPSULE EACH DAY Strength: 40 mg   ondansetron  (ZOFRAN ) 4 MG tablet Take 1 tablet (4 mg total) by mouth every 8 (eight) hours as needed for nausea or vomiting.   pregabalin  (LYRICA ) 150 MG capsule Take 1 capsule (150 mg total) by mouth 2 (two) times daily.   venlafaxine  XR (EFFEXOR -XR) 150 MG 24 hr capsule Take 2 capsules (300 mg total) by mouth at bedtime.   [DISCONTINUED] ondansetron  (ZOFRAN -ODT) 8 MG disintegrating tablet TAKE 1 TABLET BY  MOUTH EVERY 6 HOURS AS NEEDED FOR NAUSEA & VOMITING   No facility-administered encounter medications on file as of 05/01/2023.    Allergies  Allergen Reactions   Cymbalta  [Duloxetine  Hcl]     Per pt it started making her mouth tingle and could not taste anything   Gabapentin     Itching, fidgety   Meloxicam  Other (See Comments)    Tongue tingling, and lost sense of taste.   Tramadol  Other (See Comments)    Tongue tingling, and lost sense of taste.    Pertinent ROS per HPI, otherwise unremarkable      Objective:  BP 127/89   Pulse 99   Temp (!) 97.2 F (36.2 C)   Ht 4' 11 (1.499 m)   Wt 129 lb 9.6 oz (58.8 kg)   SpO2 96%   BMI 26.18 kg/m    Wt Readings from Last 3 Encounters:  05/01/23 129 lb 9.6 oz (58.8 kg)  05/01/23 129 lb 2 oz (58.6 kg)  04/02/23 133 lb (60.3 kg)    Physical Exam Vitals and nursing note reviewed.  Constitutional:      General: She is not in acute distress.    Appearance: She is well-developed. She is not ill-appearing, toxic-appearing or diaphoretic.  HENT:     Head: Normocephalic and atraumatic.     Mouth/Throat:     Mouth: Mucous membranes are moist.     Pharynx: Oropharynx is clear.  Eyes:     Conjunctiva/sclera: Conjunctivae normal.     Pupils: Pupils are equal, round, and reactive to light.  Cardiovascular:     Rate and Rhythm: Normal rate and regular rhythm.     Heart sounds: Normal heart sounds.  Pulmonary:     Effort: Pulmonary effort is normal.     Breath sounds: Normal breath sounds.  Abdominal:     General: Bowel sounds are normal. There is no distension.     Palpations: Abdomen is soft.     Tenderness: There is no abdominal tenderness.  Skin:    General: Skin is warm and dry.     Capillary Refill: Capillary refill takes less than 2  seconds.     Comments: Normal turgor  Neurological:     General: No focal deficit present.     Mental Status: She is alert and oriented to person, place, and time.  Psychiatric:        Mood  and Affect: Mood normal.        Behavior: Behavior normal.        Thought Content: Thought content normal.        Judgment: Judgment normal.      Results for orders placed or performed in visit on 03/06/23  Microalbumin / creatinine urine ratio   Collection Time: 03/06/23  8:28 AM  Result Value Ref Range   Creatinine, Urine 61.4 Not Estab. mg/dL   Microalbumin, Urine 4.3 Not Estab. ug/mL   Microalb/Creat Ratio 7 0 - 29 mg/g creat  Bayer DCA Hb A1c Waived   Collection Time: 03/06/23  8:28 AM  Result Value Ref Range   HB A1C (BAYER DCA - WAIVED) 7.7 (H) 4.8 - 5.6 %       Pertinent labs & imaging results that were available during my care of the patient were reviewed by me and considered in my medical decision making.  Assessment & Plan:  Dee was seen today for abdominal pain, nausea and vomiting.  Diagnoses and all orders for this visit:  Nausea and vomiting in adult -     CMP14+EGFR -     CBC with Differential/Platelet -     ondansetron  (ZOFRAN ) 4 MG tablet; Take 1 tablet (4 mg total) by mouth every 8 (eight) hours as needed for nausea or vomiting.     Assessment and Plan    Gastroenteritis Acute onset of vomiting and abdominal pain following consumption of a salad. Symptoms have persisted for three days. Differential diagnosis includes foodborne illness, viral gastroenteritis (norovirus), and influenza. No diarrhea reported. Urination frequency indicates adequate hydration. Discussed potential causes, importance of hydration, and monitoring urine color. Explained use of Zofran  for nausea and vomiting, with instructions to take every 6-8 hours as needed. Advised on BRAT diet and other bland foods. Informed that viral illnesses typically last 7-14 days and to contact the clinic if symptoms persist or if Zofran  is ineffective. - Order lab work to check potassium, sodium, and white blood cell count. - Prescribe Zofran  (ondansetron ) for nausea and vomiting, to be taken every  6-8 hours as needed. - Advise to maintain hydration and monitor urine color to ensure it is pale yellow. - Recommend a BRAT diet (bananas, rice, applesauce, toast) and other bland foods such as mashed potatoes and scrambled eggs without seasoning. - Instruct to contact the clinic if symptoms persist beyond 7-14 days or if Zofran  is ineffective.  Diabetes Mellitus Blood sugar levels have been fluctuating but no significantly high readings noted. No specific concerns raised during this visit. - Monitor blood sugar levels regularly.  Follow-up - Send Zofran  prescription to the drugstore in [place]. - Perform lab work today to check blood levels. - Advise to contact the clinic if symptoms persist or if there is a decrease in urination.          Continue all other maintenance medications.  Follow up plan: Return if symptoms worsen or fail to improve.   Continue healthy lifestyle choices, including diet (rich in fruits, vegetables, and lean proteins, and low in salt and simple carbohydrates) and exercise (at least 30 minutes of moderate physical activity daily).  Educational handout given for N/V  The above assessment and management  plan was discussed with the patient. The patient verbalized understanding of and has agreed to the management plan. Patient is aware to call the clinic if they develop any new symptoms or if symptoms persist or worsen. Patient is aware when to return to the clinic for a follow-up visit. Patient educated on when it is appropriate to go to the emergency department.   Rosaline Bruns, FNP-C [facility] [phone]

## 2023-05-01 NOTE — Progress Notes (Signed)
 I still hurt.  Her left shoulder is still bothering her.  The injections have helped but she still has pain more with overhead use and rolling on it at night.  She has no new trauma.  ROM of the left shoulder is abduction 145, forward 160, internal 20, external 20, extension 5, adduction full.  NV intact. Grips normal. ROM neck normal.  Encounter Diagnosis  Name Primary?   Chronic pain in left shoulder Yes   I will get MRI of the left shoulder as she is not improving.  Call if any problem.  Precautions discussed.  Electronically Signed Lemond Stable, MD 2/6/20258:09 AM

## 2023-05-01 NOTE — Patient Instructions (Signed)
 Central scheduling [phone]

## 2023-05-01 NOTE — Addendum Note (Signed)
 Addended by: Georgann Kim on: 05/01/2023 08:21 AM   Modules accepted: Orders

## 2023-05-02 ENCOUNTER — Encounter: Payer: Self-pay | Admitting: Family Medicine

## 2023-05-02 LAB — CBC WITH DIFFERENTIAL/PLATELET
Basophils Absolute: 0.1 10*3/uL (ref 0.0–0.2)
Basos: 1 %
EOS (ABSOLUTE): 0.2 10*3/uL (ref 0.0–0.4)
Eos: 3 %
Hematocrit: 45 % (ref 34.0–46.6)
Hemoglobin: 14.2 g/dL (ref 11.1–15.9)
Immature Grans (Abs): 0 10*3/uL (ref 0.0–0.1)
Immature Granulocytes: 0 %
Lymphocytes Absolute: 1.5 10*3/uL (ref 0.7–3.1)
Lymphs: 25 %
MCH: 29.2 pg (ref 26.6–33.0)
MCHC: 31.6 g/dL (ref 31.5–35.7)
MCV: 93 fL (ref 79–97)
Monocytes Absolute: 0.4 10*3/uL (ref 0.1–0.9)
Monocytes: 8 %
Neutrophils Absolute: 3.6 10*3/uL (ref 1.4–7.0)
Neutrophils: 63 %
Platelets: 289 10*3/uL (ref 150–450)
RBC: 4.86 x10E6/uL (ref 3.77–5.28)
RDW: 13.3 % (ref 11.7–15.4)
WBC: 5.7 10*3/uL (ref 3.4–10.8)

## 2023-05-02 LAB — CMP14+EGFR
ALT: 17 [IU]/L (ref 0–32)
AST: 16 [IU]/L (ref 0–40)
Albumin: 4.7 g/dL (ref 3.9–4.9)
Alkaline Phosphatase: 89 [IU]/L (ref 44–121)
BUN/Creatinine Ratio: 24 (ref 12–28)
BUN: 17 mg/dL (ref 8–27)
Bilirubin Total: <0.2 mg/dL (ref 0.0–1.2)
CO2: 21 mmol/L (ref 20–29)
Calcium: 10.4 mg/dL — ABNORMAL HIGH (ref 8.7–10.3)
Chloride: 98 mmol/L (ref 96–106)
Creatinine, Ser: 0.71 mg/dL (ref 0.57–1.00)
Globulin, Total: 3 g/dL (ref 1.5–4.5)
Glucose: 131 mg/dL — ABNORMAL HIGH (ref 70–99)
Potassium: 5 mmol/L (ref 3.5–5.2)
Sodium: 140 mmol/L (ref 134–144)
Total Protein: 7.7 g/dL (ref 6.0–8.5)
eGFR: 95 mL/min/{1.73_m2} (ref >59)

## 2023-05-04 ENCOUNTER — Ambulatory Visit (HOSPITAL_COMMUNITY): Payer: Medicaid Other

## 2023-05-10 ENCOUNTER — Ambulatory Visit (HOSPITAL_COMMUNITY): Admission: RE | Admit: 2023-05-10 | Payer: Medicaid Other | Source: Ambulatory Visit

## 2023-05-14 ENCOUNTER — Ambulatory Visit: Payer: Medicaid Other | Admitting: Orthopaedic Surgery

## 2023-05-15 ENCOUNTER — Other Ambulatory Visit: Payer: Self-pay | Admitting: Family Medicine

## 2023-05-15 DIAGNOSIS — E119 Type 2 diabetes mellitus without complications: Secondary | ICD-10-CM | POA: Diagnosis not present

## 2023-05-15 DIAGNOSIS — M545 Low back pain, unspecified: Secondary | ICD-10-CM | POA: Diagnosis not present

## 2023-05-15 DIAGNOSIS — E1165 Type 2 diabetes mellitus with hyperglycemia: Secondary | ICD-10-CM

## 2023-05-15 DIAGNOSIS — E1142 Type 2 diabetes mellitus with diabetic polyneuropathy: Secondary | ICD-10-CM

## 2023-05-15 DIAGNOSIS — Z79899 Other long term (current) drug therapy: Secondary | ICD-10-CM | POA: Diagnosis not present

## 2023-05-19 DIAGNOSIS — Z79899 Other long term (current) drug therapy: Secondary | ICD-10-CM | POA: Diagnosis not present

## 2023-05-21 ENCOUNTER — Ambulatory Visit: Payer: Medicaid Other | Admitting: Orthopaedic Surgery

## 2023-05-26 ENCOUNTER — Ambulatory Visit (HOSPITAL_COMMUNITY): Payer: Medicaid Other

## 2023-05-27 ENCOUNTER — Ambulatory Visit (HOSPITAL_COMMUNITY)
Admission: RE | Admit: 2023-05-27 | Discharge: 2023-05-27 | Disposition: A | Discharge status: Home / Self Care | Source: Ambulatory Visit | Attending: Orthopaedic Surgery | Admitting: Orthopaedic Surgery

## 2023-05-27 DIAGNOSIS — M7582 Other shoulder lesions, left shoulder: Secondary | ICD-10-CM | POA: Diagnosis not present

## 2023-05-27 DIAGNOSIS — G8929 Other chronic pain: Secondary | ICD-10-CM | POA: Diagnosis present

## 2023-05-27 DIAGNOSIS — M25512 Pain in left shoulder: Secondary | ICD-10-CM | POA: Insufficient documentation

## 2023-05-27 DIAGNOSIS — S46212A Strain of muscle, fascia and tendon of other parts of biceps, left arm, initial encounter: Secondary | ICD-10-CM | POA: Diagnosis not present

## 2023-05-27 DIAGNOSIS — M129 Arthropathy, unspecified: Secondary | ICD-10-CM | POA: Diagnosis not present

## 2023-06-05 ENCOUNTER — Ambulatory Visit: Payer: Medicaid Other | Admitting: Orthopaedic Surgery

## 2023-06-05 ENCOUNTER — Encounter: Payer: Self-pay | Admitting: Orthopaedic Surgery

## 2023-06-05 DIAGNOSIS — M25512 Pain in left shoulder: Secondary | ICD-10-CM

## 2023-06-05 DIAGNOSIS — G8929 Other chronic pain: Secondary | ICD-10-CM | POA: Diagnosis not present

## 2023-06-05 MED ORDER — METHYLPREDNISOLONE ACETATE 40 MG/ML IJ SUSP
40.0000 mg | Freq: Once | INTRAMUSCULAR | Status: AC
  Administered 2023-06-05: 40 mg via INTRA_ARTICULAR

## 2023-06-05 NOTE — Progress Notes (Signed)
 My shoulder still hurts.  Her MRI of the shoulder showed: IMPRESSION: 1. Moderate supraspinatus tendinosis with irregularity along the articular surface. 2. Moderate tendinosis of the intra-articular portion of the long head of the biceps tendon with a partial-thickness tear.  I have explained the findings to her.  I have offered PT but she declines.  I have independently reviewed the MRI.    She has GERD but can take an occasional Advil or Aleve.  I have recommended she do this and also the use of Aspercreme, Biofreeze or Voltaren Gel.  ROM of the left shoulder is good today but more pain after 90 degrees of abduction and 110 of forward.  NV intact.  Encounter Diagnosis  Name Primary?   Chronic pain in left shoulder Yes   PROCEDURE NOTE:  The patient request injection, verbal consent was obtained.  The left shoulder was prepped appropriately after time out was performed.   Sterile technique was observed and injection of 1 cc of DepoMedrol 40mg  with several cc's of plain xylocaine. Anesthesia was provided by ethyl chloride and a 20-gauge needle was used to inject the shoulder area. A posterior approach was used.  The injection was tolerated well.  A band aid dressing was applied.  The patient was advised to apply ice later today and tomorrow to the injection sight as needed.  Return in six weeks.  Continue her exercises.  She is in pain management and they give her the pain medicine.  Call if any problem.  Precautions discussed.  Electronically Signed Darreld Mclean, MD 3/13/20258:19 AM

## 2023-06-06 ENCOUNTER — Ambulatory Visit: Payer: Medicaid Other | Admitting: Family Medicine

## 2023-06-09 ENCOUNTER — Other Ambulatory Visit: Payer: Self-pay | Admitting: Family Medicine

## 2023-06-09 ENCOUNTER — Ambulatory Visit: Payer: Medicaid Other | Admitting: Family Medicine

## 2023-06-12 ENCOUNTER — Ambulatory Visit: Admitting: Family Medicine

## 2023-06-12 ENCOUNTER — Ambulatory Visit: Payer: Medicaid Other | Admitting: Family Medicine

## 2023-06-12 ENCOUNTER — Encounter: Payer: Self-pay | Admitting: Family Medicine

## 2023-06-12 VITALS — BP 106/72 | HR 87 | Temp 98.4°F | Ht 59.0 in | Wt 123.8 lb

## 2023-06-12 DIAGNOSIS — K219 Gastro-esophageal reflux disease without esophagitis: Secondary | ICD-10-CM

## 2023-06-12 DIAGNOSIS — E785 Hyperlipidemia, unspecified: Secondary | ICD-10-CM | POA: Diagnosis not present

## 2023-06-12 DIAGNOSIS — E1159 Type 2 diabetes mellitus with other circulatory complications: Secondary | ICD-10-CM | POA: Diagnosis not present

## 2023-06-12 DIAGNOSIS — Z79899 Other long term (current) drug therapy: Secondary | ICD-10-CM | POA: Diagnosis not present

## 2023-06-12 DIAGNOSIS — E1165 Type 2 diabetes mellitus with hyperglycemia: Secondary | ICD-10-CM | POA: Diagnosis not present

## 2023-06-12 DIAGNOSIS — R300 Dysuria: Secondary | ICD-10-CM | POA: Diagnosis not present

## 2023-06-12 DIAGNOSIS — Z7984 Long term (current) use of oral hypoglycemic drugs: Secondary | ICD-10-CM

## 2023-06-12 DIAGNOSIS — Z7985 Long-term (current) use of injectable non-insulin antidiabetic drugs: Secondary | ICD-10-CM | POA: Diagnosis not present

## 2023-06-12 DIAGNOSIS — N3000 Acute cystitis without hematuria: Secondary | ICD-10-CM

## 2023-06-12 DIAGNOSIS — E1169 Type 2 diabetes mellitus with other specified complication: Secondary | ICD-10-CM | POA: Diagnosis not present

## 2023-06-12 DIAGNOSIS — E119 Type 2 diabetes mellitus without complications: Secondary | ICD-10-CM | POA: Diagnosis not present

## 2023-06-12 DIAGNOSIS — F411 Generalized anxiety disorder: Secondary | ICD-10-CM

## 2023-06-12 DIAGNOSIS — J4489 Other specified chronic obstructive pulmonary disease: Secondary | ICD-10-CM

## 2023-06-12 DIAGNOSIS — F321 Major depressive disorder, single episode, moderate: Secondary | ICD-10-CM

## 2023-06-12 DIAGNOSIS — M545 Low back pain, unspecified: Secondary | ICD-10-CM | POA: Diagnosis not present

## 2023-06-12 DIAGNOSIS — I152 Hypertension secondary to endocrine disorders: Secondary | ICD-10-CM

## 2023-06-12 LAB — URINALYSIS, ROUTINE W REFLEX MICROSCOPIC
Bilirubin, UA: NEGATIVE
Nitrite, UA: NEGATIVE
Specific Gravity, UA: 1.02 (ref 1.005–1.030)
Urobilinogen, Ur: 0.2 mg/dL (ref 0.2–1.0)
pH, UA: 5.5 (ref 5.0–7.5)

## 2023-06-12 LAB — MICROSCOPIC EXAMINATION
Renal Epithel, UA: NONE SEEN /HPF
WBC, UA: >30 /HPF — AB (ref 0–5)

## 2023-06-12 LAB — BAYER DCA HB A1C WAIVED: HB A1C (BAYER DCA - WAIVED): 7.5 % — ABNORMAL HIGH (ref 4.8–5.6)

## 2023-06-12 MED ORDER — TRULICITY 1.5 MG/0.5ML ~~LOC~~ SOAJ
1.5000 mg | SUBCUTANEOUS | 11 refills | Status: DC
Start: 2023-06-12 — End: 2023-09-12

## 2023-06-12 MED ORDER — CEPHALEXIN 500 MG PO CAPS: 500.0000 mg | ORAL_CAPSULE | Freq: Two times a day (BID) | ORAL | 0 refills | Status: AC

## 2023-06-12 NOTE — Progress Notes (Signed)
 Established Patient Office Visit  Subjective   Patient ID: Charlotte Perez, female    DOB: Aug 23, 1960  Age: 63 y.o. MRN: [id number]  Chief Complaint  Patient presents with   Medical Management of Chronic Issues   Dysuria    HPI T2DM Pt presents for follow up evaluation of Type 2 diabetes mellitus. Patient denies foot ulcerations, increased appetite, nausea, polydipsia, polyuria, visual disturbances, vomiting, and weight loss.  Current diabetic medications include jardiance, metformin, trulicity Compliant with meds - Yes  She did not tolerate ozempic. Unable to get mounjaro- PA denied  Current monitoring regimen: most days Home blood sugar records: fasting 100-160s Any episodes of hypoglycemia? no  Current diet: "pretty good" Current exercise: none  Is She on ACE inhibitor or angiotensin II receptor blocker?  Yes, lisinopril Is She on statin? Yes atorvastatin  2. HTN Complaint with meds - Yes Current Medications - lisinopril Pertinent ROS:  Headache - No Fatigue - No Visual Disturbances - No Chest pain - No Dyspnea - No Palpitations - No LE edema - No  3. HLD On atorvastatin. Regular diet. No exercise. Denies side effects.   4. GERD Compliant with medications - No Current medications - omeprazole.  Sore throat - No Voice change - No Hemoptysis - No Dysphagia or dyspepsia - No Water brash - No Red Flags (weight loss, hematochezia, melena, weight loss, early satiety, fevers, odynophagia, or persistent vomiting) - No  5. COPD/asthma Reports stable. No cough. No shortness of breath or wheezing. Compliant with breztri. Not having to use albuterol lately.   6. Depression/anxiety Managed by [facility]. Reports ok. Denies SI.   7. Dysuria For 3 days, worsening. Also reports cloudy urine with frequency and urgency. Denies hematuria, flank pain, fever, chills, nausea, or vomiting.      06/12/2023    9:46 AM 03/24/2023    9:42 AM 01/29/2023    9:24 AM  Depression  screen PHQ 2/9  Decreased Interest 3 2   Down, Depressed, Hopeless 2 3   PHQ - 2 Score 5 5   Altered sleeping 2 2   Tired, decreased energy 2 2   Change in appetite 2 1   Feeling bad or failure about yourself  2 2   Trouble concentrating 2 1   Moving slowly or fidgety/restless 0 0   Suicidal thoughts 0 0   PHQ-9 Score 15 13   Difficult doing work/chores Somewhat difficult Somewhat difficult      Information is confidential and restricted. Go to Review Flowsheets to unlock data.      06/12/2023    9:47 AM 03/24/2023    9:42 AM 01/29/2023    9:25 AM 11/27/2022    9:52 AM  GAD 7 : Generalized Anxiety Score  Nervous, Anxious, on Edge 2 1  1   Control/stop worrying 2 2  2   Worry too much - different things 1 2  2   Trouble relaxing 2 3  3   Restless 1 1  1   Easily annoyed or irritable 0 0  1  Afraid - awful might happen 0 0  1  Total GAD 7 Score 8 9  11   Anxiety Difficulty Somewhat difficult Somewhat difficult  Somewhat difficult     Information is confidential and restricted. Go to Review Flowsheets to unlock data.     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   Arthritis    Asthma    Bipolar disorder (HCC)  Chronic back pain    Chronic neck pain    Constipation 09/05/2016   COPD (chronic obstructive pulmonary disease) (HCC)    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   DDD (degenerative disc disease), cervical    DDD (degenerative disc disease), lumbar    Depression    Diabetes mellitus (HCC) 06/27/2010   Diabetes mellitus, type II (HCC)    Gallbladder sludge    GERD (gastroesophageal reflux disease)    Hyperlipidemia    Hypertension    Iron deficiency anemia 09/05/2016   Lumbar radiculopathy    Mole (skin)    Neuropathy of foot    Renal cyst    Shingles    Skin lesions, generalized    1.2 CM FLAT FAWN COLOR AT THE T10 AREA JUST RIGHT OF SPINE       ROS As per HPI.    Objective:     BP 106/72    Pulse 87   Temp 98.4 F (36.9 C) (Temporal)   Ht 4\' 11"  (1.499 m)   Wt 123 lb 12.8 oz (56.2 kg)   SpO2 98%   BMI 25.00 kg/m  BP Readings from Last 3 Encounters:  06/12/23 106/72  05/01/23 127/89  05/01/23 113/78   Wt Readings from Last 3 Encounters:  06/12/23 123 lb 12.8 oz (56.2 kg)  05/01/23 129 lb 9.6 oz (58.8 kg)  05/01/23 129 lb 2 oz (58.6 kg)   No data found.   Physical Exam Vitals and nursing note reviewed.  Constitutional:      General: She is not in acute distress.    Appearance: Normal appearance. She is not ill-appearing, toxic-appearing or diaphoretic.  Neck:     Vascular: No carotid bruit.  Cardiovascular:     Rate and Rhythm: Normal rate and regular rhythm.     Pulses: Normal pulses.     Heart sounds: Normal heart sounds. No murmur heard. Pulmonary:     Effort: Pulmonary effort is normal. No respiratory distress.     Breath sounds: Normal breath sounds.  Abdominal:     General: Bowel sounds are normal. There is no distension.     Palpations: Abdomen is soft.     Tenderness: There is no abdominal tenderness. There is no right CVA tenderness, left CVA tenderness, guarding or rebound.  Musculoskeletal:     Cervical back: Neck supple. No edema or rigidity. No spinous process tenderness or muscular tenderness.     Right lower leg: No edema.     Left lower leg: No edema.  Skin:    General: Skin is warm and dry.  Neurological:     General: No focal deficit present.     Mental Status: She is alert and oriented to person, place, and time.  Psychiatric:        Mood and Affect: Mood normal.        Behavior: Behavior normal.        Thought Content: Thought content normal.        Judgment: Judgment normal.    No results found for any visits on 06/12/23.   The 10-year ASCVD risk score (Arnett DK, et al., 2019) is: 6.1%    Assessment & Plan:   Type 2 diabetes mellitus with hyperglycemia, without long-term current use of insulin (HCC) Assessment &  Plan: A1c 7.5 today, not goal of <7. Medication changes today: increase trulicity to 1.5 mg weekly. Continue jardiance, metformin. Didn't tolerate ozempic. She is on an ACE/ARB and statin. Diet and exercise.  Orders: -     Bayer DCA Hb A1c Waived -     Vitamin B12 -     Trulicity; Inject 1.5 mg into the skin once a week.  Dispense: 9 mL; Refill: 11  Long-term current use of injectable noninsulin antidiabetic medication  Long term current use of oral hypoglycemic drug  Hyperlipidemia associated with type 2 diabetes mellitus (HCC) Assessment & Plan: On statin. Last LDL 77. Will update fasting panel at next appt.   Hypertension associated with diabetes (HCC) Assessment & Plan: BP at goal. On lisinopril.   Gastroesophageal reflux disease without esophagitis Assessment & Plan: Well controlled with omeprazole.    COPD with asthma (HCC) Assessment & Plan: Stable with breztri. Albuterol prn. No acute exacerbation.    Moderate single current episode of major depressive disorder [facility]) Assessment & Plan: Managed by [facility]. Stable. Denies SI.    GAD (generalized anxiety disorder) Assessment & Plan: Managed by [facility]. Stable.    Acute cystitis without hematuria Keflex as below pending culture.  -     Urinalysis, Routine w reflex microscopic -     Urine Culture -     Cephalexin; Take 1 capsule (500 mg total) by mouth 2 (two) times daily for 7 days.  Dispense: 14 capsule; Refill: 0   Return in about 3 months (around 09/12/2023) for CPE.   The patient indicates understanding of these issues and agrees with the plan.  Gabriel Earing, FNP

## 2023-06-12 NOTE — Assessment & Plan Note (Signed)
 Managed by [facility] Stable. Denies SI.

## 2023-06-12 NOTE — Assessment & Plan Note (Signed)
 On statin. Last LDL 77. Will update fasting panel at next appt.

## 2023-06-12 NOTE — Assessment & Plan Note (Signed)
 BP at goal. On lisinopril.

## 2023-06-12 NOTE — Assessment & Plan Note (Signed)
 Stable with breztri. Albuterol prn. No acute exacerbation.

## 2023-06-12 NOTE — Assessment & Plan Note (Signed)
 Well controlled with omeprazole.

## 2023-06-12 NOTE — Assessment & Plan Note (Signed)
 Managed by [facility]. Stable.

## 2023-06-12 NOTE — Assessment & Plan Note (Signed)
 A1c 7.5 today, not goal of <7. Medication changes today: increase trulicity to 1.5 mg weekly. Continue jardiance, metformin. Didn't tolerate ozempic. She is on an ACE/ARB and statin. Diet and exercise.

## 2023-06-13 LAB — VITAMIN B12: Vitamin B-12: 265 pg/mL (ref 232–1245)

## 2023-06-15 LAB — URINE CULTURE

## 2023-06-16 ENCOUNTER — Encounter: Payer: Self-pay | Admitting: Family Medicine

## 2023-06-16 DIAGNOSIS — Z79899 Other long term (current) drug therapy: Secondary | ICD-10-CM | POA: Diagnosis not present

## 2023-06-24 ENCOUNTER — Telehealth: Payer: Self-pay | Admitting: Family Medicine

## 2023-06-24 ENCOUNTER — Other Ambulatory Visit: Payer: Self-pay | Admitting: Family Medicine

## 2023-06-24 NOTE — Telephone Encounter (Unsigned)
 Copied from CRM [id number]. Topic: General - Other >> Jun 24, 2023 12:13 PM Emylou G wrote: Reason for CRM: Strips are being sent for order from the pharmacy.. she is also needing the meter.. can you add it to the order?  Patient [id number]

## 2023-06-25 MED ORDER — ACCU-CHEK GUIDE ME W/DEVICE KIT: PACK | 0 refills | Status: AC

## 2023-06-25 NOTE — Telephone Encounter (Signed)
 Meter sent to [facility]

## 2023-07-10 DIAGNOSIS — E7800 Pure hypercholesterolemia, unspecified: Secondary | ICD-10-CM | POA: Diagnosis not present

## 2023-07-10 DIAGNOSIS — R5383 Other fatigue: Secondary | ICD-10-CM | POA: Diagnosis not present

## 2023-07-10 DIAGNOSIS — Z79899 Other long term (current) drug therapy: Secondary | ICD-10-CM | POA: Diagnosis not present

## 2023-07-10 DIAGNOSIS — E119 Type 2 diabetes mellitus without complications: Secondary | ICD-10-CM | POA: Diagnosis not present

## 2023-07-10 DIAGNOSIS — M545 Low back pain, unspecified: Secondary | ICD-10-CM | POA: Diagnosis not present

## 2023-07-10 DIAGNOSIS — E559 Vitamin D deficiency, unspecified: Secondary | ICD-10-CM | POA: Diagnosis not present

## 2023-07-15 ENCOUNTER — Other Ambulatory Visit (HOSPITAL_COMMUNITY): Payer: Self-pay

## 2023-07-16 ENCOUNTER — Ambulatory Visit: Admitting: Orthopaedic Surgery

## 2023-07-16 ENCOUNTER — Encounter: Payer: Self-pay | Admitting: Orthopaedic Surgery

## 2023-07-16 DIAGNOSIS — G8929 Other chronic pain: Secondary | ICD-10-CM

## 2023-07-16 DIAGNOSIS — M25512 Pain in left shoulder: Secondary | ICD-10-CM

## 2023-07-16 MED ORDER — METHYLPREDNISOLONE ACETATE 40 MG/ML IJ SUSP
40.0000 mg | Freq: Once | INTRAMUSCULAR | Status: AC
  Administered 2023-07-16: 40 mg via INTRA_ARTICULAR

## 2023-07-16 NOTE — Progress Notes (Signed)
 PROCEDURE NOTE:  The patient request injection, verbal consent was obtained.  The left shoulder was prepped appropriately after time out was performed.   Sterile technique was observed and injection of 1 cc of DepoMedrol 40mg  with several cc's of plain xylocaine . Anesthesia was provided by ethyl chloride and a 20-gauge needle was used to inject the shoulder area. A posterior approach was used.  The injection was tolerated well.  A band aid dressing was applied.  The patient was advised to apply ice later today and tomorrow to the injection sight as needed.  Encounter Diagnosis  Name Primary?   Chronic pain in left shoulder Yes   Return in six weeks.  Call if any problem.  Precautions discussed.  Electronically Signed Pleasant Brilliant, MD 4/23/20258:12 AM

## 2023-07-17 ENCOUNTER — Ambulatory Visit: Admitting: Orthopaedic Surgery

## 2023-07-21 ENCOUNTER — Encounter (HOSPITAL_COMMUNITY): Payer: Self-pay | Admitting: Psychiatry

## 2023-07-21 ENCOUNTER — Ambulatory Visit (INDEPENDENT_AMBULATORY_CARE_PROVIDER_SITE_OTHER): Payer: Medicaid Other | Admitting: Psychiatry

## 2023-07-21 VITALS — BP 130/92 | HR 91 | Ht 59.0 in | Wt 121.4 lb

## 2023-07-21 DIAGNOSIS — F321 Major depressive disorder, single episode, moderate: Secondary | ICD-10-CM

## 2023-07-21 DIAGNOSIS — F411 Generalized anxiety disorder: Secondary | ICD-10-CM | POA: Diagnosis not present

## 2023-07-21 MED ORDER — VENLAFAXINE HCL ER 150 MG PO CP24: 300.0000 mg | ORAL_CAPSULE | Freq: Every day | ORAL | 2 refills | Status: DC

## 2023-07-21 MED ORDER — ALPRAZOLAM 1 MG PO TABS: 1.0000 mg | ORAL_TABLET | Freq: Every evening | ORAL | 2 refills | Status: DC | PRN

## 2023-07-21 NOTE — Progress Notes (Signed)
 [facility] MD/PA/NP OP Progress Note  07/21/2023 9:27 AM Charlotte Perez  MRN:  [id number]  Chief Complaint:  Chief Complaint  Patient presents with   Depression   Anxiety   Follow-up   HPI: This patient is a 63 year old married white female who is living with her husband in [place].  She is on disability.  The patient returns to follow-up after 2 months regarding her depression and anxiety.  She states that generally she is doing well.  She is now on Trulicity and she states its helped her manage her weight and blood sugar and she is also feeling better in general.  She is sleeping better although her husband states she talks in her sleep.  She denies significant depression anxiety thoughts of self-harm or suicide.  Her energy seems to be better.  She and her husband have found a new place to live and she is gratified for this. Visit Diagnosis:    ICD-10-CM   1. Moderate single current episode of major depressive disorder (HCC)  F32.1     2. 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   DDD (degenerative disc disease), cervical    DDD (degenerative disc disease), lumbar    Depression    Diabetes mellitus (HCC) 06/27/2010   Diabetes mellitus, type II (HCC)    Gallbladder sludge    GERD (gastroesophageal reflux disease)    Hyperlipidemia    Hypertension    Iron deficiency anemia 09/05/2016   Lumbar radiculopathy    Mole (skin)    Neuropathy of foot    Renal cyst    Shingles    Skin lesions, generalized    1.2 CM FLAT FAWN COLOR AT THE T10 AREA JUST RIGHT OF SPINE     Past Surgical  History:  Procedure Laterality Date   ABDOMINAL HYSTERECTOMY     total hysterectomy   BACK SURGERY     BIOPSY  03/07/2021   Procedure: BIOPSY;  Surgeon: Urban Garden, MD;  [facility]: [facility];  Service: Gastroenterology;;   CESAREAN SECTION     x 2   ESOPHAGOGASTRODUODENOSCOPY (EGD) WITH PROPOFOL  N/A 03/07/2021   Procedure: ESOPHAGOGASTRODUODENOSCOPY (EGD) WITH PROPOFOL ;  Surgeon: Urban Garden, MD;  Location: [facility];  Service: Gastroenterology;  Laterality: N/A;  9:15   NECK SURGERY      Family Psychiatric History: See below  Family History:  Family History  Problem Relation Age of Onset   ADD / ADHD Other    COPD Father    Alcohol abuse Father    Depression Daughter    Alcohol abuse Mother    COPD Mother        on oxygen   Colon cancer Paternal Grandfather    Heart disease Sister    Heart disease Brother    Arthritis Sister        knee replacement   Diabetes Maternal Grandmother    Diabetes Sister     Social History:  Social History   Socioeconomic History   Marital status: Legally Separated  Spouse name: Kendra Pavy   Number of children: 3   Years of education: 9   Highest education level: 9th grade  Occupational History   Occupation: disablility  Tobacco Use   Smoking status: Never    Passive exposure: Yes   Smokeless tobacco: Never  Vaping Use   Vaping status: Never Used  Substance and Sexual Activity   Alcohol use: No   Drug use: No   Sexual activity: Not Currently    Birth control/protection: Surgical    Comment: hyst  Other Topics Concern   Not on file  Social History Narrative   Not on file   Social Drivers of Health   Financial Resource Strain: Medium Risk (07/10/2022)   Overall Financial Resource Strain (CARDIA)    Difficulty of Paying Living Expenses: Somewhat hard  Food Insecurity: Food Insecurity Present (07/10/2022)   Hunger Vital Sign    Worried About Running Out of Food in the Last Year: Often true     Ran Out of Food in the Last Year: Sometimes true  Transportation Needs: Unmet Transportation Needs (07/10/2022)   PRAPARE - Transportation    Lack of Transportation (Medical): No    Lack of Transportation (Non-Medical): Yes  Physical Activity: Insufficiently Active (07/10/2022)   Exercise Vital Sign    Days of Exercise per Week: 2 days    Minutes of Exercise per Session: 20 min  Stress: Stress Concern Present (07/10/2022)   [employer] of Occupational Health - Occupational Stress Questionnaire    Feeling of Stress : To some extent  Social Connections: Socially Isolated (07/10/2022)   Social Connection and Isolation Panel [NHANES]    Frequency of Communication with Friends and Family: Once a week    Frequency of Social Gatherings with Friends and Family: Never    Attends Religious Services: Never    Database administrator or Organizations: No    Attends Banker Meetings: Never    Marital Status: Married    Allergies:  Allergies  Allergen Reactions   Cymbalta  [Duloxetine  Hcl]     Per pt it started making her mouth tingle and could not taste anything   Gabapentin     Itching, fidgety   Meloxicam  Other (See Comments)    Tongue tingling, and lost sense of taste.   Tramadol  Other (See Comments)    Tongue tingling, and lost sense of taste.    Metabolic Disorder Labs: Lab Results  Component Value Date   HGBA1C 7.5 (H) 06/12/2023   No results found for: "PROLACTIN" Lab Results  Component Value Date   CHOL 162 02/07/2022   TRIG 81 02/07/2022   HDL 70 02/07/2022   CHOLHDL 2.3 02/07/2022   LDLCALC 77 02/07/2022   LDLCALC 69 05/11/2020   Lab Results  Component Value Date   TSH 0.644 08/26/2022   TSH 1.44 11/16/2020    Therapeutic Level Labs: No results found for: "LITHIUM" No results found for: "VALPROATE" No results found for: "CBMZ"  Current Medications: Current Outpatient Medications  Medication Sig Dispense Refill   ACCU-CHEK FASTCLIX LANCETS  MISC EVERY DAY 102 each 2   albuterol  (PROVENTIL ) (2.5 MG/3ML) 0.083% nebulizer solution Take 3 mLs (2.5 mg total) by nebulization every 6 (six) hours as needed for wheezing or shortness of breath. 75 mL 3   albuterol  (VENTOLIN  HFA) 108 (90 Base) MCG/ACT inhaler Inhale 2 puffs into the lungs every 6 (six) hours as needed for wheezing or shortness of breath. 8 g 0   atorvastatin  (LIPITOR) 40 MG  tablet TAKE ONE (1) TABLET BY MOUTH EVERY DAY 90 tablet 0   Blood Glucose Monitoring Suppl (ACCU-CHEK GUIDE ME) w/Device KIT Use daily Dx E11.9 1 kit 0   Boric Acid Vaginal ([employer] BORIC ACID) 600 MG SUPP Place 1 suppository vaginally at bedtime. 7 suppository 3   Budeson-Glycopyrrol-Formoterol (BREZTRI  AEROSPHERE) 160-9-4.8 MCG/ACT AERO Inhale 2 puffs into the lungs 2 (two) times daily. 10.7 g 11   clobetasol  ointment (TEMOVATE ) 0.05 % Pea-sized amount to affected area as needed- at most nightly 30 g 1   cycloSPORINE (RESTASIS) 0.05 % ophthalmic emulsion Place 1 drop into both eyes at bedtime.     dicyclomine  (BENTYL ) 10 MG capsule Take 1 capsule (10 mg total) by mouth 2 times daily at 12 noon and 4 pm. 60 capsule 2   Dulaglutide (TRULICITY) 1.5 MG/0.5ML SOAJ Inject 1.5 mg into the skin once a week. 9 mL 11   empagliflozin  (JARDIANCE ) 25 MG TABS tablet Take 1 tablet (25 mg total) by mouth daily. 90 tablet 3   glucose blood (ACCU-CHEK GUIDE TEST) test strip Use daily Dx E11.9 100 each 3   HYDROcodone -acetaminophen  (NORCO) 10-325 MG tablet Take 1 tablet by mouth 3 (three) times daily as needed for pain.     latanoprost (XALATAN) 0.005 % ophthalmic solution Place 1 drop into both eyes at bedtime.     LINZESS  145 MCG CAPS capsule TAKE 1 CAPSULE BY MOUTH EVERY MORNING BEFORE BREAKFAST 90 capsule 3   lisinopril  (ZESTRIL ) 5 MG tablet TAKE 1/2 TABLET BY MOUTH ONCE DAILY 90 tablet 0   meclizine (ANTIVERT) 25 MG tablet Take 50 mg by mouth 2 (two) times daily.     metFORMIN  (GLUCOPHAGE ) 1000 MG tablet TAKE ONE TABLET BY  MOUTH TWICE DAILY WITH MEALS 180 tablet 0   naproxen  (NAPROSYN ) 500 MG tablet Take 500 mg by mouth 2 (two) times daily.     omeprazole  (PRILOSEC) 40 MG capsule Take 1 capsule (40 mg total) by mouth daily. TAKE ONE (1) CAPSULE EACH DAY Strength: 40 mg 90 capsule 3   ondansetron  (ZOFRAN ) 4 MG tablet Take 1 tablet (4 mg total) by mouth every 8 (eight) hours as needed for nausea or vomiting. 20 tablet 0   pregabalin  (LYRICA ) 150 MG capsule Take 1 capsule (150 mg total) by mouth 2 (two) times daily. 60 capsule 2   TRULICITY 0.75 MG/0.5ML SOAJ Inject into the skin.     ALPRAZolam  (XANAX ) 1 MG tablet Take 1 tablet (1 mg total) by mouth at bedtime as needed for anxiety. 30 tablet 2   venlafaxine  XR (EFFEXOR -XR) 150 MG 24 hr capsule Take 2 capsules (300 mg total) by mouth at bedtime. 60 capsule 2   No current facility-administered medications for this visit.     Musculoskeletal: Strength & Muscle Tone: within normal limits Gait & Station: normal Patient leans: N/A  Psychiatric Specialty Exam: Review of Systems  Musculoskeletal:  Positive for arthralgias.    Blood pressure (!) 130/92, pulse 91, height 4\' 11"  (1.499 m), weight 121 lb 6.4 oz (55.1 kg), SpO2 99%.Body mass index is 24.52 kg/m.  General Appearance: Casual and Fairly Groomed  Eye Contact:  Good  Speech:  Clear and Coherent  Volume:  Normal  Mood:  Euthymic  Affect:  Congruent  Thought Process:  Goal Directed  Orientation:  Full (Time, Place, and Person)  Thought Content: WDL   Suicidal Thoughts:  No  Homicidal Thoughts:  No  Memory:  Immediate;   Good Recent;   Good Remote;  NA  Judgement:  Good  Insight:  Good  Psychomotor Activity:  Normal  Concentration:  Concentration: Good and Attention Span: Good  Recall:  Good  Fund of Knowledge: Good  Language: Good  Akathisia:  No  Handed:  Right  AIMS (if indicated): not done  Assets:  Communication Skills Desire for Improvement Physical Health Resilience Social  Support Talents/Skills  ADL's:  Intact  Cognition: WNL  Sleep:  Good   Screenings: GAD-7    Flowsheet Row Office Visit from 06/12/2023 in [facility] Office Visit from 03/24/2023 in [facility] Office Visit from 01/29/2023 in [facility] [place] Office Visit from 11/27/2022 in [facility] Office Visit from 10/29/2022 in [facility] [place]  Total GAD-7 Score 8 9 6 11 11       PHQ2-9    Flowsheet Row Office Visit from 06/12/2023 in [facility] Office Visit from 03/24/2023 in [facility] Office Visit from 01[facility]29/2023 in [facility] [place] Office Visit from 11/27/2022 in [facility] Office Visit from 10/29/2022 in [facility] Total Score 5 5 5 3 4   PHQ-9 Total Score 15 13 13 9 18       Flowsheet Row Office Visit from 08/06/2022 in [facility] [place] Video Visit from 02/06/2022 in [facility] [place] Counselor from 12/05/2021 in [facility] [place]  C-SSRS RISK CATEGORY No Risk No Risk Error: Q3, 4, or 5 should not be populated when Q2 is No        Assessment and Plan: This patient is a 63 year old female with a history of depression and anxiety.  She is doing well on her current regimen.  She will continue Effexor  XR 300 mg daily for depression and Xanax  1 mg bedtime as needed for anxiety or sleep.  She will return to see me in 3 months  Collaboration of Care: Collaboration of Care: Primary Care Provider AEB notes are shared with PCP on the epic system  Patient/Guardian was advised Release of Information must be obtained prior to any record  release in order to collaborate their care with an outside provider. Patient/Guardian was advised if they have not already done so to contact the registration department to sign all necessary forms in order for us  to release information regarding their care.   Consent: Patient/Guardian gives verbal consent for treatment and assignment of benefits for services provided during this visit. Patient/Guardian expressed understanding and agreed to proceed.    Alfredia Annas, MD 07/21/2023, 9:27 AM

## 2023-08-07 ENCOUNTER — Other Ambulatory Visit: Payer: Self-pay | Admitting: Family Medicine

## 2023-08-07 DIAGNOSIS — E1165 Type 2 diabetes mellitus with hyperglycemia: Secondary | ICD-10-CM

## 2023-08-08 DIAGNOSIS — E119 Type 2 diabetes mellitus without complications: Secondary | ICD-10-CM | POA: Diagnosis not present

## 2023-08-08 DIAGNOSIS — Z79899 Other long term (current) drug therapy: Secondary | ICD-10-CM | POA: Diagnosis not present

## 2023-08-08 DIAGNOSIS — M545 Low back pain, unspecified: Secondary | ICD-10-CM | POA: Diagnosis not present

## 2023-08-13 DIAGNOSIS — Z79899 Other long term (current) drug therapy: Secondary | ICD-10-CM | POA: Diagnosis not present

## 2023-09-03 ENCOUNTER — Telehealth (HOSPITAL_COMMUNITY): Payer: Self-pay

## 2023-09-03 NOTE — Telephone Encounter (Signed)
 Medication management - Prior authorization submitted online with CoverMyMeds and sent to patient's [employer] for review and pending decision.

## 2023-09-08 ENCOUNTER — Other Ambulatory Visit: Payer: Self-pay | Admitting: Family Medicine

## 2023-09-08 DIAGNOSIS — E119 Type 2 diabetes mellitus without complications: Secondary | ICD-10-CM | POA: Diagnosis not present

## 2023-09-08 DIAGNOSIS — R030 Elevated blood-pressure reading, without diagnosis of hypertension: Secondary | ICD-10-CM | POA: Diagnosis not present

## 2023-09-08 DIAGNOSIS — M546 Pain in thoracic spine: Secondary | ICD-10-CM | POA: Diagnosis not present

## 2023-09-08 DIAGNOSIS — K219 Gastro-esophageal reflux disease without esophagitis: Secondary | ICD-10-CM

## 2023-09-08 DIAGNOSIS — M542 Cervicalgia: Secondary | ICD-10-CM | POA: Diagnosis not present

## 2023-09-08 DIAGNOSIS — Z79899 Other long term (current) drug therapy: Secondary | ICD-10-CM | POA: Diagnosis not present

## 2023-09-08 DIAGNOSIS — M545 Low back pain, unspecified: Secondary | ICD-10-CM | POA: Diagnosis not present

## 2023-09-10 ENCOUNTER — Ambulatory Visit: Admitting: Orthopaedic Surgery

## 2023-09-10 DIAGNOSIS — Z79899 Other long term (current) drug therapy: Secondary | ICD-10-CM | POA: Diagnosis not present

## 2023-09-11 ENCOUNTER — Ambulatory Visit (INDEPENDENT_AMBULATORY_CARE_PROVIDER_SITE_OTHER): Payer: Medicaid Other | Admitting: Gastroenterology

## 2023-09-12 ENCOUNTER — Ambulatory Visit: Admitting: Family Medicine

## 2023-09-12 ENCOUNTER — Encounter: Payer: Self-pay | Admitting: Family Medicine

## 2023-09-12 VITALS — BP 125/72 | HR 96 | Temp 97.9°F | Ht 59.0 in | Wt 118.0 lb

## 2023-09-12 DIAGNOSIS — Z0000 Encounter for general adult medical examination without abnormal findings: Secondary | ICD-10-CM

## 2023-09-12 DIAGNOSIS — I152 Hypertension secondary to endocrine disorders: Secondary | ICD-10-CM | POA: Diagnosis not present

## 2023-09-12 DIAGNOSIS — E1159 Type 2 diabetes mellitus with other circulatory complications: Secondary | ICD-10-CM | POA: Diagnosis not present

## 2023-09-12 DIAGNOSIS — Z7984 Long term (current) use of oral hypoglycemic drugs: Secondary | ICD-10-CM

## 2023-09-12 DIAGNOSIS — K219 Gastro-esophageal reflux disease without esophagitis: Secondary | ICD-10-CM | POA: Diagnosis not present

## 2023-09-12 DIAGNOSIS — E1169 Type 2 diabetes mellitus with other specified complication: Secondary | ICD-10-CM

## 2023-09-12 DIAGNOSIS — Z0001 Encounter for general adult medical examination with abnormal findings: Secondary | ICD-10-CM | POA: Diagnosis not present

## 2023-09-12 DIAGNOSIS — G8929 Other chronic pain: Secondary | ICD-10-CM

## 2023-09-12 DIAGNOSIS — F321 Major depressive disorder, single episode, moderate: Secondary | ICD-10-CM

## 2023-09-12 DIAGNOSIS — M25512 Pain in left shoulder: Secondary | ICD-10-CM | POA: Diagnosis not present

## 2023-09-12 DIAGNOSIS — E785 Hyperlipidemia, unspecified: Secondary | ICD-10-CM | POA: Diagnosis not present

## 2023-09-12 DIAGNOSIS — J4489 Other specified chronic obstructive pulmonary disease: Secondary | ICD-10-CM | POA: Diagnosis not present

## 2023-09-12 DIAGNOSIS — F411 Generalized anxiety disorder: Secondary | ICD-10-CM | POA: Diagnosis not present

## 2023-09-12 DIAGNOSIS — E1142 Type 2 diabetes mellitus with diabetic polyneuropathy: Secondary | ICD-10-CM | POA: Diagnosis not present

## 2023-09-12 DIAGNOSIS — E1165 Type 2 diabetes mellitus with hyperglycemia: Secondary | ICD-10-CM | POA: Diagnosis not present

## 2023-09-12 LAB — BAYER DCA HB A1C WAIVED: HB A1C (BAYER DCA - WAIVED): 7.2 % — ABNORMAL HIGH (ref 4.8–5.6)

## 2023-09-12 LAB — LIPID PANEL

## 2023-09-12 MED ORDER — EMPAGLIFLOZIN 25 MG PO TABS: 25.0000 mg | ORAL_TABLET | Freq: Every day | ORAL | 3 refills | Status: AC

## 2023-09-12 MED ORDER — LISINOPRIL 5 MG PO TABS: 2.5000 mg | ORAL_TABLET | Freq: Every day | ORAL | 3 refills | Status: AC

## 2023-09-12 MED ORDER — METFORMIN HCL 1000 MG PO TABS: 1000.0000 mg | ORAL_TABLET | Freq: Two times a day (BID) | ORAL | 3 refills | Status: AC

## 2023-09-12 MED ORDER — BREZTRI AEROSPHERE 160-9-4.8 MCG/ACT IN AERO: 2.0000 | INHALATION_SPRAY | Freq: Two times a day (BID) | RESPIRATORY_TRACT | 11 refills | Status: DC

## 2023-09-12 MED ORDER — ATORVASTATIN CALCIUM 40 MG PO TABS: 40.0000 mg | ORAL_TABLET | Freq: Every day | ORAL | 3 refills | Status: AC

## 2023-09-12 MED ORDER — TRULICITY 1.5 MG/0.5ML ~~LOC~~ SOAJ: 1.5000 mg | SUBCUTANEOUS | 3 refills | Status: DC

## 2023-09-12 MED ORDER — OMEPRAZOLE 40 MG PO CPDR
40.0000 mg | DELAYED_RELEASE_CAPSULE | Freq: Every day | ORAL | 3 refills | Status: DC
Start: 2023-09-12 — End: 2023-11-07

## 2023-09-12 NOTE — Patient Instructions (Signed)

## 2023-09-12 NOTE — Progress Notes (Signed)
 Complete physical exam  Patient: Charlotte Perez   DOB: 1960/12/23   63 y.o. Female  MRN: [id number]  Subjective:    Chief Complaint  Patient presents with   Annual Exam    Charlotte Perez is a 63 y.o. female who presents today for a complete physical exam. She reports consuming a general diet. The patient does not participate in regular exercise at present. She generally feels fairly well. She reports sleeping poorly. She does not have additional problems to discuss today.   Has follow up with ortho for left shoulder pain. She has now had 3 injections. Last injection hasn't provided any relief. She does have a follow up with ortho next month.   Blood sugars have been 119-155 fasting.   COPD has been stable.   Continues to follow up with [facility].   GERD has been well controlled.   Most recent fall risk assessment:    09/12/2023   10:14 AM  Fall Risk   Falls in the past year? 0     Most recent depression screenings:    09/12/2023   10:14 AM 06/12/2023    9:46 AM 03/24/2023    9:42 AM  Depression screen PHQ 2/9  Decreased Interest 3 3 2   Down, Depressed, Hopeless 2 2 3   PHQ - 2 Score 5 5 5   Altered sleeping 3 2 2   Tired, decreased energy 3 2 2   Change in appetite 2 2 1   Feeling bad or failure about yourself  2 2 2   Trouble concentrating 3 2 1   Moving slowly or fidgety/restless 1 0 0  Suicidal thoughts 0 0 0  PHQ-9 Score 19 15 13   Difficult doing work/chores Somewhat difficult Somewhat difficult Somewhat difficult      09/12/2023   10:15 AM 06/12/2023    9:47 AM 03/24/2023    9:42 AM 01/29/2023    9:25 AM  GAD 7 : Generalized Anxiety Score  Nervous, Anxious, on Edge 3 2 1    Control/stop worrying 2 2 2    Worry too much - different things 1 1 2    Trouble relaxing 3 2 3    Restless 0 1 1   Easily annoyed or irritable 1 0 0   Afraid - awful might happen 0 0 0   Total GAD 7 Score 10 8 9    Anxiety Difficulty Somewhat difficult Somewhat difficult Somewhat difficult       Information is confidential and restricted. Go to Review Flowsheets to unlock data.      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   DDD (degenerative disc disease), cervical    DDD (degenerative disc disease), lumbar    Depression    Diabetes mellitus (HCC) 06/27/2010   Diabetes mellitus, type II (HCC)    Gallbladder sludge    GERD (gastroesophageal reflux disease)    Hyperlipidemia    Hypertension    Iron deficiency anemia 09/05/2016   Lumbar radiculopathy    Mole (skin)    Neuropathy of foot    Renal cyst    Shingles    Skin lesions, generalized  1.2 CM FLAT FAWN COLOR AT THE T10 AREA JUST RIGHT OF SPINE       Patient Care Team: Albertha Huger, FNP as PCP - General (Family Medicine)   Outpatient Medications Prior to Visit  Medication Sig   ACCU-CHEK FASTCLIX LANCETS MISC EVERY DAY   albuterol  (PROVENTIL ) (2.5 MG/3ML) 0.083% nebulizer solution Take 3 mLs (2.5 mg total) by nebulization every 6 (six) hours as needed for wheezing or shortness of breath.   albuterol  (VENTOLIN  HFA) 108 (90 Base) MCG/ACT inhaler Inhale 2 puffs into the lungs every 6 (six) hours as needed for wheezing or shortness of breath.   ALPRAZolam  (XANAX ) 1 MG tablet Take 1 tablet (1 mg total) by mouth at bedtime as needed for anxiety.   atorvastatin  (LIPITOR) 40 MG tablet TAKE ONE (1) TABLET BY MOUTH EVERY DAY   Blood Glucose Monitoring Suppl (ACCU-CHEK GUIDE ME) w/Device KIT Use daily Dx E11.9   Boric Acid Vaginal ([employer] BORIC ACID) 600 MG SUPP Place 1 suppository vaginally at bedtime.   Budeson-Glycopyrrol-Formoterol (BREZTRI   AEROSPHERE) 160-9-4.8 MCG/ACT AERO Inhale 2 puffs into the lungs 2 (two) times daily.   clobetasol  ointment (TEMOVATE ) 0.05 % Pea-sized amount to affected area as needed- at most nightly   cycloSPORINE (RESTASIS) 0.05 % ophthalmic emulsion Place 1 drop into both eyes at bedtime.   dicyclomine  (BENTYL ) 10 MG capsule Take 1 capsule (10 mg total) by mouth 2 times daily at 12 noon and 4 pm.   Dulaglutide  (TRULICITY ) 1.5 MG/0.5ML SOAJ Inject 1.5 mg into the skin once a week.   empagliflozin  (JARDIANCE ) 25 MG TABS tablet Take 1 tablet (25 mg total) by mouth daily.   glucose blood (ACCU-CHEK GUIDE TEST) test strip Use daily Dx E11.9   HYDROcodone -acetaminophen  (NORCO) 10-325 MG tablet Take 1 tablet by mouth 3 (three) times daily as needed for pain.   latanoprost (XALATAN) 0.005 % ophthalmic solution Place 1 drop into both eyes at bedtime.   LINZESS  145 MCG CAPS capsule TAKE 1 CAPSULE BY MOUTH EVERY MORNING BEFORE BREAKFAST   lisinopril  (ZESTRIL ) 5 MG tablet TAKE 1/2 TABLET BY MOUTH ONCE DAILY   meclizine (ANTIVERT) 25 MG tablet Take 50 mg by mouth 2 (two) times daily.   metFORMIN  (GLUCOPHAGE ) 1000 MG tablet TAKE ONE TABLET BY MOUTH TWICE DAILY WITH MEALS   naproxen  (NAPROSYN ) 500 MG tablet Take 500 mg by mouth 2 (two) times daily.   omeprazole  (PRILOSEC) 40 MG capsule TAKE ONE CAPSULE BY MOUTH DAILY   ondansetron  (ZOFRAN ) 4 MG tablet Take 1 tablet (4 mg total) by mouth every 8 (eight) hours as needed for nausea or vomiting.   pregabalin  (LYRICA ) 150 MG capsule Take 1 capsule (150 mg total) by mouth 2 (two) times daily.   TRULICITY  0.75 MG/0.5ML SOAJ Inject into the skin.   venlafaxine  XR (EFFEXOR -XR) 150 MG 24 hr capsule Take 2 capsules (300 mg total) by mouth at bedtime.   No facility-administered medications prior to visit.    ROS Negative unless specially indicated above in HPI.     Objective:     BP 125/72   Pulse 96   Temp 97.9 F (36.6 C) (Temporal)   Ht 4' 11 (1.499 m)   Wt 118 lb  (53.5 kg)   SpO2 97%   BMI 23.83 kg/m    Physical Exam Vitals and nursing note reviewed.  Constitutional:      General: She is not in acute distress.    Appearance: Normal appearance. She is not ill-appearing, toxic-appearing or  diaphoretic.  HENT:     Head: Normocephalic.     Right Ear: Tympanic membrane, ear canal and external ear normal.     Left Ear: Tympanic membrane, ear canal and external ear normal.     Nose: Nose normal.     Mouth/Throat:     Mouth: Mucous membranes are moist.     Pharynx: Oropharynx is clear.   Eyes:     Extraocular Movements: Extraocular movements intact.     Conjunctiva/sclera: Conjunctivae normal.     Pupils: Pupils are equal, round, and reactive to light.    Cardiovascular:     Rate and Rhythm: Normal rate and regular rhythm.     Pulses: Normal pulses.     Heart sounds: Normal heart sounds. No murmur heard.    No friction rub. No gallop.  Pulmonary:     Effort: Pulmonary effort is normal.     Breath sounds: Normal breath sounds.  Abdominal:     General: Bowel sounds are normal. There is no distension.     Palpations: Abdomen is soft. There is no mass.     Tenderness: There is no abdominal tenderness. There is no guarding.   Musculoskeletal:     Left shoulder: Tenderness present. No swelling or effusion. Decreased strength.     Cervical back: Normal range of motion and neck supple. No tenderness.     Right lower leg: No edema.     Left lower leg: No edema.   Skin:    General: Skin is warm and dry.     Capillary Refill: Capillary refill takes less than 2 seconds.     Findings: No lesion or rash.   Neurological:     General: No focal deficit present.     Mental Status: She is alert and oriented to person, place, and time.     Cranial Nerves: No cranial nerve deficit.     Motor: No weakness.     Gait: Gait normal.   Psychiatric:        Mood and Affect: Mood normal.        Behavior: Behavior normal.        Thought Content: Thought  content normal.        Judgment: Judgment normal.      No results found for any visits on 09/12/23.     Assessment & Plan:    Routine Health Maintenance and Physical Exam  Aleni was seen today for annual exam.  Diagnoses and all orders for this visit:  Routine general medical examination at a health care facility  Type 2 diabetes mellitus with diabetic polyneuropathy, without long-term current use of insulin (HCC) A1c 7.2 today, not at goal of <7. Increase trulicity  to 1.5 mg weekly. On statin and ACE. Diet and exercise.  -     Bayer DCA Hb A1c Waived -     empagliflozin  (JARDIANCE ) 25 MG TABS tablet; Take 1 tablet (25 mg total) by mouth daily. -     lisinopril  (ZESTRIL ) 5 MG tablet; Take 0.5 tablets (2.5 mg total) by mouth daily. -     metFORMIN  (GLUCOPHAGE ) 1000 MG tablet; Take 1 tablet (1,000 mg total) by mouth 2 (two) times daily with a meal. -     Dulaglutide  (TRULICITY ) 1.5 MG/0.5ML SOAJ; Inject 1.5 mg into the skin once a week.  Hyperlipidemia associated with type 2 diabetes mellitus (HCC) Fasting panel pending. On statin.  -     Lipid panel -     atorvastatin  (LIPITOR)  40 MG tablet; Take 1 tablet (40 mg total) by mouth daily.  Hypertension associated with diabetes (HCC) Well controlled on current regimen.  -     CBC with Differential/Platelet -     CMP14+EGFR -     TSH  COPD with asthma (HCC) Stable.  -     budesonide-glycopyrrolate-formoterol (BREZTRI  AEROSPHERE) 160-9-4.8 MCG/ACT AERO inhaler; Inhale 2 puffs into the lungs 2 (two) times daily.  Gastroesophageal reflux disease without esophagitis Well controlled on current regimen.  -     omeprazole  (PRILOSEC) 40 MG capsule; Take 1 capsule (40 mg total) by mouth daily.  GAD (generalized anxiety disorder) Moderate single current episode of major depressive disorder (HCC) Uncontrolled. Denies SI. Follow up with [facility].   Chronic pain in left shoulder Follow up with ortho.    Immunization History   Administered Date(s) Administered   Influenza Split 05/01/2006, 03/05/2007   Influenza, Seasonal, Injecte, Preservative Fre 12/29/2008, 02/08/2011, 02/17/2012, 03/06/2023   Influenza,inj,Quad PF,6+ Mos 04/14/2013, 01/03/2014, 01/06/2015, 02/10/2016, 01/06/2017, 01/12/2018, 02/07/2022   Influenza-Unspecified 01/09/2020   Moderna Sars-Covid-2 Vaccination 06/10/2019, 06/29/2019   Pneumococcal Polysaccharide-23 01/14/2019   Td 08/31/2010, 08/15/2016   Td (Adult),5 Lf Tetanus Toxid, Preservative Free 09/02/1999   Tdap 08/31/2010   Zoster Recombinant(Shingrix ) 05/15/2022, 08/26/2022    Health Maintenance  Topic Date Due   COVID-19 Vaccine (3 - Moderna risk series) 03/21/2024 (Originally 07/27/2019)   Pneumococcal Vaccine 57-15 Years old (2 of 2 - PCV) 06/11/2024 (Originally 01/14/2020)   INFLUENZA VACCINE  10/24/2023   OPHTHALMOLOGY EXAM  11/29/2023   HEMOGLOBIN A1C  12/13/2023   Diabetic kidney evaluation - Urine ACR  03/05/2024   FOOT EXAM  03/05/2024   Diabetic kidney evaluation - eGFR measurement  04/30/2024   MAMMOGRAM  09/18/2024   Colonoscopy  07/20/2025   DTaP/Tdap/Td (4 - Td or Tdap) 08/16/2026   Hepatitis C Screening  Completed   HIV Screening  Completed   Zoster Vaccines- Shingrix   Completed   HPV VACCINES  Aged Out   Meningococcal B Vaccine  Aged Out    Discussed health benefits of physical activity, and encouraged her to engage in regular exercise appropriate for her age and condition.  Problem List Items Addressed This Visit       Cardiovascular and Mediastinum   Hypertension associated with diabetes (HCC) (Chronic)   Relevant Medications   atorvastatin  (LIPITOR) 40 MG tablet   empagliflozin  (JARDIANCE ) 25 MG TABS tablet   lisinopril  (ZESTRIL ) 5 MG tablet   metFORMIN  (GLUCOPHAGE ) 1000 MG tablet   Dulaglutide  (TRULICITY ) 1.5 MG/0.5ML SOAJ   Other Relevant Orders   CBC with Differential/Platelet   CMP14+EGFR   TSH     Respiratory   COPD with asthma (HCC)  (Chronic)   Relevant Medications   budesonide-glycopyrrolate-formoterol (BREZTRI  AEROSPHERE) 160-9-4.8 MCG/ACT AERO inhaler     Digestive   GERD (gastroesophageal reflux disease)   Relevant Medications   omeprazole  (PRILOSEC) 40 MG capsule     Endocrine   Hyperlipidemia associated with type 2 diabetes mellitus (HCC) (Chronic)   Relevant Medications   atorvastatin  (LIPITOR) 40 MG tablet   empagliflozin  (JARDIANCE ) 25 MG TABS tablet   lisinopril  (ZESTRIL ) 5 MG tablet   metFORMIN  (GLUCOPHAGE ) 1000 MG tablet   Dulaglutide  (TRULICITY ) 1.5 MG/0.5ML SOAJ   Other Relevant Orders   Lipid panel   Type 2 diabetes mellitus with diabetic polyneuropathy, without long-term current use of insulin (HCC)   Relevant Medications   atorvastatin  (LIPITOR) 40 MG tablet   empagliflozin  (JARDIANCE ) 25 MG TABS  tablet   lisinopril  (ZESTRIL ) 5 MG tablet   metFORMIN  (GLUCOPHAGE ) 1000 MG tablet   Dulaglutide  (TRULICITY ) 1.5 MG/0.5ML SOAJ   Other Relevant Orders   Bayer DCA Hb A1c Waived     Other   Moderate single current episode of major depressive disorder (HCC) (Chronic)   GAD (generalized anxiety disorder)   Chronic pain in left shoulder   Other Visit Diagnoses       Routine general medical examination at a health care facility    -  Primary      Return in about 3 months (around 12/13/2023) for chronic follow up.   The patient indicates understanding of these issues and agrees with the plan.  Albertha Huger, FNP

## 2023-09-13 LAB — CBC WITH DIFFERENTIAL/PLATELET
Basophils Absolute: 0.1 10*3/uL (ref 0.0–0.2)
Basos: 1 %
EOS (ABSOLUTE): 0.3 10*3/uL (ref 0.0–0.4)
Eos: 4 %
Hematocrit: 42.7 % (ref 34.0–46.6)
Hemoglobin: 13.3 g/dL (ref 11.1–15.9)
Immature Grans (Abs): 0 10*3/uL (ref 0.0–0.1)
Immature Granulocytes: 0 %
Lymphocytes Absolute: 1.4 10*3/uL (ref 0.7–3.1)
Lymphs: 22 %
MCH: 29.9 pg (ref 26.6–33.0)
MCHC: 31.1 g/dL — ABNORMAL LOW (ref 31.5–35.7)
MCV: 96 fL (ref 79–97)
Monocytes Absolute: 0.5 10*3/uL (ref 0.1–0.9)
Monocytes: 8 %
Neutrophils Absolute: 4 10*3/uL (ref 1.4–7.0)
Neutrophils: 65 %
Platelets: 244 10*3/uL (ref 150–450)
RBC: 4.45 x10E6/uL (ref 3.77–5.28)
RDW: 13 % (ref 11.7–15.4)
WBC: 6.3 10*3/uL (ref 3.4–10.8)

## 2023-09-13 LAB — LIPID PANEL
Chol/HDL Ratio: 2.4 ratio (ref 0.0–4.4)
Cholesterol, Total: 168 mg/dL (ref 100–199)
HDL: 70 mg/dL (ref >39)
LDL Chol Calc (NIH): 78 mg/dL (ref 0–99)
Triglycerides: 112 mg/dL (ref 0–149)
VLDL Cholesterol Cal: 20 mg/dL (ref 5–40)

## 2023-09-13 LAB — CMP14+EGFR
ALT: 10 IU/L (ref 0–32)
AST: 14 IU/L (ref 0–40)
Albumin: 4.5 g/dL (ref 3.9–4.9)
Alkaline Phosphatase: 64 IU/L (ref 44–121)
BUN/Creatinine Ratio: 26 (ref 12–28)
BUN: 16 mg/dL (ref 8–27)
Bilirubin Total: 0.2 mg/dL (ref 0.0–1.2)
CO2: 24 mmol/L (ref 20–29)
Calcium: 10.3 mg/dL (ref 8.7–10.3)
Chloride: 99 mmol/L (ref 96–106)
Creatinine, Ser: 0.62 mg/dL (ref 0.57–1.00)
Globulin, Total: 2.4 g/dL (ref 1.5–4.5)
Glucose: 119 mg/dL — ABNORMAL HIGH (ref 70–99)
Potassium: 4.8 mmol/L (ref 3.5–5.2)
Sodium: 138 mmol/L (ref 134–144)
Total Protein: 6.9 g/dL (ref 6.0–8.5)
eGFR: 100 mL/min/{1.73_m2} (ref >59)

## 2023-09-13 LAB — TSH: TSH: 0.706 u[IU]/mL (ref 0.450–4.500)

## 2023-09-15 ENCOUNTER — Ambulatory Visit: Payer: Self-pay | Admitting: Family Medicine

## 2023-09-17 ENCOUNTER — Ambulatory Visit (INDEPENDENT_AMBULATORY_CARE_PROVIDER_SITE_OTHER): Admitting: Gastroenterology

## 2023-09-17 ENCOUNTER — Encounter (INDEPENDENT_AMBULATORY_CARE_PROVIDER_SITE_OTHER): Payer: Self-pay | Admitting: Gastroenterology

## 2023-09-17 VITALS — BP 124/82 | HR 98 | Temp 97.3°F | Ht 59.0 in | Wt 116.6 lb

## 2023-09-17 DIAGNOSIS — R12 Heartburn: Secondary | ICD-10-CM | POA: Diagnosis not present

## 2023-09-17 DIAGNOSIS — K219 Gastro-esophageal reflux disease without esophagitis: Secondary | ICD-10-CM

## 2023-09-17 DIAGNOSIS — K581 Irritable bowel syndrome with constipation: Secondary | ICD-10-CM | POA: Diagnosis not present

## 2023-09-17 MED ORDER — LINACLOTIDE 290 MCG PO CAPS: 290.0000 ug | ORAL_CAPSULE | Freq: Every day | ORAL | 3 refills | Status: AC

## 2023-09-17 NOTE — Patient Instructions (Signed)
 Continue omeprazole  40 mg daily Increased Linzess  to 290 mcg/day, can take 2 pills of the 145 mcg every day until you run out of your prescription

## 2023-09-17 NOTE — Progress Notes (Signed)
 Toribio Fortune, M.D. Gastroenterology & Hepatology [facility] [address] [place], [place] [address]  Primary Care Physician: Joesph Annabella HERO, FNP [address] [place] [address]  I will communicate my assessment and recommendations to the referring MD via EMR.  Problems: IBS-C Chronic abdominal pain  History of Present Illness: Charlotte Perez is a 63 y.o. female with past medical history of asthma, ADD, bipolar disorder, COPD, diabetes, depression, IBS-C, chronic opiate use  who presents for follow up of IBS and abdominal pain.  The patient was last seen on 09/10/2022. At that time, the patient was advised to continue Linzess  145 mcg/day, omeprazole  40 mg daily and Bentyl  as needed.  CT abdomen pelvis with contrast was performed on 09/11/2022 which was normal.  Patient reports that she is having a BM every 3 days sometimes, but other times may have a BM every week. Has been taking Linzess  145 mcg qday. She reports having pain in her epigastric area, similar to her chronic pain. Has had some mild nausea intermittently since yesterday, which she attributes to the heat wave.  Takes Norco 2 pills twice a day PRN.  The patient denies having any fever, chills, hematochezia, melena, hematemesis, diarrhea, jaundice, pruritus. Has been losing weight while on Jardiance .  Last Colonoscopy: 07/21/2015, performed at [facility] by Dr. Donnel.  Found to have small external and internal hemorrhoids, mild diverticulosis in the sigmoid colon.  Recommended to repeat in 10 years. Last Endoscopy:03/07/21 which showed normal esophagus, stomach and duodenum.  Biopsies from small bowel were within normal limits.  Gastric biopsies showed presence of gastrointestinal metaplasia and chronic atrophy gastritis but negative for H. pylori.  Recommended to have a repeat EGD in 3 years for gastric mapping.    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   DDD (degenerative disc disease), cervical    DDD (degenerative disc disease), lumbar    Depression    Diabetes mellitus (HCC) 06/27/2010   Diabetes mellitus, type II (HCC)    Gallbladder sludge    GERD (gastroesophageal reflux disease)    Hyperlipidemia    Hypertension    Iron deficiency anemia 09/05/2016   Lumbar radiculopathy    Mole (skin)    Neuropathy of foot    Renal cyst    Shingles    Skin lesions, generalized    1.2 CM FLAT FAWN COLOR AT THE T10 AREA JUST RIGHT OF SPINE     Past Surgical History: Past Surgical History:  Procedure Laterality Date   ABDOMINAL HYSTERECTOMY     total hysterectomy   BACK SURGERY     BIOPSY  03/07/2021   Procedure: BIOPSY;  Surgeon: Fortune Angelia Toribio, MD;  Location: [facility];  Service: Gastroenterology;;   CESAREAN SECTION     x 2   ESOPHAGOGASTRODUODENOSCOPY (EGD) WITH PROPOFOL  N/A 03/07/2021   Procedure: ESOPHAGOGASTRODUODENOSCOPY (EGD) WITH PROPOFOL ;  Surgeon: Fortune Angelia Toribio, MD;  Location: [facility];  Service: Gastroenterology;  Laterality: N/A;  9:15   NECK SURGERY      Family History: Family History  Problem Relation Age of Onset   ADD / ADHD Other    COPD Father    Alcohol abuse Father    Depression Daughter    Alcohol abuse Mother    COPD Mother        on oxygen   Colon cancer Paternal Grandfather    Heart disease Sister    Heart disease Brother    Arthritis Sister        knee replacement   Diabetes Maternal Grandmother    Diabetes Sister     Social History: Social History   Tobacco Use  Smoking Status Never   Passive  exposure: Yes  Smokeless Tobacco Never   Social History   Substance and Sexual Activity  Alcohol Use No   Social History   Substance and Sexual Activity  Drug Use No    Allergies: Allergies  Allergen Reactions   Cymbalta  [Duloxetine  Hcl]     Per pt it started making her mouth tingle and could not taste anything   Gabapentin     Itching, fidgety   Meloxicam  Other (See Comments)    Tongue tingling, and lost sense of taste.   Tramadol  Other (See Comments)    Tongue tingling, and lost sense of taste.    Medications: Current Outpatient Medications  Medication Sig Dispense Refill   ACCU-CHEK FASTCLIX LANCETS MISC EVERY DAY 102 each 2   albuterol  (PROVENTIL ) (2.5 MG/3ML) 0.083% nebulizer solution Take 3 mLs (2.5 mg total) by nebulization every 6 (six) hours as needed for wheezing or shortness of breath. 75 mL 3   albuterol  (VENTOLIN  HFA) 108 (90 Base) MCG/ACT inhaler Inhale 2 puffs into the lungs every 6 (six) hours as needed for wheezing or shortness of breath. 8 g 0   ALPRAZolam  (XANAX ) 1 MG tablet Take 1 tablet (1 mg total) by mouth at bedtime as needed for anxiety. 30 tablet 2   atorvastatin  (LIPITOR) 40 MG tablet Take 1 tablet (40 mg total) by mouth daily. 90 tablet 3   Blood Glucose Monitoring Suppl (ACCU-CHEK GUIDE ME) w/Device KIT Use daily Dx E11.9 1 kit 0   budesonide-glycopyrrolate-formoterol (BREZTRI  AEROSPHERE) 160-9-4.8 MCG/ACT AERO inhaler Inhale 2 puffs into the lungs 2 (two) times daily. 10.7 g 11   clobetasol  ointment (TEMOVATE ) 0.05 % Pea-sized amount to affected area as needed- at most nightly 30 g 1   cycloSPORINE (RESTASIS) 0.05 % ophthalmic emulsion Place 1 drop into both eyes at bedtime.     dicyclomine  (BENTYL ) 10 MG capsule Take 1 capsule (10 mg total) by mouth 2 times daily at 12 noon and 4 pm. 60 capsule 2   Dulaglutide  (TRULICITY ) 1.5 MG/0.5ML SOAJ Inject 1.5 mg into the skin once a week. 6 mL 3   empagliflozin  (JARDIANCE ) 25 MG TABS tablet Take 1 tablet  (25 mg total) by mouth daily. 90 tablet 3   glucose blood (ACCU-CHEK GUIDE TEST) test strip Use daily Dx E11.9 100 each 3   HYDROcodone -acetaminophen  (NORCO) 10-325 MG tablet Take 1 tablet by mouth 3 (three) times daily as needed for pain.     LINZESS  145 MCG CAPS capsule TAKE 1 CAPSULE BY MOUTH EVERY MORNING BEFORE BREAKFAST 90 capsule 3   lisinopril  (ZESTRIL ) 5 MG tablet Take 0.5 tablets (2.5 mg total) by mouth daily. 90 tablet 3   meclizine (ANTIVERT) 25 MG tablet Take 50 mg by mouth 2 (two) times daily.     metFORMIN  (GLUCOPHAGE ) 1000 MG tablet Take 1 tablet (1,000 mg total) by mouth 2 (two) times  daily with a meal. 180 tablet 3   naproxen  (NAPROSYN ) 500 MG tablet Take 500 mg by mouth 2 (two) times daily.     omeprazole  (PRILOSEC) 40 MG capsule Take 1 capsule (40 mg total) by mouth daily. 90 capsule 3   ondansetron  (ZOFRAN ) 4 MG tablet Take 1 tablet (4 mg total) by mouth every 8 (eight) hours as needed for nausea or vomiting. 20 tablet 0   venlafaxine  XR (EFFEXOR -XR) 150 MG 24 hr capsule Take 2 capsules (300 mg total) by mouth at bedtime. 60 capsule 2   Boric Acid Vaginal ([employer]S BORIC ACID) 600 MG SUPP Place 1 suppository vaginally at bedtime. (Patient not taking: Reported on 09/17/2023) 7 suppository 3   latanoprost (XALATAN) 0.005 % ophthalmic solution Place 1 drop into both eyes at bedtime. (Patient not taking: Reported on 09/17/2023)     pregabalin  (LYRICA ) 150 MG capsule Take 1 capsule (150 mg total) by mouth 2 (two) times daily. (Patient not taking: Reported on 09/17/2023) 60 capsule 2   No current facility-administered medications for this visit.    Review of Systems: GENERAL: negative for malaise, night sweats HEENT: No changes in hearing or vision, no nose bleeds or other nasal problems. NECK: Negative for lumps, goiter, pain and significant neck swelling RESPIRATORY: Negative for cough, wheezing CARDIOVASCULAR: Negative for chest pain, leg swelling, palpitations, orthopnea GI: SEE  HPI MUSCULOSKELETAL: Negative for joint pain or swelling, back pain, and muscle pain. SKIN: Negative for lesions, rash PSYCH: Negative for sleep disturbance, mood disorder and recent psychosocial stressors. HEMATOLOGY Negative for prolonged bleeding, bruising easily, and swollen nodes. ENDOCRINE: Negative for cold or heat intolerance, polyuria, polydipsia and goiter. NEURO: negative for tremor, gait imbalance, syncope and seizures. The remainder of the review of systems is noncontributory.   Physical Exam: BP 124/82 (BP Location: Left Arm, Patient Position: Sitting, Cuff Size: Normal)   Pulse 98   Temp (!) 97.3 F (36.3 C) (Temporal)   Ht 4' 11 (1.499 m)   Wt 116 lb 9.6 oz (52.9 kg)   BMI 23.55 kg/m  GENERAL: The patient is AO x3, in no acute distress. HEENT: Head is normocephalic and atraumatic. EOMI are intact. Mouth is well hydrated and without lesions. NECK: Supple. No masses LUNGS: Clear to auscultation. No presence of rhonchi/wheezing/rales. Adequate chest expansion HEART: RRR, normal s1 and s2. ABDOMEN: tender in the upper abdominal area and periumbilical area, no guarding, no peritoneal signs, and nondistended. BS +. No masses. EXTREMITIES: Without any cyanosis, clubbing, rash, lesions or edema. NEUROLOGIC: AOx3, no focal motor deficit. SKIN: no jaundice, no rashes  Imaging/Labs: as above  I personally reviewed and interpreted the available labs, imaging and endoscopic files.  Impression and Plan: Charlotte Perez is a 63 y.o. female with past medical history of asthma, ADD, bipolar disorder, COPD, diabetes, depression, IBS-C, chronic opiate use  who presents for follow up of IBS and abdominal pain. Patient reports that she has presented some improvement with Linzess , still presenting some constipation. I suspect a higher dosage of Linzess  may be beneficial, especially given the presence of abdominal pain.  She may agreement to try a higher dose.    Heartburn seems to be  well-controlled on PPI, will continue same dosage for now.  Given heat wave, also advised to keep hydrated vigorously.  -Continue omeprazole  40 mg daily -Increase Linzess  to 290 mcg/day  All questions were answered.      Toribio Fortune, MD Gastroenterology and Hepatology [facility]

## 2023-09-28 DIAGNOSIS — G8929 Other chronic pain: Secondary | ICD-10-CM | POA: Diagnosis not present

## 2023-09-28 DIAGNOSIS — M25512 Pain in left shoulder: Secondary | ICD-10-CM | POA: Diagnosis not present

## 2023-09-29 ENCOUNTER — Telehealth: Payer: Self-pay | Admitting: Orthopaedic Surgery

## 2023-09-29 NOTE — Telephone Encounter (Signed)
 Tried to return the pt's call, unable to lvm.  She is wanting to know her next appt, she doesn't have one, but needs one.  Last AVS said Return in about 6 weeks (around 08/27/2023) for Inject left shoulder

## 2023-10-01 ENCOUNTER — Ambulatory Visit: Admitting: Orthopaedic Surgery

## 2023-10-01 ENCOUNTER — Encounter: Payer: Self-pay | Admitting: Orthopaedic Surgery

## 2023-10-01 VITALS — BP 118/79 | HR 79 | Ht <= 58 in | Wt 118.0 lb

## 2023-10-01 DIAGNOSIS — M25512 Pain in left shoulder: Secondary | ICD-10-CM

## 2023-10-01 DIAGNOSIS — G8929 Other chronic pain: Secondary | ICD-10-CM

## 2023-10-01 MED ORDER — METHYLPREDNISOLONE ACETATE 40 MG/ML IJ SUSP
40.0000 mg | Freq: Once | INTRAMUSCULAR | Status: AC
  Administered 2023-10-01: 40 mg via INTRA_ARTICULAR

## 2023-10-01 NOTE — Addendum Note (Signed)
 Addended by: VICENTA EMMIE HERO on: 10/01/2023 10:44 AM   Modules accepted: Orders

## 2023-10-01 NOTE — Progress Notes (Signed)
 My shoulder hurts.  She went to the ER at [facility] on 09-28-23 for the left shoulder.  X-rays were done and were negative.  She was given naprosyn .  I have reviewed the notes and the xray report.  She missed her appointment here to see me 08-27-23 for the shoulder.  Prior MRI done in April showed: IMPRESSION: 1. Moderate supraspinatus tendinosis with irregularity along the articular surface. 2. Moderate tendinosis of the intra-articular portion of the long head of the biceps tendon with a partial-thickness tear.  She says the shoulder hurts too much and she wants something done for it.  She receives pain medicine from another provider.  ROM of the left shoulder is decreased.  She is emotional today and prefers not to move it much.  NV intact.  She has no swelling, no redness.  Encounter Diagnosis  Name Primary?   Chronic pain in left shoulder Yes   PROCEDURE NOTE:  The patient request injection, verbal consent was obtained.  The left shoulder was prepped appropriately after time out was performed.   Sterile technique was observed and injection of 1 cc of DepoMedrol 40mg  with several cc's of plain xylocaine . Anesthesia was provided by ethyl chloride and a 20-gauge needle was used to inject the shoulder area. A posterior approach was used.  The injection was tolerated well.  A band aid dressing was applied.  The patient was advised to apply ice later today and tomorrow to the injection sight as needed.  I will have her see Dr. Onesimo for his opinion on her shoulder.  Call if any problem.  Precautions discussed.  Electronically Signed Lemond Stable, MD 7/9/20258:28 AM

## 2023-10-01 NOTE — Patient Instructions (Signed)
 Follow up with Dr. Leona to discuss shoulder MRI results.

## 2023-10-02 ENCOUNTER — Telehealth (HOSPITAL_COMMUNITY): Payer: Self-pay

## 2023-10-02 NOTE — Telephone Encounter (Signed)
 Prior authorization for pt's Venlafaxine  HCI ER 150 MG er Capsules approved from 10/02/23 to 10/01/24

## 2023-10-08 ENCOUNTER — Other Ambulatory Visit: Payer: Self-pay | Admitting: *Deleted

## 2023-10-08 DIAGNOSIS — M545 Low back pain, unspecified: Secondary | ICD-10-CM | POA: Diagnosis not present

## 2023-10-08 DIAGNOSIS — Z79899 Other long term (current) drug therapy: Secondary | ICD-10-CM | POA: Diagnosis not present

## 2023-10-08 DIAGNOSIS — E119 Type 2 diabetes mellitus without complications: Secondary | ICD-10-CM | POA: Diagnosis not present

## 2023-10-08 DIAGNOSIS — L309 Dermatitis, unspecified: Secondary | ICD-10-CM

## 2023-10-08 DIAGNOSIS — R0602 Shortness of breath: Secondary | ICD-10-CM | POA: Diagnosis not present

## 2023-10-08 DIAGNOSIS — R030 Elevated blood-pressure reading, without diagnosis of hypertension: Secondary | ICD-10-CM | POA: Diagnosis not present

## 2023-10-09 MED ORDER — CLOBETASOL PROPIONATE 0.05 % EX OINT
TOPICAL_OINTMENT | CUTANEOUS | 1 refills | Status: AC
Start: 2023-10-09 — End: ?

## 2023-10-14 DIAGNOSIS — Z79899 Other long term (current) drug therapy: Secondary | ICD-10-CM | POA: Diagnosis not present

## 2023-10-17 ENCOUNTER — Ambulatory Visit: Admitting: Orthopedic Surgery

## 2023-10-17 ENCOUNTER — Encounter: Payer: Self-pay | Admitting: Orthopedic Surgery

## 2023-10-17 DIAGNOSIS — M25512 Pain in left shoulder: Secondary | ICD-10-CM | POA: Diagnosis not present

## 2023-10-17 DIAGNOSIS — G8929 Other chronic pain: Secondary | ICD-10-CM | POA: Diagnosis not present

## 2023-10-17 NOTE — Patient Instructions (Signed)
 We have completed a prescription today to get an updated TENS unit for your left shoulder.  Use this device as recommended  If you continue to have issues with your shoulder, we can consider different types of injections.  We can also consider formal physical therapy.  Please contact the clinic if you have any further issues.

## 2023-10-17 NOTE — Progress Notes (Signed)
 New Patient Visit  Assessment: Charlotte Perez is a 63 y.o. female with the following: 1. Chronic pain in left shoulder  Plan: Charlotte Perez has pain in the left shoulder.  No specific injury.  Pain is primarily over the scapula, with some radiating pains distally.  She also has some radiating pains into her chest.  MRI with moderate tendinosis of the supraspinatus and long head of the biceps tendon.  There is a partial injury to the biceps tendon.  She is really not interested in considering surgery at this time.  I am not convinced that there is a role for surgery based on her current presentation.  We discussed multiple treatment options, and she is interested in trying a TENS unit, as she has had success using this on her back previously.  She is requesting a new unit.  In addition, we can also consider ultrasound-guided injections, as well as formal physical therapy.  Follow-up: Return if symptoms worsen or fail to improve.  Subjective:  Chief Complaint  Patient presents with   Shoulder Pain    L here to discuss possible surgery.    History of Present Illness: Charlotte Perez is a 63 y.o. female who presents for evaluation of left shoulder pain.  She has had pain in the left shoulder for several months.  She has previously been evaluated by Dr. Brenna.  She had an injection 2 weeks ago.  This improved her symptoms, although she still has pain.  Primary location of the pain is in the posterior aspect of the shoulder, primarily over the scapula.  No specific injury.  She notes pain will radiate distally.  She also has some pain radiating onto her chest.  She has tried heating pad, as well as naproxen  she will occasionally take hydrocodone  which she has available due to low back pain.   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   DDD (degenerative disc disease), cervical    DDD (degenerative disc disease), lumbar    Depression    Diabetes mellitus (HCC) 06/27/2010   Diabetes mellitus, type II (HCC)    Gallbladder sludge    GERD (gastroesophageal reflux disease)    Hyperlipidemia    Hypertension    Iron deficiency anemia 09/05/2016   Lumbar radiculopathy    Mole (skin)    Neuropathy of foot    Renal cyst    Shingles    Skin lesions, generalized    1.2 CM FLAT FAWN COLOR AT THE T10 AREA JUST RIGHT OF SPINE     Past Surgical History:  Procedure Laterality Date   ABDOMINAL HYSTERECTOMY     total hysterectomy   BACK SURGERY     BIOPSY  03/07/2021   Procedure: BIOPSY;  Surgeon: Eartha Angelia Sieving, MD;  [facility];  Service: Gastroenterology;;   CESAREAN SECTION     x 2   ESOPHAGOGASTRODUODENOSCOPY (EGD) WITH PROPOFOL  N/A 03/07/2021   Procedure: ESOPHAGOGASTRODUODENOSCOPY (EGD) WITH PROPOFOL ;  Surgeon: Eartha Angelia,  Toribio, MD;  [facility]: [facility];  Service: Gastroenterology;  Laterality: N/A;  9:15   NECK SURGERY      Family History  Problem Relation Age of Onset   ADD / ADHD Other    COPD Father    Alcohol abuse Father    Depression Daughter    Alcohol abuse Mother    COPD Mother        on oxygen   Colon cancer Paternal Grandfather    Heart disease Sister    Heart disease Brother    Arthritis Sister        knee replacement   Diabetes Maternal Grandmother    Diabetes Sister    Social History   Tobacco Use   Smoking status: Never    Passive exposure: Yes   Smokeless tobacco: Never  Vaping Use   Vaping status: Never Used  Substance Use Topics    Alcohol use: No   Drug use: No    Allergies  Allergen Reactions   Cymbalta  [Duloxetine  Hcl]     Per pt it started making her mouth tingle and could not taste anything   Gabapentin     Itching, fidgety   Meloxicam  Other (See Comments)    Tongue tingling, and lost sense of taste.   Tramadol  Other (See Comments)    Tongue tingling, and lost sense of taste.    Current Meds  Medication Sig   ACCU-CHEK FASTCLIX LANCETS MISC EVERY DAY   albuterol  (PROVENTIL ) (2.5 MG/3ML) 0.083% nebulizer solution Take 3 mLs (2.5 mg total) by nebulization every 6 (six) hours as needed for wheezing or shortness of breath.   albuterol  (VENTOLIN  HFA) 108 (90 Base) MCG/ACT inhaler Inhale 2 puffs into the lungs every 6 (six) hours as needed for wheezing or shortness of breath.   ALPRAZolam  (XANAX ) 1 MG tablet Take 1 tablet (1 mg total) by mouth at bedtime as needed for anxiety.   atorvastatin  (LIPITOR) 40 MG tablet Take 1 tablet (40 mg total) by mouth daily.   Blood Glucose Monitoring Suppl (ACCU-CHEK GUIDE ME) w/Device KIT Use daily Dx E11.9   Boric Acid Vaginal ([employer] BORIC ACID) 600 MG SUPP Place 1 suppository vaginally at bedtime.   budesonide-glycopyrrolate-formoterol (BREZTRI  AEROSPHERE) 160-9-4.8 MCG/ACT AERO inhaler Inhale 2 puffs into the lungs 2 (two) times daily.   clobetasol  ointment (TEMOVATE ) 0.05 % Pea-sized amount to affected area as needed- at most nightly   cycloSPORINE (RESTASIS) 0.05 % ophthalmic emulsion Place 1 drop into both eyes at bedtime.   dicyclomine  (BENTYL ) 10 MG capsule Take 1 capsule (10 mg total) by mouth 2 times daily at 12 noon and 4 pm.   Dulaglutide  (TRULICITY ) 1.5 MG/0.5ML SOAJ Inject 1.5 mg into the skin once a week.   empagliflozin  (JARDIANCE ) 25 MG TABS tablet Take 1 tablet (25 mg total) by mouth daily.   glucose blood (ACCU-CHEK GUIDE TEST) test strip Use daily Dx E11.9   HYDROcodone -acetaminophen  (NORCO) 10-325 MG tablet Take 1 tablet by mouth 3 (three) times daily as  needed for pain.   latanoprost (XALATAN) 0.005 % ophthalmic solution Place 1 drop into both eyes at bedtime.   linaclotide  (LINZESS ) 290 MCG CAPS capsule Take 1 capsule (290 mcg total) by mouth daily before breakfast.   lisinopril  (ZESTRIL ) 5 MG tablet Take 0.5 tablets (2.5 mg total) by mouth daily.   meclizine (ANTIVERT) 25 MG tablet Take 50 mg by mouth 2 (two) times daily.   metFORMIN  (GLUCOPHAGE ) 1000 MG tablet Take 1 tablet (1,000 mg total)  by mouth 2 (two) times daily with a meal.   naproxen  (NAPROSYN ) 500 MG tablet Take 500 mg by mouth 2 (two) times daily.   omeprazole  (PRILOSEC) 40 MG capsule Take 1 capsule (40 mg total) by mouth daily.   ondansetron  (ZOFRAN ) 4 MG tablet Take 1 tablet (4 mg total) by mouth every 8 (eight) hours as needed for nausea or vomiting.   pregabalin  (LYRICA ) 150 MG capsule Take 1 capsule (150 mg total) by mouth 2 (two) times daily.   venlafaxine  XR (EFFEXOR -XR) 150 MG 24 hr capsule Take 2 capsules (300 mg total) by mouth at bedtime.    Objective: There were no vitals taken for this visit.  Physical Exam:  General: Alert and oriented. and No acute distress. Gait: Normal gait.  Left shoulder without deformity.  She has tenderness to palpation over the anterior shoulder, in line with the biceps tendon.  Tenderness to palpation over the scapula.  She has good range of motion, with obvious discomfort.  4+/5 supraspinatus strength.  Positive Jobe's.  Negative O'Brien's.  Fingers warm well-perfused.  IMAGING: I personally ordered and reviewed the following images  Left shoulder MRI  IMPRESSION: 1. Moderate supraspinatus tendinosis with irregularity along the articular surface. 2. Moderate tendinosis of the intra-articular portion of the long head of the biceps tendon with a partial-thickness tear.  New Medications:  No orders of the defined types were placed in this encounter.     Oneil DELENA Horde, MD  10/17/2023 9:57 AM

## 2023-10-20 ENCOUNTER — Ambulatory Visit (INDEPENDENT_AMBULATORY_CARE_PROVIDER_SITE_OTHER): Admitting: Psychiatry

## 2023-10-20 ENCOUNTER — Encounter (HOSPITAL_COMMUNITY): Payer: Self-pay | Admitting: Psychiatry

## 2023-10-20 VITALS — BP 135/84 | HR 92 | Temp 98.1°F | Ht <= 58 in | Wt 113.0 lb

## 2023-10-20 DIAGNOSIS — F321 Major depressive disorder, single episode, moderate: Secondary | ICD-10-CM

## 2023-10-20 DIAGNOSIS — F03918 Unspecified dementia, unspecified severity, with other behavioral disturbance: Secondary | ICD-10-CM | POA: Diagnosis not present

## 2023-10-20 DIAGNOSIS — F411 Generalized anxiety disorder: Secondary | ICD-10-CM | POA: Diagnosis not present

## 2023-10-20 MED ORDER — NUEDEXTA 20-10 MG PO CAPS: 1.0000 | ORAL_CAPSULE | Freq: Two times a day (BID) | ORAL | 2 refills | Status: DC

## 2023-10-20 MED ORDER — QUETIAPINE FUMARATE 200 MG PO TABS
600.0000 mg | ORAL_TABLET | Freq: Every day | ORAL | 2 refills | Status: DC
Start: 2023-10-20 — End: 2023-10-20

## 2023-10-20 MED ORDER — VENLAFAXINE HCL ER 150 MG PO CP24: 300.0000 mg | ORAL_CAPSULE | Freq: Every day | ORAL | 2 refills | Status: DC

## 2023-10-20 MED ORDER — ALPRAZOLAM 1 MG PO TABS: 1.0000 mg | ORAL_TABLET | Freq: Every evening | ORAL | 2 refills | Status: DC | PRN

## 2023-10-20 MED ORDER — VENLAFAXINE HCL ER 150 MG PO CP24: 150.0000 mg | ORAL_CAPSULE | Freq: Every day | ORAL | 2 refills | Status: DC

## 2023-10-20 MED ORDER — BUPROPION HCL ER (XL) 150 MG PO TB24: 150.0000 mg | ORAL_TABLET | ORAL | 2 refills | Status: DC

## 2023-10-20 NOTE — Progress Notes (Signed)
 [facility] MD/PA/NP OP Progress Note  10/20/2023 11:48 AM Charlotte Perez  MRN:  [id number]  Chief Complaint:  Chief Complaint  Patient presents with   Depression   Anxiety   Follow-up   HPI: This patient is a 63 year old married white female who lives with her husband in [place].  She is on disability.  The patient returns for follow-up after 3 months regarding her depression and anxiety.  She states that she has been more depressed and having low energy.  She sleeps several hours during the day as well as sleeping is at night.  She is not entirely sure why.  She blames some of it on the intense heat in the region lately.  She does not feel like the Effexor  is helping her is much as it had and the past for her depression.  She denies any thoughts of self-harm or suicide.  She is dealing with a husband who has early dementia and she constantly feels responsible for watching him.  This is causing stress as well.  I suggested that we cut down the Effexor  XR from 300 to 150 mg and add Wellbutrin  in the morning to help with her energy.  She is in agreement. Visit Diagnosis:    ICD-10-CM   1. Moderate single current episode of major depressive disorder (HCC)  F32.1     2. GAD (generalized anxiety disorder)  F41.1     3. 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   DDD (degenerative disc disease), cervical    DDD (degenerative disc disease), lumbar    Depression    Diabetes mellitus (HCC) 06/27/2010   Diabetes mellitus, type II (HCC)    Gallbladder sludge    GERD (gastroesophageal  reflux disease)    Hyperlipidemia    Hypertension    Iron deficiency anemia 09/05/2016   Lumbar radiculopathy    Mole (skin)    Neuropathy of foot    Renal cyst    Shingles    Skin lesions, generalized    1.2 CM FLAT FAWN COLOR AT THE T10 AREA JUST RIGHT OF SPINE     Past Surgical History:  Procedure Laterality Date   ABDOMINAL HYSTERECTOMY     total hysterectomy   BACK SURGERY     BIOPSY  03/07/2021   Procedure: BIOPSY;  Surgeon: Eartha Angelia Sieving, MD;  [facility];  Service: Gastroenterology;;   CESAREAN SECTION     x 2   ESOPHAGOGASTRODUODENOSCOPY (EGD) WITH PROPOFOL  N/A 03/07/2021   Procedure: ESOPHAGOGASTRODUODENOSCOPY (EGD) WITH PROPOFOL ;  Surgeon: Eartha Angelia Sieving, MD;  [facility];  Service: Gastroenterology;  Laterality: N/A;  9:15   NECK SURGERY      Family Psychiatric History: See below  Family History:  Family History  Problem Relation Age of Onset   ADD / ADHD Other    COPD Father    Alcohol abuse Father    Depression Daughter    Alcohol abuse Mother    COPD Mother        on oxygen  Colon cancer Paternal Grandfather    Heart disease Sister    Heart disease Brother    Arthritis Sister        knee replacement   Diabetes Maternal Grandmother    Diabetes Sister     Social History:  Social History   Socioeconomic History   Marital status: Legally Separated    Spouse name: Ronnie   Number of children: 3   Years of education: 9   Highest education level: 9th grade  Occupational History   Occupation: disablility  Tobacco Use   Smoking status: Never    Passive exposure: Yes   Smokeless tobacco: Never  Vaping Use   Vaping status: Never Used  Substance and Sexual Activity   Alcohol use: No   Drug use: No   Sexual activity: Not Currently    Birth control/protection: Surgical    Comment: hyst  Other Topics Concern   Not on file  Social History Narrative   Not on file   Social Drivers of Health    Financial Resource Strain: Medium Risk (07/10/2022)   Overall Financial Resource Strain (CARDIA)    Difficulty of Paying Living Expenses: Somewhat hard  Food Insecurity: Food Insecurity Present (07/10/2022)   Hunger Vital Sign    Worried About Running Out of Food in the Last Year: Often true    Ran Out of Food in the Last Year: Sometimes true  Transportation Needs: Unmet Transportation Needs (07/10/2022)   PRAPARE - Transportation    Lack of Transportation (Medical): No    Lack of Transportation (Non-Medical): Yes  Physical Activity: Insufficiently Active (07/10/2022)   Exercise Vital Sign    Days of Exercise per Week: 2 days    Minutes of Exercise per Session: 20 min  Stress: Stress Concern Present (07/10/2022)   [employer] of Occupational Health - Occupational Stress Questionnaire    Feeling of Stress : To some extent  Social Connections: Socially Isolated (07/10/2022)   Social Connection and Isolation Panel    Frequency of Communication with Friends and Family: Once a week    Frequency of Social Gatherings with Friends and Family: Never    Attends Religious Services: Never    Database administrator or Organizations: No    Attends Banker Meetings: Never    Marital Status: Married    Allergies:  Allergies  Allergen Reactions   Cymbalta  [Duloxetine  Hcl]     Per pt it started making her mouth tingle and could not taste anything   Gabapentin     Itching, fidgety   Meloxicam  Other (See Comments)    Tongue tingling, and lost sense of taste.   Tramadol  Other (See Comments)    Tongue tingling, and lost sense of taste.    Metabolic Disorder Labs: Lab Results  Component Value Date   HGBA1C 7.2 (H) 09/12/2023   No results found for: PROLACTIN Lab Results  Component Value Date   CHOL 168 09/12/2023   TRIG 112 09/12/2023   HDL 70 09/12/2023   CHOLHDL 2.4 09/12/2023   LDLCALC 78 09/12/2023   LDLCALC 77 02/07/2022   Lab Results  Component Value Date    TSH 0.706 09/12/2023   TSH 0.644 08/26/2022    Therapeutic Level Labs: No results found for: LITHIUM No results found for: VALPROATE No results found for: CBMZ  Current Medications: Current Outpatient Medications  Medication Sig Dispense Refill   ACCU-CHEK FASTCLIX LANCETS MISC EVERY DAY 102 each 2   albuterol  (PROVENTIL ) (2.5 MG/3ML) 0.083% nebulizer  solution Take 3 mLs (2.5 mg total) by nebulization every 6 (six) hours as needed for wheezing or shortness of breath. 75 mL 3   albuterol  (VENTOLIN  HFA) 108 (90 Base) MCG/ACT inhaler Inhale 2 puffs into the lungs every 6 (six) hours as needed for wheezing or shortness of breath. 8 g 0   atorvastatin  (LIPITOR) 40 MG tablet Take 1 tablet (40 mg total) by mouth daily. 90 tablet 3   Blood Glucose Monitoring Suppl (ACCU-CHEK GUIDE ME) w/Device KIT Use daily Dx E11.9 1 kit 0   budesonide-glycopyrrolate-formoterol (BREZTRI  AEROSPHERE) 160-9-4.8 MCG/ACT AERO inhaler Inhale 2 puffs into the lungs 2 (two) times daily. 10.7 g 11   buPROPion  (WELLBUTRIN  XL) 150 MG 24 hr tablet Take 1 tablet (150 mg total) by mouth every morning. 30 tablet 2   clobetasol  ointment (TEMOVATE ) 0.05 % Pea-sized amount to affected area as needed- at most nightly 30 g 1   cycloSPORINE (RESTASIS) 0.05 % ophthalmic emulsion Place 1 drop into both eyes at bedtime.     dicyclomine  (BENTYL ) 10 MG capsule Take 1 capsule (10 mg total) by mouth 2 times daily at 12 noon and 4 pm. 60 capsule 2   Dulaglutide  (TRULICITY ) 1.5 MG/0.5ML SOAJ Inject 1.5 mg into the skin once a week. 6 mL 3   empagliflozin  (JARDIANCE ) 25 MG TABS tablet Take 1 tablet (25 mg total) by mouth daily. 90 tablet 3   glucose blood (ACCU-CHEK GUIDE TEST) test strip Use daily Dx E11.9 100 each 3   HYDROcodone -acetaminophen  (NORCO) 10-325 MG tablet Take 1 tablet by mouth 3 (three) times daily as needed for pain.     linaclotide  (LINZESS ) 290 MCG CAPS capsule Take 1 capsule (290 mcg total) by mouth daily before  breakfast. 90 capsule 3   lisinopril  (ZESTRIL ) 5 MG tablet Take 0.5 tablets (2.5 mg total) by mouth daily. 90 tablet 3   meclizine (ANTIVERT) 25 MG tablet Take 50 mg by mouth 2 (two) times daily.     metFORMIN  (GLUCOPHAGE ) 1000 MG tablet Take 1 tablet (1,000 mg total) by mouth 2 (two) times daily with a meal. 180 tablet 3   naproxen  (NAPROSYN ) 500 MG tablet Take 500 mg by mouth 2 (two) times daily.     omeprazole  (PRILOSEC) 40 MG capsule Take 1 capsule (40 mg total) by mouth daily. 90 capsule 3   ondansetron  (ZOFRAN ) 4 MG tablet Take 1 tablet (4 mg total) by mouth every 8 (eight) hours as needed for nausea or vomiting. 20 tablet 0   pregabalin  (LYRICA ) 150 MG capsule Take 1 capsule (150 mg total) by mouth 2 (two) times daily. 60 capsule 2   ALPRAZolam  (XANAX ) 1 MG tablet Take 1 tablet (1 mg total) by mouth at bedtime as needed for anxiety. 30 tablet 2   Boric Acid Vaginal ([employer] BORIC ACID) 600 MG SUPP Place 1 suppository vaginally at bedtime. (Patient not taking: Reported on 10/20/2023) 7 suppository 3   latanoprost (XALATAN) 0.005 % ophthalmic solution Place 1 drop into both eyes at bedtime. (Patient not taking: Reported on 10/20/2023)     venlafaxine  XR (EFFEXOR -XR) 150 MG 24 hr capsule Take 1 capsule (150 mg total) by mouth at bedtime. 60 capsule 2   No current facility-administered medications for this visit.     Musculoskeletal: Strength & Muscle Tone: within normal limits Gait & Station: normal Patient leans: N/A  Psychiatric Specialty Exam: Review of Systems  Constitutional:  Positive for activity change.  Psychiatric/Behavioral:  Positive for dysphoric mood.   All  other systems reviewed and are negative.   Blood pressure 135/84, pulse 92, temperature 98.1 F (36.7 C), temperature source Oral, height 4' 10 (1.473 m), weight 113 lb (51.3 kg), SpO2 96%.Body mass index is 23.62 kg/m.  General Appearance: Casual and Fairly Groomed  Eye Contact:  Good  Speech:  Clear and Coherent   Volume:  Normal  Mood:  Dysphoric  Affect:  Flat  Thought Process:  Goal Directed  Orientation:  Full (Time, Place, and Person)  Thought Content: WDL   Suicidal Thoughts:  No  Homicidal Thoughts:  No  Memory:  Immediate;   Good Recent;   Good Remote;   Fair  Judgement:  Good  Insight:  Fair  Psychomotor Activity:  Decreased  Concentration:  Concentration: Fair and Attention Span: Fair  Recall:  Good  Fund of Knowledge: Good  Language: Good  Akathisia:  No  Handed:  Right  AIMS (if indicated): not done  Assets:  Communication Skills Desire for Improvement Resilience Social Support  ADL's:  Intact  Cognition: WNL  Sleep:  Good   Screenings: GAD-7    Flowsheet Row Office Visit from 09/12/2023 in [facility] Office Visit from 06/12/2023 in [facility] Office Visit from 03/24/2023 in [facility] Office Visit from 01/29/2023 in [facility] [place] Office Visit from 11/27/2022 in [facility]  Total GAD-7 Score 10 8 9 6 11    PHQ2-9    Flowsheet Row Office Visit from 09/12/2023 in [facility] Office Visit from 06/12/2023 in [facility] Office Visit from 03/24/2023 in [facility] Office Visit from 01/29/2023 in [facility] [place] Office Visit from 11/27/2022 in [facility]  PHQ-2 Total Score 5 5 5 5 3   PHQ-9 Total Score 19 15 13 13 9    Flowsheet Row Office Visit from 08/06/2022 in [facility] [place] Video Visit from 02/06/2022 in [facility] [place] Counselor from 12/05/2021 in [facility] [place]  C-SSRS RISK CATEGORY No Risk No Risk Error: Q3,  4, or 5 should not be populated when Q2 is No     Assessment and Plan: This patient is a 63 year old female with a history of depression and anxiety.  She has become more depressed recently and sleeping too much.  She will continue Effexor  XR but drop the dosage to 150 mg at bedtime for depression and continue Xanax  1 mg at bedtime as needed for anxiety or sleep.  We will add Wellbutrin  XL 150 mg every morning for energy and mood.  She will return to see me in 3 months or call sooner as needed  Collaboration of Care: Collaboration of Care: Primary Care Provider AEB notes are shared with PCP on the epic system  Patient/Guardian was advised Release of Information must be obtained prior to any record release in order to collaborate their care with an outside provider. Patient/Guardian was advised if they have not already done so to contact the registration department to sign all necessary forms in order for us  to release information regarding their care.   Consent: Patient/Guardian gives verbal consent for treatment and assignment of benefits for services provided during this visit. Patient/Guardian expressed understanding and agreed to proceed.    Barnie Gull, MD 10/20/2023, 11:48 AM

## 2023-10-22 ENCOUNTER — Ambulatory Visit: Admitting: Family Medicine

## 2023-10-22 ENCOUNTER — Other Ambulatory Visit (HOSPITAL_COMMUNITY): Payer: Self-pay | Admitting: Family Medicine

## 2023-10-22 ENCOUNTER — Encounter: Payer: Self-pay | Admitting: Family Medicine

## 2023-10-22 VITALS — BP 105/69 | HR 91 | Temp 96.4°F | Ht <= 58 in | Wt 113.0 lb

## 2023-10-22 DIAGNOSIS — R0683 Snoring: Secondary | ICD-10-CM | POA: Diagnosis not present

## 2023-10-22 DIAGNOSIS — H9313 Tinnitus, bilateral: Secondary | ICD-10-CM | POA: Diagnosis not present

## 2023-10-22 DIAGNOSIS — R400 Somnolence: Secondary | ICD-10-CM | POA: Diagnosis not present

## 2023-10-22 DIAGNOSIS — J4489 Other specified chronic obstructive pulmonary disease: Secondary | ICD-10-CM

## 2023-10-22 DIAGNOSIS — R1111 Vomiting without nausea: Secondary | ICD-10-CM

## 2023-10-22 DIAGNOSIS — Z1231 Encounter for screening mammogram for malignant neoplasm of breast: Secondary | ICD-10-CM

## 2023-10-22 MED ORDER — ONDANSETRON HCL 4 MG PO TABS: 4.0000 mg | ORAL_TABLET | Freq: Three times a day (TID) | ORAL | 0 refills | Status: DC | PRN

## 2023-10-22 NOTE — Progress Notes (Signed)
 Acute Office Visit  Subjective:     Patient ID: Charlotte Perez, female    DOB: 21-Feb-1961, 63 y.o.   MRN: [id number]  Chief Complaint  Patient presents with   Tinnitus   daytime sleepiness    HPI Patient is in today for referrals. She is requesting a referral to ENT and sleep studies.   Constant noise in both ears for a year. Worse at night. Sometimes buzzing, ringing, ocean sounds. Saw audiology- has decreased hearing wants to see ENT to make sure there isn't something else going on.   She reports nightly loud snoring for years. Denies witnessed apnea. She doesn't wake up feeling rested even after sleeping for 8 hours. She struggles to stay awake if she sits down during the day to watch TV. BH just started her on Wellbutrin  to improve energy.   Vomiting for last 2 days. NBNB emesis. Denies fever. Some epigastric pain- mild. No heartburn or nausea. She is having a running nose. She is staying well hydrated. Denies diarrhea, constipation, fever, chills, sore throat, cough.  No sick contacts.    ROS As per HPI.     Objective:    BP 105/69   Pulse 91   Temp (!) 96.4 F (35.8 C) (Temporal)   Ht 4' 10 (1.473 m)   Wt 113 lb (51.3 kg)   SpO2 96%   BMI 23.62 kg/m    Physical Exam Vitals and nursing note reviewed.  Constitutional:      General: She is not in acute distress.    Appearance: She is not ill-appearing or toxic-appearing.  HENT:     Right Ear: Tympanic membrane, ear canal and external ear normal.     Left Ear: Tympanic membrane, ear canal and external ear normal.     Nose: Nose normal.     Mouth/Throat:     Mouth: Mucous membranes are moist.     Pharynx: Oropharynx is clear. No oropharyngeal exudate or posterior oropharyngeal erythema.  Eyes:     General: No scleral icterus.       Right eye: No discharge.        Left eye: No discharge.     Conjunctiva/sclera: Conjunctivae normal.  Cardiovascular:     Rate and Rhythm: Normal rate and regular rhythm.      Heart sounds: Normal heart sounds. No murmur heard. Pulmonary:     Effort: Pulmonary effort is normal. No respiratory distress.     Breath sounds: Normal breath sounds. No wheezing.  Abdominal:     General: Bowel sounds are normal. There is no distension.     Palpations: Abdomen is soft.     Tenderness: There is no abdominal tenderness. There is no guarding or rebound.  Musculoskeletal:     Cervical back: No rigidity.     Right lower leg: No edema.     Left lower leg: No edema.  Skin:    General: Skin is warm and dry.  Neurological:     General: No focal deficit present.     Mental Status: She is alert and oriented to person, place, and time.  Psychiatric:        Mood and Affect: Mood normal.        Behavior: Behavior normal.     No results found for any visits on 10/22/23.      Assessment & Plan:   Charlotte Perez was seen today for tinnitus and daytime sleepiness.  Diagnoses and all orders for this visit:  Loud snoring  Daytime somnolence ? OSA. Referral to sleep studies placed.  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  -     Ambulatory referral to ENT  Vomiting without nausea, unspecified vomiting type ? Viral etiology. Zofran  prn. Stay well hydrated. Return to office for new or worsening symptoms, or if symptoms persist.  -     ondansetron  (ZOFRAN ) 4 MG tablet; Take 1 tablet (4 mg total) by mouth every 8 (eight) hours as needed for nausea or vomiting.  Return to office for new or worsening symptoms, or if symptoms persist.   The patient indicates understanding of these issues and agrees with the plan.  Charlotte CHRISTELLA Search, FNP

## 2023-10-27 ENCOUNTER — Telehealth (HOSPITAL_COMMUNITY): Payer: Self-pay

## 2023-10-27 NOTE — Telephone Encounter (Signed)
 called pharmacy they received and will get ready today.

## 2023-10-27 NOTE — Telephone Encounter (Signed)
 no answer and no message could be left. mailbox full. will try again to notifiy

## 2023-10-27 NOTE — Telephone Encounter (Signed)
 pt left a message that the rx for the wellbutrin  had not been received by pharmacy. pt was last seen on 7-28 next appt 10-28

## 2023-10-30 ENCOUNTER — Ambulatory Visit (HOSPITAL_COMMUNITY)
Admission: RE | Admit: 2023-10-30 | Discharge: 2023-10-30 | Disposition: A | Discharge status: Home / Self Care | Source: Ambulatory Visit | Attending: Family Medicine | Admitting: Family Medicine

## 2023-10-30 ENCOUNTER — Encounter (HOSPITAL_COMMUNITY): Payer: Self-pay

## 2023-10-30 DIAGNOSIS — Z1231 Encounter for screening mammogram for malignant neoplasm of breast: Secondary | ICD-10-CM | POA: Diagnosis not present

## 2023-11-03 ENCOUNTER — Emergency Department (HOSPITAL_COMMUNITY)
Admission: EM | Admit: 2023-11-03 | Discharge: 2023-11-04 | Disposition: A | Discharge status: Home / Self Care | Attending: Emergency Medicine | Admitting: Emergency Medicine

## 2023-11-03 ENCOUNTER — Encounter (HOSPITAL_COMMUNITY): Payer: Self-pay

## 2023-11-03 ENCOUNTER — Other Ambulatory Visit: Payer: Self-pay

## 2023-11-03 DIAGNOSIS — R1013 Epigastric pain: Secondary | ICD-10-CM | POA: Diagnosis not present

## 2023-11-03 DIAGNOSIS — R112 Nausea with vomiting, unspecified: Secondary | ICD-10-CM | POA: Insufficient documentation

## 2023-11-03 DIAGNOSIS — N281 Cyst of kidney, acquired: Secondary | ICD-10-CM | POA: Diagnosis not present

## 2023-11-03 DIAGNOSIS — R1010 Upper abdominal pain, unspecified: Secondary | ICD-10-CM | POA: Diagnosis not present

## 2023-11-03 DIAGNOSIS — K5730 Diverticulosis of large intestine without perforation or abscess without bleeding: Secondary | ICD-10-CM | POA: Diagnosis not present

## 2023-11-03 DIAGNOSIS — K760 Fatty (change of) liver, not elsewhere classified: Secondary | ICD-10-CM | POA: Diagnosis not present

## 2023-11-03 LAB — CBC WITH DIFFERENTIAL/PLATELET
Abs Immature Granulocytes: 0 K/uL (ref 0.00–0.07)
Basophils Absolute: 0.1 K/uL (ref 0.0–0.1)
Basophils Relative: 1 %
Eosinophils Absolute: 0.1 K/uL (ref 0.0–0.5)
Eosinophils Relative: 2 %
HCT: 42 % (ref 36.0–46.0)
Hemoglobin: 13.7 g/dL (ref 12.0–15.0)
Immature Granulocytes: 0 %
Lymphocytes Relative: 37 %
Lymphs Abs: 2 K/uL (ref 0.7–4.0)
MCH: 30.4 pg (ref 26.0–34.0)
MCHC: 32.6 g/dL (ref 30.0–36.0)
MCV: 93.1 fL (ref 80.0–100.0)
Monocytes Absolute: 0.5 K/uL (ref 0.1–1.0)
Monocytes Relative: 9 %
Neutro Abs: 2.7 K/uL (ref 1.7–7.7)
Neutrophils Relative %: 51 %
Platelets: 224 K/uL (ref 150–400)
RBC: 4.51 MIL/uL (ref 3.87–5.11)
RDW: 12.5 % (ref 11.5–15.5)
WBC: 5.3 K/uL (ref 4.0–10.5)
nRBC: 0 % (ref 0.0–0.2)

## 2023-11-03 LAB — COMPREHENSIVE METABOLIC PANEL WITH GFR
ALT: 14 U/L (ref 0–44)
AST: 17 U/L (ref 15–41)
Albumin: 4.3 g/dL (ref 3.5–5.0)
Alkaline Phosphatase: 52 U/L (ref 38–126)
Anion gap: 12 (ref 5–15)
BUN: 14 mg/dL (ref 8–23)
CO2: 27 mmol/L (ref 22–32)
Calcium: 9.9 mg/dL (ref 8.9–10.3)
Chloride: 100 mmol/L (ref 98–111)
Creatinine, Ser: 0.49 mg/dL (ref 0.44–1.00)
GFR, Estimated: >60 mL/min (ref >60)
Glucose, Bld: 110 mg/dL — ABNORMAL HIGH (ref 70–99)
Potassium: 3.7 mmol/L (ref 3.5–5.1)
Sodium: 139 mmol/L (ref 135–145)
Total Bilirubin: 0.8 mg/dL (ref 0.0–1.2)
Total Protein: 7.4 g/dL (ref 6.5–8.1)

## 2023-11-03 LAB — LIPASE, BLOOD: Lipase: 37 U/L (ref 11–51)

## 2023-11-03 NOTE — ED Triage Notes (Signed)
 Pt comes in for upper abd pain for the last 3 weeks. Pt is unable to keep most things down. Pt has tried Pepto bismol, buttermilk and her pcp gave her antemetic but it is not working. Pts pcp dx here with stomach bug 2 weeks ago. Last BM was yesterday morning. Pt is A&Ox4. Pt has also lost about 8 lbs unintentionally for the past 3 weeks.

## 2023-11-04 ENCOUNTER — Emergency Department (HOSPITAL_COMMUNITY)

## 2023-11-04 DIAGNOSIS — K5730 Diverticulosis of large intestine without perforation or abscess without bleeding: Secondary | ICD-10-CM | POA: Diagnosis not present

## 2023-11-04 DIAGNOSIS — R1010 Upper abdominal pain, unspecified: Secondary | ICD-10-CM | POA: Diagnosis not present

## 2023-11-04 DIAGNOSIS — N281 Cyst of kidney, acquired: Secondary | ICD-10-CM | POA: Diagnosis not present

## 2023-11-04 DIAGNOSIS — K760 Fatty (change of) liver, not elsewhere classified: Secondary | ICD-10-CM | POA: Diagnosis not present

## 2023-11-04 LAB — URINALYSIS, ROUTINE W REFLEX MICROSCOPIC
Bacteria, UA: NONE SEEN
Bilirubin Urine: NEGATIVE
Glucose, UA: >=500 mg/dL — AB
Hgb urine dipstick: NEGATIVE
Ketones, ur: 20 mg/dL — AB
Nitrite: NEGATIVE
Protein, ur: NEGATIVE mg/dL
Specific Gravity, Urine: 1.014 (ref 1.005–1.030)
pH: 6 (ref 5.0–8.0)

## 2023-11-04 MED ORDER — ONDANSETRON 4 MG PO TBDP: ORAL_TABLET | ORAL | 0 refills | Status: DC

## 2023-11-04 MED ORDER — SUCRALFATE 1 G PO TABS: 1.0000 g | ORAL_TABLET | Freq: Three times a day (TID) | ORAL | 0 refills | Status: DC

## 2023-11-04 MED ORDER — IOHEXOL 300 MG/ML  SOLN
100.0000 mL | Freq: Once | INTRAMUSCULAR | Status: AC | PRN
  Administered 2023-11-04 (×2): 100 mL via INTRAVENOUS

## 2023-11-04 NOTE — ED Provider Notes (Signed)
 [place] EMERGENCY DEPARTMENT AT [facility] Provider Note   CSN: [id number] Arrival date & time: 11/03/23  2004     Patient presents with: Emesis   Charlotte Perez is a 63 y.o. female.   Presents to the department for evaluation of 3 weeks of epigastric discomfort.  Patient reports pain that waxes and wanes, predominantly worsens when she eats or drinks.  She has had nausea and vomiting associated with the symptoms.  No fever.       Prior to Admission medications   Medication Sig Start Date End Date Taking? Authorizing Provider  ondansetron  (ZOFRAN -ODT) 4 MG disintegrating tablet 4mg  ODT q4 hours prn nausea/vomit 11/04/23  Yes Chrystopher Stangl, Lonni PARAS, MD  sucralfate  (CARAFATE ) 1 g tablet Take 1 tablet (1 g total) by mouth 4 (four) times daily -  with meals and at bedtime. 11/04/23  Yes Nillie Bartolotta, Lonni PARAS, MD  ACCU-CHEK FASTCLIX LANCETS MISC EVERY DAY 04/14/17   Lavell Lye A, FNP  albuterol  (PROVENTIL ) (2.5 MG/3ML) 0.083% nebulizer solution Take 3 mLs (2.5 mg total) by nebulization every 6 (six) hours as needed for wheezing or shortness of breath. 03/06/22   Joesph Annabella HERO, FNP  albuterol  (VENTOLIN  HFA) 108 (90 Base) MCG/ACT inhaler Inhale 2 puffs into the lungs every 6 (six) hours as needed for wheezing or shortness of breath. 05/06/22   Gladis Mary-Margaret, FNP  ALPRAZolam  (XANAX ) 1 MG tablet Take 1 tablet (1 mg total) by mouth at bedtime as needed for anxiety. 10/20/23 10/19/24  Okey Barnie SAUNDERS, MD  atorvastatin  (LIPITOR) 40 MG tablet Take 1 tablet (40 mg total) by mouth daily. 09/12/23   Joesph Annabella HERO, FNP  Blood Glucose Monitoring Suppl (ACCU-CHEK GUIDE ME) w/Device KIT Use daily Dx E11.9 06/25/23   Joesph Annabella HERO, FNP  Boric Acid Vaginal ([employer] BORIC ACID) 600 MG SUPP Place 1 suppository vaginally at bedtime. 10/20/22   Ozan, Jennifer, DO  budesonide-glycopyrrolate-formoterol (BREZTRI  AEROSPHERE) 160-9-4.8 MCG/ACT AERO inhaler Inhale 2 puffs into the lungs 2  (two) times daily. 09/12/23   Joesph Annabella HERO, FNP  buPROPion  (WELLBUTRIN  XL) 150 MG 24 hr tablet Take 1 tablet (150 mg total) by mouth every morning. 10/20/23 10/19/24  Okey Barnie SAUNDERS, MD  clobetasol  ointment (TEMOVATE ) 0.05 % Pea-sized amount to affected area as needed- at most nightly 10/09/23   Ozan, Jennifer, DO  cycloSPORINE (RESTASIS) 0.05 % ophthalmic emulsion Place 1 drop into both eyes at bedtime.    [provider]  dicyclomine  (BENTYL ) 10 MG capsule Take 1 capsule (10 mg total) by mouth 2 times daily at 12 noon and 4 pm. 09/10/22   Carlan, Chelsea L, NP  Dulaglutide  (TRULICITY ) 1.5 MG/0.5ML SOAJ Inject 1.5 mg into the skin once a week. 09/12/23   Joesph Annabella HERO, FNP  empagliflozin  (JARDIANCE ) 25 MG TABS tablet Take 1 tablet (25 mg total) by mouth daily. 09/12/23   Joesph Annabella HERO, FNP  glucose blood (ACCU-CHEK GUIDE TEST) test strip Use daily Dx E11.9 06/25/23   Joesph Annabella HERO, FNP  HYDROcodone -acetaminophen  (NORCO) 10-325 MG tablet Take 1 tablet by mouth 3 (three) times daily as needed for pain. 02/19/21   [provider]  latanoprost (XALATAN) 0.005 % ophthalmic solution Place 1 drop into both eyes at bedtime.    [provider]  linaclotide  (LINZESS ) 290 MCG CAPS capsule Take 1 capsule (290 mcg total) by mouth daily before breakfast. 09/17/23   Eartha Flavors, Toribio, MD  lisinopril  (ZESTRIL ) 5 MG tablet Take 0.5 tablets (2.5 mg  total) by mouth daily. 09/12/23   Joesph Annabella HERO, FNP  meclizine (ANTIVERT) 25 MG tablet Take 50 mg by mouth 2 (two) times daily. 11/19/21   [provider]  metFORMIN  (GLUCOPHAGE ) 1000 MG tablet Take 1 tablet (1,000 mg total) by mouth 2 (two) times daily with a meal. 09/12/23   Joesph Annabella HERO, FNP  naproxen  (NAPROSYN ) 500 MG tablet Take 500 mg by mouth 2 (two) times daily. 04/20/23   [provider]  omeprazole  (PRILOSEC) 40 MG capsule Take 1 capsule (40 mg total) by mouth daily. 09/12/23   Joesph Annabella HERO,  FNP  ondansetron  (ZOFRAN ) 4 MG tablet Take 1 tablet (4 mg total) by mouth every 8 (eight) hours as needed for nausea or vomiting. 10/22/23   Joesph Annabella HERO, FNP  pregabalin  (LYRICA ) 150 MG capsule Take 1 capsule (150 mg total) by mouth 2 (two) times daily. 09/11/22   Gershon Donnice SAUNDERS, DPM  venlafaxine  XR (EFFEXOR -XR) 150 MG 24 hr capsule Take 1 capsule (150 mg total) by mouth at bedtime. 10/20/23   Okey Barnie SAUNDERS, MD    Allergies: Cymbalta  [duloxetine  hcl], Gabapentin, Meloxicam , and Tramadol     Review of Systems  Updated Vital Signs BP 136/77 (BP Location: Right Arm)   Pulse 81   Temp 98.3 F (36.8 C) (Oral)   Resp 15   Ht 4' 10 (1.473 m)   Wt 50.8 kg   SpO2 99%   BMI 23.41 kg/m   Physical Exam Vitals and nursing note reviewed.  Constitutional:      General: She is not in acute distress.    Appearance: She is well-developed.  HENT:     Head: Normocephalic and atraumatic.     Mouth/Throat:     Mouth: Mucous membranes are moist.  Eyes:     General: Vision grossly intact. Gaze aligned appropriately.     Extraocular Movements: Extraocular movements intact.     Conjunctiva/sclera: Conjunctivae normal.  Cardiovascular:     Rate and Rhythm: Normal rate and regular rhythm.     Pulses: Normal pulses.     Heart sounds: Normal heart sounds, S1 normal and S2 normal. No murmur heard.    No friction rub. No gallop.  Pulmonary:     Effort: Pulmonary effort is normal. No respiratory distress.     Breath sounds: Normal breath sounds.  Abdominal:     General: Bowel sounds are normal.     Palpations: Abdomen is soft.     Tenderness: There is abdominal tenderness in the epigastric area. There is no guarding or rebound.     Hernia: No hernia is present.  Musculoskeletal:        General: No swelling.     Cervical back: Full passive range of motion without pain, normal range of motion and neck supple. No spinous process tenderness or muscular tenderness. Normal range of motion.      Right lower leg: No edema.     Left lower leg: No edema.  Skin:    General: Skin is warm and dry.     Capillary Refill: Capillary refill takes less than 2 seconds.     Findings: No ecchymosis, erythema, rash or wound.  Neurological:     General: No focal deficit present.     Mental Status: She is alert and oriented to person, place, and time.     GCS: GCS eye subscore is 4. GCS verbal subscore is 5. GCS motor subscore is 6.     Cranial Nerves: Cranial  nerves 2-12 are intact.     Sensory: Sensation is intact.     Motor: Motor function is intact.     Coordination: Coordination is intact.  Psychiatric:        Attention and Perception: Attention normal.        Mood and Affect: Mood normal.        Speech: Speech normal.        Behavior: Behavior normal.     (all labs ordered are listed, but only abnormal results are displayed) Labs Reviewed  COMPREHENSIVE METABOLIC PANEL WITH GFR - Abnormal; Notable for the following components:      Result Value   Glucose, Bld 110 (*)    All other components within normal limits  URINALYSIS, ROUTINE W REFLEX MICROSCOPIC - Abnormal; Notable for the following components:   Color, Urine STRAW (*)    Glucose, UA >=500 (*)    Ketones, ur 20 (*)    Leukocytes,Ua MODERATE (*)    All other components within normal limits  CBC WITH DIFFERENTIAL/PLATELET  LIPASE, BLOOD    EKG: None  Radiology: CT ABDOMEN PELVIS W CONTRAST Result Date: 11/04/2023 CLINICAL DATA:  Upper abdominal pain for 3 weeks. EXAM: CT ABDOMEN AND PELVIS WITH CONTRAST TECHNIQUE: Multidetector CT imaging of the abdomen and pelvis was performed using the standard protocol following bolus administration of intravenous contrast. RADIATION DOSE REDUCTION: This exam was performed according to the departmental dose-optimization program which includes automated exposure control, adjustment of the mA and/or kV according to patient size and/or use of iterative reconstruction technique. CONTRAST:   OMNIPAQUE  IOHEXOL  300 MG/ML  SOLN COMPARISON:  September 11, 2022 FINDINGS: Lower chest: No acute abnormality. Hepatobiliary: There is diffuse fatty infiltration of the liver parenchyma. No focal liver abnormality is seen. No gallstones, gallbladder wall thickening, or biliary dilatation. Pancreas: Unremarkable. No pancreatic ductal dilatation or surrounding inflammatory changes. Spleen: Normal in size without focal abnormality. Adrenals/Urinary Tract: Adrenal glands are unremarkable. Kidneys are normal in size, without renal calculi or hydronephrosis. Stable bilateral parapelvic renal cysts are seen. Bladder is unremarkable. Stomach/Bowel: Stomach is within normal limits. Appendix appears normal. No evidence of bowel wall thickening, distention, or inflammatory changes. Noninflamed diverticula are seen within the proximal sigmoid colon. Vascular/Lymphatic: No significant vascular findings are present. No enlarged abdominal or pelvic lymph nodes. Reproductive: Status post hysterectomy. No adnexal masses. Other: No abdominal wall hernia or abnormality. No abdominopelvic ascites. Musculoskeletal: Postoperative changes are seen within the lower lumbar spine. No acute osseous abnormalities are identified. IMPRESSION: 1. Hepatic steatosis. 2. Sigmoid diverticulosis. 3. Postoperative changes within the lower lumbar spine. Electronically Signed   By: Suzen Dials M.D.   On: 11/04/2023 01:42     Procedures   Medications Ordered in the ED  iohexol  (OMNIPAQUE ) 300 MG/ML solution 100 mL (100 mLs Intravenous Contrast Given 11/04/23 0012)                                    Medical Decision Making Amount and/or Complexity of Data Reviewed Radiology: ordered.  Risk Prescription drug management.   Differential Diagnosis considered includes, but not limited to: Cholelithiasis; cholecystitis; cholangitis; bowel obstruction; esophagitis; gastritis; peptic ulcer disease; pancreatitis; cardiac.  Patient  reports that she has been experiencing epigastric discomfort for 3 weeks.  Symptoms worsen when she eats.  Examination does not reveal any tenderness in the right upper quadrant, she does have moderate epigastric tenderness.  No  guarding or rebound tenderness.  Vital signs are normal.  Lab work was entirely normal.  No anemia.  No leukocytosis.  Normal LFTs and lipase.  Patient underwent CT scan that does not show any acute abnormality to explain her symptoms.  Will treat symptomatically for presumed gastritis, follow-up with PCP for possible GI referral.     Final diagnoses:  Epigastric pain    ED Discharge Orders          Ordered    ondansetron  (ZOFRAN -ODT) 4 MG disintegrating tablet        11/04/23 0211    sucralfate  (CARAFATE ) 1 g tablet  3 times daily with meals & bedtime        11/04/23 0211               Haze Lonni PARAS, MD 11/04/23 [phone]

## 2023-11-04 NOTE — ED Notes (Signed)
 Patient transported to CT

## 2023-11-04 NOTE — Discharge Instructions (Signed)
 Make sure you are taking the Prilosec that was started in June.  This will help protect your stomach.  Follow-up with your doctor, you may need a referral to a gastroenterologist.

## 2023-11-06 DIAGNOSIS — R030 Elevated blood-pressure reading, without diagnosis of hypertension: Secondary | ICD-10-CM | POA: Diagnosis not present

## 2023-11-06 DIAGNOSIS — Z6822 Body mass index (BMI) 22.0-22.9, adult: Secondary | ICD-10-CM | POA: Diagnosis not present

## 2023-11-06 DIAGNOSIS — E119 Type 2 diabetes mellitus without complications: Secondary | ICD-10-CM | POA: Diagnosis not present

## 2023-11-06 DIAGNOSIS — Z79899 Other long term (current) drug therapy: Secondary | ICD-10-CM | POA: Diagnosis not present

## 2023-11-06 DIAGNOSIS — M545 Low back pain, unspecified: Secondary | ICD-10-CM | POA: Diagnosis not present

## 2023-11-06 DIAGNOSIS — Z09 Encounter for follow-up examination after completed treatment for conditions other than malignant neoplasm: Secondary | ICD-10-CM | POA: Diagnosis not present

## 2023-11-07 ENCOUNTER — Ambulatory Visit: Admitting: Family Medicine

## 2023-11-07 ENCOUNTER — Encounter: Payer: Self-pay | Admitting: Family Medicine

## 2023-11-07 VITALS — BP 118/74 | HR 86 | Temp 98.0°F | Ht <= 58 in | Wt 111.4 lb

## 2023-11-07 DIAGNOSIS — R300 Dysuria: Secondary | ICD-10-CM

## 2023-11-07 DIAGNOSIS — E1142 Type 2 diabetes mellitus with diabetic polyneuropathy: Secondary | ICD-10-CM | POA: Diagnosis not present

## 2023-11-07 DIAGNOSIS — N760 Acute vaginitis: Secondary | ICD-10-CM | POA: Diagnosis not present

## 2023-11-07 DIAGNOSIS — K219 Gastro-esophageal reflux disease without esophagitis: Secondary | ICD-10-CM

## 2023-11-07 LAB — MICROSCOPIC EXAMINATION
Renal Epithel, UA: NONE SEEN /HPF
Yeast, UA: NONE SEEN

## 2023-11-07 LAB — URINALYSIS, ROUTINE W REFLEX MICROSCOPIC
Bilirubin, UA: NEGATIVE
Ketones, UA: NEGATIVE
Nitrite, UA: NEGATIVE
Specific Gravity, UA: 1.01 (ref 1.005–1.030)
Urobilinogen, Ur: 0.2 mg/dL (ref 0.2–1.0)
pH, UA: 7 (ref 5.0–7.5)

## 2023-11-07 LAB — WET PREP FOR TRICH, YEAST, CLUE
Clue Cell Exam: NEGATIVE
Trichomonas Exam: NEGATIVE
Yeast Exam: NEGATIVE

## 2023-11-07 MED ORDER — PANTOPRAZOLE SODIUM 40 MG PO TBEC: 40.0000 mg | DELAYED_RELEASE_TABLET | Freq: Every day | ORAL | 3 refills | Status: DC

## 2023-11-07 NOTE — Progress Notes (Signed)
 Acute Office Visit  Subjective:     Patient ID: Charlotte Perez, female    DOB: 02/20/1961, 63 y.o.   MRN: [id number]  Chief Complaint  Patient presents with   Abdominal Pain    Abdominal Pain    History of Present Illness   Charlotte Perez is a 63 year old female who presents with persistent epigastric pain.  Epigastric pain and gastrointestinal symptoms - Constant epigastric pain for approximately one month - Pain worsens with eating, walking, and sitting - No heartburn - Nausea present with a sensation of 'something grabbing and pulling' in the epigastric area - Vomiting every other day, currently decreasing in frequency - Food regurgitation, especially when burping - Symptoms can wake her from sleep - No trouble swallowing, sore throat, or significant coughing  Recent diagnostic evaluation and treatment - CT scan from ER visit on November 03, 2023, showed no acute findings - Liver enzymes, white blood cell count, and lipase levels were normal - Currently taking Carafate  and Prilosec 40 mg for acid reflux  Genitourinary and vaginal symptoms - Itching and burning during urination - Milky white vaginal discharge began a couple of days ago - No recent antibiotic use - Decreased urinary frequency despite adequate fluid intake       Review of Systems  Gastrointestinal:  Positive for abdominal pain.   As per HPI.      Objective:    BP 118/74   Pulse 86   Temp 98 F (36.7 C) (Temporal)   Ht 4' 10 (1.473 m)   Wt 111 lb 6.4 oz (50.5 kg)   SpO2 99%   BMI 23.28 kg/m  Wt Readings from Last 3 Encounters:  11/07/23 111 lb 6.4 oz (50.5 kg)  11/03/23 112 lb (50.8 kg)  10/22/23 113 lb (51.3 kg)      Physical Exam Vitals and nursing note reviewed.  Constitutional:      General: She is not in acute distress.    Appearance: She is not ill-appearing, toxic-appearing or diaphoretic.  HENT:     Mouth/Throat:     Mouth: Mucous membranes are moist.     Pharynx:  Oropharynx is clear.  Eyes:     General: No scleral icterus. Cardiovascular:     Rate and Rhythm: Normal rate and regular rhythm.     Heart sounds: Normal heart sounds. No murmur heard. Pulmonary:     Effort: Pulmonary effort is normal. No respiratory distress.     Breath sounds: Normal breath sounds. No wheezing, rhonchi or rales.  Abdominal:     General: There is no distension.     Palpations: Abdomen is soft.     Tenderness: There is abdominal tenderness in the epigastric area. There is no right CVA tenderness, left CVA tenderness, guarding or rebound.  Skin:    General: Skin is warm and dry.  Neurological:     General: No focal deficit present.     Mental Status: She is alert and oriented to person, place, and time.  Psychiatric:        Mood and Affect: Mood normal.        Behavior: Behavior normal.    Urine dipstick shows positive for RBC's, positive for protein, positive for glucose, and positive for leukocytes.  Micro exam: 6-40 WBC's per HPF, 0-2 RBC's per HPF, and few+ bacteria.  Microscopic wet-mount exam shows negative for pathogens, normal epithelial cells.     Assessment & Plan:   Charlotte Perez was seen today for  abdominal pain.  Diagnoses and all orders for this visit:  Gastroesophageal reflux disease without esophagitis Uncontrolled.  - Switch from omeprazole  to pantoprazole , take on empty stomach in the morning. - Continue Carafate  as prescribed. - Continue Zofran  as needed. - Hold Trulicity  for two weeks to assess symptom improvement. - Contact GI for appointment -     pantoprazole  (PROTONIX ) 40 MG tablet; Take 1 tablet (40 mg total) by mouth daily.  Acute vaginitis Negative wet prep today. ? Atrophic vaginitis. -     WET PREP FOR TRICH, YEAST, CLUE  Dysuria Increase hydration. UA not compelling for UTI. Culture pending.  -     Urinalysis, Routine w reflex microscopic -     Urine Culture  Type 2 diabetes mellitus with diabetic polyneuropathy, without  long-term current use of insulin (HCC) Last A1c 7.2. Will have her hold Trulicity  for 2-3 weeks to see him GERD symptoms improve. She will let me know how she is feeling in a few weeks.   Keep scheduled chronic follow up appt.   The patient indicates understanding of these issues and agrees with the plan.  Charlotte CHRISTELLA Search, FNP

## 2023-11-09 LAB — URINE CULTURE

## 2023-11-10 ENCOUNTER — Ambulatory Visit: Payer: Self-pay | Admitting: Family Medicine

## 2023-11-10 DIAGNOSIS — Z79899 Other long term (current) drug therapy: Secondary | ICD-10-CM | POA: Diagnosis not present

## 2023-11-13 ENCOUNTER — Telehealth: Payer: Self-pay | Admitting: Orthopaedic Surgery

## 2023-11-13 NOTE — Telephone Encounter (Signed)
 What is a unit ?  Dr Brenna did not order anything for her.  I will call her to see what she is talking about

## 2023-11-13 NOTE — Telephone Encounter (Signed)
 Dr. Janae pt - spoke w/the pt, she stated that the people we ordered the unit from needs more information.   [phone]

## 2023-11-14 ENCOUNTER — Encounter: Payer: Self-pay | Admitting: Radiology

## 2023-11-19 ENCOUNTER — Encounter (INDEPENDENT_AMBULATORY_CARE_PROVIDER_SITE_OTHER): Payer: Self-pay

## 2023-11-20 ENCOUNTER — Telehealth: Payer: Self-pay

## 2023-11-20 ENCOUNTER — Telehealth (HOSPITAL_COMMUNITY): Payer: Self-pay | Admitting: *Deleted

## 2023-11-20 NOTE — Telephone Encounter (Signed)
 Patient called stating that provider put her on Wellbutrin  XL and it's getting where its hurting her stomach. Per pt she stopped taking this medication this morning. Patient number is [phone].

## 2023-11-20 NOTE — Telephone Encounter (Signed)
 Copied from CRM [id number]. Topic: Clinical - Medical Advice >> Nov 20, 2023 10:42 AM Charlotte Perez wrote: Reason for CRM: Patient wanted to leave a message for Dr.Tiffany Joesph stated she is still having stomach pains for 2weeks after taking medication that Dr prescribe for her. And she looked up the side effects and one of them are stomach pains. Callback number is [phone]

## 2023-11-20 NOTE — Telephone Encounter (Signed)
 Pt states the Wellbutrin  is making her stomach hurt and I advised pt to call Dr Okey since they prescribe it and pt voiced understanding.

## 2023-11-21 NOTE — Telephone Encounter (Signed)
 She can stop the Wellbutrin  and go back to Effexor  on her previous dose of 300 mg a day.  She can discuss with Dr. Okey when she is back next week.  Hopefully stomach issues resolved after stopping the Wellbutrin .

## 2023-11-21 NOTE — Telephone Encounter (Signed)
 Pt is aware and verbalized understanding.

## 2023-12-01 ENCOUNTER — Telehealth: Payer: Self-pay | Admitting: *Deleted

## 2023-12-01 DIAGNOSIS — E1169 Type 2 diabetes mellitus with other specified complication: Secondary | ICD-10-CM

## 2023-12-01 DIAGNOSIS — E1165 Type 2 diabetes mellitus with hyperglycemia: Secondary | ICD-10-CM

## 2023-12-01 DIAGNOSIS — E1142 Type 2 diabetes mellitus with diabetic polyneuropathy: Secondary | ICD-10-CM

## 2023-12-01 DIAGNOSIS — J4489 Other specified chronic obstructive pulmonary disease: Secondary | ICD-10-CM

## 2023-12-01 NOTE — Progress Notes (Signed)
 Complex Care Management Note  Care Guide Note 12/01/2023 Name: BILAN TEDESCO MRN: [id number] DOB: [birth year]  ABIR EROH is a 63 y.o. year old female who sees Joesph Annabella HERO, FNP for primary care. I reached out to [name] by phone today to offer complex care management services.  Ms. Demuro was given information about Complex Care Management services today including:   The Complex Care Management services include support from the care team which includes your Nurse Care Manager, Clinical Social Worker, or Pharmacist.  The Complex Care Management team is here to help remove barriers to the health concerns and goals most important to you. Complex Care Management services are voluntary, and the patient may decline or stop services at any time by request to their care team member.   Complex Care Management Consent Status: Patient agreed to services and verbal consent obtained.   Follow up plan:  Telephone appointment with complex care management team member scheduled for:  12/02/23  Encounter Outcome:  Patient Scheduled  Harlene Satterfield  [facility]  [facility] Guide  Direct Dial: [phone]  Fax [phone]

## 2023-12-02 ENCOUNTER — Other Ambulatory Visit: Payer: Self-pay | Admitting: *Deleted

## 2023-12-02 NOTE — Patient Outreach (Signed)
 Complex Care Management   Visit Note  12/18/2023 updated assessment for 12/02/23   Name:  Charlotte Perez MRN: [id number] DOB: [birth year]  Situation: Referral received for Complex Care Management related to COPD, Diabetes with Complications, and asthma, hyperglycemia, neuropathy, hyperlipidemia I obtained verbal consent from Patient.  Visit completed with Patient  on the phone   Reports her primary concerns today is related to her stomach  She has a GI procedure pending  She denies constipation as she reports being on Linzess  that helps with this She voiced understanding of the impression results from her last imaging report after RN CM discussed it with her. The impression discusses Hepatic steatosis & sigmoid diverticulosis She reports she had not been aware of this information She asked various questions  She voiced understanding of worsening liver symptoms that may include: Feeling weak and tired. Losing weight. Feeling like you may throw up. Throwing up. Jaundice. This is when your skin or the white parts of your eyes turn yellow. Swelling in your belly or legs. 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   DDD (degenerative disc disease), cervical    DDD (degenerative disc disease), lumbar    Depression    Diabetes mellitus (HCC) 06/27/2010   Diabetes mellitus, type II (HCC)    Gallbladder sludge    GERD (gastroesophageal reflux disease)    Hyperlipidemia     Hypertension    Iron deficiency anemia 09/05/2016   Lumbar radiculopathy    Mole (skin)    Neuropathy of foot    Renal cyst    Shingles    Skin lesions, generalized    1.2 CM FLAT FAWN COLOR AT THE T10 AREA JUST RIGHT OF SPINE     Assessment: Patient Reported Symptoms:  Cognitive Cognitive Status: Alert and oriented to person, place, and time, Normal speech and language skills Cognitive/Intellectual Conditions Management [RPT]: Behavior Disorders, Intellectual Disability Behavior Disorders: bipolar, depression reading disability   Health Maintenance Behaviors: Annual physical exam, Sleep adequate Healing Pattern: Unsure Health Facilitated by: Rest, Pain control  Neurological Neurological Review of Symptoms: Weakness Neurological Management Strategies: Routine screening Neurological Self-Management Outcome: 3 (uncertain)  HEENT HEENT Symptoms Reported: No symptoms reported HEENT Management Strategies: Routine screening HEENT Self-Management Outcome: 4 (good)    Cardiovascular Cardiovascular Symptoms Reported: No symptoms reported Cardiovascular Management Strategies: Medical device, Medication therapy, Routine screening Cardiovascular Self-Management Outcome: 3 (uncertain)  Respiratory Respiratory Symptoms Reported: No symptoms reported Respiratory Management Strategies: Routine screening Respiratory Self-Management Outcome: 4 (good)  Endocrine Endocrine Symptoms Reported: Blurry vision, Hunger, Weakness or fatigue Is patient diabetic?: Yes Is patient checking blood sugars at home?: No Endocrine Self-Management Outcome: 3 (uncertain)  Gastrointestinal Gastrointestinal Symptoms Reported: Abdominal pain or discomfort, Change in appetite, Cramping      Genitourinary Genitourinary Symptoms Reported: Other Other Genitourinary Symptoms: history of frequent UTIs Genitourinary Management Strategies: Fluid modification Genitourinary Self-Management Outcome: 3 (uncertain)   Integumentary Integumentary  Symptoms Reported: No symptoms reported Skin Management Strategies: Routine screening Skin Self-Management Outcome: 4 (good)  Musculoskeletal Musculoskelatal Symptoms Reviewed: Weakness, Other Other Musculoskeletal Symptoms: recent shoulder pain left Rotator Musculoskeletal Management Strategies: Routine screening, Medication therapy Musculoskeletal Self-Management Outcome: 3 (uncertain) Falls in the past year?: No Number of falls in past year: 1 or less Was there an injury with Fall?: No Fall Risk Category Calculator: 0 Patient Fall Risk Level: Low Fall Risk Patient at Risk for Falls Due to: Mental status change Fall risk Follow up: Falls evaluation completed  Psychosocial Psychosocial Symptoms Reported: Alteration in eating habits, Depression - if selected complete PHQ 2-9, Sadness - if selected complete PHQ 2-9 Behavioral Management Strategies: Activity, Adequate rest, Community resources, Coping strategies, Counseling, Medication therapy Behavioral Health Self-Management Outcome: 3 (uncertain) Major Change/Loss/Stressor/Fears (CP): Environment, Medical condition, family, Medical condition, self, Resources Techniques to [employer] with Loss/Stress/Change: Counseling, Diversional activities, Medication Quality of Family Relationships: helpful, supportive Do you feel physically threatened by others?: No    12/18/2023    PHQ2-9 Depression Screening   Little interest or pleasure in doing things Several days  Feeling down, depressed, or hopeless Several days  PHQ-2 - Total Score 2  Trouble falling or staying asleep, or sleeping too much More than half the days  Feeling tired or having little energy More than half the days  Poor appetite or overeating  Several days  Feeling bad about yourself - or that you are a failure or have let yourself or your family down Several days  Trouble concentrating on things, such as reading the newspaper or watching television  Several days  Moving or speaking so slowly that other people could have noticed.  Or the opposite - being so fidgety or restless that you have been moving around a lot more than usual Not at all  Thoughts that you would be better off dead, or hurting yourself in some way Not at all  PHQ2-9 Total Score 9  If you checked off any problems, how difficult have these problems made it for you to do your work, take care of things at home, or get along with other people Somewhat difficult  Depression Interventions/Treatment Medication, Counseling, Currently on Treatment    There were no vitals filed for this visit.  Medications Reviewed Today   Medications were not reviewed in this encounter     Recommendation:   12/15/23 PCP Follow-up Specialty provider follow-up GI Mitzie Boettcher on 12/11/23  Continue Current Plan of Care Review of education being sent via mail and outreach to RN CM and MDs with questions  Follow Up Plan:   Telephone follow up appointment date/time:  12/17/23 3 pm  Michaelene Dutan L. Ramonita, RN, BSN, CCM [place]  [facility], [facility] RN Care Manager Direct Dial: [phone]  Fax: [phone]

## 2023-12-03 ENCOUNTER — Other Ambulatory Visit: Payer: Self-pay | Admitting: *Deleted

## 2023-12-03 NOTE — Telephone Encounter (Signed)
 The patient lvm today about a unit 10 again, she said they need more information.  [phone]

## 2023-12-03 NOTE — Patient Outreach (Signed)
 Complex Care Management   Visit Note  01/01/2024 updated note from 12/03/23  Name:  Charlotte Perez MRN: [id number] DOB: March 01, 1961  Situation: Referral received for Complex Care Management related to COPD, Diabetes with Complications, and asthma, hyperlipidemia, neuropathy, hyperglycemia  I obtained verbal consent from Patient.  Visit completed with Patient  on the phone  Charlotte Perez reports having abdominal pain from time she gets up before eating or drinking, growling, flatus, bloated increase hunger- until she goes to bed Also with pain of her left shoulder & arm which she has been informed is tendinitis by Dr Brenna, orthopedic MD She reports they are trying to get her a TENS unit  She is being seen by a pain management provider   Home improvement needs  She reports poor heating, floor in trailer needs repair she rents her trailer for $400/mo  She pays electricity, has well water that is reported to have a bad taste & to be a brown milky color  She has 3 pets  She reports she has tried to obtain [place] 8 services in 2024 unsuccessfully  She reports no intake of alcohol   She reports being on Trulicity  to help with her weight but it had been discontinued to clarify if it was the origin of her abdominal pain.  She  reports she feels like she has gained weight since being off of Trulicity   She is scheduled to return to see her pcp on 12/11/23  Literacy  Poor reading skills is reported She reports she finished 9-10 grade & was in special education class She reports her reading level is at a  1st-2nd level Her spouse has 5th grade education   Behavioral health Charlotte Perez put her on Wellbutrin  then the patient reports she started noticing increase abdominal pain so the patient stopped taking Wellbutrin   Constitpation Stay constipated per patient & takes Linzess   Status of sleep apnea or ENT services pending  Background:   Past Medical History:  Diagnosis Date   ADD  (attention deficit disorder) without hyperactivity 12/29/2012   Arthritis    Asthma    Bipolar disorder (HCC)    Chronic back pain    Chronic neck pain    Constipation 09/05/2016   COPD (chronic obstructive pulmonary disease) (HCC)    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   DDD (degenerative disc disease), cervical    DDD (degenerative disc disease), lumbar    Depression    Diabetes mellitus (HCC) 06/27/2010   Diabetes mellitus, type II (HCC)    Gallbladder sludge    GERD (gastroesophageal reflux disease)    Hyperlipidemia    Hypertension    Iron deficiency anemia 09/05/2016   Lumbar radiculopathy    Mole (skin)    Neuropathy of foot    Renal cyst    Shingles    Skin lesions, generalized    1.2 CM FLAT FAWN COLOR AT THE T10 AREA JUST RIGHT OF SPINE     Assessment: Patient Reported Symptoms:  Cognitive Cognitive Status: No symptoms reported      Neurological Neurological Review of Symptoms: No symptoms reported Neurological Self-Management Outcome: 4 (good)  HEENT HEENT Symptoms Reported: No symptoms reported HEENT Self-Management Outcome: 4 (good)    Cardiovascular Cardiovascular Symptoms Reported: No symptoms reported Cardiovascular Management Strategies: Medication therapy, Routine screening Cardiovascular Self-Management Outcome: 4 (good)  Respiratory Respiratory Symptoms Reported: Not assesed    Endocrine Endocrine Symptoms Reported: Hunger, Blurry vision Is patient diabetic?: Yes Is patient checking blood sugars at home?:  No Endocrine Self-Management Outcome: 3 (uncertain)  Gastrointestinal Gastrointestinal Symptoms Reported: Abdominal pain or discomfort, Change in appetite, Cramping, Constipation, Nausea, Flatulence Gastrointestinal Management Strategies: Medication therapy, Fluid modification, Adequate rest Gastrointestinal Self-Management Outcome: 2 (bad) Gastrointestinal Comment: takes in V8    Genitourinary Genitourinary Symptoms Reported: No symptoms  reported Genitourinary Self-Management Outcome: 4 (good)  Integumentary Integumentary Symptoms Reported: No symptoms reported Skin Self-Management Outcome: 4 (good)  Musculoskeletal Musculoskelatal Symptoms Reviewed: Weakness Musculoskeletal Self-Management Outcome: 3 (uncertain)      Psychosocial Psychosocial Symptoms Reported: Alteration in eating habits, Sadness - if selected complete PHQ 2-9 Additional Psychological Details: spouse has dementia, concerns with walking Pt Behavioral Management Strategies: Medication therapy Behavioral Health Self-Management Outcome: 3 (uncertain)   Quality of Family Relationships: helpful, supportive Do you feel physically threatened by others?: No    01/01/2024    PHQ2-9 Depression Screening   Little interest or pleasure in doing things Several days  Feeling down, depressed, or hopeless Several days  PHQ-2 - Total Score 2  Trouble falling or staying asleep, or sleeping too much More than half the days  Feeling tired or having little energy More than half the days  Poor appetite or overeating  Several days  Feeling bad about yourself - or that you are a failure or have let yourself or your family down    Trouble concentrating on things, such as reading the newspaper or watching television    Moving or speaking so slowly that other people could have noticed.  Or the opposite - being so fidgety or restless that you have been moving around a lot more than usual    Thoughts that you would be better off dead, or hurting yourself in some way    PHQ2-9 Total Score 7  If you checked off any problems, how difficult have these problems made it for you to do your work, take care of things at home, or get along with other people    Depression Interventions/Treatment      There were no vitals filed for this visit.  Medications Reviewed Today   Medications were not reviewed in this encounter     Recommendation:   PCP Follow-up Continue Current Plan of  Care  Follow Up Plan:   Telephone follow up appointment date/time:  12/17/23 3 pm  Charlotte Perez L. Ramonita, RN, BSN, CCM [place]  [facility], [facility] RN Care Manager Direct Dial: [phone]  Fax: [phone]

## 2023-12-03 NOTE — Patient Outreach (Signed)
 Complex Care Management   Visit Note  12/03/2023  Name:  Charlotte Perez MRN: [id number] DOB: 1961/03/01  Situation: Referral received for Complex Care Management related to COPD, Diabetes with Complications, and hyperlipidemia I obtained verbal consent from Patient.  Visit completed with Patient  on the phone  Mrs Openshaw reports concern with not understanding or receiving information about the results of her recent imaging She voices concern after RN CM reviewed the impression of her imaging and encouraged her to go to review it in her my chart test results & MD notes. 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   DDD (degenerative disc disease), cervical    DDD (degenerative disc disease), lumbar    Depression    Diabetes mellitus (HCC) 06/27/2010   Diabetes mellitus, type II (HCC)    Gallbladder sludge    GERD (gastroesophageal reflux disease)    Hyperlipidemia    Hypertension    Iron deficiency anemia 09/05/2016   Lumbar radiculopathy    Mole (skin)    Neuropathy of foot    Renal cyst    Shingles    Skin lesions, generalized    1.2 CM FLAT FAWN COLOR AT THE T10 AREA JUST RIGHT OF SPINE     Assessment: Patient Reported Symptoms:  Cognitive Cognitive Status: Alert and oriented to person, place, and time, Normal speech and language skills Cognitive/Intellectual Conditions Management [RPT]: Behavior Disorders, Intellectual  Disability Behavior Disorders: bipolar, depression reading disability   Health Maintenance Behaviors: Annual physical exam, Sleep adequate Healing Pattern: Unsure Health Facilitated by: Rest, Pain control  Neurological Neurological Review of Symptoms: Weakness Neurological Management Strategies: Routine screening Neurological Self-Management Outcome: 3 (uncertain)  HEENT HEENT Symptoms Reported: No symptoms reported HEENT Management Strategies: Routine screening HEENT Self-Management Outcome: 4 (good)    Cardiovascular Cardiovascular Symptoms Reported: No symptoms reported Cardiovascular Management Strategies: Medical device, Medication therapy, Routine screening Cardiovascular Self-Management Outcome: 3 (uncertain)  Respiratory Respiratory Symptoms Reported: No symptoms reported Respiratory Management Strategies: Routine screening Respiratory Self-Management Outcome: 4 (good)  Endocrine Endocrine Symptoms Reported: Blurry vision, Hunger, Weakness or fatigue Is patient diabetic?: Yes Is patient checking blood sugars at home?: No Endocrine Self-Management Outcome: 3 (uncertain)  Gastrointestinal Gastrointestinal Symptoms Reported: Abdominal pain or discomfort, Change in appetite, Cramping      Genitourinary Genitourinary Symptoms Reported: Other Other Genitourinary Symptoms: history of frequent UTIs Genitourinary Management Strategies: Fluid modification Genitourinary Self-Management Outcome: 3 (uncertain)  Integumentary Integumentary Symptoms Reported: No symptoms reported Skin Management Strategies: Routine screening Skin Self-Management Outcome: 4 (good)  Musculoskeletal Musculoskelatal Symptoms Reviewed: Weakness, Other Other Musculoskeletal Symptoms: recent shoulder pain left Rotator Musculoskeletal Management Strategies: Routine screening, Medication therapy Musculoskeletal Self-Management Outcome: 3 (uncertain) Falls in the past year?: No Number of falls in past year: 1 or  less Was there an injury with Fall?: No Fall Risk Category Calculator: 0 Patient Fall Risk Level: Low Fall Risk Patient at  Risk for Falls Due to: Mental status change Fall risk Follow up: Falls evaluation completed  Psychosocial Psychosocial Symptoms Reported: Alteration in eating habits, Depression - if selected complete PHQ 2-9, Sadness - if selected complete PHQ 2-9 Behavioral Management Strategies: Activity, Adequate rest, Community resources, Coping strategies, Counseling, Medication therapy Behavioral Health Self-Management Outcome: 3 (uncertain) Major Change/Loss/Stressor/Fears (CP): Environment, Medical condition, family, Medical condition, self, Resources Techniques to [employer] with Loss/Stress/Change: Counseling, Diversional activities, Medication Quality of Family Relationships: helpful, supportive Do you feel physically threatened by others?: No    12/03/2023    PHQ2-9 Depression Screening   Little interest or pleasure in doing things Several days  Feeling down, depressed, or hopeless Several days  PHQ-2 - Total Score 2  Trouble falling or staying asleep, or sleeping too much More than half the days  Feeling tired or having little energy More than half the days  Poor appetite or overeating  Several days  Feeling bad about yourself - or that you are a failure or have let yourself or your family down Several days  Trouble concentrating on things, such as reading the newspaper or watching television Several days  Moving or speaking so slowly that other people could have noticed.  Or the opposite - being so fidgety or restless that you have been moving around a lot more than usual Not at all  Thoughts that you would be better off dead, or hurting yourself in some way Not at all  PHQ2-9 Total Score 9  If you checked off any problems, how difficult have these problems made it for you to do your work, take care of things at home, or get along with other people Somewhat difficult   Depression Interventions/Treatment Medication, Counseling, Currently on Treatment    There were no vitals filed for this visit.  Medications Reviewed Today   Medications were not reviewed in this encounter     Recommendation:   12/15/23 PCP Follow-up Specialty provider follow-up with her GI MD Referral to: ENT & sleep study status  Continue Current Plan of Care Further review of her recent 11/04/23 CT abdominal pelvis imaging is needed- discussion of hepatic steatosis, sigmoid diverticulosis, post op changes within the lower lumbar spine   Follow Up Plan:   Telephone follow up appointment date/time:  12/03/23  Suzen L. Ramonita, RN, BSN, CCM [place]  [facility], [facility] RN Care Manager Direct Dial: [phone]  Fax: [phone]

## 2023-12-03 NOTE — Telephone Encounter (Signed)
 Xray  copy needed  For Tens unit [facility] so insurance will approve  Phone number is [phone] I called there are 10 callers ahead of me I can not hold for that long for a fax number   I have called patient asked her for the fax number so we can send the xray report  She will call back with the fax number

## 2023-12-04 NOTE — Telephone Encounter (Signed)
 Spoke w/the pt, she stated that they told her to have you send the xrays to [email or web address], she didn't have a fax #

## 2023-12-04 NOTE — Telephone Encounter (Signed)
 I can't get it to send. That's not a valid email address  I called her  Told her that I can't get it to send and she can pick up the reports she can get them to them by mail perhaps   I put them in the front in pick up file

## 2023-12-05 DIAGNOSIS — Z780 Asymptomatic menopausal state: Secondary | ICD-10-CM | POA: Diagnosis not present

## 2023-12-05 DIAGNOSIS — M545 Low back pain, unspecified: Secondary | ICD-10-CM | POA: Diagnosis not present

## 2023-12-05 DIAGNOSIS — E119 Type 2 diabetes mellitus without complications: Secondary | ICD-10-CM | POA: Diagnosis not present

## 2023-12-05 DIAGNOSIS — Z79899 Other long term (current) drug therapy: Secondary | ICD-10-CM | POA: Diagnosis not present

## 2023-12-08 DIAGNOSIS — H40013 Open angle with borderline findings, low risk, bilateral: Secondary | ICD-10-CM | POA: Diagnosis not present

## 2023-12-08 DIAGNOSIS — H2513 Age-related nuclear cataract, bilateral: Secondary | ICD-10-CM | POA: Diagnosis not present

## 2023-12-08 DIAGNOSIS — E119 Type 2 diabetes mellitus without complications: Secondary | ICD-10-CM | POA: Diagnosis not present

## 2023-12-08 LAB — HM DIABETES EYE EXAM

## 2023-12-11 ENCOUNTER — Encounter (INDEPENDENT_AMBULATORY_CARE_PROVIDER_SITE_OTHER): Payer: Self-pay | Admitting: Gastroenterology

## 2023-12-11 ENCOUNTER — Telehealth (INDEPENDENT_AMBULATORY_CARE_PROVIDER_SITE_OTHER): Payer: Self-pay

## 2023-12-11 ENCOUNTER — Ambulatory Visit (INDEPENDENT_AMBULATORY_CARE_PROVIDER_SITE_OTHER): Admitting: Gastroenterology

## 2023-12-11 VITALS — BP 127/88 | HR 81 | Temp 97.1°F | Ht <= 58 in | Wt 117.7 lb

## 2023-12-11 DIAGNOSIS — K760 Fatty (change of) liver, not elsewhere classified: Secondary | ICD-10-CM

## 2023-12-11 DIAGNOSIS — K581 Irritable bowel syndrome with constipation: Secondary | ICD-10-CM

## 2023-12-11 DIAGNOSIS — K219 Gastro-esophageal reflux disease without esophagitis: Secondary | ICD-10-CM

## 2023-12-11 DIAGNOSIS — Z79899 Other long term (current) drug therapy: Secondary | ICD-10-CM | POA: Diagnosis not present

## 2023-12-11 DIAGNOSIS — R1013 Epigastric pain: Secondary | ICD-10-CM

## 2023-12-11 MED ORDER — DICYCLOMINE HCL 10 MG PO CAPS: 10.0000 mg | ORAL_CAPSULE | Freq: Three times a day (TID) | ORAL | 1 refills | Status: DC | PRN

## 2023-12-11 NOTE — Telephone Encounter (Signed)
 Spoke w/the pt, she stated this was the fax # for [employer] [phone] - she requested a cb to let her know if it goes through or not.

## 2023-12-11 NOTE — H&P (View-Only) (Signed)
 Referring Provider: Joesph Annabella HERO, FNP Primary Care Physician:  Joesph Annabella HERO, FNP Primary GI Physician: Dr. Eartha   Chief Complaint  Patient presents with   Abdominal Pain    Pt arrives for abdominal pain. Pt having pain in epigastric area and it is now going towards the right side. Pain has been going on since last month. CT scan completed 11/04/23. No nausea or vomiting.    HPI:   Charlotte Perez is a 63 y.o. female with past medical history of asthma, ADD, bipolar disorder, COPD, diabetes, depression, IBS-C, chronic opiate use   Patient presenting today for:  IBS GERD Epigastric pain Hepatic steatoiss   Last seen June, by DR. Castaneda, at that time having a BM every 3 days, maybe once a week sometimes. Taking linzess  145mcg daily. Reported epigastric pain at times, mild nausea, taking norco 2 pills BID PRN  Recommended continue omeprazole  40mg  daily, increase linzess  290mcg daily  Present: States ongoing epigastric pain traveling to the [place]. Went to the ED last month for pain and was told no acute findings. Pain is constant, pain is not changed with eating. No nausea or vomiting.  No early satiety, notes appetite is good. Denies heartburn or acid regurgitation, feels GERD is well managed on omeprazole  40mg  daily. Not taking anything else for pain. No radiation of pain into her back. She got carafate  in the ED without improvement in symptoms. She does note that she feels abdominal pain started flaring up when she started wellbutrin , so she stopped this. Denies any NSAIDs. GB is in situ. No rectal bleeding, melena. Constipation well controlled on linzess  , having 1-3 BMs per day. Denies any dysphagia or odynophagia. No weight loss.   CT A/P w contrast: 10/2023. Hepatic steatosis. 2. Sigmoid diverticulosis. 3. Postoperative changes within the lower lumbar spine.  Last labs on 8/11 with CMP, CBC both WNL, lipase 37     Last Colonoscopy: 07/21/2015, performed at  [facility] by Dr. Donnel.  Found to have small external and internal hemorrhoids, mild diverticulosis in the sigmoid colon.  Recommended to repeat in 10 years. Last Endoscopy:03/07/21 which showed normal esophagus, stomach and duodenum.  Biopsies from small bowel were within normal limits.  Gastric biopsies showed presence of gastrointestinal metaplasia and chronic atrophy gastritis but negative for H. pylori.  Recommended to have a repeat EGD in 3 years for gastric mapping.    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   DDD (degenerative disc disease), cervical    DDD (degenerative disc disease), lumbar    Depression    Diabetes mellitus (HCC) 06/27/2010   Diabetes mellitus, type II (HCC)    Gallbladder sludge    GERD (gastroesophageal reflux disease)    Hyperlipidemia    Hypertension    Iron deficiency anemia 09/05/2016   Lumbar radiculopathy    Mole (skin)    Neuropathy of foot    Renal cyst    Shingles    Skin lesions, generalized    1.2 CM FLAT FAWN COLOR AT THE T10 AREA JUST RIGHT OF SPINE     Past Surgical History:  Procedure Laterality Date   ABDOMINAL HYSTERECTOMY     total hysterectomy   BACK SURGERY     BIOPSY  03/07/2021   Procedure: BIOPSY;  Surgeon: Eartha Flavors, Toribio, MD;  Location: [facility];  Service: Gastroenterology;;   CESAREAN SECTION     x 2   ESOPHAGOGASTRODUODENOSCOPY (EGD) WITH PROPOFOL  N/A 03/07/2021   Procedure: ESOPHAGOGASTRODUODENOSCOPY (EGD) WITH PROPOFOL ;  Surgeon: Eartha Flavors Toribio, MD;  Location: [facility];  Service: Gastroenterology;  Laterality: N/A;  9:15   NECK SURGERY       Current Outpatient Medications  Medication Sig Dispense Refill   ACCU-CHEK FASTCLIX LANCETS MISC EVERY DAY 102 each 2   albuterol  (PROVENTIL ) (2.5 MG/3ML) 0.083% nebulizer solution Take 3 mLs (2.5 mg total) by nebulization every 6 (six) hours as needed for wheezing or shortness of breath. 75 mL 3   albuterol  (VENTOLIN  HFA) 108 (90 Base) MCG/ACT inhaler Inhale 2 puffs into the lungs every 6 (six) hours as needed for wheezing or shortness of breath. 8 g 0   ALPRAZolam  (XANAX ) 1 MG tablet Take 1 tablet (1 mg total) by mouth at bedtime as needed for anxiety. 30 tablet 2   atorvastatin  (LIPITOR) 40 MG tablet Take 1 tablet (40 mg total) by mouth daily. 90 tablet 3   Blood Glucose Monitoring Suppl (ACCU-CHEK GUIDE ME) w/Device KIT Use daily Dx E11.9 1 kit 0   budesonide-glycopyrrolate-formoterol (BREZTRI  AEROSPHERE) 160-9-4.8 MCG/ACT AERO inhaler Inhale 2 puffs into the lungs 2 (two) times daily. 10.7 g 11   clobetasol  ointment (TEMOVATE ) 0.05 % Pea-sized amount to affected area as needed- at most nightly 30 g 1   cycloSPORINE (RESTASIS) 0.05 % ophthalmic emulsion Place 1 drop into both eyes at bedtime.     dicyclomine  (BENTYL ) 10 MG capsule Take 1 capsule (10 mg total) by mouth 2 times daily at 12 noon and 4 pm. 60 capsule 2   empagliflozin  (JARDIANCE ) 25 MG TABS tablet Take 1 tablet (25 mg total) by mouth daily. 90 tablet 3   glucose blood (ACCU-CHEK GUIDE TEST) test strip Use daily Dx E11.9 100 each 3   HYDROcodone -acetaminophen  (NORCO) 10-325 MG tablet Take 1 tablet by mouth 3 (three) times daily as needed for pain.     latanoprost (XALATAN) 0.005 % ophthalmic solution Place 1 drop into both eyes at bedtime.     linaclotide  (LINZESS ) 290 MCG CAPS capsule Take 1 capsule (290 mcg total) by mouth daily before breakfast. 90 capsule 3   lisinopril  (ZESTRIL ) 5 MG tablet Take 0.5 tablets (2.5 mg total) by mouth daily. 90 tablet 3   meclizine (ANTIVERT) 25 MG tablet Take 50 mg by mouth 2 (two) times  daily.     metFORMIN  (GLUCOPHAGE ) 1000 MG tablet Take 1 tablet (1,000 mg total) by mouth 2 (two) times daily with a meal. 180 tablet 3   venlafaxine  XR (EFFEXOR -XR) 150 MG 24 hr capsule Take 1 capsule (150 mg total) by mouth at bedtime. 60 capsule 2   buPROPion  (WELLBUTRIN  XL) 150 MG 24 hr tablet Take 1 tablet (150 mg total) by mouth every morning. (Patient not taking: Reported on 12/11/2023) 30 tablet 2   Dulaglutide  (TRULICITY ) 1.5 MG/0.5ML SOAJ Inject 1.5 mg into the skin once a week. (Patient not taking: Reported on 12/11/2023) 6 mL 3   ondansetron  (ZOFRAN ) 4 MG tablet Take 1 tablet (4 mg total) by mouth every 8 (eight) hours as needed for nausea or vomiting. (Patient not taking: Reported on 12/11/2023) 20 tablet 0  pantoprazole  (PROTONIX ) 40 MG tablet Take 1 tablet (40 mg total) by mouth daily. (Patient not taking: Reported on 12/11/2023) 90 tablet 3   pregabalin  (LYRICA ) 150 MG capsule Take 1 capsule (150 mg total) by mouth 2 (two) times daily. (Patient not taking: Reported on 12/11/2023) 60 capsule 2   sucralfate  (CARAFATE ) 1 g tablet Take 1 tablet (1 g total) by mouth 4 (four) times daily -  with meals and at bedtime. (Patient not taking: Reported on 12/11/2023) 40 tablet 0   No current facility-administered medications for this visit.    Allergies as of 12/11/2023 - Review Complete 12/11/2023  Allergen Reaction Noted   Cymbalta  [duloxetine  hcl]  07/27/2014   Gabapentin  06/27/2010   Meloxicam  Other (See Comments) 12/26/2014   Tramadol  Other (See Comments) 12/26/2014    Social History   Socioeconomic History   Marital status: Legally Separated    Spouse name: Ronnie   Number of children: 3   Years of education: 9   Highest education level: 9th grade  Occupational History   Occupation: disablility  Tobacco Use   Smoking status: Never    Passive exposure: Yes   Smokeless tobacco: Never  Vaping Use   Vaping status: Never Used  Substance and Sexual Activity   Alcohol use: No    Drug use: No   Sexual activity: Not Currently    Birth control/protection: Surgical    Comment: hyst  Other Topics Concern   Not on file  Social History Narrative   Not on file   Social Drivers of Health   Financial Resource Strain: Medium Risk (07/10/2022)   Overall Financial Resource Strain (CARDIA)    Difficulty of Paying Living Expenses: Somewhat hard  Food Insecurity: Food Insecurity Present (07/10/2022)   Hunger Vital Sign    Worried About Running Out of Food in the Last Year: Often true    Ran Out of Food in the Last Year: Sometimes true  Transportation Needs: Unmet Transportation Needs (07/10/2022)   PRAPARE - Transportation    Lack of Transportation (Medical): No    Lack of Transportation (Non-Medical): Yes  Physical Activity: Insufficiently Active (07/10/2022)   Exercise Vital Sign    Days of Exercise per Week: 2 days    Minutes of Exercise per Session: 20 min  Stress: Stress Concern Present (07/10/2022)   [employer] of Occupational Health - Occupational Stress Questionnaire    Feeling of Stress : To some extent  Social Connections: Socially Isolated (07/10/2022)   Social Connection and Isolation Panel    Frequency of Communication with Friends and Family: Once a week    Frequency of Social Gatherings with Friends and Family: Never    Attends Religious Services: Never    Diplomatic Services operational officer: No    Attends Engineer, structural: Never    Marital Status: Married    Review of systems General: negative for malaise, night sweats, fever, chills, weight loss Neck: Negative for lumps, goiter, pain and significant neck swelling Resp: Negative for cough, wheezing, dyspnea at rest CV: Negative for chest pain, leg swelling, palpitations, orthopnea GI: denies melena, hematochezia, nausea, vomiting, diarrhea, constipation, dysphagia, odyonophagia, early satiety or unintentional weight loss. +epigastric pain  MSK: Negative for joint pain or  swelling, back pain, and muscle pain. Derm: Negative for itching or rash Psych: Denies depression, anxiety, memory loss, confusion. No homicidal or suicidal ideation.  Heme: Negative for prolonged bleeding, bruising easily, and swollen nodes. Endocrine: Negative for cold or heat intolerance,  polyuria, polydipsia and goiter. Neuro: negative for tremor, gait imbalance, syncope and seizures. The remainder of the review of systems is noncontributory.  Physical Exam: BP 127/88   Pulse 81   Temp (!) 97.1 F (36.2 C)   Ht 4' 10 (1.473 m)   Wt 117 lb 11.2 oz (53.4 kg)   BMI 24.60 kg/m  General:   Alert and oriented. No distress noted. Pleasant and cooperative.  Head:  Normocephalic and atraumatic. Eyes:  Conjuctiva clear without scleral icterus. Mouth:  Oral mucosa pink and moist. Good dentition. No lesions. Heart: Normal rate and rhythm, s1 and s2 heart sounds present.  Lungs: Clear lung sounds in all lobes. Respirations equal and unlabored. Abdomen:  +BS, soft, and non-distended. Upper abdomen is diffusely tender to palpation. No rebound or guarding. No HSM or masses noted. Derm: No palmar erythema or jaundice Msk:  Symmetrical without gross deformities. Normal posture. Extremities:  Without edema. Neurologic:  Alert and  oriented x4 Psych:  Alert and cooperative. Normal mood and affect.  Invalid input(s): 6 MONTHS   ASSESSMENT: Charlotte Perez is a 63 y.o. female presenting today for follow up of IBS, GERD, hepatic steatosis, and epigastric pain  IBS: constipation well managed on linzess  290mcg daily, having 1-3 BMs per day. No rectal bleeding or melena. Will continue with current regimen  GERD/epigastric pain: GERD seems well controlled on omeprazole  40mg  daily. She notes worsening of epigastric pain. Recent ED visit due to significant pain with CT imaging that was unremarkable other than findings of hepatic steatosis. She notes epigastric pain is constant, not influenced by  eating, no nausea, vomiting or radiation to her back. Last EGD in 2022 as outlined above. Differentials include functional abdominal pain as she has had epigastric pain chronically in the past though symptoms appear worse recently, cannot rule out PUD, gastritis, duodenitis, esophagitis, low suspicion for malignancy. Would recommend updating EGD for further evaluation. As she had course of carafate  from the ED without improvement in pain, will try bentyl  10mg  to see if this provides any relief. If EGD is unremarkable, may consider trial of TCA as this could be a component of her IBS/functional abdominal pain.   Hepatic steatosis: as noted on recent CT in the ED. We discussed implications of fatty liver and importance of liver healthy diet, avoiding alcohol and good weight management. Her LFTs are WNL.    PLAN:  -schedule EGD ASA II -continue omeprazole  40mg  daily -bentyl  10mg  TID PRN -continue linzess  290mcg daily  -mediterranean diet -good weight management with atleast 30 minutes physical activity 4-5 x/week -avoid ETOH  All questions were answered, patient verbalized understanding and is in agreement with plan as outlined above.   Follow Up: 3 months   Jarquez Mestre L. Mariette, MSN, APRN, AGNP-C Adult-Gerontology Nurse Practitioner [facility]  I have reviewed the note and agree with the APP's assessment as described in this progress note  Toribio Fortune, MD Gastroenterology and Hepatology [facility]astroenterology

## 2023-12-11 NOTE — Telephone Encounter (Signed)
 Spoke with patient, scheduled egd for 12/24/2023 at 9:45am. Instructions given to patient in person.   PA pending: tracking #: [id number]

## 2023-12-11 NOTE — Patient Instructions (Signed)
 We will get you scheduled for upper endoscopy Continue omeprazole  40mg  daily I have sent bentyl  10mg  to take up to three times per day to see if this helps with your pain  In regards to fatty liver noted on recent ultrasound I am providing the mediterranean diet, this is a guideline to help you know which foods you should eat and those you should try to limit or avoid. It is important to make sure you are getting atleast 30 minutes of exercise 4-5x/week You should aim for 5-7% decrease in overall weight. Fatty liver is usually well managed with dietary and lifestyle modifications, however, in rare cases, this can progress to fibrosis/cirrhosis of the liver, which is serious.  Please avoid alcohol as this can worsen liver issues.   Follow up 3 months  It was a pleasure to see you today. I want to create trusting relationships with patients and provide genuine, compassionate, and quality care. I truly value your feedback! please be on the lookout for a survey regarding your visit with me today. I appreciate your input about our visit and your time in completing this!    Clete Kuch L. Yenny Kosa, MSN, APRN, AGNP-C Adult-Gerontology Nurse Practitioner [facility] at [facility]

## 2023-12-11 NOTE — Progress Notes (Addendum)
 Referring Provider: Joesph Annabella HERO, FNP Primary Care Physician:  Joesph Annabella HERO, FNP Primary GI Physician: Dr. Eartha   Chief Complaint  Patient presents with   Abdominal Pain    Pt arrives for abdominal pain. Pt having pain in epigastric area and it is now going towards the right side. Pain has been going on since last month. CT scan completed 11/04/23. No nausea or vomiting.    HPI:   Charlotte Perez is a 63 y.o. female with past medical history of asthma, ADD, bipolar disorder, COPD, diabetes, depression, IBS-C, chronic opiate use   Patient presenting today for:  IBS GERD Epigastric pain Hepatic steatoiss   Last seen June, by DR. Castaneda, at that time having a BM every 3 days, maybe once a week sometimes. Taking linzess  145mcg daily. Reported epigastric pain at times, mild nausea, taking norco 2 pills BID PRN  Recommended continue omeprazole  40mg  daily, increase linzess  290mcg daily  Present: States ongoing epigastric pain traveling to the [place]. Went to the ED last month for pain and was told no acute findings. Pain is constant, pain is not changed with eating. No nausea or vomiting.  No early satiety, notes appetite is good. Denies heartburn or acid regurgitation, feels GERD is well managed on omeprazole  40mg  daily. Not taking anything else for pain. No radiation of pain into her back. She got carafate  in the ED without improvement in symptoms. She does note that she feels abdominal pain started flaring up when she started wellbutrin , so she stopped this. Denies any NSAIDs. GB is in situ. No rectal bleeding, melena. Constipation well controlled on linzess  , having 1-3 BMs per day. Denies any dysphagia or odynophagia. No weight loss.   CT A/P w contrast: 10/2023. Hepatic steatosis. 2. Sigmoid diverticulosis. 3. Postoperative changes within the lower lumbar spine.  Last labs on 8/11 with CMP, CBC both WNL, lipase 37     Last Colonoscopy: 07/21/2015, performed at  [facility] by Dr. Donnel.  Found to have small external and internal hemorrhoids, mild diverticulosis in the sigmoid colon.  Recommended to repeat in 10 years. Last Endoscopy:03/07/21 which showed normal esophagus, stomach and duodenum.  Biopsies from small bowel were within normal limits.  Gastric biopsies showed presence of gastrointestinal metaplasia and chronic atrophy gastritis but negative for H. pylori.  Recommended to have a repeat EGD in 3 years for gastric mapping.    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   DDD (degenerative disc disease), cervical    DDD (degenerative disc disease), lumbar    Depression    Diabetes mellitus (HCC) 06/27/2010   Diabetes mellitus, type II (HCC)    Gallbladder sludge    GERD (gastroesophageal reflux disease)    Hyperlipidemia    Hypertension    Iron deficiency anemia 09/05/2016   Lumbar radiculopathy    Mole (skin)    Neuropathy of foot    Renal cyst    Shingles    Skin lesions, generalized    1.2 CM FLAT FAWN COLOR AT THE T10 AREA JUST RIGHT OF SPINE     Past Surgical History:  Procedure Laterality Date   ABDOMINAL HYSTERECTOMY     total hysterectomy   BACK SURGERY     BIOPSY  03/07/2021   Procedure: BIOPSY;  Surgeon: Eartha Flavors, Toribio, MD;  Location: [facility];  Service: Gastroenterology;;   CESAREAN SECTION     x 2   ESOPHAGOGASTRODUODENOSCOPY (EGD) WITH PROPOFOL  N/A 03/07/2021   Procedure: ESOPHAGOGASTRODUODENOSCOPY (EGD) WITH PROPOFOL ;  Surgeon: Eartha Flavors Toribio, MD;  Location: [facility];  Service: Gastroenterology;  Laterality: N/A;  9:15   NECK SURGERY       Current Outpatient Medications  Medication Sig Dispense Refill   ACCU-CHEK FASTCLIX LANCETS MISC EVERY DAY 102 each 2   albuterol  (PROVENTIL ) (2.5 MG/3ML) 0.083% nebulizer solution Take 3 mLs (2.5 mg total) by nebulization every 6 (six) hours as needed for wheezing or shortness of breath. 75 mL 3   albuterol  (VENTOLIN  HFA) 108 (90 Base) MCG/ACT inhaler Inhale 2 puffs into the lungs every 6 (six) hours as needed for wheezing or shortness of breath. 8 g 0   ALPRAZolam  (XANAX ) 1 MG tablet Take 1 tablet (1 mg total) by mouth at bedtime as needed for anxiety. 30 tablet 2   atorvastatin  (LIPITOR) 40 MG tablet Take 1 tablet (40 mg total) by mouth daily. 90 tablet 3   Blood Glucose Monitoring Suppl (ACCU-CHEK GUIDE ME) w/Device KIT Use daily Dx E11.9 1 kit 0   budesonide-glycopyrrolate-formoterol (BREZTRI  AEROSPHERE) 160-9-4.8 MCG/ACT AERO inhaler Inhale 2 puffs into the lungs 2 (two) times daily. 10.7 g 11   clobetasol  ointment (TEMOVATE ) 0.05 % Pea-sized amount to affected area as needed- at most nightly 30 g 1   cycloSPORINE (RESTASIS) 0.05 % ophthalmic emulsion Place 1 drop into both eyes at bedtime.     dicyclomine  (BENTYL ) 10 MG capsule Take 1 capsule (10 mg total) by mouth 2 times daily at 12 noon and 4 pm. 60 capsule 2   empagliflozin  (JARDIANCE ) 25 MG TABS tablet Take 1 tablet (25 mg total) by mouth daily. 90 tablet 3   glucose blood (ACCU-CHEK GUIDE TEST) test strip Use daily Dx E11.9 100 each 3   HYDROcodone -acetaminophen  (NORCO) 10-325 MG tablet Take 1 tablet by mouth 3 (three) times daily as needed for pain.     latanoprost (XALATAN) 0.005 % ophthalmic solution Place 1 drop into both eyes at bedtime.     linaclotide  (LINZESS ) 290 MCG CAPS capsule Take 1 capsule (290 mcg total) by mouth daily before breakfast. 90 capsule 3   lisinopril  (ZESTRIL ) 5 MG tablet Take 0.5 tablets (2.5 mg total) by mouth daily. 90 tablet 3   meclizine (ANTIVERT) 25 MG tablet Take 50 mg by mouth 2 (two) times  daily.     metFORMIN  (GLUCOPHAGE ) 1000 MG tablet Take 1 tablet (1,000 mg total) by mouth 2 (two) times daily with a meal. 180 tablet 3   venlafaxine  XR (EFFEXOR -XR) 150 MG 24 hr capsule Take 1 capsule (150 mg total) by mouth at bedtime. 60 capsule 2   buPROPion  (WELLBUTRIN  XL) 150 MG 24 hr tablet Take 1 tablet (150 mg total) by mouth every morning. (Patient not taking: Reported on 12/11/2023) 30 tablet 2   Dulaglutide  (TRULICITY ) 1.5 MG/0.5ML SOAJ Inject 1.5 mg into the skin once a week. (Patient not taking: Reported on 12/11/2023) 6 mL 3   ondansetron  (ZOFRAN ) 4 MG tablet Take 1 tablet (4 mg total) by mouth every 8 (eight) hours as needed for nausea or vomiting. (Patient not taking: Reported on 12/11/2023) 20 tablet 0  pantoprazole  (PROTONIX ) 40 MG tablet Take 1 tablet (40 mg total) by mouth daily. (Patient not taking: Reported on 12/11/2023) 90 tablet 3   pregabalin  (LYRICA ) 150 MG capsule Take 1 capsule (150 mg total) by mouth 2 (two) times daily. (Patient not taking: Reported on 12/11/2023) 60 capsule 2   sucralfate  (CARAFATE ) 1 g tablet Take 1 tablet (1 g total) by mouth 4 (four) times daily -  with meals and at bedtime. (Patient not taking: Reported on 12/11/2023) 40 tablet 0   No current facility-administered medications for this visit.    Allergies as of 12/11/2023 - Review Complete 12/11/2023  Allergen Reaction Noted   Cymbalta  [duloxetine  hcl]  07/27/2014   Gabapentin  06/27/2010   Meloxicam  Other (See Comments) 12/26/2014   Tramadol  Other (See Comments) 12/26/2014    Social History   Socioeconomic History   Marital status: Legally Separated    Spouse name: Ronnie   Number of children: 3   Years of education: 9   Highest education level: 9th grade  Occupational History   Occupation: disablility  Tobacco Use   Smoking status: Never    Passive exposure: Yes   Smokeless tobacco: Never  Vaping Use   Vaping status: Never Used  Substance and Sexual Activity   Alcohol use: No    Drug use: No   Sexual activity: Not Currently    Birth control/protection: Surgical    Comment: hyst  Other Topics Concern   Not on file  Social History Narrative   Not on file   Social Drivers of Health   Financial Resource Strain: Medium Risk (07/10/2022)   Overall Financial Resource Strain (CARDIA)    Difficulty of Paying Living Expenses: Somewhat hard  Food Insecurity: Food Insecurity Present (07/10/2022)   Hunger Vital Sign    Worried About Running Out of Food in the Last Year: Often true    Ran Out of Food in the Last Year: Sometimes true  Transportation Needs: Unmet Transportation Needs (07/10/2022)   PRAPARE - Transportation    Lack of Transportation (Medical): No    Lack of Transportation (Non-Medical): Yes  Physical Activity: Insufficiently Active (07/10/2022)   Exercise Vital Sign    Days of Exercise per Week: 2 days    Minutes of Exercise per Session: 20 min  Stress: Stress Concern Present (07/10/2022)   [employer] of Occupational Health - Occupational Stress Questionnaire    Feeling of Stress : To some extent  Social Connections: Socially Isolated (07/10/2022)   Social Connection and Isolation Panel    Frequency of Communication with Friends and Family: Once a week    Frequency of Social Gatherings with Friends and Family: Never    Attends Religious Services: Never    Diplomatic Services operational officer: No    Attends Engineer, structural: Never    Marital Status: Married    Review of systems General: negative for malaise, night sweats, fever, chills, weight loss Neck: Negative for lumps, goiter, pain and significant neck swelling Resp: Negative for cough, wheezing, dyspnea at rest CV: Negative for chest pain, leg swelling, palpitations, orthopnea GI: denies melena, hematochezia, nausea, vomiting, diarrhea, constipation, dysphagia, odyonophagia, early satiety or unintentional weight loss. +epigastric pain  MSK: Negative for joint pain or  swelling, back pain, and muscle pain. Derm: Negative for itching or rash Psych: Denies depression, anxiety, memory loss, confusion. No homicidal or suicidal ideation.  Heme: Negative for prolonged bleeding, bruising easily, and swollen nodes. Endocrine: Negative for cold or heat intolerance,  polyuria, polydipsia and goiter. Neuro: negative for tremor, gait imbalance, syncope and seizures. The remainder of the review of systems is noncontributory.  Physical Exam: BP 127/88   Pulse 81   Temp (!) 97.1 F (36.2 C)   Ht 4' 10 (1.473 m)   Wt 117 lb 11.2 oz (53.4 kg)   BMI 24.60 kg/m  General:   Alert and oriented. No distress noted. Pleasant and cooperative.  Head:  Normocephalic and atraumatic. Eyes:  Conjuctiva clear without scleral icterus. Mouth:  Oral mucosa pink and moist. Good dentition. No lesions. Heart: Normal rate and rhythm, s1 and s2 heart sounds present.  Lungs: Clear lung sounds in all lobes. Respirations equal and unlabored. Abdomen:  +BS, soft, and non-distended. Upper abdomen is diffusely tender to palpation. No rebound or guarding. No HSM or masses noted. Derm: No palmar erythema or jaundice Msk:  Symmetrical without gross deformities. Normal posture. Extremities:  Without edema. Neurologic:  Alert and  oriented x4 Psych:  Alert and cooperative. Normal mood and affect.  Invalid input(s): 6 MONTHS   ASSESSMENT: Charlotte Perez is a 63 y.o. female presenting today for follow up of IBS, GERD, hepatic steatosis, and epigastric pain  IBS: constipation well managed on linzess  290mcg daily, having 1-3 BMs per day. No rectal bleeding or melena. Will continue with current regimen  GERD/epigastric pain: GERD seems well controlled on omeprazole  40mg  daily. She notes worsening of epigastric pain. Recent ED visit due to significant pain with CT imaging that was unremarkable other than findings of hepatic steatosis. She notes epigastric pain is constant, not influenced by  eating, no nausea, vomiting or radiation to her back. Last EGD in 2022 as outlined above. Differentials include functional abdominal pain as she has had epigastric pain chronically in the past though symptoms appear worse recently, cannot rule out PUD, gastritis, duodenitis, esophagitis, low suspicion for malignancy. Would recommend updating EGD for further evaluation. As she had course of carafate  from the ED without improvement in pain, will try bentyl  10mg  to see if this provides any relief. If EGD is unremarkable, may consider trial of TCA as this could be a component of her IBS/functional abdominal pain.   Hepatic steatosis: as noted on recent CT in the ED. We discussed implications of fatty liver and importance of liver healthy diet, avoiding alcohol and good weight management. Her LFTs are WNL.    PLAN:  -schedule EGD ASA II -continue omeprazole  40mg  daily -bentyl  10mg  TID PRN -continue linzess  290mcg daily  -mediterranean diet -good weight management with atleast 30 minutes physical activity 4-5 x/week -avoid ETOH  All questions were answered, patient verbalized understanding and is in agreement with plan as outlined above.   Follow Up: 3 months   Jarquez Mestre L. Mariette, MSN, APRN, AGNP-C Adult-Gerontology Nurse Practitioner [facility]  I have reviewed the note and agree with the APP's assessment as described in this progress note  Toribio Fortune, MD Gastroenterology and Hepatology [facility] Gastroenterology

## 2023-12-15 ENCOUNTER — Encounter: Payer: Self-pay | Admitting: Family Medicine

## 2023-12-15 ENCOUNTER — Ambulatory Visit: Admitting: Family Medicine

## 2023-12-15 VITALS — BP 129/76 | HR 68 | Temp 97.3°F | Ht <= 58 in | Wt 117.6 lb

## 2023-12-15 DIAGNOSIS — K219 Gastro-esophageal reflux disease without esophagitis: Secondary | ICD-10-CM

## 2023-12-15 DIAGNOSIS — I152 Hypertension secondary to endocrine disorders: Secondary | ICD-10-CM | POA: Diagnosis not present

## 2023-12-15 DIAGNOSIS — E1169 Type 2 diabetes mellitus with other specified complication: Secondary | ICD-10-CM

## 2023-12-15 DIAGNOSIS — Z23 Encounter for immunization: Secondary | ICD-10-CM

## 2023-12-15 DIAGNOSIS — Z7985 Long-term (current) use of injectable non-insulin antidiabetic drugs: Secondary | ICD-10-CM | POA: Diagnosis not present

## 2023-12-15 DIAGNOSIS — F411 Generalized anxiety disorder: Secondary | ICD-10-CM

## 2023-12-15 DIAGNOSIS — E1142 Type 2 diabetes mellitus with diabetic polyneuropathy: Secondary | ICD-10-CM | POA: Diagnosis not present

## 2023-12-15 DIAGNOSIS — K581 Irritable bowel syndrome with constipation: Secondary | ICD-10-CM

## 2023-12-15 DIAGNOSIS — Z7984 Long term (current) use of oral hypoglycemic drugs: Secondary | ICD-10-CM

## 2023-12-15 DIAGNOSIS — J4489 Other specified chronic obstructive pulmonary disease: Secondary | ICD-10-CM

## 2023-12-15 DIAGNOSIS — E1159 Type 2 diabetes mellitus with other circulatory complications: Secondary | ICD-10-CM | POA: Diagnosis not present

## 2023-12-15 DIAGNOSIS — G8929 Other chronic pain: Secondary | ICD-10-CM

## 2023-12-15 DIAGNOSIS — R1013 Epigastric pain: Secondary | ICD-10-CM | POA: Diagnosis not present

## 2023-12-15 LAB — BAYER DCA HB A1C WAIVED: HB A1C (BAYER DCA - WAIVED): 7.1 % — ABNORMAL HIGH (ref 4.8–5.6)

## 2023-12-15 MED ORDER — TRULICITY 0.75 MG/0.5ML ~~LOC~~ SOAJ: 0.7500 mg | SUBCUTANEOUS | 3 refills | Status: AC

## 2023-12-15 NOTE — Progress Notes (Signed)
 Established Patient Office Visit  Subjective   Patient ID: Charlotte Perez, female    DOB: 25-Aug-1960  Age: 63 y.o. MRN: [id number]  Chief Complaint  Patient presents with   Medical Management of Chronic Issues    HPI  History of Present Illness   Charlotte Perez is a 63 year old female with type 2 diabetes who presents for follow-up of her diabetes management and abdominal symptoms.  Glycemic control - Type 2 diabetes mellitus managed with Jardiance  and metformin  - Blood glucose levels fluctuate between 140 and 153 mg/dL - Recent spike to 798 mg/dL after a high-carbohydrate meal - She stopped Trulicity  to see if this would relive some of her GI symptoms. It did not. She would like to restart this. Has not had Trulicity  in 5-6 weeks.   Gastrointestinal symptoms - Bloating and abdominal pain, constipation, nausea - Increased appetite and bloating since stopping Trulicity  - Acid reflux symptoms remain stable with omeprazole  - Variable bowel movements - Linzess  and Bentyl  10 mg three times daily for suspected irritable bowel syndrome per GI, with partial pain relief - Has upcoming EGD next week  Mood and sleep disturbances - Depression and anxiety symptoms are variable - Difficulty with motivation and sleep - Recently discontinued Wellbutrin  without significant change in symptoms - Established with [facility] for management.  - Denies SI   COPD/asthma -Stable - Compliant with breztri    HTN - Complaint with lisinopril  - Denies chest pain, shortness of breath, edema          12/15/2023    8:55 AM 12/03/2023    8:42 PM 12/03/2023    5:14 PM  Depression screen PHQ 2/9  Decreased Interest 3 1 1   Down, Depressed, Hopeless 2 1 1   PHQ - 2 Score 5 2 2   Altered sleeping 2 2 2   Tired, decreased energy 3 2 2   Change in appetite 2 1 1   Feeling bad or failure about yourself  1 1   Trouble concentrating 2 1   Moving slowly or fidgety/restless 0 0   Suicidal thoughts 0 0    PHQ-9 Score 15 9 7   Difficult doing work/chores Somewhat difficult Somewhat difficult       12/15/2023    8:56 AM 11/07/2023   10:59 AM 10/22/2023    3:05 PM 09/12/2023   10:15 AM  GAD 7 : Generalized Anxiety Score  Nervous, Anxious, on Edge 1 1 2 3   Control/stop worrying 0 2 2 2   Worry too much - different things 2 2 1 1   Trouble relaxing 1 3 2 3   Restless 1 1 0 0  Easily annoyed or irritable 0 1 0 1  Afraid - awful might happen 0 0 0 0  Total GAD 7 Score 5 10 7 10   Anxiety Difficulty Somewhat difficult Somewhat difficult Somewhat difficult Somewhat difficult     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    Objective:     BP 129/76   Pulse 68   Temp (!) 97.3 F (36.3 C) (Temporal)   Ht 4' 10 (1.473 m)   Wt 117 lb 9.6 oz (53.3 kg)   SpO2 98%   BMI 24.58 kg/m  BP Readings from Last 3 Encounters:  12/15/23 129/76  12/11/23 127/88  11/07/23 118/74   Wt Readings from Last 3 Encounters:  12/15/23 117 lb 9.6 oz (53.3 kg)  12/11/23 117 lb 11.2 oz (53.4 kg)  11/07/23 111 lb 6.4 oz (50.5 kg)   No data found.   Physical Exam Vitals and nursing note reviewed.  Constitutional:       General: She is not in acute distress.    Appearance: Normal appearance. She is not ill-appearing, toxic-appearing or diaphoretic.  Cardiovascular:     Rate and Rhythm: Normal rate and regular rhythm.     Pulses: Normal pulses.     Heart sounds: Normal heart sounds. No murmur heard. Pulmonary:     Effort: Pulmonary effort is normal. No respiratory distress.     Breath sounds: Normal breath sounds.  Abdominal:     General: Bowel sounds are normal.     Palpations: Abdomen is soft.     Tenderness: There is generalized abdominal tenderness. There is no guarding or rebound.  Musculoskeletal:     Cervical back: No edema. No spinous process tenderness or muscular tenderness.     Right lower leg: No edema.     Left lower leg: No edema.  Skin:    General: Skin is warm and dry.  Neurological:     General: No focal deficit present.     Mental Status: She is alert and oriented to person, place, and time.  Psychiatric:        Mood and Affect: Mood normal.        Behavior: Behavior normal.        Thought Content: Thought content normal.        Judgment: Judgment normal.    No results found for any visits on 12/15/23.   The 10-year ASCVD risk score (Arnett DK, et al., 2019) is: 9.2%    Assessment & Plan:  Charlotte Perez was seen today for medical management of chronic issues.  Diagnoses and all orders for this visit:  Type 2 diabetes mellitus with diabetic polyneuropathy, without long-term current use of insulin (HCC) -     Bayer DCA Hb A1c Waived -     Dulaglutide  (TRULICITY ) 0.75 MG/0.5ML SOAJ; Inject 0.75 mg into the skin once a week.  Long term current use of oral hypoglycemic drug  Long-term current use of injectable noninsulin antidiabetic medication  Hypertension associated with diabetes (HCC)  Hyperlipidemia associated with type 2 diabetes mellitus (HCC)  COPD with asthma (HCC)  GAD (generalized anxiety disorder)  Gastroesophageal reflux disease without  esophagitis  Abdominal pain, chronic, epigastric  Irritable bowel syndrome with constipation  Encounter for immunization -     Flu vaccine trivalent PF, 6mos and older(Flulaval,Afluria,Fluarix,Fluzone)   Assessment and Plan    Chronic epigastric abdominal pain Persistent epigastric  pain with bloating - Continue follow up and recommendations per GI  Gastroesophageal reflux disease GERD stable on omeprazole  40 mg.   Type 2 diabetes mellitus A1c 7.1%. Not at goal of <7. - Restart Trulicity  0.75 mg after EGD. - Continue Jardiance  and metformin . - Monitor blood glucose levels.  Major depressive disorder and generalized anxiety disorder Depression symptoms more significant than anxiety. Stopped Wellbutrin  oer patient.  - Consider restarting Wellbutrin . - Follow up with Dr. Okey on October 10th.   HTN BP at goal   HLD On statin   COPD with asthma Well controlled.        Return in about 3 months (around 03/15/2024) for chronic follow up.   The patient indicates understanding of these issues and agrees with the plan.  Charlotte CHRISTELLA Search, FNP

## 2023-12-17 ENCOUNTER — Other Ambulatory Visit: Payer: Self-pay | Admitting: *Deleted

## 2023-12-17 NOTE — Patient Outreach (Signed)
 Complex Care Management   Visit Note  12/17/2023  Name:  Charlotte Perez MRN: [id number] DOB: [birth year]  Situation: Referral received for Complex Care Management related to COPD, Diabetes with Complications, and asthma, hyperglycemia, neuropathy, hyperlipidemia I obtained verbal consent from Patient.  Visit completed with Patient  on the phone  Charlotte Perez reports she is getting prepared for her upcoming endoscopy. She has her pre op instructions.  She continues to have abdominal pain. 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   DDD (degenerative disc disease), cervical    DDD (degenerative disc disease), lumbar    Depression    Diabetes mellitus (HCC) 06/27/2010   Diabetes mellitus, type II (HCC)    Gallbladder sludge    GERD (gastroesophageal reflux disease)    Hyperlipidemia    Hypertension    Iron deficiency anemia 09/05/2016   Lumbar radiculopathy    Mole (skin)    Neuropathy of foot    Renal cyst    Shingles    Skin lesions, generalized    1.2 CM FLAT FAWN COLOR AT THE T10 AREA JUST RIGHT OF SPINE     Assessment: Patient Reported Symptoms:  Cognitive Cognitive Status: No symptoms reported      Neurological Neurological Review of Symptoms: Weakness Neurological Management Strategies: Routine screening Neurological Self-Management Outcome: 3 (uncertain)  HEENT HEENT Symptoms Reported: No symptoms reported HEENT Management Strategies: Routine screening HEENT Self-Management Outcome: 4 (good)    Cardiovascular Cardiovascular Symptoms Reported: No symptoms reported Does patient have uncontrolled Hypertension?: No Cardiovascular  Management Strategies: Medical device, Medication therapy, Routine screening Cardiovascular Self-Management Outcome: 3 (uncertain)  Respiratory Respiratory Symptoms Reported: No symptoms reported Respiratory Management Strategies: Routine screening Respiratory Self-Management Outcome: 4 (good)  Endocrine Is patient diabetic?: Yes Is patient checking blood sugars at home?: Yes List most recent blood sugar readings, include date and time of day: 130-153 recently last hgb 7.1 Endocrine Self-Management Outcome: 3 (uncertain)  Gastrointestinal Gastrointestinal Symptoms Reported: Abdominal pain or discomfort, Change in appetite, Cramping, Constipation, Nausea, Flatulence Gastrointestinal Management Strategies: Adequate rest, Fluid modification, Medication therapy Gastrointestinal Self-Management Outcome: 2 (bad)    Genitourinary Genitourinary Symptoms Reported: Other Other Genitourinary Symptoms: hx UTIs Genitourinary Management Strategies: Adequate rest, Fluid modification, Medication therapy Genitourinary Self-Management Outcome: 3 (uncertain)  Integumentary Integumentary Symptoms Reported: No symptoms reported Skin Management Strategies: Routine screening Skin Self-Management Outcome: 4 (good)  Musculoskeletal Musculoskelatal Symptoms Reviewed: Weakness Musculoskeletal Management Strategies: Medication therapy, Routine screening Musculoskeletal Self-Management Outcome: 3 (uncertain) Falls in the past year?: No Number of falls in past year: 1 or less Was there an injury with Fall?: No Fall Risk Category Calculator: 0 Patient Fall Risk Level: Low Fall Risk Patient at Risk for Falls Due to: Mental status change Fall risk Follow up: Falls evaluation completed  Psychosocial Psychosocial Symptoms Reported: Alteration in eating habits Behavioral Management Strategies: Activity, Adequate rest, Community resources, Coping strategies, Counseling, Medication therapy, Support system Behavioral  Health Self-Management Outcome: 3 (uncertain) Major Change/Loss/Stressor/Fears (CP): Environment, Medical condition, family, Medical condition, self, Resources Techniques to [employer] with Loss/Stress/Change: Counseling, Diversional activities, Medication Quality of Family Relationships: helpful, supportive Do you  feel physically threatened by others?: No    12/17/2023    PHQ2-9 Depression Screening   Little interest or pleasure in doing things Several days  Feeling down, depressed, or hopeless Several days  PHQ-2 - Total Score 2  Trouble falling or staying asleep, or sleeping too much Several days  Feeling tired or having little energy More than half the days  Poor appetite or overeating     Feeling bad about yourself - or that you are a failure or have let yourself or your family down Several days  Trouble concentrating on things, such as reading the newspaper or watching television More than half the days  Moving or speaking so slowly that other people could have noticed.  Or the opposite - being so fidgety or restless that you have been moving around a lot more than usual Not at all  Thoughts that you would be better off dead, or hurting yourself in some way Not at all  PHQ2-9 Total Score 8  If you checked off any problems, how difficult have these problems made it for you to do your work, take care of things at home, or get along with other people Somewhat difficult  Depression Interventions/Treatment Currently on Treatment    There were no vitals filed for this visit.  Medications Reviewed Today     Reviewed by Ramonita Suzen CROME, RN (Registered Nurse) on 12/17/23 at 1738  Med List Status: <None>   Medication Order Taking? Sig Documenting Provider Last Dose Status Informant  JANIS GONG LANCETS MISC [id number] Yes EVERY DAY Lavell Bari LABOR, FNP  Active Self, Pharmacy Records  albuterol  (PROVENTIL ) (2.5 MG/3ML) 0.083% nebulizer solution [id number] Yes Take 3 mLs (2.5 mg total) by  nebulization every 6 (six) hours as needed for wheezing or shortness of breath. Joesph Annabella HERO, FNP  Active   albuterol  (VENTOLIN  HFA) 108 [id number] Base) MCG/ACT inhaler [id number] Yes Inhale 2 puffs into the lungs every 6 (six) hours as needed for wheezing or shortness of breath. Gladis, Mary-Margaret, FNP  Active   ALPRAZolam  (XANAX ) 1 MG tablet [id number] Yes Take 1 tablet (1 mg total) by mouth at bedtime as needed for anxiety. Okey Barnie SAUNDERS, MD  Active   atorvastatin  (LIPITOR) 40 MG tablet [id number] Yes Take 1 tablet (40 mg total) by mouth daily. Joesph Annabella HERO, FNP  Active   Blood Glucose Monitoring Suppl (ACCU-CHEK GUIDE ME) w/Device KIT [id number] Yes Use daily Dx E11.9 Joesph Annabella HERO, FNP  Active   budesonide-glycopyrrolate-formoterol (BREZTRI  AEROSPHERE) 160-9-4.8 MCG/ACT AERO inhaler [id number] Yes Inhale 2 puffs into the lungs 2 (two) times daily. Joesph Annabella HERO, FNP  Active   buPROPion  (WELLBUTRIN  XL) 150 MG 24 hr tablet [id number]  Take 1 tablet (150 mg total) by mouth every morning.  Patient not taking: Reported on 12/17/2023   Okey Barnie SAUNDERS, MD  Active   clobetasol  ointment (TEMOVATE ) 0.05 % [id number] Yes Pea-sized amount to affected area as needed- at most nightly Ozan, Jennifer, DO  Active   cycloSPORINE (RESTASIS) 0.05 % ophthalmic emulsion [id number] Yes Place 1 drop into both eyes at bedtime. [provider]  Active Self, Pharmacy Records  dicyclomine  (BENTYL ) 10 MG capsule [id number] Yes Take 1 capsule (10 mg total) by mouth 3 (three) times daily as needed for spasms. Carlan, Chelsea L, NP  Active   Dulaglutide  (TRULICITY ) 0.75 MG/0.5ML EMMANUEL [id number] Yes Inject 0.75 mg into the skin once a week. Joesph Annabella HERO, FNP  Active   empagliflozin  (JARDIANCE ) 25 MG  TABS tablet [id number] Yes Take 1 tablet (25 mg total) by mouth daily. Joesph Annabella HERO, FNP  Active   glucose blood (ACCU-CHEK GUIDE TEST) test strip [id number] Yes Use daily Dx E11.9 Joesph Annabella HERO, FNP   Active   HYDROcodone -acetaminophen  [facility]) 10-325 MG tablet [id number] Yes Take 1 tablet by mouth 3 (three) times daily as needed for pain. [provider]  Active Self, Pharmacy Records  latanoprost (XALATAN) 0.005 % ophthalmic solution [id number] Yes Place 1 drop into both eyes at bedtime. [provider]  Active Self, Pharmacy Records  linaclotide  (LINZESS ) 290 MCG CAPS capsule [id number] Yes Take 1 capsule (290 mcg total) by mouth daily before breakfast. Castaneda Mayorga, Daniel, MD  Active   lisinopril  (ZESTRIL ) 5 MG tablet [id number] Yes Take 0.5 tablets (2.5 mg total) by mouth daily. Joesph Annabella HERO, FNP  Active   meclizine (ANTIVERT) 25 MG tablet [id number] Yes Take 50 mg by mouth 2 (two) times daily. [provider]  Active Self, Pharmacy Records  metFORMIN  (GLUCOPHAGE ) 1000 MG tablet [id number] Yes Take 1 tablet (1,000 mg total) by mouth 2 (two) times daily with a meal. Joesph Annabella HERO, FNP  Active   omeprazole  (PRILOSEC) 40 MG capsule [id number] Yes Take 40 mg by mouth daily. [provider]  Active   ondansetron  (ZOFRAN ) 4 MG tablet [id number] Yes Take 1 tablet (4 mg total) by mouth every 8 (eight) hours as needed for nausea or vomiting. Joesph Annabella HERO, FNP  Active   pregabalin  (LYRICA ) 150 MG capsule [id number] Yes Take 1 capsule (150 mg total) by mouth 2 (two) times daily. Gershon Donnice SAUNDERS, DPM  Active   venlafaxine  XR (EFFEXOR -XR) 150 MG 24 hr capsule [id number] Yes Take 1 capsule (150 mg total) by mouth at bedtime. Okey Barnie SAUNDERS, MD  Active             Recommendation:   PCP Follow-up Continue Current Plan of Care Pre admit for endoscopy on 12/24/23  Follow Up Plan:   Telephone follow up appointment date/time:  01/14/24 3 pm by Rosina Forte, RN CM  Brason Berthelot L. Ramonita, RN, BSN, CCM [place]  [facility], [facility] RN Care Manager Direct Dial: [phone]  Fax: [phone]

## 2023-12-18 NOTE — Patient Instructions (Signed)
 Visit Information  Ms. Verley was given information about Medicaid Managed Care team care coordination services as a part of their [facility] Community Plan Medicaid benefit.   If you would like to schedule transportation through your [facility], please call the following number at least 2 days in advance of your appointment: [phone]   Rides for urgent appointments can also be made after hours by calling Member Services.  Call the Behavioral Health Crisis Line at [phone], at any time, 24 hours a day, 7 days a week. If you are in danger or need immediate medical attention call 911.  Please see education materials related to fatty liver disease & diverticulosis provided as print materials.   Patient verbalizes understanding of instructions and care plan provided today and agrees to view in MyChart. Active MyChart status and patient understanding of how to access instructions and care plan via MyChart confirmed with patient.     RN Care Manager will follow up with patient on 12/17/22 at 3 pm  Festus Pursel L. Ramonita, RN, BSN, CCM [place]  [facility], [facility] RN Care Manager Direct Dial: [phone]  Fax: [phone]   Following is a copy of your plan of care:  There are no care plans that you recently modified to display for this patient.

## 2023-12-18 NOTE — Patient Instructions (Addendum)
 Visit Information  [name] was given information about Medicaid Managed Care team care coordination services as a part of their [facility] Community Plan Medicaid benefit.   If you would like to schedule transportation through your [facility], please call the following number at least 2 days in advance of your appointment: [phone]   Rides for urgent appointments can also be made after hours by calling Member Services.  Call the Behavioral Health Crisis Line at [phone], at any time, 24 hours a day, 7 days a week. If you are in danger or need immediate medical attention call 911.  Please see education materials related to diet for irritable bowel syndrome and constipation provided as print materials.   Patient verbalizes understanding of instructions and care plan provided today and agrees to view in MyChart. Active MyChart status and patient understanding of how to access instructions and care plan via MyChart confirmed with patient.     RN Care Manager will follow up with patient on 12/26/23 3 pm  Charlotte Perez L. Ramonita, RN, BSN, CCM [place]  [facility], [facility] RN Care Manager Direct Dial: [phone]  Fax: [phone]   Following is a copy of your plan of care:  There are no care plans that you recently modified to display for this patient.

## 2023-12-22 NOTE — Telephone Encounter (Signed)
 PA for EGD Approved: Name [facility] Address [address] [place], [place], [place] [address] ID number [id number] Status In network Coverage Covered/Approved

## 2023-12-24 ENCOUNTER — Ambulatory Visit (HOSPITAL_COMMUNITY)
Admission: RE | Admit: 2023-12-24 | Discharge: 2023-12-24 | Disposition: A | Discharge status: Home / Self Care | Attending: Gastroenterology | Admitting: Gastroenterology

## 2023-12-24 ENCOUNTER — Ambulatory Visit (HOSPITAL_COMMUNITY): Admitting: Anesthesiology

## 2023-12-24 ENCOUNTER — Other Ambulatory Visit: Payer: Self-pay

## 2023-12-24 ENCOUNTER — Encounter (HOSPITAL_COMMUNITY)
Admission: RE | Disposition: A | Discharge status: Home / Self Care | Payer: Self-pay | Source: Home / Self Care | Attending: Gastroenterology

## 2023-12-24 ENCOUNTER — Encounter (HOSPITAL_COMMUNITY): Payer: Self-pay | Admitting: Gastroenterology

## 2023-12-24 ENCOUNTER — Telehealth (INDEPENDENT_AMBULATORY_CARE_PROVIDER_SITE_OTHER): Payer: Self-pay | Admitting: *Deleted

## 2023-12-24 DIAGNOSIS — R109 Unspecified abdominal pain: Secondary | ICD-10-CM | POA: Insufficient documentation

## 2023-12-24 DIAGNOSIS — I2510 Atherosclerotic heart disease of native coronary artery without angina pectoris: Secondary | ICD-10-CM | POA: Diagnosis not present

## 2023-12-24 DIAGNOSIS — E1140 Type 2 diabetes mellitus with diabetic neuropathy, unspecified: Secondary | ICD-10-CM | POA: Diagnosis not present

## 2023-12-24 DIAGNOSIS — F319 Bipolar disorder, unspecified: Secondary | ICD-10-CM | POA: Insufficient documentation

## 2023-12-24 DIAGNOSIS — F419 Anxiety disorder, unspecified: Secondary | ICD-10-CM | POA: Diagnosis not present

## 2023-12-24 DIAGNOSIS — Z7985 Long-term (current) use of injectable non-insulin antidiabetic drugs: Secondary | ICD-10-CM | POA: Insufficient documentation

## 2023-12-24 DIAGNOSIS — K648 Other hemorrhoids: Secondary | ICD-10-CM | POA: Insufficient documentation

## 2023-12-24 DIAGNOSIS — Z635 Disruption of family by separation and divorce: Secondary | ICD-10-CM | POA: Insufficient documentation

## 2023-12-24 DIAGNOSIS — Z79899 Other long term (current) drug therapy: Secondary | ICD-10-CM | POA: Insufficient documentation

## 2023-12-24 DIAGNOSIS — Z604 Social exclusion and rejection: Secondary | ICD-10-CM | POA: Diagnosis not present

## 2023-12-24 DIAGNOSIS — E785 Hyperlipidemia, unspecified: Secondary | ICD-10-CM | POA: Diagnosis not present

## 2023-12-24 DIAGNOSIS — K219 Gastro-esophageal reflux disease without esophagitis: Secondary | ICD-10-CM | POA: Insufficient documentation

## 2023-12-24 DIAGNOSIS — I10 Essential (primary) hypertension: Secondary | ICD-10-CM

## 2023-12-24 DIAGNOSIS — J4489 Other specified chronic obstructive pulmonary disease: Secondary | ICD-10-CM | POA: Diagnosis not present

## 2023-12-24 DIAGNOSIS — Z7984 Long term (current) use of oral hypoglycemic drugs: Secondary | ICD-10-CM | POA: Diagnosis not present

## 2023-12-24 DIAGNOSIS — T182XXA Foreign body in stomach, initial encounter: Secondary | ICD-10-CM | POA: Diagnosis not present

## 2023-12-24 DIAGNOSIS — K581 Irritable bowel syndrome with constipation: Secondary | ICD-10-CM | POA: Insufficient documentation

## 2023-12-24 DIAGNOSIS — R1013 Epigastric pain: Secondary | ICD-10-CM

## 2023-12-24 DIAGNOSIS — R1010 Upper abdominal pain, unspecified: Secondary | ICD-10-CM | POA: Diagnosis present

## 2023-12-24 HISTORY — PX: ESOPHAGOGASTRODUODENOSCOPY: SHX5428

## 2023-12-24 LAB — GLUCOSE, CAPILLARY: Glucose-Capillary: 172 mg/dL — ABNORMAL HIGH (ref 70–99)

## 2023-12-24 SURGERY — EGD (ESOPHAGOGASTRODUODENOSCOPY)
Anesthesia: General

## 2023-12-24 MED ORDER — LIDOCAINE 2% (20 MG/ML) 5 ML SYRINGE
INTRAMUSCULAR | Status: DC | PRN
  Administered 2023-12-24: 100 mg via INTRAVENOUS

## 2023-12-24 MED ORDER — LIDOCAINE 2% (20 MG/ML) 5 ML SYRINGE
INTRAMUSCULAR | Status: AC
  Filled 2023-12-24: qty 5

## 2023-12-24 MED ORDER — PROPOFOL 500 MG/50ML IV EMUL
INTRAVENOUS | Status: AC
Start: 2023-12-24 — End: 2023-12-24
  Filled 2023-12-24: qty 50

## 2023-12-24 MED ORDER — LACTATED RINGERS IV SOLN: INTRAVENOUS | Status: DC

## 2023-12-24 MED ORDER — PROPOFOL 500 MG/50ML IV EMUL
INTRAVENOUS | Status: DC | PRN
  Administered 2023-12-24: 150 ug/kg/min via INTRAVENOUS

## 2023-12-24 NOTE — Telephone Encounter (Signed)
 Per EGD op  note - Repeat EGD in next available - avoid opiate use for 3 days and stay on liquid diet the day before procedure

## 2023-12-24 NOTE — Transfer of Care (Signed)
 Immediate Anesthesia Transfer of Care Note  Patient: Charlotte Perez  Procedure(s) Performed: EGD (ESOPHAGOGASTRODUODENOSCOPY)  Patient Location: PACU  Anesthesia Type:General  Level of Consciousness: awake  Airway & Oxygen Therapy: Patient Spontanous Breathing  Post-op Assessment: Report given to RN and Post -op Vital signs reviewed and stable  Post vital signs: Reviewed  Last Vitals:  Vitals Value Taken Time  BP 111/54 0951  Temp 97.3 0951  Pulse 68 0951  Resp 16 0951  SpO2 94 0951    Last Pain:  Vitals:   12/24/23 0939  TempSrc:   PainSc: 8          Complications: There were no known notable events for this encounter.

## 2023-12-24 NOTE — Discharge Instructions (Signed)
 You are being discharged to home.  Minimize opiate use. Repeat EGD in next available - avoid opiate use for 3 days and stay on liquid diet the day before procedure. Resume your previous diet.

## 2023-12-24 NOTE — Anesthesia Postprocedure Evaluation (Signed)
 Anesthesia Post Note  Patient: Charlotte Perez  Procedure(s) Performed: EGD (ESOPHAGOGASTRODUODENOSCOPY)  Anesthesia Type: General Anesthetic complications: no   There were no known notable events for this encounter.   Last Vitals:  Vitals:   12/24/23 0755 12/24/23 0950  BP: 123/71 115/68  Pulse: 85 84  Resp: 17 14  Temp: 36.8 C 37 C  SpO2: 96% 96%    Last Pain:  Vitals:   12/24/23 0950  TempSrc: Oral  PainSc:                  Maurilio Saba

## 2023-12-24 NOTE — Interval H&P Note (Signed)
 History and Physical Interval Note:  12/24/2023 7:37 AM  [name]  has presented today for surgery, with the diagnosis of epigastric pain.  The various methods of treatment have been discussed with the patient and family. After consideration of risks, benefits and other options for treatment, the patient has consented to  Procedure(s) with comments: EGD (ESOPHAGOGASTRODUODENOSCOPY) (N/A) - 9:45am, asa 2 as a surgical intervention.  The patient's history has been reviewed, patient examined, no change in status, stable for surgery.  I have reviewed the patient's chart and labs.  Questions were answered to the patient's satisfaction.     Charlotte Perez

## 2023-12-24 NOTE — Anesthesia Preprocedure Evaluation (Signed)
 Anesthesia Evaluation  Patient identified by MRN, date of birth, ID band Patient awake    Reviewed: Allergy & Precautions, H&P , NPO status , Patient's Chart, lab work & pertinent test results  Airway Mallampati: II  TM Distance: >3 FB Neck ROM: Full    Dental  (+) Dental Advisory Given, Missing   Pulmonary asthma , COPD,  COPD inhaler   Pulmonary exam normal breath sounds clear to auscultation       Cardiovascular hypertension, Pt. on medications Normal cardiovascular exam Rhythm:Regular Rate:Normal     Neuro/Psych  Headaches PSYCHIATRIC DISORDERS Anxiety Depression Bipolar Disorder    Neuromuscular disease (lumbar radiculopathy, neuropathy)    GI/Hepatic Neg liver ROS,GERD  Medicated and Controlled,,  Endo/Other  negative endocrine ROSdiabetes, Well Controlled, Type 2, Oral Hypoglycemic Agents    Renal/GU Renal hypertensionRenal disease  negative genitourinary   Musculoskeletal  (+) Arthritis  (lumbar radiculopathy), Osteoarthritis,    Abdominal   Peds negative pediatric ROS (+) ADHD Hematology negative hematology ROS (+) Blood dyscrasia, anemia   Anesthesia Other Findings   Reproductive/Obstetrics negative OB ROS                              Anesthesia Physical Anesthesia Plan  ASA: 3  Anesthesia Plan: General   Post-op Pain Management:    Induction: Intravenous  PONV Risk Score and Plan: TIVA  Airway Management Planned: Nasal Cannula and Natural Airway  Additional Equipment: None  Intra-op Plan:   Post-operative Plan:   Informed Consent: I have reviewed the patients History and Physical, chart, labs and discussed the procedure including the risks, benefits and alternatives for the proposed anesthesia with the patient or authorized representative who has indicated his/her understanding and acceptance.     Dental advisory given  Plan Discussed with: CRNA  Anesthesia  Plan Comments:          Anesthesia Quick Evaluation

## 2023-12-24 NOTE — Op Note (Signed)
 [facility] Patient Name: Blia Perez Procedure Date: 12/24/2023 9:32 AM MRN: [id number] Date of Birth: 1960/04/22 Attending MD: Toribio Fortune , , [id number] CSN: [id number] Age: 63 Admit Type: Outpatient Procedure:                Upper GI endoscopy Indications:              Abdominal pain Providers:                Toribio Fortune, Crystal Page, Alm Dorcas Balm., Technician Referring MD:              Medicines:                Monitored Anesthesia Care Complications:            No immediate complications. Estimated Blood Loss:     Estimated blood loss: none. Procedure:                Pre-Anesthesia Assessment:                           - Prior to the procedure, a History and Physical                            was performed, and patient medications, allergies                            and sensitivities were reviewed. The patient's                            tolerance of previous anesthesia was reviewed.                           - The risks and benefits of the procedure and the                            sedation options and risks were discussed with the                            patient. All questions were answered and informed                            consent was obtained.                           - ASA Grade Assessment: III - A patient with severe                            systemic disease.                           After obtaining informed consent, the endoscope was                            passed under direct vision. Throughout the  procedure, the patient's blood pressure, pulse, and                            oxygen saturations were monitored continuously. The                            [id number] ([id number]) Upper was introduced through                            the mouth, and advanced to the antrum of the                            stomach. The patient tolerated the procedure well.                             The upper GI endoscopy was performed with                            difficulty due to presence of food. Scope In: 9:43:59 AM Scope Out: 9:45:35 AM Total Procedure Duration: 0 hours 1 minute 36 seconds  Findings:      The examined esophagus was normal.      A large amount of food (residue) was found in the gastric body and in       the gastric antrum. Scope was withdrawn due to aspiration risk.      Suspect abdominal pain could be related to chronic opiate use + bowel       hypersensivity + opiate induced gastroparesis. Impression:               - Normal esophagus.                           - A large amount of food (residue) in the stomach.                           - No specimens collected. Moderate Sedation:      Per Anesthesia Care Recommendation:           - Discharge patient to home (ambulatory).                           - Resume previous diet.                           - Minimize opiate use.                           - Repeat EGD in next available - avoid opiate use                            for 3 days and stay on liquid diet the day before                            procedure. Procedure Code(s):        --- Professional ---  43235, 52, Esophagogastroduodenoscopy, flexible,                            transoral; diagnostic, including collection of                            specimen(s) by brushing or washing, when performed                            (separate procedure) Diagnosis Code(s):        --- Professional ---                           R10.9, Unspecified abdominal pain CPT copyright 2022 American Medical Association. All rights reserved. The codes documented in this report are preliminary and upon coder review may  be revised to meet current compliance requirements. Toribio Fortune, MD Toribio Fortune,  12/24/2023 9:52:24 AM This report has been signed electronically. Number of Addenda: 0

## 2023-12-25 ENCOUNTER — Encounter (HOSPITAL_COMMUNITY): Payer: Self-pay | Admitting: Gastroenterology

## 2023-12-25 MED ORDER — LACTATED RINGERS IV SOLN: INTRAVENOUS | Status: DC

## 2023-12-25 NOTE — Telephone Encounter (Signed)
 PA dates of service: Start date 12/24/2023 End date 03/23/2024

## 2023-12-25 NOTE — Telephone Encounter (Signed)
 Spoke with patient, scheduled EGD for 12/30/2023 at 12:30pm. Instructions sent to [facility]. Informed patient to avoid opiate use for 3 days before procedure. Patient verbalized understanding.

## 2023-12-26 ENCOUNTER — Encounter (HOSPITAL_COMMUNITY)
Admission: RE | Admit: 2023-12-26 | Discharge: 2023-12-26 | Disposition: A | Discharge status: Home / Self Care | Source: Ambulatory Visit | Attending: Gastroenterology | Admitting: Gastroenterology

## 2023-12-26 ENCOUNTER — Other Ambulatory Visit: Payer: Self-pay | Admitting: *Deleted

## 2023-12-26 ENCOUNTER — Telehealth: Payer: Self-pay | Admitting: Family Medicine

## 2023-12-26 NOTE — Telephone Encounter (Signed)
 Pt states she was told by GI to hold her hydrocodone  due to having procedure but now wants to know what she can take for a headache. Advised pt to call her GI to see what they are ok with her taking and pt voiced understanding.

## 2023-12-26 NOTE — Patient Outreach (Signed)
 Complex Care Management   Visit Note  12/26/2023  Name:  Charlotte Perez MRN: [id number] DOB: [birth year]  Situation: Referral received for Complex Care Management related to COPD, Diabetes with Complications, and asthma hyperglycemia, neuropathy, hyperlipidemia I obtained verbal consent from Patient.  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   DDD (degenerative disc disease), cervical    DDD (degenerative disc disease), lumbar    Depression    Diabetes mellitus (HCC) 06/27/2010   Diabetes mellitus, type II (HCC)    Gallbladder sludge    GERD (gastroesophageal reflux disease)    Hyperlipidemia    Hypertension    Iron deficiency anemia 09/05/2016   Lumbar radiculopathy    Mole (skin)    Neuropathy of foot    Renal cyst    Shingles    Skin lesions, generalized    1.2 CM FLAT FAWN COLOR AT THE T10 AREA JUST RIGHT OF SPINE     Assessment: Patient Reported Symptoms:  Cognitive        Neurological      HEENT        Cardiovascular      Respiratory      Endocrine      Gastrointestinal        Genitourinary      Integumentary      Musculoskeletal          Psychosocial            12/26/2023    PHQ2-9 Depression Screening   Little interest or pleasure in doing things    Feeling down, depressed, or hopeless    PHQ-2 - Total Score    Trouble falling or staying asleep, or sleeping too much    Feeling tired or having little energy    Poor appetite or overeating     Feeling bad about yourself - or that you are a failure or have  let yourself or your family down    Trouble concentrating on things, such as reading the newspaper or watching television    Moving or speaking so slowly that other people could have noticed.  Or the opposite - being so fidgety or restless that you have been moving around a lot more than usual    Thoughts that you would be better off dead, or hurting yourself in some way    PHQ2-9 Total Score    If you checked off any problems, how difficult have these problems made it for you to do your work, take care of things at home, or get along with other people    Depression Interventions/Treatment      There were no vitals filed for this visit.  Medications Reviewed Today   Medications were not reviewed in this encounter     Recommendation:   PCP Follow-up Continue Current Plan of Care  Follow Up Plan:   Telephone follow up appointment date/time:  01/14/24  Suzen L. Ramonita, RN, BSN, CCM [place]  [facility] [employer], [employer]  RN Care Manager Direct Dial: [phone]  Fax: [phone]

## 2023-12-26 NOTE — Telephone Encounter (Signed)
 Copied from CRM [id number]. Topic: Clinical - Medical Advice >> Dec 26, 2023 12:03 PM Gustabo D wrote: Pt has a bad headache for 2 days and wants to know what she can take

## 2023-12-29 ENCOUNTER — Telehealth: Payer: Self-pay | Admitting: *Deleted

## 2023-12-29 NOTE — Telephone Encounter (Signed)
 I spoke with the patient and made her aware per Dr. Eartha, Tylenol  should be safe prior to her procedure. Patient states understanding.

## 2023-12-29 NOTE — Telephone Encounter (Signed)
 Pt left VM asking what she can take for a headache. She has upcoming procedure tomorrow. Please advise Dr. Eartha.

## 2023-12-29 NOTE — Telephone Encounter (Signed)
Called pt, no answer and VM full

## 2023-12-29 NOTE — Telephone Encounter (Signed)
 VM transferred from front desk returning call, called back, no answer and VM full.

## 2023-12-30 ENCOUNTER — Encounter (HOSPITAL_COMMUNITY)
Admission: RE | Disposition: A | Discharge status: Home / Self Care | Payer: Self-pay | Source: Home / Self Care | Attending: Gastroenterology

## 2023-12-30 ENCOUNTER — Other Ambulatory Visit: Payer: Self-pay

## 2023-12-30 ENCOUNTER — Ambulatory Visit (HOSPITAL_COMMUNITY)
Admission: RE | Admit: 2023-12-30 | Discharge: 2023-12-30 | Disposition: A | Discharge status: Home / Self Care | Attending: Gastroenterology | Admitting: Gastroenterology

## 2023-12-30 ENCOUNTER — Ambulatory Visit (HOSPITAL_COMMUNITY): Admitting: Anesthesiology

## 2023-12-30 ENCOUNTER — Encounter (HOSPITAL_COMMUNITY): Payer: Self-pay | Admitting: Gastroenterology

## 2023-12-30 DIAGNOSIS — F319 Bipolar disorder, unspecified: Secondary | ICD-10-CM | POA: Diagnosis not present

## 2023-12-30 DIAGNOSIS — Z79899 Other long term (current) drug therapy: Secondary | ICD-10-CM | POA: Insufficient documentation

## 2023-12-30 DIAGNOSIS — R1010 Upper abdominal pain, unspecified: Secondary | ICD-10-CM | POA: Diagnosis present

## 2023-12-30 DIAGNOSIS — I10 Essential (primary) hypertension: Secondary | ICD-10-CM | POA: Diagnosis not present

## 2023-12-30 DIAGNOSIS — F419 Anxiety disorder, unspecified: Secondary | ICD-10-CM | POA: Diagnosis not present

## 2023-12-30 DIAGNOSIS — J449 Chronic obstructive pulmonary disease, unspecified: Secondary | ICD-10-CM | POA: Diagnosis not present

## 2023-12-30 DIAGNOSIS — K3189 Other diseases of stomach and duodenum: Secondary | ICD-10-CM | POA: Diagnosis not present

## 2023-12-30 DIAGNOSIS — J4489 Other specified chronic obstructive pulmonary disease: Secondary | ICD-10-CM | POA: Diagnosis not present

## 2023-12-30 DIAGNOSIS — E1165 Type 2 diabetes mellitus with hyperglycemia: Secondary | ICD-10-CM | POA: Diagnosis not present

## 2023-12-30 DIAGNOSIS — K760 Fatty (change of) liver, not elsewhere classified: Secondary | ICD-10-CM | POA: Insufficient documentation

## 2023-12-30 DIAGNOSIS — Z7984 Long term (current) use of oral hypoglycemic drugs: Secondary | ICD-10-CM | POA: Insufficient documentation

## 2023-12-30 DIAGNOSIS — K589 Irritable bowel syndrome without diarrhea: Secondary | ICD-10-CM | POA: Insufficient documentation

## 2023-12-30 DIAGNOSIS — K2950 Unspecified chronic gastritis without bleeding: Secondary | ICD-10-CM | POA: Insufficient documentation

## 2023-12-30 DIAGNOSIS — K219 Gastro-esophageal reflux disease without esophagitis: Secondary | ICD-10-CM | POA: Diagnosis not present

## 2023-12-30 DIAGNOSIS — K449 Diaphragmatic hernia without obstruction or gangrene: Secondary | ICD-10-CM

## 2023-12-30 DIAGNOSIS — E1140 Type 2 diabetes mellitus with diabetic neuropathy, unspecified: Secondary | ICD-10-CM | POA: Diagnosis not present

## 2023-12-30 DIAGNOSIS — D72820 Lymphocytosis (symptomatic): Secondary | ICD-10-CM | POA: Diagnosis not present

## 2023-12-30 DIAGNOSIS — K31A11 Gastric intestinal metaplasia without dysplasia, involving the antrum: Secondary | ICD-10-CM | POA: Insufficient documentation

## 2023-12-30 HISTORY — PX: ESOPHAGOGASTRODUODENOSCOPY: SHX5428

## 2023-12-30 LAB — GLUCOSE, CAPILLARY: Glucose-Capillary: 141 mg/dL — ABNORMAL HIGH (ref 70–99)

## 2023-12-30 SURGERY — EGD (ESOPHAGOGASTRODUODENOSCOPY)
Anesthesia: General

## 2023-12-30 MED ORDER — LIDOCAINE 2% (20 MG/ML) 5 ML SYRINGE
INTRAMUSCULAR | Status: DC | PRN
  Administered 2023-12-30: 100 mg via INTRAVENOUS

## 2023-12-30 MED ORDER — METOCLOPRAMIDE HCL 5 MG PO TABS: 5.0000 mg | ORAL_TABLET | Freq: Three times a day (TID) | ORAL | 1 refills | Status: DC | PRN

## 2023-12-30 MED ORDER — PROPOFOL 10 MG/ML IV BOLUS
INTRAVENOUS | Status: DC | PRN
  Administered 2023-12-30: 50 mg via INTRAVENOUS
  Administered 2023-12-30: 100 mg via INTRAVENOUS

## 2023-12-30 NOTE — Interval H&P Note (Signed)
 History and Physical Interval Note:  12/30/2023 10:57 AM  Charlotte Perez  has presented today for surgery, with the diagnosis of gastroesophageal reflux disease, epigastric pain.  The various methods of treatment have been discussed with the patient and family. After consideration of risks, benefits and other options for treatment, the patient has consented to  Procedure(s) with comments: EGD (ESOPHAGOGASTRODUODENOSCOPY) (N/A) - 12:30pm, ASA 2 as a surgical intervention.  The patient's history has been reviewed, patient examined, no change in status, stable for surgery.  I have reviewed the patient's chart and labs.  Questions were answered to the patient's satisfaction.     Charlotte Perez

## 2023-12-30 NOTE — Anesthesia Postprocedure Evaluation (Signed)
 Anesthesia Post Note  Patient: Charlotte Perez  Procedure(s) Performed: EGD (ESOPHAGOGASTRODUODENOSCOPY)  Patient location during evaluation: PACU Anesthesia Type: General Level of consciousness: awake and alert Pain management: pain level controlled Vital Signs Assessment: post-procedure vital signs reviewed and stable Respiratory status: spontaneous breathing, nonlabored ventilation, respiratory function stable and patient connected to nasal cannula oxygen Cardiovascular status: blood pressure returned to baseline and stable Postop Assessment: no apparent nausea or vomiting Anesthetic complications: no   No notable events documented.   Last Vitals:  Vitals:   12/30/23 1111 12/30/23 1342  BP: 132/77 115/78  Pulse: 78 78  Resp: 13 16  Temp: 36.9 C 36.8 C  SpO2: 99% 99%    Last Pain:  Vitals:   12/30/23 1342  TempSrc: Oral  PainSc: 0-No pain                 Andrea Limes

## 2023-12-30 NOTE — Anesthesia Preprocedure Evaluation (Signed)
 Anesthesia Evaluation  Patient identified by MRN, date of birth, ID band Patient awake    Reviewed: Allergy & Precautions, H&P , NPO status , Patient's Chart, lab work & pertinent test results  Airway Mallampati: II  TM Distance: >3 FB Neck ROM: Full    Dental no notable dental hx.    Pulmonary asthma , COPD   Pulmonary exam normal breath sounds clear to auscultation       Cardiovascular hypertension, Normal cardiovascular exam Rhythm:Regular Rate:Normal     Neuro/Psych  Headaches PSYCHIATRIC DISORDERS Anxiety Depression Bipolar Disorder    Neuromuscular disease    GI/Hepatic Neg liver ROS,GERD  ,,  Endo/Other  diabetes    Renal/GU Renal disease  negative genitourinary   Musculoskeletal  (+) Arthritis ,    Abdominal   Peds negative pediatric ROS (+)  Hematology  (+) Blood dyscrasia, anemia   Anesthesia Other Findings   Reproductive/Obstetrics negative OB ROS                              Anesthesia Physical Anesthesia Plan  ASA: 3  Anesthesia Plan: General   Post-op Pain Management:    Induction: Intravenous  PONV Risk Score and Plan:   Airway Management Planned: Nasal Cannula  Additional Equipment:   Intra-op Plan:   Post-operative Plan:   Informed Consent: I have reviewed the patients History and Physical, chart, labs and discussed the procedure including the risks, benefits and alternatives for the proposed anesthesia with the patient or authorized representative who has indicated his/her understanding and acceptance.     Dental advisory given  Plan Discussed with: CRNA  Anesthesia Plan Comments:         Anesthesia Quick Evaluation

## 2023-12-30 NOTE — Op Note (Signed)
 [facility] Patient Name: Charlotte Perez Procedure Date: 12/30/2023 12:40 PM MRN: [id number] Date of Birth: [birth year] Attending MD: Toribio Fortune , , [id number] CSN: [id number] Age: 64 Admit Type: Outpatient Procedure:                Upper GI endoscopy Indications:              Upper abdominal pain Providers:                Toribio Fortune, Olam Ada, RN, Dorcas Lenis,                            Technician Referring MD:              Medicines:                Monitored Anesthesia Care Complications:            No immediate complications. Estimated Blood Loss:     Estimated blood loss: none. Procedure:                Pre-Anesthesia Assessment:                           - Prior to the procedure, a History and Physical                            was performed, and patient medications, allergies                            and sensitivities were reviewed. The patient's                            tolerance of previous anesthesia was reviewed.                           - The risks and benefits of the procedure and the                            sedation options and risks were discussed with the                            patient. All questions were answered and informed                            consent was obtained.                           - ASA Grade Assessment: II - A patient with mild                            systemic disease.                           After obtaining informed consent, the endoscope was                            passed under direct vision. Throughout the  procedure, the patient's blood pressure, pulse, and                            oxygen saturations were monitored continuously. The                            [id number] ([id number]) Upper was introduced through                            the mouth, and advanced to the second part of                            duodenum. The upper GI endoscopy was accomplished                             without difficulty. The patient tolerated the                            procedure well. Scope In: 1:29:57 PM Scope Out: 1:35:05 PM Total Procedure Duration: 0 hours 5 minutes 8 seconds  Findings:      A 1 cm hiatal hernia was present.      The stomach was normal. Biopsies were taken with a cold forceps for       Helicobacter pylori testing.      The examined duodenum was normal. Biopsies were taken with a cold       forceps for histology. Impression:               - 1 cm hiatal hernia.                           - Normal stomach. Biopsied.                           - Normal examined duodenum. Biopsied. Moderate Sedation:      Per Anesthesia Care Recommendation:           - Discharge patient to home (ambulatory).                           - Resume previous diet.                           - Await pathology results.                           - Minimize use of opiates as much as possible                           - Can take Reglan  5 mg every 8 hours as needed for                            abdominal pain after meals or if presenting                            nausea/vomiting Procedure Code(s):        ---  Professional ---                           [phone], Esophagogastroduodenoscopy, flexible,                            transoral; with biopsy, single or multiple Diagnosis Code(s):        --- Professional ---                           K44.9, Diaphragmatic hernia without obstruction or                            gangrene                           R10.10, Upper abdominal pain, unspecified CPT copyright 2022 American Medical Association. All rights reserved. The codes documented in this report are preliminary and upon coder review may  be revised to meet current compliance requirements. Toribio Fortune, MD Toribio Fortune,  12/30/2023 1:39:31 PM This report has been signed electronically. Number of Addenda: 0

## 2023-12-30 NOTE — Discharge Instructions (Addendum)
 You are being discharged to home.  Resume your previous diet.  We are waiting for your pathology results.  Can take Reglan  5 mg every 8 hours as needed for abdominal pain after meals or if presenting nausea/vomiting

## 2023-12-30 NOTE — Transfer of Care (Signed)
 Immediate Anesthesia Transfer of Care Note  Patient: Charlotte Perez  Procedure(s) Performed: EGD (ESOPHAGOGASTRODUODENOSCOPY)  Patient Location: Endoscopy Unit  Anesthesia Type:General  Level of Consciousness: awake, alert , oriented, and patient cooperative  Airway & Oxygen Therapy: Patient Spontanous Breathing  Post-op Assessment: Report given to RN, Post -op Vital signs reviewed and stable, and Patient moving all extremities X 4  Post vital signs: Reviewed and stable  Last Vitals:  Vitals Value Taken Time  BP    Temp    Pulse    Resp    SpO2      Last Pain:  Vitals:   12/30/23 1111  TempSrc: Oral  PainSc: 5       Patients Stated Pain Goal: 5 (12/30/23 1111)  Complications: No notable events documented.

## 2024-01-01 LAB — SURGICAL PATHOLOGY

## 2024-01-01 NOTE — Patient Instructions (Signed)
 Visit Information  Thank you for taking time to visit with me today. Please don't hesitate to contact me if I can be of assistance to you before our next scheduled appointment.  Your next care management appointment is by telephone on 12/17/23    Please call the care guide team at [phone] if you need to cancel, schedule, or reschedule an appointment.   Please  if you are experiencing a Mental Health or Behavioral Health Crisis or need someone to talk to.   Arden Axon L. Ramonita, RN, BSN, CCM [place]  [facility], [facility] RN Care Manager Direct Dial: [phone]  Fax: [phone]

## 2024-01-01 NOTE — Patient Instructions (Signed)
 Visit Information  Thank you for taking time to visit with me today. Please don't hesitate to contact me if I can be of assistance to you before our next scheduled appointment.  Your next care management appointment is 01/14/24 with rosina forte  Please call the care guide team at [phone] if you need to cancel, schedule, or reschedule an appointment.   Please call the Suicide and Crisis Lifeline: 988 call the [place]  National Suicide Prevention Lifeline: 314-047-6940 or TTY: 581-771-1630 TTY (407)591-8898) to talk to a trained counselor call 1-800-273-TALK (toll free, 24 hour hotline) call the [facility]: 515-603-1890 call 911 if you are experiencing a Mental Health or Behavioral Health Crisis or need someone to talk to.  Bayler Nehring L. Ramonita, RN, BSN, CCM [place]  [facility], [facility] RN Care Manager Direct Dial: [phone]  Fax: [phone]

## 2024-01-02 ENCOUNTER — Ambulatory Visit (INDEPENDENT_AMBULATORY_CARE_PROVIDER_SITE_OTHER): Payer: Self-pay | Admitting: Gastroenterology

## 2024-01-05 ENCOUNTER — Telehealth (INDEPENDENT_AMBULATORY_CARE_PROVIDER_SITE_OTHER): Payer: Self-pay | Admitting: Gastroenterology

## 2024-01-05 ENCOUNTER — Other Ambulatory Visit (INDEPENDENT_AMBULATORY_CARE_PROVIDER_SITE_OTHER): Payer: Self-pay

## 2024-01-05 DIAGNOSIS — I10 Essential (primary) hypertension: Secondary | ICD-10-CM | POA: Diagnosis not present

## 2024-01-05 DIAGNOSIS — K2970 Gastritis, unspecified, without bleeding: Secondary | ICD-10-CM

## 2024-01-05 DIAGNOSIS — M545 Low back pain, unspecified: Secondary | ICD-10-CM | POA: Diagnosis not present

## 2024-01-05 DIAGNOSIS — K319 Disease of stomach and duodenum, unspecified: Secondary | ICD-10-CM

## 2024-01-05 DIAGNOSIS — Z79899 Other long term (current) drug therapy: Secondary | ICD-10-CM | POA: Diagnosis not present

## 2024-01-05 DIAGNOSIS — E119 Type 2 diabetes mellitus without complications: Secondary | ICD-10-CM | POA: Diagnosis not present

## 2024-01-05 NOTE — Telephone Encounter (Signed)
 I was reached by the MyChart staff regarding a request of the patient regarding a correction about a test result.  It was not clear to me what she meant with her initial message.  I called her and she stated that she did not have any request of changing her notes on the results but she wanted to know which medication she she will hold prior to her testing.  I informed her this was omeprazole .  Case was closed.

## 2024-01-06 ENCOUNTER — Encounter (HOSPITAL_COMMUNITY): Payer: Self-pay | Admitting: Gastroenterology

## 2024-01-07 ENCOUNTER — Encounter (INDEPENDENT_AMBULATORY_CARE_PROVIDER_SITE_OTHER): Payer: Self-pay | Admitting: Gastroenterology

## 2024-01-07 DIAGNOSIS — Z780 Asymptomatic menopausal state: Secondary | ICD-10-CM | POA: Diagnosis not present

## 2024-01-07 LAB — HM DEXA SCAN: HM Dexa Scan: NORMAL

## 2024-01-08 DIAGNOSIS — Z79899 Other long term (current) drug therapy: Secondary | ICD-10-CM | POA: Diagnosis not present

## 2024-01-09 DIAGNOSIS — K319 Disease of stomach and duodenum, unspecified: Secondary | ICD-10-CM | POA: Diagnosis not present

## 2024-01-09 DIAGNOSIS — K2970 Gastritis, unspecified, without bleeding: Secondary | ICD-10-CM | POA: Diagnosis not present

## 2024-01-09 DIAGNOSIS — K2990 Gastroduodenitis, unspecified, without bleeding: Secondary | ICD-10-CM | POA: Diagnosis not present

## 2024-01-09 NOTE — Progress Notes (Signed)
 2 yr EGD noted in recall\ Patient's PCP is on EPIC

## 2024-01-13 LAB — VITAMIN B12: Vitamin B-12: 184 pg/mL — ABNORMAL LOW (ref 232–1245)

## 2024-01-13 LAB — CELIAC DISEASE PANEL
IgA/Immunoglobulin A, Serum: 216 mg/dL (ref 87–352)
Transglutaminase IgA: <2 U/mL (ref 0–3)

## 2024-01-13 LAB — INTRINSIC FACTOR ANTIBODIES: Intrinsic Factor Abs, Serum: 1 [AU]/ml (ref 0.0–1.1)

## 2024-01-13 LAB — ANTI-PARIETAL ANTIBODY: Parietal Cell Ab: 229.3 U — AB (ref 0.0–20.0)

## 2024-01-13 LAB — IGM: IgM (Immunoglobulin M), Srm: 56 mg/dL (ref 26–217)

## 2024-01-13 LAB — IGG: IgG (Immunoglobin G), Serum: 878 mg/dL (ref 586–1602)

## 2024-01-14 ENCOUNTER — Ambulatory Visit (INDEPENDENT_AMBULATORY_CARE_PROVIDER_SITE_OTHER): Payer: Self-pay | Admitting: Gastroenterology

## 2024-01-14 ENCOUNTER — Other Ambulatory Visit: Payer: Self-pay | Admitting: *Deleted

## 2024-01-14 DIAGNOSIS — R7989 Other specified abnormal findings of blood chemistry: Secondary | ICD-10-CM

## 2024-01-14 DIAGNOSIS — K2940 Chronic atrophic gastritis without bleeding: Secondary | ICD-10-CM

## 2024-01-14 MED ORDER — VITAMIN B-12 100 MCG PO TABS: 100.0000 ug | ORAL_TABLET | Freq: Every day | ORAL | 3 refills | Status: AC

## 2024-01-14 NOTE — Patient Instructions (Signed)
 Visit Information  Ms. Arch was given information about Medicaid Managed Care team care coordination services as a part of their [facility] Plan Medicaid benefit.   If you would like to schedule transportation through your [facility], please call the following number at least 2 days in advance of your appointment: [phone]   Rides for urgent appointments can also be made after hours by calling Member Services.  Call the Behavioral Health Crisis Line at (340)[phone], at any time, 24 hours a day, 7 days a week. If you are in danger or need immediate medical attention call 911.  Please see education materials related to Diabetes, COPD provided by MyChart link.  Patient verbalizes understanding of instructions and care plan provided today and agrees to view in MyChart. Active MyChart status and patient understanding of how to access instructions and care plan via MyChart confirmed with patient.     Telephone follow up appointment with Managed Medicaid care management team member scheduled for:02-13-2024 at 1:30 pm  Rosina Forte, BSN RN [facility], [facility] RN Care Manager Direct Dial: [phone]  Fax: [phone]   Following is a copy of your plan of care:  There are no care plans that you recently modified to display for this patient.

## 2024-01-14 NOTE — Patient Outreach (Signed)
 Complex Care Management   Visit Note  01/14/2024  Name:  Charlotte Perez MRN: [id number] DOB: 1960/12/29  Situation: Referral received for Complex Care Management related to COPD and Diabetes with Complications I obtained verbal consent from Patient.  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   DDD (degenerative disc disease), cervical    DDD (degenerative disc disease), lumbar    Depression    Diabetes mellitus (HCC) 06/27/2010   Diabetes mellitus, type II (HCC)    Gallbladder sludge    GERD (gastroesophageal reflux disease)    Hyperlipidemia    Hypertension    Iron deficiency anemia 09/05/2016   Lumbar radiculopathy    Mole (skin)    Neuropathy of foot    Renal cyst    Shingles    Skin lesions, generalized    1.2 CM FLAT FAWN COLOR AT THE T10 AREA JUST RIGHT OF SPINE     Assessment: Patient Reported Symptoms:  Cognitive Cognitive Status: No symptoms reported Cognitive/Intellectual Conditions Management [RPT]: None reported or documented in medical history or problem list   Health Maintenance Behaviors: Annual physical exam Healing Pattern: Average Health Facilitated by: Rest  Neurological Neurological Review of Symptoms: Dizziness    HEENT HEENT Symptoms Reported: Tinnitus, Nasal discharge      Cardiovascular Cardiovascular Symptoms Reported: No symptoms reported    Respiratory Respiratory Symptoms Reported: No symptoms reported    Endocrine Endocrine Symptoms Reported: No symptoms reported Is patient diabetic?: Yes Is patient checking blood sugars at home?: Yes List most recent blood sugar readings, include date and time of day:  01-14-2024 at 0830 CBG 139 Endocrine Self-Management Outcome: 4 (good)  Gastrointestinal Gastrointestinal Symptoms Reported: No symptoms reported      Genitourinary Genitourinary Symptoms Reported: No symptoms reported    Integumentary Integumentary Symptoms Reported: No symptoms reported    Musculoskeletal Musculoskelatal Symptoms Reviewed: Back pain Musculoskeletal Management Strategies: Medication therapy Falls in the past year?: No Number of falls in past year: 1 or less Was there an injury with Fall?: No Fall Risk Category Calculator: 0 Patient Fall Risk Level: Low Fall Risk Patient at Risk for Falls Due to: No Fall Risks Fall risk Follow up: Falls evaluation completed  Psychosocial Psychosocial Symptoms Reported: No symptoms reported Behavioral Management Strategies: Coping strategies Major Change/Loss/Stressor/Fears (CP): Denies Techniques to Cope with Loss/Stress/Change: Diversional activities Quality of Family Relationships: supportive, helpful Do you feel physically threatened by others?: No    01/14/2024    PHQ2-9 Depression Screening   Little interest or pleasure in doing things Not at all  Feeling down, depressed, or hopeless Not at all  PHQ-2 - Total Score 0  Trouble falling or staying asleep, or sleeping too much    Feeling tired or having little energy    Poor appetite or overeating     Feeling bad about yourself - or that you are a failure or have let yourself or your family down    Trouble concentrating on things, such as reading the newspaper or watching television    Moving or speaking so slowly that other people could have noticed.  Or the opposite -  being so fidgety or restless that you have been moving around a lot more than usual    Thoughts that you would be better off dead, or hurting yourself in some way    PHQ2-9 Total Score    If you checked off any problems, how difficult have these problems made it for you to do your work, take care of things at  home, or get along with other people    Depression Interventions/Treatment      There were no vitals filed for this visit.  Medications Reviewed Today     Reviewed by Bertrum Rosina HERO, RN (Registered Nurse) on 01/14/24 at 1525  Med List Status: <None>   Medication Order Taking? Sig Documenting Provider Last Dose Status Informant  JANIS GONG LANCETS MISC [id number]  EVERY DAY Lavell Bari LABOR, FNP  Active Self, Pharmacy Records  albuterol  (PROVENTIL ) (2.5 MG/3ML) 0.083% nebulizer solution [id number] Yes Take 3 mLs (2.5 mg total) by nebulization every 6 (six) hours as needed for wheezing or shortness of breath. Joesph Annabella HERO, FNP  Active   albuterol  (VENTOLIN  HFA) 108 [phone] Base) MCG/ACT inhaler [id number] Yes Inhale 2 puffs into the lungs every 6 (six) hours as needed for wheezing or shortness of breath. Gladis, Mary-Margaret, FNP  Active   ALPRAZolam  (XANAX ) 1 MG tablet [id number] Yes Take 1 tablet (1 mg total) by mouth at bedtime as needed for anxiety. Okey Barnie SAUNDERS, MD  Active   atorvastatin  (LIPITOR) 40 MG tablet [id number] Yes Take 1 tablet (40 mg total) by mouth daily. Joesph Annabella HERO, FNP  Active   Blood Glucose Monitoring Suppl (ACCU-CHEK GUIDE ME) w/Device KIT [id number] Yes Use daily Dx E11.9 Joesph Annabella HERO, FNP  Active   budesonide-glycopyrrolate-formoterol (BREZTRI  AEROSPHERE) 160-9-4.8 MCG/ACT AERO inhaler [id number]  Inhale 2 puffs into the lungs 2 (two) times daily. Joesph Annabella HERO, FNP  Active   buPROPion  (WELLBUTRIN  XL) 150 MG 24 hr tablet [id number]  Take 1 tablet (150 mg total) by mouth every morning.  Patient not taking: Reported on 01/14/2024   Okey Barnie SAUNDERS, MD  Active   clobetasol  ointment (TEMOVATE ) 0.05 % [id number] Yes Pea-sized amount to affected area as needed- at most nightly Ozan, Jennifer, DO  Active   cycloSPORINE (RESTASIS) 0.05 % ophthalmic emulsion [id number] Yes Place 1 drop into both eyes at bedtime. [provider]  Active Self, Pharmacy  Records  dicyclomine  (BENTYL ) 10 MG capsule [id number] Yes Take 1 capsule (10 mg total) by mouth 3 (three) times daily as needed for spasms. Carlan, Chelsea L, NP  Active   Dulaglutide  (TRULICITY ) 0.75 MG/0.5ML SOAJ [id number]  Inject 0.75 mg into the skin once a week.  Patient not taking: Reported on 01/14/2024   Joesph Annabella HERO, FNP  Active   empagliflozin  (JARDIANCE ) 25 MG TABS tablet [id number] Yes Take 1 tablet (25 mg total) by mouth daily. Joesph Annabella HERO, FNP  Active   glucose blood (ACCU-CHEK GUIDE TEST) test strip [id number] Yes Use daily Dx E11.9 Joesph Annabella HERO, FNP  Active   HYDROcodone -acetaminophen  [facility]) 10-325 MG tablet [id number] Yes Take 1 tablet by mouth 3 (three) times daily as needed for pain. [provider]  Active Self, Pharmacy Records  latanoprost (XALATAN) 0.005 % ophthalmic solution [id number] Yes Place 1 drop into both eyes at bedtime. [provider]  Active Self, Pharmacy Records  linaclotide  (LINZESS ) 290 MCG CAPS capsule [id number] Yes Take 1 capsule (290 mcg total) by mouth daily before breakfast. Eartha Angelia Sieving, MD  Active  lisinopril  (ZESTRIL ) 5 MG tablet [id number] Yes Take 0.5 tablets (2.5 mg total) by mouth daily. Joesph Annabella HERO, FNP  Active   meclizine (ANTIVERT) 25 MG tablet [id number]  Take 50 mg by mouth 2 (two) times daily.  Patient not taking: Reported on 01/14/2024   [provider]  Consider Medication Status and Discontinue Self, Pharmacy Records  metFORMIN  (GLUCOPHAGE ) 1000 MG tablet [id number] Yes Take 1 tablet (1,000 mg total) by mouth 2 (two) times daily with a meal. Joesph Annabella HERO, FNP  Active   metoCLOPramide  (REGLAN ) 5 MG tablet [id number]  Take 1 tablet (5 mg total) by mouth every 8 (eight) hours as needed for nausea (abdominal pain after meal).  Patient not taking: Reported on 01/14/2024   Eartha Angelia Sieving, MD  Active   omeprazole  [facility]) 40 MG capsule [id number]  Take 40 mg by mouth daily.   Patient not taking: Reported on 01/14/2024   [provider]  Active   ondansetron  (ZOFRAN ) 4 MG tablet [id number] Yes Take 1 tablet (4 mg total) by mouth every 8 (eight) hours as needed for nausea or vomiting. Joesph Annabella HERO, FNP  Active   pregabalin  (LYRICA ) 150 MG capsule [id number]  Take 1 capsule (150 mg total) by mouth 2 (two) times daily.  Patient not taking: Reported on 01/14/2024   Gershon Donnice SAUNDERS, DPM  Consider Medication Status and Discontinue   venlafaxine  XR (EFFEXOR -XR) 150 MG 24 hr capsule [id number] Yes Take 1 capsule (150 mg total) by mouth at bedtime. Okey Barnie SAUNDERS, MD  Active             Recommendation:   Continue Current Plan of Care  Follow Up Plan:   Telephone follow-up in 1 month  Rosina Forte, BSN RN [facility], [facility] RN Care Manager Direct Dial: [phone]  Fax: [phone]

## 2024-01-20 ENCOUNTER — Telehealth: Payer: Self-pay | Admitting: Pharmacy Technician

## 2024-01-20 ENCOUNTER — Telehealth (INDEPENDENT_AMBULATORY_CARE_PROVIDER_SITE_OTHER): Payer: Self-pay | Admitting: Gastroenterology

## 2024-01-20 ENCOUNTER — Ambulatory Visit (HOSPITAL_COMMUNITY): Admitting: Psychiatry

## 2024-01-20 ENCOUNTER — Other Ambulatory Visit (HOSPITAL_COMMUNITY): Payer: Self-pay

## 2024-01-20 DIAGNOSIS — J4489 Other specified chronic obstructive pulmonary disease: Secondary | ICD-10-CM

## 2024-01-20 NOTE — Telephone Encounter (Signed)
 Pt needs someone to explain her results to her regarding her procedure on 12/30/2023. [id number]

## 2024-01-20 NOTE — Telephone Encounter (Signed)
 I spoke with the patient and went back over the results below with the patient. She is aware to have her H pylori testing done on Friday 01/23/2024 and has been off the ppi for going on three weeks now. She says she has already had the labs drawn.

## 2024-01-20 NOTE — Telephone Encounter (Signed)
 Camelia LITTIE Clarity, CMA 01/05/2024  8:45 AM EDT Back to Top    I spoke with the patient and made her aware per Dr. Eartha, Patient will need to be off PPI for two weeks prior to the H pylori testing, she is aware to have this and her other labs drawn at that time at [facility]. ( I gave the patient the directions to [facility] on [address].    Camelia LITTIE Clarity, CMA 01/05/2024  8:21 AM EDT     Labs ordered for [facility].   Toribio Eartha Flavors, MD 01/02/2024  1:50 PM EDT     I reviewed the pathology results. I called the patient and informed  about the results. Pathology showed presence of chronic gastritis and focal intestinal metaplasia.  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 Pylori breath test, also sent an order for IgG, IgM, IgA, celiac disease panel, B12, anti-intrinsic factor and anti-parietal cell antibodies.  Diagnosis abnormal duodenal pathology, gastritis, autoimmune gastritis Patient knows she has to stop PPI 2 weeks before H. pylori breath test.   Thanks,   Toribio Eartha, MD Gastroenterology and Hepatology [facility]

## 2024-01-20 NOTE — Telephone Encounter (Signed)
 Pharmacy Patient Advocate Encounter   Received notification from Onbase that prior authorization for Breztri  Aerosphere 160-9-4.8MCG/ACT aerosol is required/requested.   Insurance verification completed.   The patient is insured through [employer].   Per test claim:  NAME BRAND SYMBICORT, ADVAIR, or DULERA is preferred by the insurance.  If suggested medication is appropriate, Please send in a new RX and discontinue this one. If not, please advise as to why it's not appropriate so that we may request a Prior Authorization. Please note, some preferred medications may still require a PA.  If the suggested medications have not been trialed and there are no contraindications to their use, the PA will not be submitted, as it will not be approved.  Breztri  Aerosphere 160-9-4.8MCG/ACT aerosol is plan/benefit exclusion on her plan

## 2024-01-21 ENCOUNTER — Telehealth: Payer: Self-pay

## 2024-01-21 ENCOUNTER — Other Ambulatory Visit: Payer: Self-pay | Admitting: *Deleted

## 2024-01-21 ENCOUNTER — Telehealth: Payer: Self-pay | Admitting: Pharmacy Technician

## 2024-01-21 ENCOUNTER — Other Ambulatory Visit (HOSPITAL_COMMUNITY): Payer: Self-pay

## 2024-01-21 DIAGNOSIS — J4489 Other specified chronic obstructive pulmonary disease: Secondary | ICD-10-CM

## 2024-01-21 DIAGNOSIS — J40 Bronchitis, not specified as acute or chronic: Secondary | ICD-10-CM

## 2024-01-21 MED ORDER — TRELEGY ELLIPTA 100-62.5-25 MCG/ACT IN AEPB
1.0000 | INHALATION_SPRAY | Freq: Every day | RESPIRATORY_TRACT | 11 refills | Status: DC
Start: 2024-01-21 — End: 2024-01-22

## 2024-01-21 MED ORDER — TRELEGY ELLIPTA 100-62.5-25 MCG/ACT IN AEPB: 1.0000 | INHALATION_SPRAY | Freq: Every day | RESPIRATORY_TRACT | 11 refills | Status: DC

## 2024-01-21 NOTE — Telephone Encounter (Signed)
 Pharmacy Patient Advocate Encounter   Received notification from Onbase that prior authorization for Trelegy Ellipta  100-62.5-25MCG/ACT aerosol is required/requested.   Insurance verification completed.   The patient is insured through [facility] [employer]ICA[employer].   Per test claim: Per test claim, medication is not covered due to plan/benefit exclusion, PA not submitted at this time

## 2024-01-21 NOTE — Addendum Note (Signed)
 Addended by: JOESPH ANNABELLA HERO on: 01/21/2024 08:09 AM   Modules accepted: Orders

## 2024-01-21 NOTE — Telephone Encounter (Signed)
Script changed and sent to pharmacy

## 2024-01-21 NOTE — Telephone Encounter (Signed)
 Copied from CRM [id number]. Topic: Clinical - Prescription Issue >> Jan 21, 2024  8:12 AM Emylou G wrote: Reason for CRM: The drug store called.SABRA asked us  to resend Fluticasone -Umeclidin-Vilant (TRELEGY ELLIPTA ) 100-62.5-25 MCG/ACT AEPB .SABRA Per the insurance as 60 blisters

## 2024-01-21 NOTE — Telephone Encounter (Signed)
 Trelegy ordered to see if insurance will cover this.

## 2024-01-22 ENCOUNTER — Telehealth: Payer: Self-pay

## 2024-01-22 DIAGNOSIS — J4489 Other specified chronic obstructive pulmonary disease: Secondary | ICD-10-CM

## 2024-01-22 MED ORDER — FLUTICASONE-SALMETEROL 230-21 MCG/ACT IN AERO
2.0000 | INHALATION_SPRAY | Freq: Two times a day (BID) | RESPIRATORY_TRACT | 3 refills | Status: AC
Start: 2024-01-22 — End: ?

## 2024-01-22 MED ORDER — FLUTICASONE-SALMETEROL 230-21 MCG/ACT IN AERO: 2.0000 | INHALATION_SPRAY | Freq: Two times a day (BID) | RESPIRATORY_TRACT | 12 refills | Status: DC

## 2024-01-22 NOTE — Telephone Encounter (Signed)
 Copied from CRM #[id number]. Topic: Clinical - Prescription Issue >> Jan 22, 2024 12:48 PM Nathanel BROCKS wrote: Reason for CRM: fluticasone -salmeterol (ADVAIR HFA) 230-21 MCG/ACT inhaler  Needs to be resent with the total of 12 grams for ins to pay.  Signe form the [employer]

## 2024-01-22 NOTE — Telephone Encounter (Signed)
 12g 3RF of Advair sent.

## 2024-01-22 NOTE — Telephone Encounter (Signed)
 Pt made aware.

## 2024-01-22 NOTE — Addendum Note (Signed)
 Addended by: LEIGH ROSINA SAILOR on: 01/22/2024 01:49 PM   Modules accepted: Orders

## 2024-01-22 NOTE — Telephone Encounter (Signed)
 Advair sent.

## 2024-01-22 NOTE — Addendum Note (Signed)
 Addended by: JOESPH ANNABELLA HERO on: 01/22/2024 12:42 PM   Modules accepted: Orders

## 2024-01-23 ENCOUNTER — Other Ambulatory Visit (INDEPENDENT_AMBULATORY_CARE_PROVIDER_SITE_OTHER): Payer: Self-pay | Admitting: Gastroenterology

## 2024-01-23 DIAGNOSIS — K2990 Gastroduodenitis, unspecified, without bleeding: Secondary | ICD-10-CM | POA: Diagnosis not present

## 2024-01-23 DIAGNOSIS — K319 Disease of stomach and duodenum, unspecified: Secondary | ICD-10-CM | POA: Diagnosis not present

## 2024-01-23 DIAGNOSIS — K2970 Gastritis, unspecified, without bleeding: Secondary | ICD-10-CM | POA: Diagnosis not present

## 2024-01-25 LAB — H. PYLORI BREATH TEST: H pylori Breath Test: NEGATIVE

## 2024-01-26 ENCOUNTER — Encounter (INDEPENDENT_AMBULATORY_CARE_PROVIDER_SITE_OTHER): Payer: Self-pay

## 2024-01-26 ENCOUNTER — Ambulatory Visit (INDEPENDENT_AMBULATORY_CARE_PROVIDER_SITE_OTHER): Admitting: Otolaryngology

## 2024-01-26 ENCOUNTER — Encounter (INDEPENDENT_AMBULATORY_CARE_PROVIDER_SITE_OTHER): Payer: Self-pay | Admitting: Otolaryngology

## 2024-01-26 ENCOUNTER — Ambulatory Visit (INDEPENDENT_AMBULATORY_CARE_PROVIDER_SITE_OTHER): Admitting: Audiology

## 2024-01-26 ENCOUNTER — Encounter: Payer: Self-pay | Admitting: Radiology

## 2024-01-26 VITALS — BP 120/78 | HR 95 | Ht <= 58 in | Wt 121.0 lb

## 2024-01-26 DIAGNOSIS — H6121 Impacted cerumen, right ear: Secondary | ICD-10-CM

## 2024-01-26 DIAGNOSIS — H903 Sensorineural hearing loss, bilateral: Secondary | ICD-10-CM | POA: Diagnosis not present

## 2024-01-26 DIAGNOSIS — H9313 Tinnitus, bilateral: Secondary | ICD-10-CM | POA: Diagnosis not present

## 2024-01-26 NOTE — Progress Notes (Signed)
 Dear Dr. Joesph, Here is my assessment for our mutual patient, Charlotte Perez. Thank you for allowing me the opportunity to care for your patient. Please do not hesitate to contact me should you have any other questions. Sincerely, Dr. Eldora Blanch  Otolaryngology Clinic Note Referring provider: Dr. Joesph HPI:  Charlotte Perez is a 63 y.o. female kindly referred by Dr. Joesph for evaluation of tinnitus  Initial visit (01/2024): Patient reports: reports that she has a constant static sound -- when the TV goes out. Ongoing for about 18 months, getting worse. She denies any trauma, antecedent events. No sudden hearing loss. Came on slowly. She has tried masking noise, and she can still hear it. Bothers her when she sleeps. Does not take ASA. She is on Hydrocodone  for lower back pain. No cervical spine pain. Nothing makes it better or worse. Not pulsatile.  Patient denies: ear pain, fullness, vertigo, drainage, tinnitus Patient additionally denies: deep pain in ear canal, eustachian tube symptoms such as popping, crackling, sensitive to pressure changes Patient also denies barotrauma, vestibular suppressant use, ototoxic medication use Prior ear surgery: no Did work in [place] in past (but not since 2000), some noise exposure there.   ENT Surgery: no Personal or FHx of bleeding dz or anesthesia difficulty: no  AP/AC: no  Tobacco: no  PMHx: HTN, DM, COPD(?); Back Pain, MDD/GAD  Independent Review of Additional Tests or Records:  Charlotte Perez (04/30/2023): noted bilateral tinnitus; started 16-Sep-2024new stress; dead TV station -- no difficulty hearing; no h/o noise exposure Charlotte Perez 10/22/2023: noted tinnitus of both ears; has had audio; Rx: ref to ENT Labs CBC and CMP 11/03/2023: BUN/Cr 14/0.49; WBC 5.3, Eos 100 04/2023 Audiogram was independently reviewed and interpreted by me and it reveals - A/A tymps; bilateral mild to mod/sev downsloping SNHL; WRT 100% AU;    SNHL= Sensorineural hearing loss    PMH/Meds/All/SocHx/FamHx/ROS:   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   DDD (degenerative disc disease), cervical    DDD (degenerative disc disease), lumbar    Depression    Diabetes mellitus (HCC) 06/27/2010   Diabetes mellitus, type II (HCC)    Gallbladder sludge    GERD (gastroesophageal reflux disease)    Hyperlipidemia    Hypertension    Iron deficiency anemia 09/05/2016   Lumbar radiculopathy    Mole (skin)    Neuropathy of foot    Renal cyst    Shingles    Skin lesions, generalized    1.2 CM FLAT FAWN COLOR AT THE T10 AREA JUST RIGHT OF SPINE      Past Surgical History:  Procedure Laterality Date   ABDOMINAL HYSTERECTOMY     total hysterectomy   BACK SURGERY     BIOPSY  03/07/2021   Procedure: BIOPSY;  Surgeon: Eartha Angelia Sieving, MD;  [facility];  Service: Gastroenterology;;   CESAREAN SECTION     x 2   ESOPHAGOGASTRODUODENOSCOPY N/A 12/24/2023   Procedure: EGD (ESOPHAGOGASTRODUODENOSCOPY);  Surgeon: Eartha Angelia, Sieving, MD;  Location: [facility];  Service: Gastroenterology;  Laterality: N/A;  9:45am, asa 2   ESOPHAGOGASTRODUODENOSCOPY N/A 12/30/2023   Procedure: EGD (ESOPHAGOGASTRODUODENOSCOPY);  Surgeon:  Eartha Angelia Sieving, MD;  [facility]: [facility];  Service: Gastroenterology;  Laterality: N/A;  12:30pm, ASA 2   ESOPHAGOGASTRODUODENOSCOPY (EGD) WITH PROPOFOL  N/A 03/07/2021   Procedure: ESOPHAGOGASTRODUODENOSCOPY (EGD) WITH PROPOFOL ;  Surgeon: Eartha Angelia Sieving, MD;  [facility]: [facility];  Service: Gastroenterology;  Laterality: N/A;  9:15   NECK SURGERY      Family History  Problem Relation Age  of Onset   ADD / ADHD Other    COPD Father    Alcohol abuse Father    Depression Daughter    Alcohol abuse Mother    COPD Mother        on oxygen   Colon cancer Paternal Grandfather    Heart disease Sister    Heart disease Brother    Arthritis Sister        knee replacement   Diabetes Maternal Grandmother    Diabetes Sister      Social Connections: Socially Isolated (07/10/2022)   Social Connection and Isolation Panel    Frequency of Communication with Friends and Family: Once a week    Frequency of Social Gatherings with Friends and Family: Never    Attends Religious Services: Never    Database Administrator or Organizations: No    Attends Engineer, Structural: Never    Marital Status: Married      Current Outpatient Medications:    ACCU-CHEK FASTCLIX LANCETS MISC, EVERY DAY, Disp: 102 each, Rfl: 2   albuterol  (PROVENTIL ) (2.5 MG/3ML) 0.083% nebulizer solution, Take 3 mLs (2.5 mg total) by nebulization every 6 (six) hours as needed for wheezing or shortness of breath., Disp: 75 mL, Rfl: 3   albuterol  (VENTOLIN  HFA) 108 (90 Base) MCG/ACT inhaler, Inhale 2 puffs into the lungs every 6 (six) hours as needed for wheezing or shortness of breath., Disp: 8 g, Rfl: 0   ALPRAZolam  (XANAX ) 1 MG tablet, Take 1 tablet (1 mg total) by mouth at bedtime as needed for anxiety., Disp: 30 tablet, Rfl: 2   atorvastatin  (LIPITOR) 40 MG tablet, Take 1 tablet (40 mg total) by mouth daily., Disp: 90 tablet, Rfl: 3   Blood Glucose Monitoring Suppl (ACCU-CHEK GUIDE ME) w/Device KIT, Use daily Dx E11.9, Disp: 1 kit, Rfl: 0   buPROPion  (WELLBUTRIN  XL) 150 MG 24 hr tablet, Take 1 tablet (150 mg total) by mouth every morning., Disp: 30 tablet, Rfl: 2   clobetasol  ointment (TEMOVATE ) 0.05 %, Pea-sized amount to affected area as needed- at most nightly, Disp: 30 g, Rfl: 1   cycloSPORINE (RESTASIS) 0.05 % ophthalmic emulsion, Place 1 drop into both eyes at bedtime., Disp: , Rfl:    dicyclomine   (BENTYL ) 10 MG capsule, Take 1 capsule (10 mg total) by mouth 3 (three) times daily as needed for spasms., Disp: 30 capsule, Rfl: 1   Dulaglutide  (TRULICITY ) 0.75 MG/0.5ML SOAJ, Inject 0.75 mg into the skin once a week., Disp: 6 mL, Rfl: 3   empagliflozin  (JARDIANCE ) 25 MG TABS tablet, Take 1 tablet (25 mg total) by mouth daily., Disp: 90 tablet, Rfl: 3   fluticasone -salmeterol (ADVAIR HFA) 230-21 MCG/ACT inhaler, Inhale 2 puffs into the lungs 2 (two) times daily., Disp: 12 g, Rfl: 3   glucose blood (ACCU-CHEK GUIDE TEST) test strip, Use daily Dx E11.9, Disp: 100 each, Rfl: 3   HYDROcodone -acetaminophen  (NORCO) 10-325 MG tablet, Take 1 tablet by mouth 3 (three) times daily as needed for pain., Disp: , Rfl:    latanoprost (XALATAN) 0.005 % ophthalmic solution, Place 1 drop  into both eyes at bedtime., Disp: , Rfl:    linaclotide  (LINZESS ) 290 MCG CAPS capsule, Take 1 capsule (290 mcg total) by mouth daily before breakfast., Disp: 90 capsule, Rfl: 3   lisinopril  (ZESTRIL ) 5 MG tablet, Take 0.5 tablets (2.5 mg total) by mouth daily., Disp: 90 tablet, Rfl: 3   metFORMIN  (GLUCOPHAGE ) 1000 MG tablet, Take 1 tablet (1,000 mg total) by mouth 2 (two) times daily with a meal., Disp: 180 tablet, Rfl: 3   ondansetron  (ZOFRAN ) 4 MG tablet, Take 1 tablet (4 mg total) by mouth every 8 (eight) hours as needed for nausea or vomiting., Disp: 20 tablet, Rfl: 0   pantoprazole  (PROTONIX ) 40 MG tablet, Take 40 mg by mouth daily., Disp: , Rfl:    venlafaxine  XR (EFFEXOR -XR) 150 MG 24 hr capsule, Take 1 capsule (150 mg total) by mouth at bedtime., Disp: 60 capsule, Rfl: 2   vitamin B-12 (CYANOCOBALAMIN ) 100 MCG tablet, Take 1 tablet (100 mcg total) by mouth daily., Disp: 90 tablet, Rfl: 3   meclizine (ANTIVERT) 25 MG tablet, Take 50 mg by mouth 2 (two) times daily. (Patient not taking: Reported on 01/26/2024), Disp: , Rfl:    metoCLOPramide  (REGLAN ) 5 MG tablet, Take 1 tablet (5 mg total) by mouth every 8 (eight) hours as  needed for nausea (abdominal pain after meal). (Patient not taking: Reported on 01/26/2024), Disp: 90 tablet, Rfl: 1   omeprazole  (PRILOSEC) 40 MG capsule, Take 40 mg by mouth daily. (Patient not taking: Reported on 01/26/2024), Disp: , Rfl:    pregabalin  (LYRICA ) 150 MG capsule, Take 1 capsule (150 mg total) by mouth 2 (two) times daily. (Patient not taking: Reported on 01/26/2024), Disp: 60 capsule, Rfl: 2   Physical Exam:   BP 120/78 (BP Location: Right Arm, Patient Position: Sitting, Cuff Size: Normal)   Pulse 95   Ht 4' 10 (1.473 m)   Wt 121 lb (54.9 kg)   SpO2 94%   BMI 25.29 kg/m   Salient findings:  CN II-XII intact Given history and complaints, ear microscopy was indicated and performed for evaluation with findings as below in physical exam section and in procedures; left EAC clear, right with cerumen impaction; after clearance, TM intact with well pneumatized middle ear spaces Weber 512: mid Rinne 512: AC > BC b/l  Anterior rhinoscopy: Septum intact; bilateral inferior turbinates without significant hypertrophy No lesions of oral cavity/oropharynx No obviously palpable neck masses/lymphadenopathy/thyromegaly No respiratory distress or stridor  Seprately Identifiable Procedures:  Prior to initiating any procedures, risks/benefits/alternatives were explained to the patient and verbal consent obtained. Procedure: Bilateral ear microscopy and cerumen removal using microscope (CPT [id number]) - Mod 25 Pre-procedure diagnosis: Cerumen impaction right external ears Post-procedure diagnosis: same Indication: Right cerumen impaction; given patient's otologic complaints and history as well as for improved and comprehensive examination of external ear and tympanic membrane, bilateral otologic examination using microscope was performed and impacted cerumen removed  Procedure: Patient was placed semi-recumbent. Both ear canals were examined using the microscope with findings above. Cerumen  removed on left and on right using currette with improvement in EAC examination and patency. Patient tolerated the procedure well.      Impression & Plans:  Charlotte Perez is a 63 y.o. female with:  1. Sensorineural hearing loss (SNHL) of both ears   2. Bilateral tinnitus   3. Impacted cerumen of right ear    SNHL but main complaint is bilateral non-pulsatile tinnitus - very bothersome now. D/w pt re: possible etiologies but most  likely due to SNHL; we discussed options -- observation, lipoflavonoids, retraining, HA She cannot afford HA or retraining, and opted to try lipoflavonoids Will call if she notices change in her hearing or other sx develop   See below regarding exact medications prescribed this encounter including dosages and route: No orders of the defined types were placed in this encounter.     Thank you for allowing me the opportunity to care for your patient. Please do not hesitate to contact me should you have any other questions.  Sincerely, Eldora Blanch, MD Otolaryngologist (ENT), [facility] Phone: [phone] Fax: [phone]  01/26/2024, 2:43 PM   MDM:  Level 4 Complexity/Problems addressed: mod - chronic problem with exacerbation Data complexity: mod - independent review of note, labs; test - Morbidity: low  - Prescription Drug prescribed or managed: n

## 2024-01-26 NOTE — Patient Instructions (Addendum)
 Lipoflavonoids --- can buy over the counter

## 2024-01-26 NOTE — Progress Notes (Unsigned)
  [address] [place], [place] [address] [phone]  Audiological Evaluation    Name: Charlotte Perez     DOB:   15-Apr-1960      MRN:   [id number]                                                                                     Service Date: 01/26/2024     Accompanied by: unaccompanied   Patient comes today after Dr. Tobie, ENT sent a referral for a hearing evaluation due to concerns with tinnitus.   Symptoms Yes Details  Hearing loss  []    Tinnitus  []    Ear pain/ infections/pressure  []    Balance problems  []    Noise exposure history  []    Previous ear surgeries  []    Family history of hearing loss  []    Amplification  []    Other  []      Otoscopy: Right ear: {otoscopy:31227} Left ear:  {otoscopy:31227}  Tympanometry: Right ear: {tympanometry results:31367} Left ear: {tympanometry results:31367}  Distortion Product Otoacoustic Emissions: Frequencies tested 1.6-8kHz - Diagnostic Test (12 frequencies)   Results are considered present if DP-NF is above 6dB and DP isabove -10dB. Right ear: {DPOAE's:31368::Present from 1600-8000 Hz which is suggestive of normal outer hair cell function.} Left ear: {DPOAE's:31368::Present from 1600-8000 Hz which is suggestive of normal outer hair cell function.}  Hearing Evaluation The hearing test results were completed {transducer options:31388} and results are deemed to be of {test reliability:31390::good reliability}. Test technique:  {audiometric test technique:31400::conventional}    Pure tone Audiometry: Right ear- *** {hearing loss types:31372::sensorineural hearing loss} from *** Hz - *** Hz. Left ear-  *** {hearing loss types:31372::sensorineural hearing loss} from *** Hz - *** Hz.  Speech Audiometry: Right ear- {AUD SRT/SAT:19348}. Left ear-{AUD SRT/SAT:19348}.   Word Recognition Score Tested using {word lists:31376::NU-6 (recorded)} Right ear: ***% was obtained at a presentation level of  *** dBHL with contralateral masking which is deemed as  {word recognition score:31373}. Left ear: ***% was obtained at a presentation level of *** dBHL with contralateral masking which is deemed as  {word recognition score:31373}.   Impression: {Word recognition Score interpretation:31432::There is not a significant difference in pure-tone thresholds between ears.,There is not a significant difference in the word recognition score in between ears. }   Recommendations: {Audiology Recommendations:31370::Follow up with ENT as scheduled for today.}   Duaine Radin MARIE LEROUX-MARTINEZ, AUD

## 2024-01-27 ENCOUNTER — Ambulatory Visit (INDEPENDENT_AMBULATORY_CARE_PROVIDER_SITE_OTHER): Payer: Self-pay

## 2024-02-02 ENCOUNTER — Ambulatory Visit (HOSPITAL_COMMUNITY): Admitting: Psychiatry

## 2024-02-05 DIAGNOSIS — M545 Low back pain, unspecified: Secondary | ICD-10-CM | POA: Diagnosis not present

## 2024-02-05 DIAGNOSIS — Z79899 Other long term (current) drug therapy: Secondary | ICD-10-CM | POA: Diagnosis not present

## 2024-02-05 DIAGNOSIS — I10 Essential (primary) hypertension: Secondary | ICD-10-CM | POA: Diagnosis not present

## 2024-02-05 DIAGNOSIS — E119 Type 2 diabetes mellitus without complications: Secondary | ICD-10-CM | POA: Diagnosis not present

## 2024-02-09 ENCOUNTER — Ambulatory Visit: Admitting: Family Medicine

## 2024-02-09 ENCOUNTER — Encounter: Payer: Self-pay | Admitting: Family Medicine

## 2024-02-09 VITALS — BP 109/69 | HR 95 | Temp 98.0°F | Ht <= 58 in | Wt 122.6 lb

## 2024-02-09 DIAGNOSIS — G8929 Other chronic pain: Secondary | ICD-10-CM | POA: Insufficient documentation

## 2024-02-09 DIAGNOSIS — R109 Unspecified abdominal pain: Secondary | ICD-10-CM

## 2024-02-09 NOTE — Patient Instructions (Signed)
 Ask GI about hematology referral and if they is any other medications you can try since dicyclomine  isn't helping

## 2024-02-09 NOTE — Progress Notes (Signed)
 Acute Office Visit  Subjective:     Patient ID: Charlotte Perez, female    DOB: [birth year], 63 y.o.   MRN: [id number]  Chief Complaint  Patient presents with   Abdominal Pain    HPI  History of Present Illness   Charlotte Perez is a 63 year old female who presents with persistent abdominal pain.  Abdominal pain - Persistent abdominal pain with constant presence and varying intensity - Pain described as tightening or squeezing sensation - Pain significant enough to cause discomfort, with a recent episode of increased intensity on a Saturday  Gastrointestinal workup and treatment response - Extensive work up with GI - Endoscopy and Helicobacter pylori breath test completed; no definitive cause identified - No colonoscopy performed to date - Bentyl  (dicyclomine ) prescribed for symptom management, but not providing relief - Per GI message, ? CVID for which she has been referred to hematology for vs functional   Associated gastrointestinal symptoms - No current nausea or vomiting, indicating improvement in these symptoms  - Weight trending up      ROS As per HPI.     Objective:    BP 109/69   Pulse 95   Temp 98 F (36.7 C) (Temporal)   Ht 4' 10 (1.473 m)   Wt 122 lb 9.6 oz (55.6 kg)   SpO2 98%   BMI 25.62 kg/m  Wt Readings from Last 3 Encounters:  02/09/24 122 lb 9.6 oz (55.6 kg)  01/26/24 121 lb (54.9 kg)  12/30/23 119 lb (54 kg)      Physical Exam Vitals and nursing note reviewed.  Constitutional:      General: She is not in acute distress.    Appearance: Normal appearance. She is not ill-appearing, toxic-appearing or diaphoretic.  Cardiovascular:     Rate and Rhythm: Normal rate and regular rhythm.     Pulses: Normal pulses.     Heart sounds: Normal heart sounds. No murmur heard. Pulmonary:     Effort: Pulmonary effort is normal. No respiratory distress.     Breath sounds: Normal breath sounds.  Abdominal:     General: Bowel sounds are normal.      Palpations: Abdomen is soft.     Tenderness: There is generalized abdominal tenderness. There is no guarding or rebound.  Musculoskeletal:     Cervical back: No edema. No spinous process tenderness or muscular tenderness.     Right lower leg: No edema.     Left lower leg: No edema.  Skin:    General: Skin is warm and dry.  Neurological:     General: No focal deficit present.     Mental Status: She is alert and oriented to person, place, and time.  Psychiatric:        Mood and Affect: Mood normal.        Behavior: Behavior normal.        Thought Content: Thought content normal.        Judgment: Judgment normal.     No results found for any visits on 02/09/24.      Assessment & Plan:   Charlotte Perez was seen today for abdominal pain.  Diagnoses and all orders for this visit:  Chronic abdominal pain  Assessment and Plan    Chronic abdominal pain Persistent severe squeezing pain. Bentyl  ineffective. - Instructed to contact GI specialist regarding hematology referral status. - Discuss alternative medications with GI specialist due to Bentyl  inefficacy.      Keep scheduled follow up  appt.   Charlotte CHRISTELLA Search, FNP

## 2024-02-10 DIAGNOSIS — Z79899 Other long term (current) drug therapy: Secondary | ICD-10-CM | POA: Diagnosis not present

## 2024-02-13 ENCOUNTER — Telehealth: Payer: Self-pay | Admitting: Family Medicine

## 2024-02-13 ENCOUNTER — Other Ambulatory Visit: Payer: Self-pay | Admitting: *Deleted

## 2024-02-13 ENCOUNTER — Encounter: Payer: Self-pay | Admitting: *Deleted

## 2024-02-13 VITALS — Wt 122.0 lb

## 2024-02-13 DIAGNOSIS — Z59868 Other specified financial insecurity: Secondary | ICD-10-CM

## 2024-02-13 NOTE — Telephone Encounter (Signed)
-----   Message from Nurse Georgia  M sent at 02/13/2024  2:25 PM EST ----- During care mangement outreach today, patient reports yeast in her vaginal area.  Can someone please follow up with the patient.    Thankyou  Georgia  Administrator, [employer] [phone]

## 2024-02-13 NOTE — Telephone Encounter (Signed)
 I called and spoke with patient and offered to make her an appt to be seen and tested to determine if she has a yeast infection. Patient declined at this time. She says she was prescribed CLOBETASOL  CREAM 0.05% earlier in the year from her [facility] Doctor, and was told that she could use it for yeast symptoms. Tried explaining to her that this cream helps with inflammation and itching but wont get rid of a yeast infection. Patient says the cream seems to be helping so she wants to keep using it for now but will call to make an appt if symptoms dont go away.

## 2024-02-13 NOTE — Patient Instructions (Signed)
 Visit Information  Ms. Mapel was given information about Medicaid Managed Care team care coordination services as a part of their [facility] Community Plan Medicaid benefit.   If you would like to schedule transportation through your [facility], please call the following number at least 2 days in advance of your appointment: [phone]   Rides for urgent appointments can also be made after hours by calling Member Services.  Call the Behavioral Health Crisis Line at [phone], at any time, 24 hours a day, 7 days a week. If you are in danger or need immediate medical attention call 911.  Please see education materials related to COPD, DM provided by MyChart link.  Patient verbalizes understanding of instructions and care plan provided today and agrees to view in MyChart. Active MyChart status and patient understanding of how to access instructions and care plan via MyChart confirmed with patient.     Telephone follow up appointment with Managed Medicaid care management team member scheduled for: 03/10/24 10:00am Charls Custer RN RN Care Manager [facility] [phone]   Following is a copy of your plan of care:  There are no care plans that you recently modified to display for this patient.

## 2024-02-13 NOTE — Patient Outreach (Addendum)
 Complex Care Management   Visit Note  02/13/2024  Name:  Charlotte Perez MRN: [id number] DOB: [birth year]  Situation: Referral received for Complex Care Management related to COPD, DM I obtained verbal consent from Patient.  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  Medicaid does not cover.  Referral to community resource care guides or BSW for additional resources. Patient Reported Symptoms:   Cognitive Cognitive Status: Confused or disoriented Cognitive/Intellectual Conditions Management [RPT]: Other Behavior Disorders: depression   Health Maintenance Behaviors: Annual physical exam Healing Pattern: Average Health Facilitated by: Rest, Pain control  Neurological Neurological Review of Symptoms: No symptoms reported Neurological Management Strategies: Routine screening Neurological Self-Management Outcome: 4 (good)  HEENT HEENT Symptoms Reported: No symptoms reported HEENT Management Strategies: Routine screening HEENT Comment: needs hearing aides    Cardiovascular Cardiovascular  Symptoms Reported: No symptoms reported Does patient have uncontrolled Hypertension?: No Cardiovascular Management Strategies: Adequate rest Weight: 122 lb (55.3 kg) Cardiovascular Self-Management Outcome: 4 (good)  Respiratory Respiratory Symptoms Reported: No symptoms reported Respiratory Management Strategies: Routine screening  Endocrine Endocrine Symptoms Reported: No symptoms reported Is patient diabetic?: Yes Is patient checking blood sugars at home?: Yes List most recent blood sugar readings, include date and time of day: 163 cbg fasting Endocrine Self-Management Outcome: 4 (good)  Gastrointestinal Gastrointestinal Symptoms Reported: Cramping Additional Gastrointestinal Details: may go 2 or 3 days without bowel movement Gastrointestinal Self-Management Outcome: 2 (bad)    Genitourinary Genitourinary Symptoms Reported: No symptoms reported Additional Genitourinary Details: yeast in vagina Genitourinary Self-Management Outcome: 2 (bad)  Integumentary Integumentary Symptoms Reported: No symptoms reported Skin Management Strategies: Routine screening Skin Self-Management Outcome: 4 (good)  Musculoskeletal Musculoskelatal Symptoms Reviewed: Back pain Other Musculoskeletal Symptoms: chronic back pain Musculoskeletal Management Strategies: Adequate rest, Coping strategies, Routine screening Musculoskeletal Self-Management Outcome: 3 (uncertain) Falls in the past year?: No Number of falls in past year: 1 or less Was there an injury with Fall?: No Fall Risk Category Calculator: 0 Patient Fall Risk Level: Low Fall Risk Fall risk Follow up: Falls evaluation completed  Psychosocial Psychosocial Symptoms Reported: No symptoms reported Behavioral Management Strategies: Coping strategies Behavioral Health Self-Management Outcome: 3 (uncertain) Behavioral Health Comment: meeting with therapist Major Change/Loss/Stressor/Fears (CP): Denies Quality of Family Relationships: supportive Do  you feel physically threatened by others?: No    02/13/2024    PHQ2-9 Depression Screening   Little interest or pleasure in doing things Several days  Feeling down, depressed, or hopeless Several days  PHQ-2 - Total Score 2  Trouble falling or staying asleep, or sleeping too much Several days  Feeling tired or having little energy Several days  Poor appetite or overeating  Not at all  Feeling bad about yourself - or that you are a failure or have let yourself or your family down Several days  Trouble concentrating on things, such as reading the newspaper or watching television Several days  Moving or speaking so slowly that other people could have noticed.  Or the opposite - being so fidgety or restless that you have been moving around a lot more than usual Several days  Thoughts that you would be better off dead, or hurting yourself in some way Not at all  PHQ2-9 Total Score 7  If you checked off any problems, how difficult have these problems made it for you to do your work, take care of things at home, or get along with other people Somewhat difficult  Depression Interventions/Treatment Counseling, Currently on Treatment    Today's Vitals   02/13/24 1341  Weight: 122 lb (55.3 kg)   Pain Scale: 0-10 Pain Score: 9  Pain Location: Abdomen Pain Orientation: Mid Pain Descriptors / Indicators: Aching, Tightness  Medications Reviewed Today     Reviewed by Nivia, Terrel Manalo , RN (Registered Nurse) on 02/13/24 at 1337  Med List Status: <None>   Medication Order Taking? Sig Documenting Provider Last Dose Status Informant  JANIS GONG LANCETS MISC [id number]  EVERY DAY Lavell Bari LABOR, FNP  Active Self, Pharmacy Records  albuterol  (PROVENTIL ) (2.5 MG/3ML) 0.083% nebulizer solution [id number] Yes Take 3 mLs (2.5 mg total) by nebulization every 6 (six) hours as needed for wheezing or shortness of breath. Joesph Annabella HERO, FNP  Active   albuterol  (VENTOLIN  HFA) 108 (90 Base) MCG/ACT  inhaler [id number] Yes Inhale 2 puffs into the lungs every 6 (six) hours as needed for wheezing or shortness of breath. Gladis, Mary-Margaret, FNP  Active   ALPRAZolam  (XANAX ) 1 MG tablet [id number] Yes Take 1 tablet (1 mg total) by mouth at bedtime as needed for anxiety. Okey Barnie SAUNDERS, MD  Active   atorvastatin  (LIPITOR) 40 MG tablet [id number] Yes Take 1 tablet (40 mg total) by mouth daily. Joesph Annabella HERO, FNP  Active   Blood Glucose Monitoring Suppl (ACCU-CHEK GUIDE ME) w/Device KIT [id number] Yes Use daily Dx E11.9 Joesph Annabella HERO, FNP  Active   clobetasol  ointment (TEMOVATE ) 0.05 % [id number] Yes Pea-sized amount to affected area as needed- at most nightly Ozan, Jennifer, DO  Active   cycloSPORINE (RESTASIS) 0.05 % ophthalmic emulsion [id number] Yes Place 1 drop into both eyes at bedtime. [provider]  Active Self, Pharmacy Records  dicyclomine  (BENTYL ) 10 MG capsule [id number] Yes Take 1 capsule (10 mg total) by mouth 3 (three) times daily as needed for spasms. Carlan, Chelsea L, NP  Active   Dulaglutide  (TRULICITY ) 0.75 MG/0.5ML EMMANUEL [id number] Yes Inject 0.75 mg into the skin once a week. Joesph Annabella HERO, FNP  Active   empagliflozin  (JARDIANCE ) 25 MG TABS tablet [id number] Yes Take 1 tablet (25 mg total) by mouth daily. Joesph Annabella HERO, FNP  Active   fluticasone -salmeterol (ADVAIR HFA) 230-21 MCG/ACT inhaler [id number] Yes Inhale 2 puffs into the lungs 2 (two) times daily. Gladis, Mary-Margaret, FNP  Active   glucose blood (ACCU-CHEK GUIDE TEST) test strip [id number] Yes Use daily Dx E11.9 Joesph Annabella HERO, FNP  Active   HYDROcodone -acetaminophen  [facility]) 10-325 MG tablet [id number]  Yes Take 1 tablet by mouth 3 (three) times daily as needed for pain. [provider]  Active Self, Pharmacy Records  latanoprost (XALATAN) 0.005 % ophthalmic solution [id number] Yes Place 1 drop into both eyes at bedtime. [provider]  Active Self, Pharmacy Records  linaclotide   (LINZESS ) 290 MCG CAPS capsule [id number] Yes Take 1 capsule (290 mcg total) by mouth daily before breakfast. Castaneda Mayorga, Daniel, MD  Active   lisinopril  (ZESTRIL ) 5 MG tablet [id number] Yes Take 0.5 tablets (2.5 mg total) by mouth daily. Joesph Annabella HERO, FNP  Active   metFORMIN  (GLUCOPHAGE ) 1000 MG tablet [id number] Yes Take 1 tablet (1,000 mg total) by mouth 2 (two) times daily with a meal. Joesph Annabella HERO, FNP  Active   omeprazole  (PRILOSEC) 40 MG capsule [id number] Yes Take 40 mg by mouth daily. [provider]  Active   ondansetron  (ZOFRAN ) 4 MG tablet [id number] Yes Take 1 tablet (4 mg total) by mouth every 8 (eight) hours as needed for nausea or vomiting. Joesph Annabella HERO, FNP  Active   pantoprazole  (PROTONIX ) 40 MG tablet [id number] Yes Take 40 mg by mouth daily. [provider]  Active   venlafaxine  XR (EFFEXOR -XR) 150 MG 24 hr capsule [id number] Yes Take 1 capsule (150 mg total) by mouth at bedtime. Okey Barnie SAUNDERS, MD  Active   vitamin B-12 (CYANOCOBALAMIN ) 100 MCG tablet [id number] Yes Take 1 tablet (100 mcg total) by mouth daily. Eartha Angelia Sieving, MD  Active             Recommendation:   Referral to: SW for financial assistance with hearing aides not covered by medicaid.    Follow Up Plan:   Telephone follow-up in 1 month  Darrah Dredge RN RN Care Manager [employer] [phone]

## 2024-02-17 ENCOUNTER — Encounter (HOSPITAL_COMMUNITY): Payer: Self-pay | Admitting: Psychiatry

## 2024-02-17 ENCOUNTER — Ambulatory Visit (INDEPENDENT_AMBULATORY_CARE_PROVIDER_SITE_OTHER): Admitting: Psychiatry

## 2024-02-17 VITALS — BP 134/83 | HR 91 | Ht 59.0 in | Wt 123.6 lb

## 2024-02-17 DIAGNOSIS — F411 Generalized anxiety disorder: Secondary | ICD-10-CM | POA: Diagnosis not present

## 2024-02-17 DIAGNOSIS — F321 Major depressive disorder, single episode, moderate: Secondary | ICD-10-CM

## 2024-02-17 MED ORDER — VENLAFAXINE HCL ER 150 MG PO CP24: 300.0000 mg | ORAL_CAPSULE | Freq: Every day | ORAL | 2 refills | Status: DC

## 2024-02-17 MED ORDER — ALPRAZOLAM 1 MG PO TABS: 1.0000 mg | ORAL_TABLET | Freq: Every evening | ORAL | 2 refills | Status: DC | PRN

## 2024-02-17 MED ORDER — VENLAFAXINE HCL ER 150 MG PO CP24: 150.0000 mg | ORAL_CAPSULE | Freq: Every day | ORAL | 2 refills | Status: DC

## 2024-02-17 MED ORDER — ARIPIPRAZOLE 2 MG PO TABS: 2.0000 mg | ORAL_TABLET | Freq: Every day | ORAL | 2 refills | Status: DC

## 2024-02-17 NOTE — Progress Notes (Signed)
 BH MD/PA/NP OP Progress Note  02/17/2024 4:52 PM Charlotte Perez  MRN:  [id number]  Chief Complaint:  Chief Complaint  Patient presents with   Depression   Follow-up   HPI: This patient is a 63 year old married white female who lives with her husband in [place].  She is on disability.  The patient returns for follow-up after 3 months regarding her major depression and anxiety.  She again states that she is depressed.  She does not like this time of year because she is estranged from her family.  She does not feel like doing anything and her energy is low.  She had to stop the Wellbutrin  because it caused more agitation.  She is still taking Effexor  XR 300 mg daily.  She denies any thoughts of self-harm or suicide.  She still having a lot of problems with abdominal pain.  She is eating better and has gained back a little bit of her weight.  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  The patient had tried Abilify  in the past but did not stay on it very long so I suggested we retry it and she is in agreement Visit Diagnosis:    ICD-10-CM   1. Moderate single current episode of major depressive disorder (HCC)  F32.1     2. 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   DDD (degenerative disc disease), cervical    DDD (degenerative disc disease), lumbar    Depression    Diabetes mellitus (HCC) 06/27/2010   Diabetes mellitus, type II (HCC)    Gallbladder sludge    GERD (gastroesophageal reflux disease)     Hyperlipidemia    Hypertension    Iron deficiency anemia 09/05/2016   Lumbar radiculopathy    Mole (skin)    Neuropathy of foot    Renal cyst    Shingles    Skin lesions, generalized    1.2 CM FLAT FAWN COLOR AT THE T10 AREA JUST RIGHT OF SPINE     Past Surgical History:  Procedure Laterality Date   ABDOMINAL HYSTERECTOMY     total hysterectomy   BACK SURGERY     BIOPSY  03/07/2021   Procedure: BIOPSY;  Surgeon: Eartha Angelia Sieving, MD;  [facility];  Service: Gastroenterology;;   CESAREAN SECTION     x 2   ESOPHAGOGASTRODUODENOSCOPY N/A 12/24/2023   Procedure: EGD (ESOPHAGOGASTRODUODENOSCOPY);  Surgeon: Eartha Angelia, Sieving, MD;  [facility]: [facility];  Service: Gastroenterology;  Laterality: N/A;  9:45am, asa 2   ESOPHAGOGASTRODUODENOSCOPY N/A 12/30/2023   Procedure: EGD (ESOPHAGOGASTRODUODENOSCOPY);  Surgeon: Eartha Angelia, Sieving, MD;  [facility];  Service: Gastroenterology;  Laterality: N/A;  12:30pm, ASA 2   ESOPHAGOGASTRODUODENOSCOPY (EGD) WITH PROPOFOL  N/A 03/07/2021   Procedure: ESOPHAGOGASTRODUODENOSCOPY (EGD) WITH PROPOFOL ;  Surgeon: Eartha Angelia Sieving, MD;  [facility];  Service: Gastroenterology;  Laterality: N/A;  9:15   NECK SURGERY      Family Psychiatric History:  See below  Family History:  Family History  Problem Relation Age of Onset   ADD / ADHD Other    COPD Father    Alcohol abuse Father    Depression Daughter    Alcohol abuse Mother    COPD Mother        on oxygen   Colon cancer Paternal Grandfather    Heart disease Sister    Heart disease Brother    Arthritis Sister        knee replacement   Diabetes Maternal Grandmother    Diabetes Sister     Social History:  Social History   Socioeconomic History   Marital status: Married    Spouse name: Ronnie   Number of children: 3   Years of education: 9   Highest education level: 9th grade  Occupational History   Occupation:  disablility  Tobacco Use   Smoking status: Never    Passive exposure: Yes   Smokeless tobacco: Never  Vaping Use   Vaping status: Never Used  Substance and Sexual Activity   Alcohol use: No   Drug use: No   Sexual activity: Not Currently    Birth control/protection: Surgical    Comment: hyst  Other Topics Concern   Not on file  Social History Narrative   Not on file   Social Drivers of Health   Financial Resource Strain: Medium Risk (07/10/2022)   Overall Financial Resource Strain (CARDIA)    Difficulty of Paying Living Expenses: Somewhat hard  Food Insecurity: No Food Insecurity (12/02/2023)   Hunger Vital Sign    Worried About Running Out of Food in the Last Year: Never true    Ran Out of Food in the Last Year: Never true  Transportation Needs: Unmet Transportation Needs (07/10/2022)   PRAPARE - Transportation    Lack of Transportation (Medical): No    Lack of Transportation (Non-Medical): Yes  Physical Activity: Insufficiently Active (07/10/2022)   Exercise Vital Sign    Days of Exercise per Week: 2 days    Minutes of Exercise per Session: 20 min  Stress: Stress Concern Present (07/10/2022)   [employer] of Occupational Health - Occupational Stress Questionnaire    Feeling of Stress : To some extent  Social Connections: Socially Isolated (07/10/2022)   Social Connection and Isolation Panel    Frequency of Communication with Friends and Family: Once a week    Frequency of Social Gatherings with Friends and Family: Never    Attends Religious Services: Never    Database Administrator or Organizations: No    Attends Banker Meetings: Never    Marital Status: Married    Allergies:  Allergies  Allergen Reactions   Cymbalta  [Duloxetine  Hcl]     Per pt it started making her mouth tingle and could not taste anything   Gabapentin     Itching, fidgety   Meloxicam  Other (See Comments)    Tongue tingling, and lost sense of taste.   Tramadol  Other (See  Comments)    Tongue tingling, and lost sense of taste.    Metabolic Disorder Labs: Lab Results  Component Value Date   HGBA1C 7.1 (H) 12/15/2023   No results found for: PROLACTIN Lab Results  Component Value Date   CHOL 168 09/12/2023   TRIG 112 09/12/2023   HDL 70 09/12/2023   CHOLHDL 2.4 09/12/2023   LDLCALC 78 09/12/2023   LDLCALC 77 02/07/2022   Lab Results  Component Value Date   TSH 0.706  09/12/2023   TSH 0.644 08/26/2022    Therapeutic Level Labs: No results found for: LITHIUM No results found for: VALPROATE No results found for: CBMZ  Current Medications: Current Outpatient Medications  Medication Sig Dispense Refill   ACCU-CHEK FASTCLIX LANCETS MISC EVERY DAY 102 each 2   albuterol  (PROVENTIL ) (2.5 MG/3ML) 0.083% nebulizer solution Take 3 mLs (2.5 mg total) by nebulization every 6 (six) hours as needed for wheezing or shortness of breath. 75 mL 3   albuterol  (VENTOLIN  HFA) 108 (90 Base) MCG/ACT inhaler Inhale 2 puffs into the lungs every 6 (six) hours as needed for wheezing or shortness of breath. 8 g 0   atorvastatin  (LIPITOR) 40 MG tablet Take 1 tablet (40 mg total) by mouth daily. 90 tablet 3   Blood Glucose Monitoring Suppl (ACCU-CHEK GUIDE ME) w/Device KIT Use daily Dx E11.9 1 kit 0   clobetasol  ointment (TEMOVATE ) 0.05 % Pea-sized amount to affected area as needed- at most nightly 30 g 1   cycloSPORINE (RESTASIS) 0.05 % ophthalmic emulsion Place 1 drop into both eyes at bedtime.     dicyclomine  (BENTYL ) 10 MG capsule Take 1 capsule (10 mg total) by mouth 3 (three) times daily as needed for spasms. 30 capsule 1   Dulaglutide  (TRULICITY ) 0.75 MG/0.5ML SOAJ Inject 0.75 mg into the skin once a week. 6 mL 3   empagliflozin  (JARDIANCE ) 25 MG TABS tablet Take 1 tablet (25 mg total) by mouth daily. 90 tablet 3   fluticasone -salmeterol (ADVAIR HFA) 230-21 MCG/ACT inhaler Inhale 2 puffs into the lungs 2 (two) times daily. 12 g 3   glucose blood (ACCU-CHEK  GUIDE TEST) test strip Use daily Dx E11.9 100 each 3   HYDROcodone -acetaminophen  (NORCO) 10-325 MG tablet Take 1 tablet by mouth 3 (three) times daily as needed for pain.     latanoprost (XALATAN) 0.005 % ophthalmic solution Place 1 drop into both eyes at bedtime.     linaclotide  (LINZESS ) 290 MCG CAPS capsule Take 1 capsule (290 mcg total) by mouth daily before breakfast. 90 capsule 3   lisinopril  (ZESTRIL ) 5 MG tablet Take 0.5 tablets (2.5 mg total) by mouth daily. 90 tablet 3   metFORMIN  (GLUCOPHAGE ) 1000 MG tablet Take 1 tablet (1,000 mg total) by mouth 2 (two) times daily with a meal. 180 tablet 3   omeprazole  (PRILOSEC) 40 MG capsule Take 40 mg by mouth daily.     ondansetron  (ZOFRAN ) 4 MG tablet Take 1 tablet (4 mg total) by mouth every 8 (eight) hours as needed for nausea or vomiting. 20 tablet 0   pantoprazole  (PROTONIX ) 40 MG tablet Take 40 mg by mouth daily.     vitamin B-12 (CYANOCOBALAMIN ) 100 MCG tablet Take 1 tablet (100 mcg total) by mouth daily. 90 tablet 3   ALPRAZolam  (XANAX ) 1 MG tablet Take 1 tablet (1 mg total) by mouth at bedtime as needed for anxiety. 30 tablet 2   ARIPiprazole  (ABILIFY ) 2 MG tablet Take 1 tablet (2 mg total) by mouth daily. 30 tablet 2   venlafaxine  XR (EFFEXOR -XR) 150 MG 24 hr capsule Take 2 capsules (300 mg total) by mouth at bedtime. 60 capsule 2   No current facility-administered medications for this visit.     Musculoskeletal: Strength & Muscle Tone: within normal limits Gait & Station: normal Patient leans: N/A  Psychiatric Specialty Exam: Review of Systems  Gastrointestinal:  Positive for abdominal pain.  Psychiatric/Behavioral:  Positive for dysphoric mood.   All other systems reviewed and are negative.  Blood pressure 134/83, pulse 91, height 4' 11 (1.499 m), weight 123 lb 9.6 oz (56.1 kg), SpO2 98%.Body mass index is 24.96 kg/m.  General Appearance: Casual and Fairly Groomed  Eye Contact:  Good  Speech:  Clear and Coherent   Volume:  Normal  Mood:  Depressed  Affect:  Flat  Thought Process:  Goal Directed  Orientation:  Full (Time, Place, and Person)  Thought Content: Rumination   Suicidal Thoughts:  No  Homicidal Thoughts:  No  Memory:  Immediate;   Good Recent;   Good Remote;   Good  Judgement:  Good  Insight:  Fair  Psychomotor Activity:  Decreased  Concentration:  Concentration: Good and Attention Span: Good  Recall:  Good  Fund of Knowledge: Fair  Language: Good  Akathisia:  No  Handed:  Right  AIMS (if indicated): not done  Assets:  Communication Skills Desire for Improvement Resilience Social Support  ADL's:  Intact  Cognition: WNL  Sleep:  Good   Screenings: GAD-7    Flowsheet Row Office Visit from 02/09/2024 in [facility] Office Visit from 12/15/2023 in [facility] Office Visit from 11/07/2023 in [facility] Office Visit from 10/22/2023 in [facility] Office Visit from 09/12/2023 in [facility]  Total GAD-7 Score 11 5 10 7 10    PHQ2-9    Flowsheet Row Patient Outreach Telephone from 02/13/2024 in [place] [facility] Office Visit from 02/09/2024 in [facility] Patient Outreach Telephone from 01/14/2024 in [place] [facility] Patient Outreach Telephone from 12/17/2023 in [place] [facility]PART[facility] Office Visit from 12/15/2023 in [facility]  PHQ-2 Total Score 2 6 0 2 5  PHQ-9 Total Score 7 18 -- 8 15   Flowsheet Row Admission (Discharged) from 12/30/2023 in [facility] Admission (Discharged) from 12/24/2023 in [facility] ED from 11/03/2023 in [facility] at [facility]  C-SSRS RISK CATEGORY No Risk No Risk No Risk     Assessment and Plan: This  patient is a 63 year old female with a history of major depression and generalized anxiety.  She will continue Effexor  XR 300 mg daily for depression, Xanax  1 mg daily as needed for anxiety and add Abilify  2 mg daily for augmentation.  She will return to see me in 3 months  Collaboration of Care: Collaboration of Care: Notes are shared with PCP on the epic system  Patient/Guardian was advised Release of Information must be obtained prior to any record release in order to collaborate their care with an outside provider. Patient/Guardian was advised if they have not already done so to contact the registration department to sign all necessary forms in order for us  to release information regarding their care.   Consent: Patient/Guardian gives verbal consent for treatment and assignment of benefits for services provided during this visit. Patient/Guardian expressed understanding and agreed to proceed.    Barnie Gull, MD 02/17/2024, 4:52 PM

## 2024-02-19 DIAGNOSIS — G43819 Other migraine, intractable, without status migrainosus: Secondary | ICD-10-CM | POA: Diagnosis not present

## 2024-02-19 DIAGNOSIS — R519 Headache, unspecified: Secondary | ICD-10-CM | POA: Diagnosis not present

## 2024-02-20 ENCOUNTER — Other Ambulatory Visit: Payer: Self-pay

## 2024-02-20 NOTE — Patient Instructions (Signed)
 Visit Information  Thank you for taking time to visit with me today. Please don't hesitate to contact me if I can be of assistance to you before our next scheduled appointment.  Our next appointment is by telephone on 02/27/24 at 11am Please call the care guide team at [phone] if you need to cancel or reschedule your appointment.   Following is a copy of your care plan:   Goals Addressed             This Visit's Progress    BSW Goals       Current SDOH Barriers:  Financial constraints related to purchasing hearing aids.  Interventions: Patient interviewed and appropriate screenings performed Referred patient to community resources  Provided patient with information about Deaf and Hard of Hearing services Advised patient to Deaf and Hard of Hearing and [facility] for extra benefits Provided education on Advanced Directives Patient has completed hearing exam but can't afford the cost as her insurance does not cover.  SW verified online information and the plan does not cover hearing aids.          Please call 911 if you are experiencing a Mental Health or Behavioral Health Crisis or need someone to talk to.  Patient verbalized understanding of Care plan and visit instructions communicated this visit  Tillman Gardener, BSW [place]  [facility], [facility] Social Worker Direct Dial: [phone]  Fax: [phone] Website: [email or web address]

## 2024-02-20 NOTE — Patient Outreach (Signed)
 Social Drivers of Health  Community Resource and Care Coordination Visit Note   02/20/2024  Name: Charlotte Perez MRN: [id number] DOB:03-16-1961  Situation: Referral received for [facility] needs assessment and assistance related to Hearing Aid. I obtained verbal consent from Patient.  Visit completed with Patient on the phone.   Background:   SDOH Interventions Today    Flowsheet Row Most Recent Value  SDOH Interventions   Food Insecurity Interventions Other (Comment)  [Receives Foodstamps and local food banks]  Housing Interventions Intervention Not Indicated  Transportation Interventions Intervention Not Indicated  [Has a car and access to Medicaid transportation]  Utilities Interventions Intervention Not Indicated  Financial Strain Interventions Intervention Not Indicated     Assessment:   Goals Addressed             This Visit's Progress    BSW Goals       Current SDOH Barriers:  Financial constraints related to purchasing hearing aids.  Interventions: Patient interviewed and appropriate screenings performed Referred patient to community resources  Provided patient with information about Deaf and Hard of Hearing services Advised patient to Deaf and Hard of Hearing and [facility] Community for extra benefits Provided education on Advanced Directives Patient has completed hearing exam but can't afford the cost as her insurance does not cover.  SW verified online information and the plan does not cover hearing aids.          Recommendation:   Patient to follow up with insurance for extra benefits and Deaf and Hard of Hearing program.  Follow Up Plan:   Telephone follow up appointment date/time:  02/27/24 at 11am  Tillman Gardener, BSW [place]  [facility], [facility] Social Worker Direct Dial: [phone]  Fax: [phone] Website: [email or web address]

## 2024-02-27 ENCOUNTER — Telehealth: Payer: Self-pay

## 2024-02-27 ENCOUNTER — Telehealth (INDEPENDENT_AMBULATORY_CARE_PROVIDER_SITE_OTHER): Payer: Self-pay | Admitting: Gastroenterology

## 2024-02-27 ENCOUNTER — Encounter (INDEPENDENT_AMBULATORY_CARE_PROVIDER_SITE_OTHER): Payer: Self-pay | Admitting: Gastroenterology

## 2024-02-27 NOTE — Telephone Encounter (Signed)
 I received a call from the patient stating she was presenting persistent upper abdominal pain and distention along with frequent nausea.  She has been taking hydrocodone  chronically for back pain for multiple years and states that she may take 3 pills every 8-12 hours depending on how her pain is.  Also reports that she is having vomiting of food contents 3 days after consuming the meal.  I suspect a lot of her symptoms are related to opiate induced gastroparesis and hypomotility.  I emphasized the importance of minimizing the use of these agents.  However, we will perform a gastric emptying study to rule this out.  Hi Autumn/Mindy/Tammy, can you please schedule a gastric emptying study? Dx: gastroparesis.   Thanks,  Toribio Fortune, MD Gastroenterology and Hepatology [facility] Gastroenterology

## 2024-03-01 ENCOUNTER — Telehealth (INDEPENDENT_AMBULATORY_CARE_PROVIDER_SITE_OTHER): Payer: Self-pay

## 2024-03-01 DIAGNOSIS — K3184 Gastroparesis: Secondary | ICD-10-CM

## 2024-03-01 NOTE — Telephone Encounter (Signed)
 Prior Auth for Gastric Emptying on [facility]: Precertification Is Required  CPT Code 21735 Description: Gastric Emptying Study Authorization Number: [id number] Case Number: [id number] Review Date: 03/01/2024 10:00:35 AM Expiration Date: 04/15/2024 Status: Your case has been Approved.

## 2024-03-01 NOTE — Telephone Encounter (Signed)
 Spoke with patient, scheduled gastric emptying for 03/08/2024 at 10:00am. Patient aware and verbalized understanding.

## 2024-03-03 ENCOUNTER — Other Ambulatory Visit: Payer: Self-pay

## 2024-03-03 NOTE — Patient Instructions (Signed)
 Visit Information  Thank you for taking time to visit with me today. Please don't hesitate to contact me if I can be of assistance to you before our next scheduled appointment.  Your next care management appointment is by telephone on 03/03/24 at 10am  Please call the care guide team at [phone] if you need to cancel, schedule, or reschedule an appointment.   Please call 911 if you are experiencing a Mental Health or Behavioral Health Crisis or need someone to talk to.  Tillman Gardener, BSW [place]  [facility], [facility] Social Worker Direct Dial: [phone]  Fax: [phone] Website: [email or web address]

## 2024-03-03 NOTE — Patient Outreach (Signed)
 Social Drivers of Health  Community Resource and Care Coordination Visit Note   03/03/2024  Name: SERETHA ESTABROOKS MRN: [id number] DOB:16-Mar-1961  Situation: Referral received for [facility] needs assessment and assistance related to Financial Strain . I obtained verbal consent from Patient.  Visit completed with Patient on the phone.   Background:   SDOH Interventions Today    Flowsheet Row Most Recent Value  SDOH Interventions   Financial Strain Interventions Other (Comment)  [Patient has to contact local Deaf and Hard of Hearing provider for assessment.  Patient has not contacted [employer] complete for extra benefits. SW t/c [employer] Complete and obtain numbers to call for extra benefits. Patient set up for 6 month fruit/vegetable box.]     Assessment:   Goals Addressed             This Visit's Progress    BSW Goals       Current SDOH Barriers:  Financial constraints related to purchasing hearing aids.  Interventions: Patient interviewed and appropriate screenings performed Referred patient to community resources  Provided patient with information about Deaf and Hard of Hearing services Advised patient to schedule for assessment with Deaf and Hard of Hearing provider  SW contact insurance and will need to contact [employer] for asthma assistance, website [email or web address] for free gym membership, take picture and email [email or web address] for reimbursement for vitamins, [phone] for the additional Rewards Department for wellness benefits. Patient is scheduled soon to have Insurance [place] to review plan and decide if she will continue with [facility].          Recommendation:   Patient will follow up with Deaf and Hard of Hearing provider for assessment.  Follow Up Plan:   Telephone follow up appointment date/time:  03/03/24 at 10am  Tillman Gardener, BSW [place]  [facility], [facility] Social Worker Direct Dial: [phone]  Fax:  [phone] Website: [email or web address]

## 2024-03-04 ENCOUNTER — Telehealth: Payer: Self-pay

## 2024-03-04 DIAGNOSIS — M545 Low back pain, unspecified: Secondary | ICD-10-CM | POA: Diagnosis not present

## 2024-03-04 DIAGNOSIS — E119 Type 2 diabetes mellitus without complications: Secondary | ICD-10-CM | POA: Diagnosis not present

## 2024-03-04 DIAGNOSIS — Z79899 Other long term (current) drug therapy: Secondary | ICD-10-CM | POA: Diagnosis not present

## 2024-03-04 DIAGNOSIS — I10 Essential (primary) hypertension: Secondary | ICD-10-CM | POA: Diagnosis not present

## 2024-03-04 NOTE — Patient Instructions (Signed)
 Charlotte Perez - I am sorry I was unable to reach you today for our scheduled appointment. I work with Joesph Annabella HERO, FNP and am calling to support your healthcare needs. Please contact me at [phone] at your earliest convenience. I look forward to speaking with you soon.   Thank you,  Tillman Gardener, BSW [place]  [facility], [facility] Social Worker Direct Dial: [phone]  Fax: [phone] Website: [email or web address]

## 2024-03-08 ENCOUNTER — Inpatient Hospital Stay (HOSPITAL_COMMUNITY): Admission: RE | Admit: 2024-03-08 | Discharge: 2024-03-08 | Attending: Gastroenterology

## 2024-03-08 ENCOUNTER — Other Ambulatory Visit: Payer: Self-pay

## 2024-03-08 ENCOUNTER — Encounter (HOSPITAL_COMMUNITY): Payer: Self-pay

## 2024-03-08 DIAGNOSIS — K3184 Gastroparesis: Secondary | ICD-10-CM

## 2024-03-08 DIAGNOSIS — Z79899 Other long term (current) drug therapy: Secondary | ICD-10-CM | POA: Diagnosis not present

## 2024-03-08 NOTE — Patient Instructions (Signed)
 Visit Information  Thank you for taking time to visit with me today. Please don't hesitate to contact me if I can be of assistance to you before our next scheduled appointment.  Your next care management appointment is by telephone on 03/16/24 at 3pm   Please call the care guide team at [phone] if you need to cancel, schedule, or reschedule an appointment.   Please call 911 if you are experiencing a Mental Health or Behavioral Health Crisis or need someone to talk to.  Tillman Gardener, BSW [place]  [facility], [facility] Social Worker Direct Dial: [phone]  Fax: [phone] Website: [email or web address]

## 2024-03-08 NOTE — Patient Outreach (Signed)
 Social Drivers of Health  Community Resource and Care Coordination Visit Note   03/08/2024  Name: Charlotte Perez MRN: [id number] DOB:June 30, 1960  Situation: Referral received for [facility] needs assessment and assistance related to Hearing Aide. I obtained verbal consent from Patient.  Visit completed with Patient on the phone.   Background:      Assessment:   Goals Addressed             This Visit's Progress    BSW Goals       Current SDOH Barriers:  Financial constraints related to purchasing hearing aids.  Interventions: Patient interviewed and appropriate screenings performed Referred patient to community resources  Provided patient with information about Deaf and Hard of Hearing services Advised patient to keep medical appointment to obtain orders for hearing provider  SW contact insurance Reward Benefits department but staff reports no benefits have been added at this time and patient can call back in 2026.  SW t/c [employer] for asthma assistance and they do not provide vacuum and air purifier, only mattress and pillow cover.  SW provides website [email or web address] for free gym membership, take picture and email [email or web address] for reimbursement for vitamins, SW provided number to Rewards Department with [facility] Complete [phone].          Recommendation:   Keep medical appointment to obtain orders for hearing appointment.  Email [employer] Complete staff for reimbursement for vitamins.  Follow Up Plan:   Telephone follow up appointment date/time:  03/16/24 at 3pm  Tillman Gardener, BSW [place]  [facility], [facility] Social Worker Direct Dial: [phone]  Fax: [phone] Website: [email or web address]

## 2024-03-10 ENCOUNTER — Telehealth: Admitting: *Deleted

## 2024-03-11 ENCOUNTER — Ambulatory Visit (INDEPENDENT_AMBULATORY_CARE_PROVIDER_SITE_OTHER): Admitting: Gastroenterology

## 2024-03-12 ENCOUNTER — Other Ambulatory Visit: Payer: Self-pay | Admitting: *Deleted

## 2024-03-12 NOTE — Patient Instructions (Signed)
 Visit Information  Ms. Zawistowski was given information about Medicaid Managed Care team care coordination services as a part of their [facility] Community Plan Medicaid benefit.   If you would like to schedule transportation through your [facility], please call the following number at least 2 days in advance of your appointment: [phone]   Rides for urgent appointments can also be made after hours by calling Member Services.  Call the Behavioral Health Crisis Line at [phone], at any time, 24 hours a day, 7 days a week. If you are in danger or need immediate medical attention call 911.  Please see education materials related to COPD, Diabetes provided by MyChart link.  Patient verbalizes understanding of instructions and care plan provided today and agrees to view in MyChart. Active MyChart status and patient understanding of how to access instructions and care plan via MyChart confirmed with patient.     Telephone follow up appointment with Managed Medicaid care management team member scheduled for:04-12-2024 at 11:00 am  Rosina Forte, BSN RN [facility], [facility] RN Care Manager Direct Dial: [phone]  Fax: [phone]   Following is a copy of your plan of care:   Goals Addressed             This Visit's Progress    VBCI RN Care Plan - COPD   On track    Problems:  Chronic Disease Management support and education needs related to COPD  Goal: Over the next 90 days the Patient will attend all scheduled medical appointments: with primary care provider and specialist as evidenced by keeping all scheduled appointments        continue to work with RN Care Manager and/or Social Worker to address care management and care coordination needs related to COPD as evidenced by adherence to care management team scheduled appointments     take all medications exactly as prescribed and will call provider for medication  related questions as evidenced by compliance with all medications    verbalize basic understanding of COPD disease process and self health management plan as evidenced by verbal explanation, recognizing/monitoring symptoms, lifestyle modifications  Interventions:   COPD Interventions: Advised patient to engage in light exercise as tolerated 3-5 days a week to aid in the the management of COPD. Reports no acute issues or changes. Advised patient to track and manage COPD triggers Advised patient to self assesses COPD action plan zone and make appointment with provider if in the yellow zone for 48 hours without improvement. Action plan reviewed with patient and copy provided in AVS. Assessed social determinant of health barriers Discussed the importance of adequate rest and management of fatigue with COPD Provided education about and advised patient to utilize infection prevention strategies to reduce risk of respiratory infection Provided instruction about proper use of medications used for management of COPD including inhalers. Reports compliance with all medications Provided patient with basic written and verbal COPD education on self care/management/and exacerbation prevention. Reports no acute change with COPD at this time. She does endorse second hand smoking from her husband in the home. Provided written and verbal instructions on pursed lip breathing and utilized returned demonstration as teach back Screening for signs and symptoms of depression related to chronic disease state. Pt. Reports depression and has an appt. With her therapist  Patient Self-Care Activities:  Attend all scheduled provider appointments Call provider office for new concerns or questions  eliminate smoking in my home begin a symptom  diary follow rescue plan if symptoms flare-up  Plan:  Telephone follow up appointment with care management team member scheduled for:  04-12-2024 at 11:00 am          VBCI RN Care  Plan - DM   Improving    Problems:  Chronic Disease Management support and education needs related to DMII  Goal: Over the next 90 days the Patient will attend all scheduled medical appointments: with primary care provider and specialist as evidenced by keeping all scheduled appointments        continue to work with RN Care Manager and/or Social Worker to address care management and care coordination needs related to DMII as evidenced by adherence to care management team scheduled appointments     take all medications exactly as prescribed and will call provider for medication related questions as evidenced by compliance with all medications    verbalize basic understanding of DMII disease process and self health management plan as evidenced by verbal explanation, recognizing/monitoring symptoms, lifestyle modifications  Interventions:   Diabetes Interventions: Assessed patient's understanding of A1c goal: <7% Provided education to patient about basic DM disease process. Reports checking blood sugar daily. Fasting: 132,  Highest: 267, Lowest: 94. RNCM reviewed with patient goal fasting <130 post prandial <180. Reviewed medications with patient and discussed importance of medication adherence. Counseled on importance of regular laboratory monitoring as prescribed. Labs up to date Discussed plans with patient for ongoing care management follow up and provided patient with direct contact information for care management team Provided patient with written educational materials related to hypo and hyperglycemia and importance of correct treatment Reviewed scheduled/upcoming provider appointments including: 03-15-2024 with PCP Advised patient, providing education and rationale, to check cbg daily and record, calling provider for findings outside established parameters Review of patient status, including review of consultants reports, relevant laboratory and other test results, and medications  completed Screening for signs and symptoms of depression related to chronic disease state  Assessed social determinant of health barriers Lab Results  Component Value Date   HGBA1C 7.1 (H) 12/15/2023    Patient Self-Care Activities:  Attend all scheduled provider appointments Call pharmacy for medication refills 3-7 days in advance of running out of medications Call provider office for new concerns or questions  Take medications as prescribed   schedule appointment with eye doctor check blood sugar at prescribed times: once daily check feet daily for cuts, sores or redness drink 6 to 8 glasses of water each day eat fish at least once per week fill half of plate with vegetables limit fast food meals to no more than 1 per week manage portion size switch to low-fat or skim milk switch to sugar-free drinks wash and dry feet carefully every day  Plan:  Telephone follow up appointment with care management team member scheduled for:  04-12-2024  at 11:00 am

## 2024-03-12 NOTE — Patient Outreach (Signed)
 Complex Care Management   Visit Note  03/12/2024  Name:  Charlotte Perez MRN: [id number] DOB: 11-12-1960  Situation: Referral received for Complex Care Management related to COPD and Diabetes with Complications I obtained verbal consent from Patient.  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   DDD (degenerative disc disease), cervical    DDD (degenerative disc disease), lumbar    Depression    Diabetes mellitus (HCC) 06/27/2010   Diabetes mellitus, type II (HCC)    Gallbladder sludge    GERD (gastroesophageal reflux disease)    Hyperlipidemia    Hypertension    Iron deficiency anemia 09/05/2016   Lumbar radiculopathy    Mole (skin)    Neuropathy of foot    Renal cyst    Shingles    Skin lesions, generalized    1.2 CM FLAT FAWN COLOR AT THE T10 AREA JUST RIGHT OF SPINE     Assessment: Patient Reported Symptoms:  Cognitive Cognitive Status: Alert and oriented to person, place, and time Cognitive/Intellectual Conditions Management [RPT]: None reported or documented in medical history or problem list   Health Maintenance Behaviors: Annual physical exam Healing Pattern: Average Health Facilitated by: Rest  Neurological Neurological Review of Symptoms: No symptoms reported Neurological Self-Management Outcome: 4 (good)  HEENT HEENT Symptoms Reported: Tinnitus, Nasal discharge HEENT Management Strategies: Routine screening HEENT Self-Management Outcome: 3 (uncertain)    Cardiovascular Cardiovascular Symptoms Reported: No symptoms reported Cardiovascular Management Strategies: Routine screening Cardiovascular Self-Management Outcome: 4 (good)   Respiratory Respiratory Symptoms Reported: No symptoms reported Respiratory Self-Management Outcome: 4 (good)  Endocrine Endocrine Symptoms Reported: No symptoms reported Is patient diabetic?: Yes Is patient checking blood sugars at home?: Yes List most recent blood sugar readings, include date and time of day: 03-12-2024 fasting 132 Endocrine Self-Management Outcome: 4 (good)  Gastrointestinal Gastrointestinal Symptoms Reported: No symptoms reported Gastrointestinal Self-Management Outcome: 4 (good)    Genitourinary Genitourinary Symptoms Reported: Itching or irritation Genitourinary Management Strategies: Medication therapy Genitourinary Self-Management Outcome: 3 (uncertain)  Integumentary Integumentary Symptoms Reported: No symptoms reported Skin Management Strategies: Routine screening Skin Self-Management Outcome: 4 (good)  Musculoskeletal Musculoskelatal Symptoms Reviewed: Back pain Musculoskeletal Management Strategies: Routine screening Falls in the past year?: No Number of falls in past year: 1 or less Was there an injury with Fall?: No Fall Risk Category Calculator: 0 Patient Fall Risk Level: Low Fall Risk Patient at Risk for Falls Due to: No Fall Risks Fall risk Follow up: Falls evaluation completed  Psychosocial Psychosocial Symptoms Reported: No symptoms reported Behavioral Management Strategies: Coping strategies Behavioral Health Self-Management Outcome: 4 (good) Major Change/Loss/Stressor/Fears (CP): Denies Techniques to Cope with Loss/Stress/Change: Diversional activities      03/12/2024    PHQ2-9 Depression Screening   Little interest or pleasure in doing things Not at all  Feeling down, depressed, or hopeless Several days  PHQ-2 - Total Score 1  Trouble falling or staying asleep, or sleeping too much    Feeling tired or having little energy    Poor appetite or overeating     Feeling bad about yourself - or that you are a failure or have  let yourself or  your family down    Trouble concentrating on things, such as reading the newspaper or watching television    Moving or speaking so slowly that other people could have noticed.  Or the opposite - being so fidgety or restless that you have been moving around a lot more than usual    Thoughts that you would be better off dead, or hurting yourself in some way    PHQ2-9 Total Score    If you checked off any problems, how difficult have these problems made it for you to do your work, take care of things at home, or get along with other people    Depression Interventions/Treatment      There were no vitals filed for this visit. Pain Scale: 0-10 Pain Score: 6  Pain Type: Chronic pain Pain Location: Back Pain Descriptors / Indicators: Tightness Pain Intervention(s): Medication (See eMAR)  Medications Reviewed Today     Reviewed by Bertrum Rosina HERO, RN (Registered Nurse) on 03/12/24 at 1049  Med List Status: <None>   Medication Order Taking? Sig Documenting Provider Last Dose Status Informant  JANIS GONG LANCETS MISC [id number]  EVERY DAY Lavell Bari LABOR, FNP  Active Self, Pharmacy Records  albuterol  (PROVENTIL ) (2.5 MG/3ML) 0.083% nebulizer solution [id number]  Take 3 mLs (2.5 mg total) by nebulization every 6 (six) hours as needed for wheezing or shortness of breath. Joesph Annabella HERO, FNP  Active   albuterol  (VENTOLIN  HFA) 108 [phone] Base) MCG/ACT inhaler [id number]  Inhale 2 puffs into the lungs every 6 (six) hours as needed for wheezing or shortness of breath. Gladis, Mary-Margaret, FNP  Active   ALPRAZolam  (XANAX ) 1 MG tablet [id number]  Take 1 tablet (1 mg total) by mouth at bedtime as needed for anxiety. Okey Barnie SAUNDERS, MD  Active   ARIPiprazole  (ABILIFY ) 2 MG tablet [id number]  Take 1 tablet (2 mg total) by mouth daily. Okey Barnie SAUNDERS, MD  Active   atorvastatin  (LIPITOR) 40 MG tablet [id number]  Take 1 tablet (40 mg total) by mouth daily. Joesph Annabella HERO, FNP  Active   Blood Glucose  Monitoring Suppl (ACCU-CHEK GUIDE ME) w/Device KIT [id number]  Use daily Dx E11.9 Joesph Annabella HERO, FNP  Active   clobetasol  ointment (TEMOVATE ) 0.05 % [id number]  Pea-sized amount to affected area as needed- at most nightly Ozan, Jennifer, DO  Active   cycloSPORINE (RESTASIS) 0.05 % ophthalmic emulsion [id number]  Place 1 drop into both eyes at bedtime. [provider]  Active Self, Pharmacy Records  dicyclomine  (BENTYL ) 10 MG capsule [id number]  Take 1 capsule (10 mg total) by mouth 3 (three) times daily as needed for spasms. Carlan, Chelsea L, NP  Active   Dulaglutide  (TRULICITY ) 0.75 MG/0.5ML SOAJ [id number]  Inject 0.75 mg into the skin once a week. Joesph Annabella HERO, FNP  Active   empagliflozin  (JARDIANCE ) 25 MG TABS tablet [id number]  Take 1 tablet (25 mg total) by mouth daily. Joesph Annabella HERO, FNP  Active   fluticasone -salmeterol (ADVAIR HFA) 230-21 MCG/ACT inhaler [id number]  Inhale 2 puffs into the lungs 2 (two) times daily. Gladis, Mary-Margaret, FNP  Active   glucose blood (ACCU-CHEK GUIDE TEST) test strip [id number]  Use daily Dx E11.9 Joesph Annabella HERO, FNP  Active   HYDROcodone -acetaminophen  [facility]) 10-325 MG tablet [id number]  Take 1 tablet by mouth 3 (three) times daily as needed for pain. [provider]  Active Self, Pharmacy Records  latanoprost (XALATAN) 0.005 % ophthalmic solution [id number]  Place 1 drop  into both eyes at bedtime. [provider]  Active Self, Pharmacy Records  linaclotide  (LINZESS ) 290 MCG CAPS capsule [id number]  Take 1 capsule (290 mcg total) by mouth daily before breakfast. Castaneda Mayorga, Daniel, MD  Active   lisinopril  (ZESTRIL ) 5 MG tablet [id number]  Take 0.5 tablets (2.5 mg total) by mouth daily. Joesph Annabella HERO, FNP  Active   metFORMIN  (GLUCOPHAGE ) 1000 MG tablet [id number]  Take 1 tablet (1,000 mg total) by mouth 2 (two) times daily with a meal. Joesph Annabella HERO, FNP  Active   omeprazole  (PRILOSEC) 40 MG capsule [id number]  Take 40  mg by mouth daily. [provider]  Active   ondansetron  (ZOFRAN ) 4 MG tablet [id number]  Take 1 tablet (4 mg total) by mouth every 8 (eight) hours as needed for nausea or vomiting. Joesph Annabella HERO, FNP  Active   pantoprazole  (PROTONIX ) 40 MG tablet [id number]  Take 40 mg by mouth daily. [provider]  Active   venlafaxine  XR (EFFEXOR -XR) 150 MG 24 hr capsule [id number]  Take 2 capsules (300 mg total) by mouth at bedtime. Okey Barnie SAUNDERS, MD  Active   vitamin B-12 (CYANOCOBALAMIN ) 100 MCG tablet [id number]  Take 1 tablet (100 mcg total) by mouth daily. Castaneda Mayorga, Daniel, MD  Active             Recommendation:   Continue Current Plan of Care  Follow Up Plan:   Telephone follow-up in 1 month  Rosina Forte, BSN RN [facility], [facility] RN Care Manager Direct Dial: [phone]  Fax: [phone]

## 2024-03-15 ENCOUNTER — Ambulatory Visit: Payer: Self-pay | Admitting: Family Medicine

## 2024-03-15 ENCOUNTER — Encounter: Payer: Self-pay | Admitting: Family Medicine

## 2024-03-15 VITALS — BP 125/80 | HR 95 | Ht 59.0 in | Wt 118.0 lb

## 2024-03-15 DIAGNOSIS — Z7985 Long-term (current) use of injectable non-insulin antidiabetic drugs: Secondary | ICD-10-CM | POA: Diagnosis not present

## 2024-03-15 DIAGNOSIS — J4489 Other specified chronic obstructive pulmonary disease: Secondary | ICD-10-CM

## 2024-03-15 DIAGNOSIS — G8929 Other chronic pain: Secondary | ICD-10-CM

## 2024-03-15 DIAGNOSIS — E1169 Type 2 diabetes mellitus with other specified complication: Secondary | ICD-10-CM | POA: Diagnosis not present

## 2024-03-15 DIAGNOSIS — Z7984 Long term (current) use of oral hypoglycemic drugs: Secondary | ICD-10-CM | POA: Diagnosis not present

## 2024-03-15 DIAGNOSIS — R109 Unspecified abdominal pain: Secondary | ICD-10-CM

## 2024-03-15 DIAGNOSIS — F411 Generalized anxiety disorder: Secondary | ICD-10-CM | POA: Diagnosis not present

## 2024-03-15 DIAGNOSIS — E1142 Type 2 diabetes mellitus with diabetic polyneuropathy: Secondary | ICD-10-CM | POA: Diagnosis not present

## 2024-03-15 DIAGNOSIS — E785 Hyperlipidemia, unspecified: Secondary | ICD-10-CM

## 2024-03-15 DIAGNOSIS — I152 Hypertension secondary to endocrine disorders: Secondary | ICD-10-CM | POA: Diagnosis not present

## 2024-03-15 DIAGNOSIS — F321 Major depressive disorder, single episode, moderate: Secondary | ICD-10-CM

## 2024-03-15 DIAGNOSIS — E1159 Type 2 diabetes mellitus with other circulatory complications: Secondary | ICD-10-CM

## 2024-03-15 LAB — CMP14+EGFR
ALT: 12 IU/L (ref 0–32)
AST: 12 IU/L (ref 0–40)
Albumin: 3.9 g/dL (ref 3.9–4.9)
Alkaline Phosphatase: 66 IU/L (ref 49–135)
BUN/Creatinine Ratio: 18 (ref 12–28)
BUN: 11 mg/dL (ref 8–27)
Bilirubin Total: 0.2 mg/dL (ref 0.0–1.2)
CO2: 23 mmol/L (ref 20–29)
Calcium: 9.7 mg/dL (ref 8.7–10.3)
Chloride: 103 mmol/L (ref 96–106)
Creatinine, Ser: 0.61 mg/dL (ref 0.57–1.00)
Globulin, Total: 2.5 g/dL (ref 1.5–4.5)
Glucose: 148 mg/dL — ABNORMAL HIGH (ref 70–99)
Potassium: 3.7 mmol/L (ref 3.5–5.2)
Sodium: 141 mmol/L (ref 134–144)
Total Protein: 6.4 g/dL (ref 6.0–8.5)
eGFR: 100 mL/min/1.73

## 2024-03-15 LAB — BAYER DCA HB A1C WAIVED: HB A1C (BAYER DCA - WAIVED): 7.5 % — ABNORMAL HIGH (ref 4.8–5.6)

## 2024-03-15 NOTE — Progress Notes (Signed)
 "  Established Patient Office Visit  Subjective   Patient ID: Charlotte Perez, female    DOB: [birth year]  Age: 63 y.o. MRN: [id number]  Chief Complaint  Patient presents with   Medical Management of Chronic Issues   Diabetes    Diabetes    History of Present Illness   Charlotte Perez is a 63 year old female with type 2 diabetes mellitus who presents for follow-up of her blood sugar management.  Glycemic control - Blood glucose levels fluctuate from 110s to 250s in the morning - Currently taking Trulicity  1.5 mg, Jardiance , and metformin  - Tolerates Trulicity  well at current dosage - Hemoglobin A1c is 7.5 - Admits diet could improve - She has ordered a book to help with diet for diabetes  Abdominal pain - Abdominal pain is well controlled after adjustment of gastrointestinal medications - No nausea, vomiting, or diarrhea  Respiratory symptoms - COPD is stable - Compliant with Advair - Uses albuterol  approximately once per week - No chest pain or shortness of breath  Cardiovascular risk factors - Blood pressure is at goal - Hyperlipidemia is at goal with statin therapy     Depression and anxiety - Report this time of year is difficult for her.  - Overall stable.  - Denies SI.      03/15/2024    9:58 AM 03/12/2024   10:55 AM 02/13/2024    1:48 PM  Depression screen PHQ 2/9  Decreased Interest 3 0 1  Down, Depressed, Hopeless 1 1 1   PHQ - 2 Score 4 1 2   Altered sleeping 3  1  Tired, decreased energy 2  1  Change in appetite 2  0  Feeling bad or failure about yourself  2  1  Trouble concentrating 2  1  Moving slowly or fidgety/restless 1  1  Suicidal thoughts 0  0  PHQ-9 Score 16  7  Difficult doing work/chores Somewhat difficult  Somewhat difficult      03/15/2024    9:58 AM 03/12/2024   10:55 AM 02/13/2024    1:48 PM  Depression screen PHQ 2/9  Decreased Interest 3 0 1  Down, Depressed, Hopeless 1 1 1   PHQ - 2 Score 4 1 2   Altered sleeping 3  1   Tired, decreased energy 2  1  Change in appetite 2  0  Feeling bad or failure about yourself  2  1  Trouble concentrating 2  1  Moving slowly or fidgety/restless 1  1  Suicidal thoughts 0  0  PHQ-9 Score 16  7  Difficult doing work/chores Somewhat difficult  Somewhat difficult      ROS    Objective:     BP 125/80   Pulse 95   Ht 4' 11 (1.499 m)   Wt 118 lb (53.5 kg)   SpO2 97%   BMI 23.83 kg/m  Wt Readings from Last 3 Encounters:  03/15/24 118 lb (53.5 kg)  02/13/24 122 lb (55.3 kg)  02/09/24 122 lb 9.6 oz (55.6 kg)      Physical Exam Vitals and nursing note reviewed.  Constitutional:      General: She is not in acute distress.    Appearance: Normal appearance. She is not ill-appearing, toxic-appearing or diaphoretic.  Cardiovascular:     Rate and Rhythm: Normal rate and regular rhythm.     Pulses: Normal pulses.     Heart sounds: Normal heart sounds. No murmur heard. Pulmonary:     Effort: Pulmonary  effort is normal. No respiratory distress.     Breath sounds: Normal breath sounds.  Abdominal:     General: Bowel sounds are normal.     Palpations: Abdomen is soft.     Tenderness: There is generalized abdominal tenderness. There is no guarding or rebound.  Musculoskeletal:     Cervical back: No edema. No spinous process tenderness or muscular tenderness.     Right lower leg: No edema.     Left lower leg: No edema.  Skin:    General: Skin is warm and dry.  Neurological:     General: No focal deficit present.     Mental Status: She is alert and oriented to person, place, and time.  Psychiatric:        Mood and Affect: Mood normal.        Behavior: Behavior normal.        Thought Content: Thought content normal.        Judgment: Judgment normal.      No results found for any visits on 03/15/24.    The 10-year ASCVD risk score (Arnett DK, et al., 2019) is: 11.5%    Assessment & Plan:   Charlotte Perez was seen today for medical management of chronic  issues and diabetes.  Diagnoses and all orders for this visit:  Type 2 diabetes mellitus with diabetic polyneuropathy, without long-term current use of insulin (HCC) -     Bayer DCA Hb A1c Waived -     CMP14+EGFR -     Microalbumin / creatinine urine ratio  Hypertension associated with diabetes (HCC)  Hyperlipidemia associated with type 2 diabetes mellitus (HCC)  Chronic abdominal pain  COPD with asthma (HCC)  Moderate single current episode of major depressive disorder (HCC)  GAD (generalized anxiety disorder)   Assessment and Plan    Type 2 diabetes mellitus with hyperglycemia -  A1c is 7.5, above target. Prefers dietary management over medication changes. - Continue Trulicity  1.5 mg, Jardiance , and metformin . - Encouraged dietary management. Encouraged exercsie.   Hyperlipidemia associated with type 2 diabetes mellitus Lipid panel at goal on statin therapy.  HTN assoc with T2DM BP at goal.   Chronic obstructive pulmonary disease with asthma Stable. Compliant with Advair and uses albuterol  approximately once a week. - Continue Advair and albuterol  as needed.  Chronic abdominal pain Reports well controlled currently.   Depression and anxiety Not well controlled. Denies SI. Continue follow up with Dr. Okey.        Return in about 3 months (around 06/13/2024) for chronic follow up.   The patient indicates understanding of these issues and agrees with the plan.  Charlotte CHRISTELLA Search, FNP "

## 2024-03-16 ENCOUNTER — Telehealth: Payer: Self-pay

## 2024-03-16 LAB — MICROALBUMIN / CREATININE URINE RATIO
Creatinine, Urine: 41.3 mg/dL
Microalb/Creat Ratio: 10 mg/g{creat} (ref 0–29)
Microalbumin, Urine: 4.2 ug/mL

## 2024-03-16 NOTE — Patient Instructions (Signed)
 Charlotte Perez - I am sorry I was unable to reach you today for our scheduled appointment. I work with Joesph Annabella HERO, FNP and am calling to support your healthcare needs. Please contact me at [phone] at your earliest convenience. I look forward to speaking with you soon.   Thank you,  Tillman Gardener, BSW [place]  [facility], [facility] Social Worker Direct Dial: [phone]  Fax: [phone] Website: [email or web address]

## 2024-03-19 ENCOUNTER — Other Ambulatory Visit: Payer: Self-pay

## 2024-03-19 NOTE — Patient Outreach (Signed)
 Social Drivers of Health  Community Resource and Care Coordination Visit Note   03/19/2024  Name: Charlotte Perez MRN: [id number] DOB:27-Jun-1960  Situation: Referral received for [facility] needs assessment and assistance related to Financial Strain . I obtained verbal consent from Patient.  Visit completed with Patient on the phone.   Background:   SDOH Interventions Today    Flowsheet Row Most Recent Value  SDOH Interventions   Financial Strain Interventions Community Resources Provided  [Patient has not followed up schedling with Deaf and Hard of Hearing provider. Patient can't locate paperwork and will look at a later time.]     Assessment:   Goals Addressed             This Visit's Progress    BSW Goals       Current SDOH Barriers:  Financial constraints related to purchasing hearing aids.  Interventions: Patient interviewed and appropriate screenings performed Referred patient to community resources  Patient has not followed up with paperwork for Deaf and Hard of Hearing.  Patient forgot to address with provider during office visit.  Patient is unsure of exactly what the paperwork requests she complete and can not locate the information.  Patient will locate the paperwork for the next visit.          Recommendation:   Patient will locate paperwork from Deaf and Hard of Hearing to determine next steps.  Follow Up Plan:   Telephone follow up appointment date/time:  03/22/24 at 11am  Tillman Gardener, BSW [place]  [facility], [facility] Social Worker Direct Dial: [phone]  Fax: [phone] Website: [email or web address]

## 2024-03-19 NOTE — Patient Instructions (Signed)
 Visit Information  Thank you for taking time to visit with me today. Please don't hesitate to contact me if I can be of assistance to you before our next scheduled appointment.  Your next care management appointment is by telephone on 03/22/24 at 11am  Please call the care guide team at [phone] if you need to cancel, schedule, or reschedule an appointment.   Please call 911 if you are experiencing a Mental Health or Behavioral Health Crisis or need someone to talk to.  Tillman Gardener, BSW [place]  [facility], [facility] Social Worker Direct Dial: [phone]  Fax: [phone] Website: [email or web address]

## 2024-03-22 ENCOUNTER — Telehealth: Payer: Self-pay

## 2024-03-22 ENCOUNTER — Ambulatory Visit: Payer: Self-pay | Admitting: Family Medicine

## 2024-03-29 ENCOUNTER — Telehealth: Payer: Self-pay

## 2024-03-29 NOTE — Patient Instructions (Signed)
 [name] - I am sorry I was unable to reach you today for our scheduled appointment. I work with Joesph Annabella HERO, FNP and am calling to support your healthcare needs. Please contact me at [phone] at your earliest convenience. I look forward to speaking with you soon.   Thank you,  Tillman Gardener, BSW [place]  [facility], [facility] Social Worker Direct Dial: [phone]  Fax: [phone] Website: [email or web address]

## 2024-03-30 ENCOUNTER — Telehealth: Payer: Self-pay

## 2024-03-30 ENCOUNTER — Ambulatory Visit (INDEPENDENT_AMBULATORY_CARE_PROVIDER_SITE_OTHER): Admitting: Gastroenterology

## 2024-03-30 ENCOUNTER — Encounter (INDEPENDENT_AMBULATORY_CARE_PROVIDER_SITE_OTHER): Payer: Self-pay | Admitting: Gastroenterology

## 2024-03-30 VITALS — BP 129/77 | HR 103 | Temp 97.8°F | Ht 59.0 in | Wt 121.4 lb

## 2024-03-30 DIAGNOSIS — R110 Nausea: Secondary | ICD-10-CM | POA: Diagnosis not present

## 2024-03-30 DIAGNOSIS — R1013 Epigastric pain: Secondary | ICD-10-CM

## 2024-03-30 DIAGNOSIS — K589 Irritable bowel syndrome without diarrhea: Secondary | ICD-10-CM | POA: Diagnosis not present

## 2024-03-30 DIAGNOSIS — K219 Gastro-esophageal reflux disease without esophagitis: Secondary | ICD-10-CM | POA: Diagnosis not present

## 2024-03-30 DIAGNOSIS — K2940 Chronic atrophic gastritis without bleeding: Secondary | ICD-10-CM | POA: Diagnosis not present

## 2024-03-30 DIAGNOSIS — K581 Irritable bowel syndrome with constipation: Secondary | ICD-10-CM

## 2024-03-30 DIAGNOSIS — R7989 Other specified abnormal findings of blood chemistry: Secondary | ICD-10-CM

## 2024-03-30 MED ORDER — PROMETHAZINE HCL 25 MG PO TABS: 25.0000 mg | ORAL_TABLET | Freq: Four times a day (QID) | ORAL | 1 refills | Status: AC | PRN

## 2024-03-30 NOTE — Progress Notes (Signed)
 "  Referring Provider: Joesph Annabella HERO, FNP Primary Care Physician:  Joesph Annabella HERO, FNP Primary GI Physician: Previously DR. Castaneda (Dr. Cinderella)   Chief Complaint  Patient presents with   Follow-up    Patient here today for a follow up on IBS with constipation and abdominal pain. Patient says this is better since starting linzess  290 mcg daily. She has some issues at times with nausea, and says she takes promethazine  and bentyl  prn nausea.     HPI:   Charlotte Perez is a 64 y.o. female with past medical history of asthma, ADD, bipolar disorder, COPD, diabetes, depression, IBS-C, chronic opiate use   Patient presenting today for:  Follow up of IBS, GERD, autoimmune gastritis, nausea, epigastric pain   Last seen September, at that time having ongoing Epigastric pain, carafate  improved symptoms some.   Recommended EGD, continue omeprazole , bentyl  10mg  TID PRN, continue linzes 290mcg daily mediterranean diet, good weight management   EGD 10/1--large amount of food debris in stomach Repeat EGD 10/7 - 1 cm hiatal hernia.                           - Normal stomach. Biopsied.                           - Normal examined duodenum. Biopsied.  A. SMALL BOWEL, BIOPSY: - Duodenal mucosa with mild intraepithelial villous lymphocytosis with preserved villous architecture, see comment. - Negative for dysplasia and malignancy.  B. GASTRIC, BIOPSY: - Antral-type mucosa with glandular dropout, moderate chronic gastritis, and focal intestinal metaplasia, see comment. - Antral mucosa with mild chronic and reactive gastritis. - IHC for H. pylori will be reported in an addendum. - Negative for dysplasia and malignancy.   Recommended repeat EGD 2 years, H pylori breath test, IgG, IgM, IgA celiac panel, B12 and AIF, AP cell Ab  Labs 10/17: IgG 878 IgM 56 Celiac panel negative B12 184  Intrinsic factor 1 AP Ab 229.3  H pylori negative  Due to suspected AIh gasttritis, patient started  on B12 supplementation  CMP on 03/15/24 grossly unremarkable  CBC in November grossly unremarkable   Present:  Feeling much better, has had minimal abdominal discomfort, some mild abdominal discomfort and nausea this AM. Appetite Is very good. She is using bentyl  and phenergan  as needed for abdominal discomfort and nausea   Constipation is well controlled on linzess  daily. Having a BM usually about every 3 days but going pretty easily most days. Trying to drink about 1.5 glasses over a 48 hour period.  No rectal bleeding or melena.   GERD is well controlled on omeprazole . Very occasional dysphagia sometimes with medications or thicker meats   She was sent for  GES but could not complete the study as she could not eat the eggs.   CT A/P w contrast: 10/2023. Hepatic steatosis. 2. Sigmoid diverticulosis. 3. Postoperative changes within the lower lumbar spine. Last Colonoscopy: 07/21/2015, performed at [facility] by Dr. Donnel.  Found to have small external and internal hemorrhoids, mild diverticulosis in the sigmoid colon.  Recommended to repeat in 10 years.   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   Arthritis    Asthma    Bipolar disorder (HCC)  Chronic back pain    Chronic neck pain    Constipation 09/05/2016   COPD (chronic obstructive pulmonary disease) (HCC)    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   DDD (degenerative disc disease), cervical    DDD (degenerative disc disease), lumbar    Depression    Diabetes mellitus (HCC) 06/27/2010   Diabetes mellitus, type II (HCC)    Gallbladder sludge    GERD (gastroesophageal reflux disease)    Hyperlipidemia    Hypertension    Iron deficiency anemia 09/05/2016   Lumbar radiculopathy    Mole (skin)    Neuropathy of foot    Renal cyst    Shingles    Skin lesions, generalized    1.2  CM FLAT FAWN COLOR AT THE T10 AREA JUST RIGHT OF SPINE     Past Surgical History:  Procedure Laterality Date   ABDOMINAL HYSTERECTOMY     total hysterectomy   BACK SURGERY     BIOPSY  03/07/2021   Procedure: BIOPSY;  Surgeon: Eartha Angelia Sieving, MD;  [facility];  Service: Gastroenterology;;   CESAREAN SECTION     x 2   ESOPHAGOGASTRODUODENOSCOPY N/A 12/24/2023   Procedure: EGD (ESOPHAGOGASTRODUODENOSCOPY);  Surgeon: Eartha Angelia, Sieving, MD;  Location[facility];  Service: Gastroenterology;  Laterality: N/A;  9:45am, asa 2   ESOPHAGOGASTRODUODENOSCOPY N/A 12/30/2023   Procedure: EGD (ESOPHAGOGASTRODUODENOSCOPY);  Surgeon: Eartha Angelia, Sieving, MD;  [facility];  Service: Gastroenterology;  Laterality: N/A;  12:30pm, ASA 2   ESOPHAGOGASTRODUODENOSCOPY (EGD) WITH PROPOFOL  N/A 03/07/2021   Procedure: ESOPHAGOGASTRODUODENOSCOPY (EGD) WITH PROPOFOL ;  Surgeon: Eartha Angelia Sieving, MD;  [facility];  Service: Gastroenterology;  Laterality: N/A;  9:15   NECK SURGERY      Current Outpatient Medications  Medication Sig Dispense Refill   ACCU-CHEK FASTCLIX LANCETS MISC EVERY DAY 102 each 2   albuterol  (PROVENTIL ) (2.5 MG/3ML) 0.083% nebulizer solution Take 3 mLs (2.5 mg total) by nebulization every 6 (six) hours as needed for wheezing or shortness of breath. 75 mL 3   albuterol  (VENTOLIN  HFA) 108 (90 Base) MCG/ACT inhaler Inhale 2 puffs into the lungs every 6 (six) hours as needed for wheezing or shortness of breath. 8 g 0   ALPRAZolam  (XANAX ) 1 MG tablet Take 1 tablet (1 mg total) by mouth at bedtime as needed for anxiety. 30 tablet 2   ARIPiprazole  (ABILIFY ) 2 MG tablet Take 1 tablet (2 mg total) by mouth daily. 30 tablet 2   atorvastatin  (LIPITOR) 40 MG tablet Take 1 tablet (40 mg total) by mouth daily. 90 tablet 3   Blood Glucose Monitoring Suppl (ACCU-CHEK GUIDE ME) w/Device KIT Use daily Dx E11.9 1 kit 0   clobetasol  ointment  (TEMOVATE ) 0.05 % Pea-sized amount to affected area as needed- at most nightly 30 g 1   cycloSPORINE (RESTASIS) 0.05 % ophthalmic emulsion Place 1 drop into both eyes at bedtime.     dicyclomine  (BENTYL ) 10 MG capsule Take 10 mg by mouth 3 (three) times daily as needed for spasms.     Dulaglutide  (TRULICITY ) 0.75 MG/0.5ML SOAJ Inject 0.75 mg into the skin once a week. 6 mL 3   empagliflozin  (JARDIANCE ) 25 MG TABS tablet Take 1 tablet (25 mg total) by mouth daily. 90 tablet 3   fluticasone -salmeterol (ADVAIR HFA) 230-21 MCG/ACT inhaler Inhale 2 puffs into the lungs 2 (two) times daily. 12 g 3   glucose blood (ACCU-CHEK GUIDE TEST) test strip Use daily Dx E11.9 100 each  3   HYDROcodone -acetaminophen  (NORCO) 10-325 MG tablet Take 1 tablet by mouth 3 (three) times daily as needed for pain.     latanoprost (XALATAN) 0.005 % ophthalmic solution Place 1 drop into both eyes at bedtime.     linaclotide  (LINZESS ) 290 MCG CAPS capsule Take 1 capsule (290 mcg total) by mouth daily before breakfast. 90 capsule 3   lisinopril  (ZESTRIL ) 5 MG tablet Take 0.5 tablets (2.5 mg total) by mouth daily. 90 tablet 3   metFORMIN  (GLUCOPHAGE ) 1000 MG tablet Take 1 tablet (1,000 mg total) by mouth 2 (two) times daily with a meal. 180 tablet 3   omeprazole  (PRILOSEC) 40 MG capsule Take 40 mg by mouth daily.     promethazine  (PHENERGAN ) 25 MG tablet Take 25 mg by mouth every 6 (six) hours as needed.     venlafaxine  XR (EFFEXOR -XR) 150 MG 24 hr capsule Take 2 capsules (300 mg total) by mouth at bedtime. 60 capsule 2   vitamin B-12 (CYANOCOBALAMIN ) 100 MCG tablet Take 1 tablet (100 mcg total) by mouth daily. 90 tablet 3   No current facility-administered medications for this visit.    Allergies as of 03/30/2024 - Review Complete 03/30/2024  Allergen Reaction Noted   Cymbalta  [duloxetine  hcl]  07/27/2014   Gabapentin  06/27/2010   Meloxicam  Other (See Comments) 12/26/2014   Tramadol  Other (See Comments) 12/26/2014     Social History   Socioeconomic History   Marital status: Married    Spouse name: Ronnie   Number of children: 3   Years of education: 9   Highest education level: 9th grade  Occupational History   Occupation: disablility  Tobacco Use   Smoking status: Never    Passive exposure: Yes   Smokeless tobacco: Never  Vaping Use   Vaping status: Never Used  Substance and Sexual Activity   Alcohol use: No   Drug use: No   Sexual activity: Not Currently    Birth control/protection: Surgical    Comment: hyst  Other Topics Concern   Not on file  Social History Narrative   Not on file   Social Drivers of Health   Tobacco Use: Medium Risk (03/30/2024)   Patient History    Smoking Tobacco Use: Never    Smokeless Tobacco Use: Never    Passive Exposure: Yes  Financial Resource Strain: Medium Risk (02/20/2024)   Overall Financial Resource Strain (CARDIA)    Difficulty of Paying Living Expenses: Somewhat hard  Food Insecurity: Food Insecurity Present (02/20/2024)   Epic    Worried About Programme Researcher, Broadcasting/film/video in the Last Year: Never true    Ran Out of Food in the Last Year: Sometimes true  Transportation Needs: No Transportation Needs (02/20/2024)   Epic    Lack of Transportation (Medical): No    Lack of Transportation (Non-Medical): No  Physical Activity: Insufficiently Active (07/10/2022)   Exercise Vital Sign    Days of Exercise per Week: 2 days    Minutes of Exercise per Session: 20 min  Stress: Stress Concern Present (07/10/2022)   [employer] of Occupational Health - Occupational Stress Questionnaire    Feeling of Stress : To some extent  Social Connections: Socially Isolated (07/10/2022)   Social Connection and Isolation Panel    Frequency of Communication with Friends and Family: Once a week    Frequency of Social Gatherings with Friends and Family: Never    Attends Religious Services: Never    Database Administrator or Organizations: No  Attends Tax Inspector Meetings: Never    Marital Status: Married  Depression (PHQ2-9): High Risk (03/15/2024)   Depression (PHQ2-9)    PHQ-2 Score: 16  Alcohol Screen: Low Risk (07/10/2022)   Alcohol Screen    Last Alcohol Screening Score (AUDIT): 0  Housing: Unknown (02/20/2024)   Epic    Unable to Pay for Housing in the Last Year: No    Number of Times Moved in the Last Year: Not on file    Homeless in the Last Year: No  Utilities: Not At Risk (02/20/2024)   Epic    Threatened with loss of utilities: No  Health Literacy: Adequate Health Literacy (12/02/2023)   B1300 Health Literacy    Frequency of need for help with medical instructions: Rarely    Review of systems General: negative for malaise, night sweats, fever, chills, weight loss Neck: Negative for lumps, goiter, pain and significant neck swelling Resp: Negative for cough, wheezing, dyspnea at rest CV: Negative for chest pain, leg swelling, palpitations, orthopnea GI: denies melena, hematochezia, vomiting, diarrhea, constipation, dysphagia, odyonophagia, early satiety or unintentional weight loss. +nausea  MSK: Negative for joint pain or swelling, back pain, and muscle pain. Derm: Negative for itching or rash Psych: Denies depression, anxiety, memory loss, confusion. No homicidal or suicidal ideation.  Heme: Negative for prolonged bleeding, bruising easily, and swollen nodes. Endocrine: Negative for cold or heat intolerance, polyuria, polydipsia and goiter. Neuro: negative for tremor, gait imbalance, syncope and seizures. The remainder of the review of systems is noncontributory.  Physical Exam: BP 129/77 (BP Location: Right Arm, Patient Position: Sitting, Cuff Size: Normal)   Pulse (!) 103   Temp 97.8 F (36.6 C) (Temporal)   Ht 4' 11 (1.499 m)   Wt 121 lb 6.4 oz (55.1 kg)   BMI 24.52 kg/m  General:   Alert and oriented. No distress noted. Pleasant and cooperative.  Head:  Normocephalic and atraumatic. Eyes:  Conjuctiva  clear without scleral icterus. Mouth:  Oral mucosa pink and moist. Good dentition. No lesions. Heart: Normal rate and rhythm, s1 and s2 heart sounds present.  Lungs: Clear lung sounds in all lobes. Respirations equal and unlabored. Abdomen:  +BS, soft,  and non-distended. Mild TTP of epigastric area. No rebound or guarding. No HSM or masses noted. Derm: No palmar erythema or jaundice Msk:  Symmetrical without gross deformities. Normal posture. Extremities:  Without edema. Neurologic:  Alert and  oriented x4 Psych:  Alert and cooperative. Normal mood and affect.  Invalid input(s): 6 MONTHS   ASSESSMENT: Charlotte Perez is a 64 y.o. female presenting today for follow up of Autoimmune gastritis, epigastric pain, nausea, GERD and IBS  GERD: well controlled on omeprazole . Occasional dysphagia but she reports this is not an ongoing issue. Recent EGD with small HH, no esophageal abnormalities, suspect we are likely dealing with some aspect of dysmotility especially in setting of possible gastroparesis. Will keep an eye on symptoms, consider BPE if dysphagia worsens.   IBS: mostly well controlled on linzess  290mcg daily. Water intake is low. Taking bentyl  as needed for abdominal discomfort which helps. Recommended to increase water intake as she is able which will help with her constipation as well.   Autoimmune gastritis/nausea/epigastric pain: recent EGD as above,  B12 levels low. She was started on supplemental B12. She is feeling better, Still with some epigastric pain and nausea though less significant than previously. She was unable to tolerate GES to evaluate for gastroparesis due to large food debris  found on initial EGD. Will continue with B12 supplementation, and recheck B12 levels, encouraged her to eat smaller meals throughout the day to help if we do in fact have underlying gastroparesis.    PLAN:  -recheck B12 levels -continue b12 supplementation -continue linzess  290mcg  daily -increase water intake  -continue bentyl  10mg  PRN -continue omeprazole  40mg  daily -consider BPE if dysphagia worsens  -repeat EGD and colonoscopy due in 2027   All questions were answered, patient verbalized understanding and is in agreement with plan as outlined above.   Follow Up: 6 months   Patty Lopezgarcia L. Chrysta Fulcher, MSN, APRN, AGNP-C Adult-Gerontology Nurse Practitioner [facility]  "

## 2024-03-30 NOTE — Patient Instructions (Signed)
-  recheck B12 levels -continue b12 supplementation -continue linzess  290mcg daily -continue bentyl  10mg  as needed -phenergan  as needed for nausea -try to focus on 4-5 smaller meals per day vs. 2-3 larger -Increase water intake, aim for atleast 64 oz per day -continue omeprazole  40mg  daily -repeat EGD and colonoscopy due in 2027   Follow up 6 months  It was a pleasure to see you today. I want to create trusting relationships with patients and provide genuine, compassionate, and quality care. I truly value your feedback! please be on the lookout for a survey regarding your visit with me today. I appreciate your input about our visit and your time in completing this!    Rayven Hendrickson L. Kenedie Dirocco, MSN, APRN, AGNP-C Adult-Gerontology Nurse Practitioner [facility] at [facility]

## 2024-03-30 NOTE — Patient Instructions (Signed)
 [name] - I am sorry I was unable to reach you today for our scheduled appointment. I work with Joesph Annabella HERO, FNP and am calling to support your healthcare needs. Please contact me at [phone] at your earliest convenience. I look forward to speaking with you soon.   Thank you,  Tillman Gardener, BSW [place]  [facility], [facility] Social Worker Direct Dial: [phone]  Fax: [phone] Website: [email or web address]

## 2024-03-31 ENCOUNTER — Ambulatory Visit (INDEPENDENT_AMBULATORY_CARE_PROVIDER_SITE_OTHER): Payer: Self-pay | Admitting: Gastroenterology

## 2024-03-31 LAB — VITAMIN B12: Vitamin B-12: 234 pg/mL (ref 200–1100)

## 2024-04-01 ENCOUNTER — Other Ambulatory Visit: Payer: Self-pay

## 2024-04-01 NOTE — Patient Outreach (Signed)
 Social Drivers of Health  Community Resource and Care Coordination Visit Note   04/01/2024  Name: Charlotte Perez MRN: [id number] DOB:October 02, 1960  Situation: Referral received for [facility] needs assessment and assistance related to Hearing Aid. I obtained verbal consent from Patient.  Visit completed with Patient on the phone.   Background:      Assessment:   Goals Addressed             This Visit's Progress    BSW Goals       Current SDOH Barriers:  Financial constraints related to purchasing hearing aids.  Interventions: Patient interviewed and appropriate screenings performed Referred patient to community resources  Patient has located the paperwork for Deaf and Hard of Hearing.  Patient is not at home.  Patient has been struggling with medical issues (both herself and spouse) and was seen by a doctor yesterday.  Patient agreed to have the paperwork available for the next appointment to call to schedule hearing assessment.          Recommendation:   Pt will locate and have forms available from Deaf and Hard of Hearing at next visit.  Follow Up Plan:   Telephone follow up appointment date/time:  04/02/24 at 9:30am  Tillman Gardener, BSW [place]  [facility], [place] [facility] Social Worker Direct Dial: [phone]  Fax: [phone] Website: [email or web address]

## 2024-04-01 NOTE — Patient Instructions (Signed)
 Visit Information  Thank you for taking time to visit with me today. Please don't hesitate to contact me if I can be of assistance to you before our next scheduled appointment.  Your next care management appointment is by telephone on 04/02/24 at 9:30am   Please call the care guide team at [phone] if you need to cancel, schedule, or reschedule an appointment.   Please call 911 if you are experiencing a Mental Health or Behavioral Health Crisis or need someone to talk to.  Tillman Gardener, BSW [place]  [facility], [facility] Social Worker Direct Dial: [phone]  Fax: [phone] Website: [email or web address]

## 2024-04-02 ENCOUNTER — Telehealth: Payer: Self-pay

## 2024-04-02 NOTE — Patient Instructions (Signed)
 [name] - I am sorry I was unable to reach you today for our scheduled appointment. I work with Joesph Annabella HERO, FNP and am calling to support your healthcare needs. Please contact me at [phone] at your earliest convenience. I look forward to speaking with you soon.   Thank you,  Tillman Gardener, BSW [place]  [facility], [facility] Social Worker Direct Dial: [phone]  Fax: [phone] Website: [email or web address]

## 2024-04-06 ENCOUNTER — Other Ambulatory Visit: Payer: Self-pay

## 2024-04-06 NOTE — Patient Outreach (Signed)
 Social Drivers of Health  Community Resource and Care Coordination Visit Note   04/06/2024  Name: Charlotte Perez MRN: [id number] DOB:09-May-1960  Situation: Referral received for [facility] needs assessment and assistance related to Hearing Aid. I obtained verbal consent from Patient.  Visit completed with Patient on the phone.   Background:   SDOH Interventions Today    Flowsheet Row Most Recent Value  SDOH Interventions   Financial Strain Interventions Other (Comment)  [Pt struggles to afford hearing aid.  Pt spoke to Deaf and harding of hearing and informed of the providers she needs to contact for assessment.]     Assessment:   Goals Addressed             This Visit's Progress    BSW Goals       Current SDOH Barriers:  Financial constraints related to purchasing hearing aids.  Interventions: Patient interviewed and appropriate screenings performed Referred patient to community resources  Patient has a list of providers and will contact them next week.  Pt spoke directly with Deaf and Hard of Hearing on 04/05/24 and understands what her next step in the process.  Pt does not need support calling the providers to schedule. SW is unable to update assessment as patient is at the doctors office.          Recommendation:   Pt to follow up with providers for hearing assessment.  Follow Up Plan:   Telephone follow up appointment date/time:  04/20/24 at 2pm  Tillman Gardener, BSW   [facility], [facility] Social Worker Direct Dial: [phone]  Fax: [phone] Website: [email or web address]

## 2024-04-06 NOTE — Patient Instructions (Signed)
 Visit Information  Thank you for taking time to visit with me today. Please don't hesitate to contact me if I can be of assistance to you before our next scheduled appointment.  Your next care management appointment is by telephone on 04/20/24 at 2pm   Please call the care guide team at [phone] if you need to cancel, schedule, or reschedule an appointment.   Please call 911 if you are experiencing a Mental Health or Behavioral Health Crisis or need someone to talk to.  Tillman Gardener, BSW [place]  [facility], [facility] Social Worker Direct Dial: [phone]  Fax: [phone] Website: [email or web address]

## 2024-04-12 ENCOUNTER — Other Ambulatory Visit: Payer: Self-pay | Admitting: *Deleted

## 2024-04-12 NOTE — Patient Instructions (Signed)
 Visit Information  Charlotte Perez was given information about Medicaid Managed Care team care coordination services as a part of their [facility] Community Plan Medicaid benefit.   If you would like to schedule transportation through your [facility], please call the following number at least 2 days in advance of your appointment: [phone]   Rides for urgent appointments can also be made after hours by calling Member Services.  Call the Behavioral Health Crisis Line at [phone], at any time, 24 hours a day, 7 days a week. If you are in danger or need immediate medical attention call 911.  Please see education materials related to Diabetes, COPD provided by MyChart link.  Patient verbalizes understanding of instructions and care plan provided today and agrees to view in MyChart. Active MyChart status and patient understanding of how to access instructions and care plan via MyChart confirmed with patient.     Telephone follow up appointment with Managed Medicaid care management team member scheduled for:05-13-2024 at 11:00 am  Rosina Forte, BSN RN [facility], [facility] RN Care Manager Direct Dial: [phone]  Fax: [phone]   Following is a copy of your plan of care:   Goals Addressed             This Visit's Progress    VBCI RN Care Plan - COPD   On track    Problems:  Chronic Disease Management support and education needs related to COPD  Goal: Over the next 90 days the Patient will attend all scheduled medical appointments: with primary care provider and specialist as evidenced by keeping all scheduled appointments        continue to work with RN Care Manager and/or Social Worker to address care management and care coordination needs related to COPD as evidenced by adherence to care management team scheduled appointments     take all medications exactly as prescribed and will call provider for medication  related questions as evidenced by compliance with all medications    verbalize basic understanding of COPD disease process and self health management plan as evidenced by verbal explanation, recognizing/monitoring symptoms, lifestyle modifications  Interventions:   COPD Interventions: Advised patient to engage in light exercise as tolerated 3-5 days a week to aid in the the management of COPD. Reports no acute issues or changes. Advised patient to track and manage COPD triggers Advised patient to self assesses COPD action plan zone and make appointment with provider if in the yellow zone for 48 hours without improvement. Action plan reviewed with patient and copy provided in AVS. Assessed social determinant of health barriers Discussed the importance of adequate rest and management of fatigue with COPD Provided education about and advised patient to utilize infection prevention strategies to reduce risk of respiratory infection Provided instruction about proper use of medications used for management of COPD including inhalers. Reports compliance with all medications Provided patient with basic written and verbal COPD education on self care/management/and exacerbation prevention. Reports no acute change with COPD at this time. She does endorse second hand smoking from her husband in the home. Provided written and verbal instructions on pursed lip breathing and utilized returned demonstration as teach back Screening for signs and symptoms of depression related to chronic disease state. Pt. Reports depression and has an appt. With her therapist  Patient Self-Care Activities:  Attend all scheduled provider appointments Call provider office for new concerns or questions  eliminate smoking in my home begin a symptom  diary follow rescue plan if symptoms flare-up  Plan:  Telephone follow up appointment with care management team member scheduled for:  05-13-2024 at 11:00 am          VBCI RN Care  Plan - DM   On track    Problems:  Chronic Disease Management support and education needs related to DMII  Goal: Over the next 90 days the Patient will attend all scheduled medical appointments: with primary care provider and specialist as evidenced by keeping all scheduled appointments        continue to work with RN Care Manager and/or Social Worker to address care management and care coordination needs related to DMII as evidenced by adherence to care management team scheduled appointments     take all medications exactly as prescribed and will call provider for medication related questions as evidenced by compliance with all medications    verbalize basic understanding of DMII disease process and self health management plan as evidenced by verbal explanation, recognizing/monitoring symptoms, lifestyle modifications  Interventions:   Diabetes Interventions: Assessed patient's understanding of A1c goal: <7% Provided education to patient about basic DM disease process. A1C increased. Reports checking blood sugar daily. Fasting: 110  Highest: 199, Lowest: 90. RNCM reviewed with patient goal fasting <130 post prandial <180. Discussed with patient the importance of being complaint with dietary recommendations. Reviewed medications with patient and discussed importance of medication adherence. Counseled on importance of regular laboratory monitoring as prescribed. Labs up to date Discussed plans with patient for ongoing care management follow up and provided patient with direct contact information for care management team Provided patient with written educational materials related to hypo and hyperglycemia and importance of correct treatment Reviewed scheduled/upcoming provider appointments including: 06-14-24 with PCP Advised patient, providing education and rationale, to check cbg daily and record, calling provider for findings outside established parameters Review of patient status, including  review of consultants reports, relevant laboratory and other test results, and medications completed Screening for signs and symptoms of depression related to chronic disease state  Assessed social determinant of health barriers Lab Results  Component Value Date   HGBA1C 7.5 (H) 03/15/2024    Patient Self-Care Activities:  Attend all scheduled provider appointments Call pharmacy for medication refills 3-7 days in advance of running out of medications Call provider office for new concerns or questions  Take medications as prescribed   schedule appointment with eye doctor check blood sugar at prescribed times: once daily check feet daily for cuts, sores or redness drink 6 to 8 glasses of water each day eat fish at least once per week fill half of plate with vegetables limit fast food meals to no more than 1 per week manage portion size switch to low-fat or skim milk switch to sugar-free drinks wash and dry feet carefully every day  Plan:  Telephone follow up appointment with care management team member scheduled for:  05-13-2024  at 11:00 am

## 2024-04-12 NOTE — Patient Outreach (Signed)
 Complex Care Management   Visit Note  04/12/2024  Name:  Charlotte Perez MRN: [id number] DOB: 11/03/1960  Situation: Referral received for Complex Care Management related to COPD and Diabetes with Complications I obtained verbal consent from Patient.  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   DDD (degenerative disc disease), cervical    DDD (degenerative disc disease), lumbar    Depression    Diabetes mellitus (HCC) 06/27/2010   Diabetes mellitus, type II (HCC)    Gallbladder sludge    GERD (gastroesophageal reflux disease)    Hyperlipidemia    Hypertension    Iron deficiency anemia 09/05/2016   Lumbar radiculopathy    Mole (skin)    Neuropathy of foot    Renal cyst    Shingles    Skin lesions, generalized    1.2 CM FLAT FAWN COLOR AT THE T10 AREA JUST RIGHT OF SPINE     Assessment: Patient Reported Symptoms:  Cognitive Cognitive Status: No symptoms reported Cognitive/Intellectual Conditions Management [RPT]: None reported or documented in medical history or problem list   Health Maintenance Behaviors: Annual physical exam Healing Pattern: Average Health Facilitated by: Rest  Neurological Neurological Review of Symptoms: Headaches Neurological Management Strategies: Routine screening Neurological Self-Management Outcome: 3 (uncertain)  HEENT HEENT Symptoms Reported: Nasal discharge HEENT Management Strategies: Routine screening HEENT Self-Management Outcome: 4 (good)    Cardiovascular Cardiovascular Symptoms Reported: No symptoms reported Cardiovascular Management Strategies: Routine screening Cardiovascular Self-Management Outcome:  4 (good)  Respiratory Respiratory Symptoms Reported: Shortness of breath Respiratory Management Strategies: Routine screening Respiratory Self-Management Outcome: 4 (good)  Endocrine Endocrine Symptoms Reported: No symptoms reported Is patient diabetic?: Yes Is patient checking blood sugars at home?: Yes List most recent blood sugar readings, include date and time of day: 04-12-24 110 Endocrine Self-Management Outcome: 4 (good)  Gastrointestinal Gastrointestinal Symptoms Reported: No symptoms reported Gastrointestinal Self-Management Outcome: 4 (good)    Genitourinary Genitourinary Symptoms Reported: No symptoms reported Genitourinary Self-Management Outcome: 4 (good)  Integumentary Integumentary Symptoms Reported: No symptoms reported Skin Self-Management Outcome: 4 (good)  Musculoskeletal Musculoskelatal Symptoms Reviewed: Joint pain, Back pain Musculoskeletal Management Strategies: Routine screening, Medication therapy Musculoskeletal Self-Management Outcome: 3 (uncertain) Falls in the past year?: No Number of falls in past year: 1 or less Was there an injury with Fall?: No Fall Risk Category Calculator: 0 Patient Fall Risk Level: Low Fall Risk Patient at Risk for Falls Due to: No Fall Risks Fall risk Follow up: Falls evaluation completed  Psychosocial Psychosocial Symptoms Reported: No symptoms reported Behavioral Management Strategies: Coping strategies Behavioral Health Self-Management Outcome: 4 (good) Major Change/Loss/Stressor/Fears (CP): Denies Techniques to Cope with Loss/Stress/Change: Diversional activities      04/12/2024    PHQ2-9 Depression Screening   Little interest or pleasure in doing things Not at all  Feeling down, depressed, or hopeless Several days  PHQ-2 - Total Score 1  Trouble falling or staying asleep, or sleeping too much    Feeling tired or having little energy    Poor appetite or overeating     Feeling bad about yourself - or that you are a  failure or  have let yourself or your family down    Trouble concentrating on things, such as reading the newspaper or watching television    Moving or speaking so slowly that other people could have noticed.  Or the opposite - being so fidgety or restless that you have been moving around a lot more than usual    Thoughts that you would be better off dead, or hurting yourself in some way    PHQ2-9 Total Score    If you checked off any problems, how difficult have these problems made it for you to do your work, take care of things at home, or get along with other people    Depression Interventions/Treatment      There were no vitals filed for this visit. Pain Score: 0-No pain  Medications Reviewed Today     Reviewed by Bertrum Rosina HERO, RN (Registered Nurse) on 04/12/24 at 1053  Med List Status: <None>   Medication Order Taking? Sig Documenting Provider Last Dose Status Informant  JANIS GONG LANCETS MISC [id number]  EVERY DAY Lavell Bari LABOR, FNP  Active Self, Pharmacy Records  albuterol  (PROVENTIL ) (2.5 MG/3ML) 0.083% nebulizer solution [id number]  Take 3 mLs (2.5 mg total) by nebulization every 6 (six) hours as needed for wheezing or shortness of breath. Joesph Annabella HERO, FNP  Active   albuterol  (VENTOLIN  HFA) 108 (90 Base) MCG/ACT inhaler [id number]  Inhale 2 puffs into the lungs every 6 (six) hours as needed for wheezing or shortness of breath. Gladis, Mary-Margaret, FNP  Active   ALPRAZolam  (XANAX ) 1 MG tablet [id number]  Take 1 tablet (1 mg total) by mouth at bedtime as needed for anxiety. Okey Barnie SAUNDERS, MD  Active   ARIPiprazole  (ABILIFY ) 2 MG tablet [id number]  Take 1 tablet (2 mg total) by mouth daily. Okey Barnie SAUNDERS, MD  Active   atorvastatin  (LIPITOR) 40 MG tablet [id number]  Take 1 tablet (40 mg total) by mouth daily. Joesph Annabella HERO, FNP  Active   Blood Glucose Monitoring Suppl (ACCU-CHEK GUIDE ME) w/Device KIT [id number]  Use daily Dx E11.9 Joesph Annabella HERO, FNP  Active    clobetasol  ointment (TEMOVATE ) 0.05 % [id number]  Pea-sized amount to affected area as needed- at most nightly Ozan, Jennifer, DO  Active   cycloSPORINE (RESTASIS) 0.05 % ophthalmic emulsion [id number]  Place 1 drop into both eyes at bedtime. [provider]  Active Self, Pharmacy Records  dicyclomine  (BENTYL ) 10 MG capsule [id number]  Take 10 mg by mouth 3 (three) times daily as needed for spasms. [provider]  Active   Dulaglutide  (TRULICITY ) 0.75 MG/0.5ML SOAJ [id number]  Inject 0.75 mg into the skin once a week. Joesph Annabella HERO, FNP  Active   empagliflozin  (JARDIANCE ) 25 MG TABS tablet [id number]  Take 1 tablet (25 mg total) by mouth daily. Joesph Annabella HERO, FNP  Active   fluticasone -salmeterol (ADVAIR HFA) 230-21 MCG/ACT inhaler [id number]  Inhale 2 puffs into the lungs 2 (two) times daily. Gladis, Mary-Margaret, FNP  Active   glucose blood (ACCU-CHEK GUIDE TEST) test strip [id number]  Use daily Dx E11.9 Joesph Annabella HERO, FNP  Active   HYDROcodone -acetaminophen  [facility]) 10-325 MG tablet [id number]  Take 1 tablet by mouth 3 (three) times daily as needed for pain. [provider]  Active Self, Pharmacy Records  latanoprost (XALATAN) 0.005 % ophthalmic solution [id number]  Place 1 drop into both eyes at bedtime. [provider]  Active Self, Pharmacy Records  linaclotide  (LINZESS ) 290 MCG CAPS capsule [id number]  Take 1  capsule (290 mcg total) by mouth daily before breakfast. Eartha Flavors, Daniel, MD  Active   lisinopril  (ZESTRIL ) 5 MG tablet [id number]  Take 0.5 tablets (2.5 mg total) by mouth daily. Joesph Annabella HERO, FNP  Active   metFORMIN  (GLUCOPHAGE ) 1000 MG tablet [id number]  Take 1 tablet (1,000 mg total) by mouth 2 (two) times daily with a meal. Joesph Annabella HERO, FNP  Active   omeprazole  (PRILOSEC) 40 MG capsule [id number]  Take 40 mg by mouth daily. [provider]  Active   promethazine  (PHENERGAN ) 25 MG tablet [id number]  Take 1 tablet (25 mg  total) by mouth every 6 (six) hours as needed. Carlan, Chelsea L, NP  Active   venlafaxine  XR (EFFEXOR -XR) 150 MG 24 hr capsule [id number]  Take 2 capsules (300 mg total) by mouth at bedtime. Okey Barnie SAUNDERS, MD  Active   vitamin B-12 (CYANOCOBALAMIN ) 100 MCG tablet [id number]  Take 1 tablet (100 mcg total) by mouth daily. Castaneda Mayorga, Daniel, MD  Active             Recommendation:   Continue Current Plan of Care  Follow Up Plan:   Telephone follow-up in 1 month  Rosina Forte, BSN RN [facility], [facility] Health RN Care Manager Direct Dial: [phone]  Fax: [phone]

## 2024-04-19 ENCOUNTER — Encounter (HOSPITAL_COMMUNITY): Payer: Self-pay | Admitting: Psychiatry

## 2024-04-19 ENCOUNTER — Telehealth (HOSPITAL_COMMUNITY): Admitting: Psychiatry

## 2024-04-19 DIAGNOSIS — F321 Major depressive disorder, single episode, moderate: Secondary | ICD-10-CM | POA: Diagnosis not present

## 2024-04-19 DIAGNOSIS — F411 Generalized anxiety disorder: Secondary | ICD-10-CM

## 2024-04-19 MED ORDER — ALPRAZOLAM 1 MG PO TABS: 1.0000 mg | ORAL_TABLET | Freq: Every evening | ORAL | 2 refills | Status: AC | PRN

## 2024-04-19 MED ORDER — VENLAFAXINE HCL ER 150 MG PO CP24: 300.0000 mg | ORAL_CAPSULE | Freq: Every day | ORAL | 2 refills | Status: AC

## 2024-04-19 NOTE — Progress Notes (Signed)
 Virtual Visit via Telephone Note  I connected with Charlotte Perez on 04/19/24 at  9:20 AM EST by telephone and verified that I am speaking with the correct person using two identifiers.  Location: Patient: home Provider: office   I discussed the limitations, risks, security and privacy concerns of performing an evaluation and management service by telephone and the availability of in person appointments. I also discussed with the patient that there may be a patient responsible charge related to this service. The patient expressed understanding and agreed to proceed.       I discussed the assessment and treatment plan with the patient. The patient was provided an opportunity to ask questions and all were answered. The patient agreed with the plan and demonstrated an understanding of the instructions.   The patient was advised to call back or seek an in-person evaluation if the symptoms worsen or if the condition fails to improve as anticipated.  I provided 20 minutes of non-face-to-face time during this encounter.   Barnie Gull, MD  [facility] MD/PA/NP OP Progress Note  04/19/2024 9:59 AM Charlotte Perez  MRN:  [id number]  Chief Complaint:  Chief Complaint  Patient presents with   Depression   Anxiety   Follow-up   HPI: This patient is a 64 year old married white female who lives with her husband in [place]. She is on disability.   The patient returns for follow-up after 3 months regarding her depression and anxiety.  She states that she had a pretty good Christmas and had a good visit with her older sister.  She is trying to help her husband who also has depression in addition to early onset dementia.  She states for the most part they are getting along pretty well.  I tried adding Abilify  to the patient's regimen last time but she claims she never got it.  She states that she is doing okay without it she thinks her current regiment is working well and she denies any thoughts of  self-harm or suicide. Visit Diagnosis:    ICD-10-CM   1. Moderate single current episode of major depressive disorder (HCC)  F32.1     2. 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   DDD (degenerative disc disease), cervical    DDD (degenerative disc disease), lumbar    Depression    Diabetes mellitus (HCC) 06/27/2010   Diabetes mellitus, type II (HCC)    Gallbladder sludge    GERD (gastroesophageal reflux disease)    Hyperlipidemia    Hypertension    Iron deficiency anemia 09/05/2016   Lumbar radiculopathy    Mole (skin)    Neuropathy of foot    Renal cyst    Shingles    Skin lesions, generalized    1.2 CM FLAT FAWN COLOR AT THE T10 AREA JUST RIGHT OF SPINE     Past Surgical History:  Procedure Laterality Date   ABDOMINAL HYSTERECTOMY     total hysterectomy   BACK SURGERY     BIOPSY  03/07/2021   Procedure: BIOPSY;  Surgeon:  Eartha Angelia Sieving, MD;  [facility];  Service: Gastroenterology;;   CESAREAN SECTION     x 2   ESOPHAGOGASTRODUODENOSCOPY N/A 12/24/2023   Procedure: EGD (ESOPHAGOGASTRODUODENOSCOPY);  Surgeon: Eartha Angelia, Sieving, MD;  [facility]: [facility];  Service: Gastroenterology;  Laterality: N/A;  9:45am, asa 2   ESOPHAGOGASTRODUODENOSCOPY N/A 12/30/2023   Procedure: EGD (ESOPHAGOGASTRODUODENOSCOPY);  Surgeon: Eartha Angelia, Sieving, MD;  [facility];  Service: Gastroenterology;  Laterality: N/A;  12:30pm, ASA 2   ESOPHAGOGASTRODUODENOSCOPY (EGD) WITH PROPOFOL  N/A 03/07/2021   Procedure: ESOPHAGOGASTRODUODENOSCOPY  (EGD) WITH PROPOFOL ;  Surgeon: Eartha Angelia Sieving, MD;  [facility];  Service: Gastroenterology;  Laterality: N/A;  9:15   NECK SURGERY      Family Psychiatric History: See below  Family History:  Family History  Problem Relation Age of Onset   ADD / ADHD Other    COPD Father    Alcohol abuse Father    Depression Daughter    Alcohol abuse Mother    COPD Mother        on oxygen   Colon cancer Paternal Grandfather    Heart disease Sister    Heart disease Brother    Arthritis Sister        knee replacement   Diabetes Maternal Grandmother    Diabetes Sister     Social History:  Social History   Socioeconomic History   Marital status: Married    Spouse name: Ronnie   Number of children: 3   Years of education: 9   Highest education level: 9th grade  Occupational History   Occupation: disablility  Tobacco Use   Smoking status: Never    Passive exposure: Yes   Smokeless tobacco: Never  Vaping Use   Vaping status: Never Used  Substance and Sexual Activity   Alcohol use: No   Drug use: No   Sexual activity: Not Currently    Birth control/protection: Surgical    Comment: hyst  Other Topics Concern   Not on file  Social History Narrative   Not on file   Social Drivers of Health   Tobacco Use: Medium Risk (04/19/2024)   Patient History    Smoking Tobacco Use: Never    Smokeless Tobacco Use: Never    Passive Exposure: Yes  Financial Resource Strain: Medium Risk (02/20/2024)   Overall Financial Resource Strain (CARDIA)    Difficulty of Paying Living Expenses: Somewhat hard  Food Insecurity: Food Insecurity Present (02/20/2024)   Epic    Worried About Programme Researcher, Broadcasting/film/video in the Last Year: Never true    Ran Out of Food in the Last Year: Sometimes true  Transportation Needs: No Transportation Needs (02/20/2024)   Epic    Lack of Transportation (Medical): No    Lack of Transportation (Non-Medical): No  Physical Activity: Insufficiently Active  (07/10/2022)   Exercise Vital Sign    Days of Exercise per Week: 2 days    Minutes of Exercise per Session: 20 min  Stress: Stress Concern Present (07/10/2022)   [employer] of Occupational Health - Occupational Stress Questionnaire    Feeling of Stress : To some extent  Social Connections: Socially Isolated (07/10/2022)   Social Connection and Isolation Panel    Frequency of Communication with Friends and Family: Once a week    Frequency of Social Gatherings with Friends and Family: Never    Attends Religious Services: Never    Database Administrator or Organizations: No    Attends  Club or Organization Meetings: Never    Marital Status: Married  Depression (PHQ2-9): Low Risk (04/12/2024)   Depression (PHQ2-9)    PHQ-2 Score: 1  Recent Concern: Depression (PHQ2-9) - High Risk (03/15/2024)   Depression (PHQ2-9)    PHQ-2 Score: 16  Alcohol Screen: Low Risk (07/10/2022)   Alcohol Screen    Last Alcohol Screening Score (AUDIT): 0  Housing: Unknown (02/20/2024)   Epic    Unable to Pay for Housing in the Last Year: No    Number of Times Moved in the Last Year: Not on file    Homeless in the Last Year: No  Utilities: Not At Risk (02/20/2024)   Epic    Threatened with loss of utilities: No  Health Literacy: Adequate Health Literacy (12/02/2023)   B1300 Health Literacy    Frequency of need for help with medical instructions: Rarely    Allergies: Allergies[1]  Metabolic Disorder Labs: Lab Results  Component Value Date   HGBA1C 7.5 (H) 03/15/2024   No results found for: PROLACTIN Lab Results  Component Value Date   CHOL 168 09/12/2023   TRIG 112 09/12/2023   HDL 70 09/12/2023   CHOLHDL 2.4 09/12/2023   LDLCALC 78 09/12/2023   LDLCALC 77 02/07/2022   Lab Results  Component Value Date   TSH 0.706 09/12/2023   TSH 0.644 08/26/2022    Therapeutic Level Labs: No results found for: LITHIUM No results found for: VALPROATE No results found for: CBMZ  Current  Medications: Current Outpatient Medications  Medication Sig Dispense Refill   ACCU-CHEK FASTCLIX LANCETS MISC EVERY DAY 102 each 2   albuterol  (PROVENTIL ) (2.5 MG/3ML) 0.083% nebulizer solution Take 3 mLs (2.5 mg total) by nebulization every 6 (six) hours as needed for wheezing or shortness of breath. 75 mL 3   albuterol  (VENTOLIN  HFA) 108 (90 Base) MCG/ACT inhaler Inhale 2 puffs into the lungs every 6 (six) hours as needed for wheezing or shortness of breath. 8 g 0   ALPRAZolam  (XANAX ) 1 MG tablet Take 1 tablet (1 mg total) by mouth at bedtime as needed for anxiety. 30 tablet 2   atorvastatin  (LIPITOR) 40 MG tablet Take 1 tablet (40 mg total) by mouth daily. 90 tablet 3   Blood Glucose Monitoring Suppl (ACCU-CHEK GUIDE ME) w/Device KIT Use daily Dx E11.9 1 kit 0   clobetasol  ointment (TEMOVATE ) 0.05 % Pea-sized amount to affected area as needed- at most nightly 30 g 1   cycloSPORINE (RESTASIS) 0.05 % ophthalmic emulsion Place 1 drop into both eyes at bedtime.     dicyclomine  (BENTYL ) 10 MG capsule Take 10 mg by mouth 3 (three) times daily as needed for spasms.     Dulaglutide  (TRULICITY ) 0.75 MG/0.5ML SOAJ Inject 0.75 mg into the skin once a week. 6 mL 3   empagliflozin  (JARDIANCE ) 25 MG TABS tablet Take 1 tablet (25 mg total) by mouth daily. 90 tablet 3   fluticasone -salmeterol (ADVAIR HFA) 230-21 MCG/ACT inhaler Inhale 2 puffs into the lungs 2 (two) times daily. 12 g 3   glucose blood (ACCU-CHEK GUIDE TEST) test strip Use daily Dx E11.9 100 each 3   HYDROcodone -acetaminophen  (NORCO) 10-325 MG tablet Take 1 tablet by mouth 3 (three) times daily as needed for pain.     latanoprost (XALATAN) 0.005 % ophthalmic solution Place 1 drop into both eyes at bedtime.     linaclotide  (LINZESS ) 290 MCG CAPS capsule Take 1 capsule (290 mcg total) by mouth daily before breakfast. 90 capsule 3  lisinopril  (ZESTRIL ) 5 MG tablet Take 0.5 tablets (2.5 mg total) by mouth daily. 90 tablet 3   metFORMIN   (GLUCOPHAGE ) 1000 MG tablet Take 1 tablet (1,000 mg total) by mouth 2 (two) times daily with a meal. 180 tablet 3   omeprazole  (PRILOSEC) 40 MG capsule Take 40 mg by mouth daily.     promethazine  (PHENERGAN ) 25 MG tablet Take 1 tablet (25 mg total) by mouth every 6 (six) hours as needed. 30 tablet 1   venlafaxine  XR (EFFEXOR -XR) 150 MG 24 hr capsule Take 2 capsules (300 mg total) by mouth at bedtime. 60 capsule 2   vitamin B-12 (CYANOCOBALAMIN ) 100 MCG tablet Take 1 tablet (100 mcg total) by mouth daily. 90 tablet 3   No current facility-administered medications for this visit.     Musculoskeletal: Strength & Muscle Tone: na Gait & Station: na Patient leans: N/A  Psychiatric Specialty Exam: Review of Systems  All other systems reviewed and are negative.   There were no vitals taken for this visit.There is no height or weight on file to calculate BMI.  General Appearance: NA  Eye Contact:  NA  Speech:  Clear and Coherent  Volume:  Normal  Mood:  Euthymic  Affect:  Congruent  Thought Process:  Goal Directed  Orientation:  Full (Time, Place, and Person)  Thought Content: WDL   Suicidal Thoughts:  No  Homicidal Thoughts:  No  Memory:  Immediate;   Good Recent;   Good Remote;   NA  Judgement:  Good  Insight:  Fair  Psychomotor Activity:  Normal  Concentration:  Concentration: Good and Attention Span: Good  Recall:  Good  Fund of Knowledge: Good  Language: Good  Akathisia:  No  Handed:  Right  AIMS (if indicated): not done  Assets:  Communication Skills Desire for Improvement Resilience Social Support  ADL's:  Intact  Cognition: WNL  Sleep:  Good   Screenings: GAD-7    Flowsheet Row Office Visit from 03/15/2024 in [facility] Office Visit from 02/09/2024 in [facility] Office Visit from 12/15/2023 in [facility] Visit from 11/07/2023 in [facility] Visit from 10/22/2023 in [facility]  Total GAD-7 Score 10 11 5 10 7    PHQ2-9    Flowsheet Row Patient Outreach Telephone from 04/12/2024 in [place] [facility] Office Visit from 03/15/2024 in [facility] Patient Outreach Telephone from 03/12/2024 in [place] [facility] Patient Outreach Telephone from 02/13/2024 in [place] [facility] Office Visit from 02/09/2024 in [facility]  PHQ-2 Total Score 1 4 1 2 6   PHQ-9 Total Score -- 16 -- 7 18   Flowsheet Row Admission (Discharged) from 12/30/2023 in [facility] Admission (Discharged) from 12/24/2023 in [facility] ED from 11/03/2023 in [facility] Department [facility]  C-SSRS RISK CATEGORY No Risk No Risk No Risk     Assessment and Plan: This patient is a 64 year old female with a history of major depression and generalized anxiety disorder.  She is doing well on her current regimen.  She will continue Effexor  XR 300 mg daily for major depression and Xanax  1 mg daily as needed for anxiety.  She will return to see me in 3 months  Collaboration of Care: Collaboration of Care: Primary Care Provider AEB notes are  shared with PCP on the epic system  Patient/Guardian was advised Release of Information must be obtained prior to any record release in order to collaborate their care with an outside provider. Patient/Guardian was advised if they have not already done so to contact the registration department to sign all necessary forms in order for us  to release information regarding their care.   Consent: Patient/Guardian gives verbal consent for treatment and assignment of benefits for services provided during this visit. Patient/Guardian expressed understanding and agreed to proceed.    Barnie Gull, MD 04/19/2024, 9:59  AM     [1]  Allergies Allergen Reactions   Cymbalta  [Duloxetine  Hcl]     Per pt it started making her mouth tingle and could not taste anything   Gabapentin     Itching, fidgety   Meloxicam  Other (See Comments)    Tongue tingling, and lost sense of taste.   Tramadol  Other (See Comments)    Tongue tingling, and lost sense of taste.

## 2024-04-20 ENCOUNTER — Other Ambulatory Visit: Payer: Self-pay

## 2024-04-20 NOTE — Patient Instructions (Signed)
 Visit Information  Thank you for taking time to visit with me today. Please don't hesitate to contact me if I can be of assistance to you before our next scheduled appointment.  Your next care management appointment is by telephone on 04/29/24 at 1pm  Please call the care guide team at [phone] if you need to cancel, schedule, or reschedule an appointment.   Please call 911 if you are experiencing a Mental Health or Behavioral Health Crisis or need someone to talk to.  Tillman Gardener, BSW [place]  [facility], [facility] Social Worker Direct Dial: [phone]  Fax: [phone] Website: [email or web address]

## 2024-04-20 NOTE — Patient Outreach (Signed)
 Social Drivers of Health  Community Resource and Care Coordination Visit Note   04/20/2024  Name: Charlotte Perez MRN: [id number] DOB:1960-04-20  Situation: Referral received for [facility] needs assessment and assistance related to Financial Strain . I obtained verbal consent from Patient.  Visit completed with Patient on the phone.   Background:   SDOH Interventions Today    Flowsheet Row Most Recent Value  SDOH Interventions   Food Insecurity Interventions Intervention Not Indicated  Housing Interventions Intervention Not Indicated  Transportation Interventions Intervention Not Indicated  Utilities Interventions Intervention Not Indicated  Financial Strain Interventions Intervention Not Indicated  [Pt has not followed through with contacting Deaf and Hard of Hearing and can't locate paperwork.  SW t/c Deaf and Hard of Hearing and left message to contact pt.]     Assessment:   Goals Addressed             This Visit's Progress    BSW Goals       Current SDOH Barriers:  Financial constraints related to purchasing hearing aids.  Interventions: Patient interviewed and appropriate screenings performed Referred patient to community resources  Pt has not followed up with provides for assessment to apply for assistance with the Deaf and hard of Hearing for hearing aid assistance.  Patient can't locate the paperwork with the contact numbers.  SW t/c Deaf and Hard of Hearing and left message to contact the patient with the contact information.   SW encouraged pt to follow up with providers immediately when she is provided the contact information.          Recommendation:   Follow up with Deaf and hard of Hearing and providers for hearing assessment.  Follow Up Plan:   Telephone follow up appointment date/time:  04/29/24 at 1pm  Tillman Gardener, BSW [place]  [facility], [facility] Social Worker Direct Dial: [phone]  Fax: [phone] Website:  [email or web address]

## 2024-04-29 ENCOUNTER — Telehealth: Payer: Self-pay

## 2024-04-29 NOTE — Patient Instructions (Signed)
 Charlotte Perez - I am sorry I was unable to reach you today for our scheduled appointment. I work with Joesph Annabella HERO, FNP and am calling to support your healthcare needs. Please contact me at [phone] at your earliest convenience. I look forward to speaking with you soon.   Thank you,  Tillman Gardener, BSW [place]  [facility], [facility] Social Worker Direct Dial: [phone]  Fax: [phone] Website: [email or web address]

## 2024-05-04 ENCOUNTER — Telehealth

## 2024-05-13 ENCOUNTER — Telehealth: Admitting: *Deleted

## 2024-06-14 ENCOUNTER — Ambulatory Visit: Admitting: Family Medicine

## 2024-07-19 ENCOUNTER — Telehealth (HOSPITAL_COMMUNITY): Admitting: Psychiatry
